# Patient Record
Sex: Female | Born: 1935 | Race: White | Hispanic: No | Smoking: Former smoker
Health system: Southern US, Community
[De-identification: ages and names within clinical notes are randomized; demographics above are authoritative.]

## PROBLEM LIST (undated history)

## (undated) DIAGNOSIS — R7303 Prediabetes: Secondary | ICD-10-CM

## (undated) DIAGNOSIS — E039 Hypothyroidism, unspecified: Secondary | ICD-10-CM

## (undated) DIAGNOSIS — M722 Plantar fascial fibromatosis: Secondary | ICD-10-CM

## (undated) DIAGNOSIS — K5790 Diverticulosis of intestine, part unspecified, without perforation or abscess without bleeding: Secondary | ICD-10-CM

## (undated) DIAGNOSIS — F419 Anxiety disorder, unspecified: Secondary | ICD-10-CM

## (undated) DIAGNOSIS — G629 Polyneuropathy, unspecified: Secondary | ICD-10-CM

## (undated) DIAGNOSIS — M1990 Unspecified osteoarthritis, unspecified site: Secondary | ICD-10-CM

## (undated) DIAGNOSIS — E611 Iron deficiency: Secondary | ICD-10-CM

## (undated) DIAGNOSIS — K56609 Unspecified intestinal obstruction, unspecified as to partial versus complete obstruction: Secondary | ICD-10-CM

## (undated) DIAGNOSIS — I10 Essential (primary) hypertension: Secondary | ICD-10-CM

## (undated) DIAGNOSIS — D649 Anemia, unspecified: Secondary | ICD-10-CM

## (undated) DIAGNOSIS — C541 Malignant neoplasm of endometrium: Secondary | ICD-10-CM

## (undated) DIAGNOSIS — K635 Polyp of colon: Secondary | ICD-10-CM

## (undated) DIAGNOSIS — K219 Gastro-esophageal reflux disease without esophagitis: Secondary | ICD-10-CM

## (undated) DIAGNOSIS — N183 Chronic kidney disease, stage 3 unspecified: Secondary | ICD-10-CM

## (undated) HISTORY — DX: Plantar fascial fibromatosis: M72.2

## (undated) HISTORY — PX: CATARACT EXTRACTION: SUR2

## (undated) HISTORY — DX: Diverticulosis of intestine, part unspecified, without perforation or abscess without bleeding: K57.90

## (undated) HISTORY — PX: EYE SURGERY: SHX253

## (undated) HISTORY — DX: Prediabetes: R73.03

## (undated) HISTORY — DX: Iron deficiency: E61.1

## (undated) HISTORY — DX: Polyneuropathy, unspecified: G62.9

## (undated) HISTORY — DX: Anemia, unspecified: D64.9

## (undated) HISTORY — DX: Polyp of colon: K63.5

## (undated) HISTORY — DX: Hypothyroidism, unspecified: E03.9

## (undated) HISTORY — DX: Unspecified intestinal obstruction, unspecified as to partial versus complete obstruction: K56.609

## (undated) HISTORY — DX: Essential (primary) hypertension: I10

---

## 1958-06-11 HISTORY — PX: OTHER SURGICAL HISTORY: SHX169

## 1963-06-12 HISTORY — PX: OTHER SURGICAL HISTORY: SHX169

## 1999-02-15 ENCOUNTER — Other Ambulatory Visit: Admission: RE | Admit: 1999-02-15 | Discharge: 1999-02-15 | Payer: Self-pay | Admitting: Ophthalmology

## 1999-07-12 ENCOUNTER — Ambulatory Visit (HOSPITAL_COMMUNITY): Admission: RE | Admit: 1999-07-12 | Discharge: 1999-07-12 | Payer: Self-pay | Admitting: Internal Medicine

## 1999-07-12 ENCOUNTER — Encounter: Payer: Self-pay | Admitting: Internal Medicine

## 2000-10-08 ENCOUNTER — Other Ambulatory Visit: Admission: RE | Admit: 2000-10-08 | Discharge: 2000-10-08 | Payer: Self-pay | Admitting: Obstetrics and Gynecology

## 2001-02-27 ENCOUNTER — Ambulatory Visit (HOSPITAL_COMMUNITY): Admission: RE | Admit: 2001-02-27 | Discharge: 2001-02-27 | Payer: Self-pay | Admitting: Internal Medicine

## 2001-02-27 ENCOUNTER — Encounter: Payer: Self-pay | Admitting: Internal Medicine

## 2002-01-05 ENCOUNTER — Other Ambulatory Visit: Admission: RE | Admit: 2002-01-05 | Discharge: 2002-01-05 | Payer: Self-pay | Admitting: Obstetrics and Gynecology

## 2002-11-20 ENCOUNTER — Encounter: Payer: Self-pay | Admitting: Obstetrics and Gynecology

## 2002-11-20 ENCOUNTER — Ambulatory Visit (HOSPITAL_COMMUNITY): Admission: RE | Admit: 2002-11-20 | Discharge: 2002-11-20 | Payer: Self-pay | Admitting: Obstetrics and Gynecology

## 2003-08-11 ENCOUNTER — Ambulatory Visit (HOSPITAL_COMMUNITY): Admission: RE | Admit: 2003-08-11 | Discharge: 2003-08-12 | Payer: Self-pay | Admitting: Ophthalmology

## 2005-01-30 ENCOUNTER — Other Ambulatory Visit: Admission: RE | Admit: 2005-01-30 | Discharge: 2005-01-30 | Payer: Self-pay | Admitting: Obstetrics and Gynecology

## 2006-02-13 ENCOUNTER — Ambulatory Visit (HOSPITAL_COMMUNITY): Admission: RE | Admit: 2006-02-13 | Discharge: 2006-02-13 | Payer: Self-pay | Admitting: Internal Medicine

## 2008-12-06 ENCOUNTER — Ambulatory Visit (HOSPITAL_COMMUNITY): Admission: RE | Admit: 2008-12-06 | Discharge: 2008-12-06 | Payer: Self-pay | Admitting: Internal Medicine

## 2010-01-24 ENCOUNTER — Ambulatory Visit (HOSPITAL_COMMUNITY): Admission: RE | Admit: 2010-01-24 | Discharge: 2010-01-24 | Payer: Self-pay | Admitting: Internal Medicine

## 2010-07-03 ENCOUNTER — Encounter: Payer: Self-pay | Admitting: Internal Medicine

## 2010-09-20 ENCOUNTER — Ambulatory Visit: Payer: Self-pay | Admitting: Internal Medicine

## 2010-09-21 ENCOUNTER — Encounter: Payer: Self-pay | Admitting: Internal Medicine

## 2010-10-27 NOTE — Op Note (Signed)
NAME:  Mikayla Harrison, Mikayla Harrison                             ACCOUNT NO.:  [id number]   MEDICAL RECORD NO.:  [id number]                   PATIENT TYPE:  OIB   LOCATION:  5729                                 FACILITY:  [facility]   PHYSICIAN:  Alford Highland. Rankin, M.D.                DATE OF BIRTH:  17-Nov-1931   DATE OF PROCEDURE:  08/11/2003  DATE OF DISCHARGE:                                 OPERATIVE REPORT   PREOPERATIVE DIAGNOSIS:  Macular hole, stage 3, right eye.   POSTOPERATIVE DIAGNOSIS:  Macular hole, stage 3, right eye.   PROCEDURE:  1. Posterior vitrectomy, membrane peel of the limiting membrane, right eye.  2. Injection of vitreous substitute, SF6 20%.   SURGEON:  Alford Highland. Rankin, M.D.   ANESTHESIA:  General endotracheal anesthesia.   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  This is an attempt to improve the macular hole, close the macular hole and  allow for visual acuity to improve.  The patient understands the risks of  anesthesia including the rare occurrence of death but also to the eye  including but not limited hemorrhage, infection, scarring, need for further  surgery, no change in vision, loss of vision and progression of disease  despite intervention.   DESCRIPTION OF PROCEDURE:  After appropriate signed consent was obtained,  the patient was taken to the operating room.  In the operating room general  orotracheal anesthesia was induced without difficulty.  The right  periorbital region was sterilely prepped and draped in the usual ophthalmic  fashion.  The lid speculum was applied.  The conjunctival peritomy was then  fashioned temporally, supranasally.  A 4 mm infusion was secured 3 mm  posterior to the limbus in the inferotemporal quadrant.  Placement in the  vitreous cavity was verified visually.  The superior sclerotomy was then  placed.  The microscope was  positioned with the Biom attached.  Core  vitrectomy was then begun.  Iatrogenic posterior vitreous attachments were  created.  With active dissection, I entered through the optic nerve without  difficulty and this was elevated anterior to the equator 360 degrees and  trimmed.   At this time, attention was drawn to the perimacular region followed by Dorc  serrated forceps were then used to engage the entire limiting membrane.  This was elevated in a continuous circular tear fashion around the macular  region.  This was a second continuous tear to enlarge this area to increase  mobilization to the macular region was confirmed.  Typical faint widening of  the intraretinal surface confirmed that the macula had internal limiting  membrane removed.  No complications occurred.  Excellent mobility of the  edge of the macula were identified.  Fluid-air exchange completed.  Air  dissection of  SF6 20% exchange completed.  The instruments were removed from  the eye and superior sclerotomies closed with 7-0 Vicryl.  The infusion was  removed and similarly closed.  Conjunctiva closed with 7-0 Vicryl.  Subconjunctival injection of only antibiotic was used as the patient has a  steroid allergy.   Sterile Fox shield applied.  The patient was taken to the recovery room in  good general condition.  She tolerated the procedure well without  complications.                                               Alford Highland Rankin, M.D.    GAR/MEDQ  D:  08/11/2003  T:  08/12/2003  Job:  [id number]

## 2010-11-10 ENCOUNTER — Ambulatory Visit (INDEPENDENT_AMBULATORY_CARE_PROVIDER_SITE_OTHER): Payer: Medicare PPO | Admitting: Internal Medicine

## 2010-11-10 ENCOUNTER — Other Ambulatory Visit (INDEPENDENT_AMBULATORY_CARE_PROVIDER_SITE_OTHER): Payer: Medicare PPO

## 2010-11-10 ENCOUNTER — Encounter: Payer: Self-pay | Admitting: Internal Medicine

## 2010-11-10 VITALS — BP 126/72 | HR 64 | Ht 65.0 in | Wt 173.0 lb

## 2010-11-10 DIAGNOSIS — K219 Gastro-esophageal reflux disease without esophagitis: Secondary | ICD-10-CM

## 2010-11-10 DIAGNOSIS — D509 Iron deficiency anemia, unspecified: Secondary | ICD-10-CM

## 2010-11-10 DIAGNOSIS — Z9283 Personal history of failed moderate sedation: Secondary | ICD-10-CM

## 2010-11-10 DIAGNOSIS — Z79899 Other long term (current) drug therapy: Secondary | ICD-10-CM

## 2010-11-10 LAB — IBC PANEL
Iron: 56 ug/dL (ref 42–145)
Saturation Ratios: 17.2 % — ABNORMAL LOW (ref 20.0–50.0)
Transferrin: 232.7 mg/dL (ref 212.0–360.0)

## 2010-11-10 LAB — FERRITIN: Ferritin: 48.4 ng/mL (ref 10.0–291.0)

## 2010-11-10 LAB — VITAMIN B12: Vitamin B-12: 382 pg/mL (ref 211–911)

## 2010-11-10 LAB — FOLATE: Folate: >24.8 ng/mL (ref >5.9)

## 2010-11-10 NOTE — Patient Instructions (Signed)
Call our office after checking your schedule to schedule your Colonoscopy/Endoscopy with Pro[facility].

## 2010-11-10 NOTE — Progress Notes (Signed)
HISTORY OF PRESENT ILLNESS:  Mikayla Harrison is a 77 y.o. female with the below listed medical history who presents today regarding new onset iron deficiency anemia. The patient was seen on one occasion in September 2004 as a direct referral for screening colonoscopy. She was found to have pandiverticulosis and a diminutive non-adenomatous colon polyp which was removed. Review of outside laboratories finds a hemoglobin of 10.9 in November of 2001. This was down from a baseline of 11.5. B12 and ferritin levels as well as MCV were normal. Iron saturation was 17%. Followup hemoglobin in February 2012 was 10.9. Iron saturation 9%. Her GI review of systems is remarkable for intermittent heartburn for which she takes Gaviscon. The remainder of the GI review of systems is entirely normal. Specifically, no melena, hematochezia, weight loss, abdominal pain, dysphagia, or change in bowel habits. No family history of colon cancer. She does take NSAIDs chronically for arthritic pain. Generally, at least 400 mg of ibuprofen daily. Increased doses recently for dental pain. No recent Hemoccults performed. She also takes a baby aspirin daily  REVIEW OF SYSTEMS:  All non-GI ROS negative except for back pain, sore throat,  ankle swelling, sinus and allergy trouble  Past Medical History  Diagnosis Date  . Hypertension   . Hyperlipidemia   . Hypothyroidism   . Anemia   . Iron deficiency     Past Surgical History  Procedure Date  . Cataract extraction   . Vericose vein 1960    Social History Mikayla Harrison  reports that she has quit smoking. She has never used smokeless tobacco. She reports that she does not drink alcohol or use illicit drugs.  family history includes Colon polyps in her sister; Diabetes in her sister; and Heart disease in her brother, father, and mother.  Allergies  Allergen Reactions  . Codeine   . Prednisone        PHYSICAL EXAMINATION: Vital signs: BP 126/72  Pulse 64  Ht 5\' 5"  (1.651  m)  Wt 173 lb (78.472 kg)  BMI 28.79 kg/m2  Constitutional: generally well-appearing, overweight, no acute distress Psychiatric: alert and oriented x3, cooperative Eyes: extraocular movements intact, anicteric, conjunctiva pink Mouth: oral pharynx moist, no lesions Neck: supple no lymphadenopathy Cardiovascular: heart regular rate and rhythm, no murmur Lungs: clear to auscultation bilaterally Abdomen: soft, nontender, nondistended, no obvious ascites, no peritoneal signs, normal bowel sounds, no organomegaly Rectal: Deferred until colonoscopy Extremities: no lower extremity edema bilaterally Skin: no lesions on visible extremities Neuro: No focal deficits.    ASSESSMENT:  #1. Normocytic anemia with low iron saturation. Rule out iron deficiency. Prior colonoscopy as described. Rule out GI mucosal lesion such as colonic neoplasia, AVMs, or NSAIDs-induced mucosal injury #2. GERD   PLAN:  #1. Colonoscopy.The nature of the procedure, as well as the risks, benefits, and alternatives were carefully and thoroughly reviewed with the patient. Ample time for discussion and questions allowed. The patient understood, was satisfied, and agreed to proceed. Movi prep prescribed. The patient instructed on each use #2. Upper endoscopy.The nature of the procedure, as well as the risks, benefits, and alternatives were carefully and thoroughly reviewed with the patient. Ample time for discussion and questions allowed. The patient understood, was satisfied, and agreed to proceed.  #3. Anemia panel #4. If the above workup unrevealing, then consider capsule endoscopy and/or hematology evaluation #5. Reflux precautions

## 2010-11-13 ENCOUNTER — Telehealth: Payer: Self-pay

## 2010-11-13 NOTE — Telephone Encounter (Signed)
Pt aware of results per Dr. Perry. 

## 2010-11-13 NOTE — Telephone Encounter (Signed)
Message copied by Michele Mcalpine on Mon Nov 13, 2010  9:24 AM ------      Message from: Hilarie Fredrickson      Created: Fri Nov 10, 2010  6:29 PM       The patient noted that her blood work looks fine. Normal vitamin levels

## 2010-12-04 ENCOUNTER — Telehealth: Payer: Self-pay | Admitting: Internal Medicine

## 2010-12-06 NOTE — Telephone Encounter (Signed)
Pt scheduled for previsit on 12/14/10@1pm , Propofol endo/colon scheduled for 12/26/10@8 :30am. Pt aware of appt dates and times.

## 2010-12-14 ENCOUNTER — Encounter: Payer: Self-pay | Admitting: Internal Medicine

## 2010-12-14 ENCOUNTER — Ambulatory Visit (AMBULATORY_SURGERY_CENTER): Payer: Medicare PPO | Admitting: *Deleted

## 2010-12-14 VITALS — Ht 66.0 in | Wt 174.4 lb

## 2010-12-14 DIAGNOSIS — D509 Iron deficiency anemia, unspecified: Secondary | ICD-10-CM

## 2010-12-14 DIAGNOSIS — K219 Gastro-esophageal reflux disease without esophagitis: Secondary | ICD-10-CM

## 2010-12-14 MED ORDER — MOVIPREP 100 G PO SOLR: ORAL | Status: DC

## 2010-12-26 ENCOUNTER — Encounter: Payer: Medicare PPO | Admitting: Internal Medicine

## 2011-01-09 ENCOUNTER — Ambulatory Visit (AMBULATORY_SURGERY_CENTER): Payer: Medicare PPO | Admitting: Internal Medicine

## 2011-01-09 ENCOUNTER — Encounter: Payer: Self-pay | Admitting: Internal Medicine

## 2011-01-09 DIAGNOSIS — Z1211 Encounter for screening for malignant neoplasm of colon: Secondary | ICD-10-CM

## 2011-01-09 DIAGNOSIS — K219 Gastro-esophageal reflux disease without esophagitis: Secondary | ICD-10-CM

## 2011-01-09 DIAGNOSIS — D509 Iron deficiency anemia, unspecified: Secondary | ICD-10-CM

## 2011-01-09 DIAGNOSIS — D126 Benign neoplasm of colon, unspecified: Secondary | ICD-10-CM

## 2011-01-09 MED ORDER — SODIUM CHLORIDE 0.9 % IV SOLN: 500.0000 mL | INTRAVENOUS | Status: DC

## 2011-01-09 NOTE — Progress Notes (Signed)
No complaints on discharge, MAW

## 2011-01-09 NOTE — Patient Instructions (Signed)
Refer to the picture page for finding from the colonoscopy and the upper endoscopy.  Please follow the green and blue discharge instruction sheets the rest of the day.  Resume your prior medications today.  Call if any questions or concerns.

## 2011-01-10 ENCOUNTER — Telehealth: Payer: Self-pay | Admitting: *Deleted

## 2011-01-10 NOTE — Telephone Encounter (Signed)
Attempts x 4, line is busy.

## 2012-01-13 ENCOUNTER — Inpatient Hospital Stay (HOSPITAL_COMMUNITY)
Admission: EM | Admit: 2012-01-13 | Discharge: 2012-01-15 | DRG: 390 | Disposition: A | Discharge status: Home / Self Care | Payer: Medicare PPO | Attending: Internal Medicine | Admitting: Internal Medicine

## 2012-01-13 ENCOUNTER — Encounter (HOSPITAL_COMMUNITY): Payer: Self-pay | Admitting: *Deleted

## 2012-01-13 ENCOUNTER — Emergency Department (HOSPITAL_COMMUNITY): Payer: Medicare PPO

## 2012-01-13 DIAGNOSIS — K56609 Unspecified intestinal obstruction, unspecified as to partial versus complete obstruction: Secondary | ICD-10-CM

## 2012-01-13 DIAGNOSIS — D509 Iron deficiency anemia, unspecified: Secondary | ICD-10-CM | POA: Diagnosis present

## 2012-01-13 DIAGNOSIS — D72829 Elevated white blood cell count, unspecified: Secondary | ICD-10-CM | POA: Diagnosis present

## 2012-01-13 DIAGNOSIS — R109 Unspecified abdominal pain: Secondary | ICD-10-CM

## 2012-01-13 DIAGNOSIS — K56600 Partial intestinal obstruction, unspecified as to cause: Secondary | ICD-10-CM

## 2012-01-13 DIAGNOSIS — E039 Hypothyroidism, unspecified: Secondary | ICD-10-CM | POA: Diagnosis present

## 2012-01-13 DIAGNOSIS — Z7982 Long term (current) use of aspirin: Secondary | ICD-10-CM

## 2012-01-13 DIAGNOSIS — E785 Hyperlipidemia, unspecified: Secondary | ICD-10-CM | POA: Diagnosis present

## 2012-01-13 DIAGNOSIS — I10 Essential (primary) hypertension: Secondary | ICD-10-CM | POA: Diagnosis present

## 2012-01-13 LAB — CBC WITH DIFFERENTIAL/PLATELET
Basophils Absolute: 0 10*3/uL (ref 0.0–0.1)
Basophils Relative: 0 % (ref 0–1)
Eosinophils Absolute: 0 10*3/uL (ref 0.0–0.7)
Eosinophils Relative: 0 % (ref 0–5)
HCT: 36.8 % (ref 36.0–46.0)
Hemoglobin: 12.1 g/dL (ref 12.0–15.0)
Lymphocytes Relative: 9 % — ABNORMAL LOW (ref 12–46)
Lymphs Abs: 1.2 10*3/uL (ref 0.7–4.0)
MCH: 28.6 pg (ref 26.0–34.0)
MCHC: 32.9 g/dL (ref 30.0–36.0)
MCV: 87 fL (ref 78.0–100.0)
Monocytes Absolute: 0.7 10*3/uL (ref 0.1–1.0)
Monocytes Relative: 5 % (ref 3–12)
Neutro Abs: 12.2 10*3/uL — ABNORMAL HIGH (ref 1.7–7.7)
Neutrophils Relative %: 86 % — ABNORMAL HIGH (ref 43–77)
Platelets: 225 10*3/uL (ref 150–400)
RBC: 4.23 MIL/uL (ref 3.87–5.11)
RDW: 13.5 % (ref 11.5–15.5)
WBC: 14.1 10*3/uL — ABNORMAL HIGH (ref 4.0–10.5)

## 2012-01-13 LAB — COMPREHENSIVE METABOLIC PANEL
Alkaline Phosphatase: 78 U/L (ref 39–117)
BUN: 15 mg/dL (ref 6–23)
Creatinine, Ser: 0.86 mg/dL (ref 0.50–1.10)
GFR calc Af Amer: 74 mL/min — ABNORMAL LOW (ref >90)
Glucose, Bld: 155 mg/dL — ABNORMAL HIGH (ref 70–99)
Potassium: 4.6 mEq/L (ref 3.5–5.1)
Total Protein: 6.5 g/dL (ref 6.0–8.3)

## 2012-01-13 LAB — URINALYSIS, ROUTINE W REFLEX MICROSCOPIC
Bilirubin Urine: NEGATIVE
Glucose, UA: NEGATIVE mg/dL
Ketones, ur: NEGATIVE mg/dL
Nitrite: NEGATIVE
Protein, ur: NEGATIVE mg/dL
Specific Gravity, Urine: 1.015 (ref 1.005–1.030)
Urobilinogen, UA: 0.2 mg/dL (ref 0.0–1.0)
pH: 6 (ref 5.0–8.0)

## 2012-01-13 LAB — COMPREHENSIVE METABOLIC PANEL WITH GFR
ALT: 13 U/L (ref 0–35)
AST: 26 U/L (ref 0–37)
Albumin: 3.1 g/dL — ABNORMAL LOW (ref 3.5–5.2)
CO2: 25 meq/L (ref 19–32)
Calcium: 8.6 mg/dL (ref 8.4–10.5)
Chloride: 105 meq/L (ref 96–112)
GFR calc non Af Amer: 64 mL/min — ABNORMAL LOW (ref >90)
Sodium: 139 meq/L (ref 135–145)
Total Bilirubin: 0.3 mg/dL (ref 0.3–1.2)

## 2012-01-13 LAB — URINE MICROSCOPIC-ADD ON

## 2012-01-13 LAB — LIPASE, BLOOD: Lipase: 14 U/L (ref 11–59)

## 2012-01-13 MED ORDER — ONDANSETRON HCL 4 MG/2ML IJ SOLN
4.0000 mg | Freq: Once | INTRAMUSCULAR | Status: AC
  Administered 2012-01-13: 4 mg via INTRAVENOUS
  Filled 2012-01-13: qty 2

## 2012-01-13 MED ORDER — SODIUM CHLORIDE 0.9 % IV BOLUS (SEPSIS)
500.0000 mL | Freq: Once | INTRAVENOUS | Status: AC
  Administered 2012-01-13: 500 mL via INTRAVENOUS

## 2012-01-13 NOTE — ED Provider Notes (Signed)
History     CSN: [id number]  Arrival date & time 01/13/12  1906   First MD Initiated Contact with Patient 01/13/12 2026      Chief Complaint  Patient presents with  . Abdominal Pain  . Nausea  . Emesis    (Consider location/radiation/quality/duration/timing/severity/associated sxs/prior treatment) Patient is a 78 y.o. female presenting with abdominal pain and vomiting. The history is provided by the patient.  Abdominal Pain The primary symptoms of the illness include abdominal pain, fatigue, nausea and vomiting. The primary symptoms of the illness do not include shortness of breath or diarrhea.  Symptoms associated with the illness do not include back pain.  Emesis  Associated symptoms include abdominal pain. Pertinent negatives include no diarrhea and no headaches.   patient has had nausea and vomiting since earlier this morning. 3 episodes right prior to getting to the ER but is improved since arrival. His no diarrhea. She's had some diffuse abdominal pain. No fevers. No previous abdominal surgery. She still has her appendix. No dysuria. No fevers. No sick contacts.  Past Medical History  Diagnosis Date  . Hypertension   . Hyperlipidemia   . Hypothyroidism   . Anemia   . Iron deficiency     Past Surgical History  Procedure Date  . Cataract extraction   . Vericose vein 1960  . Macular tear     Family History  Problem Relation Age of Onset  . Heart disease Mother   . Heart disease Father   . Colon polyps Sister   . Diabetes Sister   . Heart disease Brother     History  Substance Use Topics  . Smoking status: Former Smoker -- 1.0 packs/day for 15 years    Quit date: 06/11/1966  . Smokeless tobacco: Never Used  . Alcohol Use: No    OB History    Grav Para Term Preterm Abortions TAB SAB Ect Mult Living                  Review of Systems  Constitutional: Positive for fatigue. Negative for activity change and appetite change.  HENT: Negative for neck  stiffness.   Eyes: Negative for pain.  Respiratory: Negative for chest tightness and shortness of breath.   Cardiovascular: Negative for chest pain and leg swelling.  Gastrointestinal: Positive for nausea, vomiting and abdominal pain. Negative for diarrhea.  Genitourinary: Negative for flank pain.  Musculoskeletal: Negative for back pain.  Skin: Negative for rash.  Neurological: Negative for weakness, numbness and headaches.  Psychiatric/Behavioral: Negative for behavioral problems.    Allergies  Codeine and Prednisone  Home Medications   Current Outpatient Rx  Name Route Sig Dispense Refill  . ASPIRIN 81 MG PO TABS Oral Take 81 mg by mouth daily.      Marland Kitchen BENAZEPRIL HCL 40 MG PO TABS Oral Take 40 mg by mouth daily.    Marland Kitchen LEVOTHYROXINE SODIUM 150 MCG PO TABS Oral Take 150 mcg by mouth daily.      BP 122/73  Pulse 60  Temp 98.2 F (36.8 C) (Oral)  Resp 17  Ht 5\' 6"  (1.676 m)  Wt 175 lb 6.4 oz (79.561 kg)  BMI 28.31 kg/m2  SpO2 96%  Physical Exam  Nursing note and vitals reviewed. Constitutional: She is oriented to person, place, and time. She appears well-developed and well-nourished.  HENT:  Head: Normocephalic and atraumatic.  Eyes: EOM are normal. Pupils are equal, round, and reactive to light.  Neck: Normal range of motion. Neck  supple.  Cardiovascular: Normal rate, regular rhythm and normal heart sounds.   No murmur heard. Pulmonary/Chest: Effort normal and breath sounds normal. No respiratory distress. She has no wheezes. She has no rales.  Abdominal: Soft. Bowel sounds are normal. She exhibits no distension. There is tenderness. There is no rebound and no guarding.       Mild diffuse tenderness. Worse on the right side. No rebound or guarding. No hernias palpated  Musculoskeletal: Normal range of motion.  Neurological: She is alert and oriented to person, place, and time. No cranial nerve deficit.  Skin: Skin is warm and dry.  Psychiatric: She has a normal mood and  affect. Her speech is normal.    ED Course  Procedures (including critical care time)  Labs Reviewed  CBC WITH DIFFERENTIAL - Abnormal; Notable for the following:    WBC 14.1 (*)     Neutrophils Relative 86 (*)     Neutro Abs 12.2 (*)     Lymphocytes Relative 9 (*)     All other components within normal limits  COMPREHENSIVE METABOLIC PANEL - Abnormal; Notable for the following:    Glucose, Bld 155 (*)     Albumin 3.1 (*)     GFR calc non Af Amer 64 (*)     GFR calc Af Amer 74 (*)     All other components within normal limits  URINALYSIS, ROUTINE W REFLEX MICROSCOPIC - Abnormal; Notable for the following:    APPearance CLOUDY (*)     Hgb urine dipstick TRACE (*)     Leukocytes, UA SMALL (*)     All other components within normal limits  COMPREHENSIVE METABOLIC PANEL - Abnormal; Notable for the following:    Glucose, Bld 113 (*)     Albumin 3.4 (*)     GFR calc non Af Amer 61 (*)     GFR calc Af Amer 71 (*)     All other components within normal limits  CBC - Abnormal; Notable for the following:    WBC 13.6 (*)     All other components within normal limits  CBC - Abnormal; Notable for the following:    RBC 3.78 (*)     Hemoglobin 10.7 (*)     HCT 33.3 (*)     All other components within normal limits  BASIC METABOLIC PANEL - Abnormal; Notable for the following:    GFR calc non Af Amer 59 (*)     GFR calc Af Amer 69 (*)     All other components within normal limits  LIPASE, BLOOD  URINE MICROSCOPIC-ADD ON  TSH  LAB REPORT - SCANNED   No results found.   1. Partial small bowel obstruction   2. Abdominal pain   3. Hypertension   4. Hypothyroidism   5. Small bowel obstruction       MDM  Patient with abdominal pain and NV. Xray showed PSBO. Ct pending at time of turn over to Dr Stevphen Meuse R. Rubin Payor, MD 01/18/12 1410

## 2012-01-13 NOTE — ED Notes (Signed)
ION:[id number]<MW> Expected date:01/13/12<BR> Expected time: 7:11 PM<BR> Means of arrival:Ambulance<BR> Comments:<BR> EMS

## 2012-01-13 NOTE — ED Notes (Signed)
Pt has been attempted multiple times for labs unsuccessfully. Lab to come draw

## 2012-01-13 NOTE — ED Notes (Signed)
Per EMS: pt from home, onset of n/v since 1030am. Has not taken any otc meds.vitals 148/90, pulse 80

## 2012-01-13 NOTE — ED Notes (Signed)
Pt c/o N/V at 1030 this morning. Emesis was green yellow. Pt vomited 6x in past 24 hours, denies blood in emesis. Denies diarrhea. Pain in RUQ that radiates everywhere in abdomen. Pt denies taking anything out of the ordinary for breakfast or any new meds.

## 2012-01-14 ENCOUNTER — Encounter (HOSPITAL_COMMUNITY): Payer: Self-pay | Admitting: Internal Medicine

## 2012-01-14 DIAGNOSIS — E039 Hypothyroidism, unspecified: Secondary | ICD-10-CM

## 2012-01-14 DIAGNOSIS — I10 Essential (primary) hypertension: Secondary | ICD-10-CM

## 2012-01-14 DIAGNOSIS — K56609 Unspecified intestinal obstruction, unspecified as to partial versus complete obstruction: Secondary | ICD-10-CM

## 2012-01-14 LAB — COMPREHENSIVE METABOLIC PANEL WITH GFR
ALT: 14 U/L (ref 0–35)
AST: 24 U/L (ref 0–37)
Albumin: 3.4 g/dL — ABNORMAL LOW (ref 3.5–5.2)
Alkaline Phosphatase: 83 U/L (ref 39–117)
BUN: 14 mg/dL (ref 6–23)
Chloride: 104 meq/L (ref 96–112)
Potassium: 4.1 meq/L (ref 3.5–5.1)
Sodium: 138 meq/L (ref 135–145)
Total Bilirubin: 0.4 mg/dL (ref 0.3–1.2)
Total Protein: 6.7 g/dL (ref 6.0–8.3)

## 2012-01-14 LAB — CBC
HCT: 37.6 % (ref 36.0–46.0)
Hemoglobin: 12.4 g/dL (ref 12.0–15.0)
MCH: 28.6 pg (ref 26.0–34.0)
MCHC: 33 g/dL (ref 30.0–36.0)
MCV: 86.8 fL (ref 78.0–100.0)
Platelets: 227 10*3/uL (ref 150–400)
RBC: 4.33 MIL/uL (ref 3.87–5.11)
RDW: 13.5 % (ref 11.5–15.5)
WBC: 13.6 10*3/uL — ABNORMAL HIGH (ref 4.0–10.5)

## 2012-01-14 LAB — COMPREHENSIVE METABOLIC PANEL
CO2: 27 mEq/L (ref 19–32)
Calcium: 9 mg/dL (ref 8.4–10.5)
Creatinine, Ser: 0.89 mg/dL (ref 0.50–1.10)
GFR calc Af Amer: 71 mL/min — ABNORMAL LOW (ref >90)
GFR calc non Af Amer: 61 mL/min — ABNORMAL LOW (ref >90)
Glucose, Bld: 113 mg/dL — ABNORMAL HIGH (ref 70–99)

## 2012-01-14 LAB — TSH: TSH: 0.513 u[IU]/mL (ref 0.350–4.500)

## 2012-01-14 MED ORDER — MENTHOL 3 MG MT LOZG
1.0000 | LOZENGE | OROMUCOSAL | Status: DC | PRN
  Filled 2012-01-14: qty 9

## 2012-01-14 MED ORDER — LEVOTHYROXINE SODIUM 100 MCG IV SOLR
75.0000 ug | Freq: Every day | INTRAVENOUS | Status: DC
  Administered 2012-01-14 – 2012-01-15 (×2): 76 ug via INTRAVENOUS
  Filled 2012-01-14 (×2): qty 3.8

## 2012-01-14 MED ORDER — POTASSIUM CHLORIDE IN NACL 20-0.9 MEQ/L-% IV SOLN
INTRAVENOUS | Status: DC
  Filled 2012-01-14: qty 1000

## 2012-01-14 MED ORDER — IOHEXOL 300 MG/ML  SOLN
100.0000 mL | Freq: Once | INTRAMUSCULAR | Status: AC | PRN
  Administered 2012-01-14: 100 mL via INTRAVENOUS

## 2012-01-14 MED ORDER — PNEUMOCOCCAL VAC POLYVALENT 25 MCG/0.5ML IJ INJ
0.5000 mL | INJECTION | INTRAMUSCULAR | Status: DC
  Filled 2012-01-14: qty 0.5

## 2012-01-14 MED ORDER — MORPHINE SULFATE 2 MG/ML IJ SOLN
0.5000 mg | INTRAMUSCULAR | Status: DC | PRN
  Administered 2012-01-14 (×2): 0.5 mg via INTRAVENOUS
  Filled 2012-01-14 (×2): qty 1

## 2012-01-14 MED ORDER — POTASSIUM CHLORIDE IN NACL 20-0.9 MEQ/L-% IV SOLN
INTRAVENOUS | Status: AC
  Administered 2012-01-14: via INTRAVENOUS
  Administered 2012-01-14: 75 mL/h via INTRAVENOUS
  Filled 2012-01-14 (×2): qty 1000

## 2012-01-14 MED ORDER — SODIUM CHLORIDE 0.9 % IJ SOLN: 3.0000 mL | Freq: Two times a day (BID) | INTRAMUSCULAR | Status: DC

## 2012-01-14 MED ORDER — SODIUM CHLORIDE 0.9 % IV SOLN
INTRAVENOUS | Status: DC
  Administered 2012-01-14: 05:00:00 via INTRAVENOUS

## 2012-01-14 MED ORDER — CEPASTAT 14.5 MG MT LOZG
1.0000 | LOZENGE | OROMUCOSAL | Status: DC | PRN
  Filled 2012-01-14: qty 18

## 2012-01-14 MED ORDER — ACETAMINOPHEN 650 MG RE SUPP
650.0000 mg | Freq: Four times a day (QID) | RECTAL | Status: DC | PRN
  Administered 2012-01-14: 650 mg via RECTAL
  Filled 2012-01-14: qty 1

## 2012-01-14 NOTE — ED Notes (Signed)
MD at bedside. 

## 2012-01-14 NOTE — ED Provider Notes (Signed)
  Physical Exam  BP 103/61  Pulse 70  Temp 99.6 F (37.6 C) (Oral)  Resp 20  SpO2 98%  Physical Exam  ED Course  Procedures  MDM CT scan shows partial small bowel obstruction, patient is still symptomatic, admission requested from hospitalist who will see the patient in the emergency department. The patient did not tolerate NG tube placement      Vida Roller, MD 01/14/12 0222

## 2012-01-14 NOTE — Consult Note (Signed)
Reason for Consult:bowel obstruction Referring Physician: Barbee Harrison is an 78 y.o. female.  HPI: we were asked to evaluate this patient for possible small bowel obstruction. She says that she was feeling normal until early this morning after breakfast. She says she was on her way to church and she began feeling bad. She began having some diffuse abdominal pain which she says felt like "labor pains". This was followed by 3 episodes of severe vomiting which she says was a yellow green material and nonbloody. She also had a bowel movement this morning which was a small 1 but otherwise her bowels have been normal. And she said that she had a colonoscopy 6 months ago which showed some precancerous polyps which were removed. She has not noticed any abdominal distention or fevers or chills. Denies any other associated symptoms or relieving or exacerbating factors. Though her pain was produced severe this morning she denies any pain currently since receiving her pain medication and NG tube.she denies any prior abdominal surgery.  Past Medical History  Diagnosis Date  . Hypertension   . Hyperlipidemia   . Hypothyroidism   . Anemia   . Iron deficiency     Past Surgical History  Procedure Date  . Cataract extraction   . Vericose vein 1960  . Macular tear     Family History  Problem Relation Age of Onset  . Heart disease Mother   . Heart disease Father   . Colon polyps Sister   . Diabetes Sister   . Heart disease Brother     Social History:  reports that she quit smoking about 45 years ago. She has never used smokeless tobacco. She reports that she does not drink alcohol or use illicit drugs.  Allergies:  Allergies  Allergen Reactions  . Codeine Nausea Only  . Prednisone Nausea And Vomiting    Medications: I have reviewed the patient's current medications.  Results for orders placed during the hospital encounter of 01/13/12 (from the past 48 hour(s))  URINALYSIS, ROUTINE W  REFLEX MICROSCOPIC     Status: Abnormal   Collection Time   01/13/12  8:49 PM      Component Value Range Comment   Color, Urine YELLOW  YELLOW    APPearance CLOUDY (*) CLEAR    Specific Gravity, Urine 1.015  1.005 - 1.030    pH 6.0  5.0 - 8.0    Glucose, UA NEGATIVE  NEGATIVE mg/dL    Hgb urine dipstick TRACE (*) NEGATIVE    Bilirubin Urine NEGATIVE  NEGATIVE    Ketones, ur NEGATIVE  NEGATIVE mg/dL    Protein, ur NEGATIVE  NEGATIVE mg/dL    Urobilinogen, UA 0.2  0.0 - 1.0 mg/dL    Nitrite NEGATIVE  NEGATIVE    Leukocytes, UA SMALL (*) NEGATIVE   URINE MICROSCOPIC-ADD ON     Status: Normal   Collection Time   01/13/12  8:49 PM      Component Value Range Comment   Squamous Epithelial / LPF RARE  RARE    WBC, UA 3-6  <3 WBC/hpf   CBC WITH DIFFERENTIAL     Status: Abnormal   Collection Time   01/13/12 10:54 PM      Component Value Range Comment   WBC 14.1 (*) 4.0 - 10.5 K/uL    RBC 4.23  3.87 - 5.11 MIL/uL    Hemoglobin 12.1  12.0 - 15.0 g/dL    HCT 16.1  09.6 - 04.5 %  MCV 87.0  78.0 - 100.0 fL    MCH 28.6  26.0 - 34.0 pg    MCHC 32.9  30.0 - 36.0 g/dL    RDW 81.1  91.4 - 78.2 %    Platelets 225  150 - 400 K/uL    Neutrophils Relative 86 (*) 43 - 77 %    Neutro Abs 12.2 (*) 1.7 - 7.7 K/uL    Lymphocytes Relative 9 (*) 12 - 46 %    Lymphs Abs 1.2  0.7 - 4.0 K/uL    Monocytes Relative 5  3 - 12 %    Monocytes Absolute 0.7  0.1 - 1.0 K/uL    Eosinophils Relative 0  0 - 5 %    Eosinophils Absolute 0.0  0.0 - 0.7 K/uL    Basophils Relative 0  0 - 1 %    Basophils Absolute 0.0  0.0 - 0.1 K/uL   COMPREHENSIVE METABOLIC PANEL     Status: Abnormal   Collection Time   01/13/12 10:54 PM      Component Value Range Comment   Sodium 139  135 - 145 mEq/L    Potassium 4.6  3.5 - 5.1 mEq/L    Chloride 105  96 - 112 mEq/L    CO2 25  19 - 32 mEq/L    Glucose, Bld 155 (*) 70 - 99 mg/dL    BUN 15  6 - 23 mg/dL    Creatinine, Ser 9.56  0.50 - 1.10 mg/dL    Calcium 8.6  8.4 - 21.3 mg/dL     Total Protein 6.5  6.0 - 8.3 g/dL    Albumin 3.1 (*) 3.5 - 5.2 g/dL    AST 26  0 - 37 U/L NO VISIBLE HEMOLYSIS   ALT 13  0 - 35 U/L    Alkaline Phosphatase 78  39 - 117 U/L    Total Bilirubin 0.3  0.3 - 1.2 mg/dL    GFR calc non Af Amer 64 (*) >90 mL/min    GFR calc Af Amer 74 (*) >90 mL/min   LIPASE, BLOOD     Status: Normal   Collection Time   01/13/12 10:54 PM      Component Value Range Comment   Lipase 14  11 - 59 U/L   COMPREHENSIVE METABOLIC PANEL     Status: Abnormal   Collection Time   01/14/12  5:30 AM      Component Value Range Comment   Sodium 138  135 - 145 mEq/L    Potassium 4.1  3.5 - 5.1 mEq/L    Chloride 104  96 - 112 mEq/L    CO2 27  19 - 32 mEq/L    Glucose, Bld 113 (*) 70 - 99 mg/dL    BUN 14  6 - 23 mg/dL    Creatinine, Ser 0.86  0.50 - 1.10 mg/dL    Calcium 9.0  8.4 - 57.8 mg/dL    Total Protein 6.7  6.0 - 8.3 g/dL    Albumin 3.4 (*) 3.5 - 5.2 g/dL    AST 24  0 - 37 U/L    ALT 14  0 - 35 U/L    Alkaline Phosphatase 83  39 - 117 U/L    Total Bilirubin 0.4  0.3 - 1.2 mg/dL    GFR calc non Af Amer 61 (*) >90 mL/min    GFR calc Af Amer 71 (*) >90 mL/min   CBC     Status: Abnormal  Collection Time   01/14/12  5:30 AM      Component Value Range Comment   WBC 13.6 (*) 4.0 - 10.5 K/uL    RBC 4.33  3.87 - 5.11 MIL/uL    Hemoglobin 12.4  12.0 - 15.0 g/dL    HCT 34.7  42.5 - 95.6 %    MCV 86.8  78.0 - 100.0 fL    MCH 28.6  26.0 - 34.0 pg    MCHC 33.0  30.0 - 36.0 g/dL    RDW 38.7  56.4 - 33.2 %    Platelets 227  150 - 400 K/uL     Ct Abdomen Pelvis W Contrast  01/14/2012  *RADIOLOGY REPORT*  Clinical Data: Right upper quadrant pain.  Nausea and vomiting. White cell count 14.1.  Microhematuria.  CT ABDOMEN AND PELVIS WITH CONTRAST  Technique:  Multidetector CT imaging of the abdomen and pelvis was performed following the standard protocol during bolus administration of intravenous contrast.  Contrast: OMNIPAQUE IOHEXOL 300 MG/ML  SOLN  Comparison: None.   Findings: Mild dependent changes in the lung bases.  Small esophageal hiatal hernia.  Small amount of fluid in the upper abdomen around the liver with density measurements consistent with ascites.  Circumscribed low attenuation lesion in the inferior posterior right lobe of the liver measuring about 11 mm diameter, consistent with a cyst.  No solid liver mass is identified.  Minimal free fluid around the liver edge and right abdominal mesentery.  The gallbladder, spleen, and retroperitoneal lymph nodes are unremarkable.  The pancreas is diffusely atrophic.  Parapelvic cysts in the kidneys.  No solid mass or hydronephrosis.  The 13 mm nodule in the left adrenal gland with Hounsfield measurements of 68 on the portal phase and 32 on the delayed phase images.  This represents borderline washout, although by size criteria this is most likely to represent an adenoma.  No free air free fluid in the abdomen.  The stomach is not abnormally distended.  There is a mild fluid-filled dilatation of small bowel with transition zone in the right lower quadrant suggesting partial obstruction.  No obstructing mass is demonstrated, suggesting a obstruction is likely due to adhesions. Stool filled colon is not abnormally distended.  Pelvis:  Uterus and adnexal structures are not enlarged.  Minimal free fluid in the low pelvis.  Bladder wall is not thickened.  No loculated fluid collections.  No significant pelvic lymphadenopathy.  No evidence of diverticulitis.  The appendix is normal.  Normal alignment of the lumbar vertebrae.  IMPRESSION: Mild distension of fluid-filled small bowel with a small amount of free fluid.  Changes suggest partial small bowel obstruction, likely due to adhesions.  Probable cyst in the liver.  Left adrenal gland nodule is indeterminate but statistically likely to represent an adenoma.  Original Report Authenticated By: Marlon Pel, M.D.   Dg Abd Acute W/chest  01/13/2012  *RADIOLOGY REPORT*   Clinical Data: Nausea, vomiting, abdominal pain.  ACUTE ABDOMEN SERIES (ABDOMEN 2 VIEW & CHEST 1 VIEW)  Comparison: None.  Findings: Shallow inspiration.  Heart size and pulmonary vascularity are normal.  Linear fibrosis or atelectasis in the left lung base.  No focal airspace consolidation.  No blunting of costophrenic angles.  No pneumothorax.  Tortuous aorta.  Mild gas distension of left upper quadrant small bowel with a few small air-fluid levels suggesting early obstruction.  Stool filled colon without distension.  No free intra-abdominal air.  No radiopaque stones.  Pelvic calcifications consistent with  phleboliths.  IMPRESSION: Mild gaseous distension of left upper quadrant small bowel with a few air-fluid levels suggesting early small bowel obstruction.  Original Report Authenticated By: Marlon Pel, M.D.    All other review of systems negative or noncontributory except as stated in the HPI  Blood pressure 95/60, pulse 66, temperature 97.8 F (36.6 C), temperature source Oral, resp. rate 18, height 5\' 6"  (1.676 m), weight 174 lb 9.7 oz (79.2 kg), SpO2 97.00%. General appearance: alert, cooperative and no distress Head: Normocephalic, without obvious abnormality, atraumatic Neck: no JVD and supple, symmetrical, trachea midline Resp: nonlabored Cardio: normal rate GI: abdomen is soft, NT to my exam, mild distension (though she says that this is normal), no peritoneal signs, no hernias identified, NG in place Extremities: extremities normal, atraumatic, no cyanosis or edema Pulses: 2+ and symmetric Skin: Skin color, texture, turgor normal. No rashes or lesions Neurologic: Grossly normal  Assessment/Plan: Nausea and vomiting and abdominal pain This may be due to gastroenteritis versus partial bowel obstruction. She has not had any abdominal surgery previously and has no hernias on exam. She is current on her colonoscopy. Her NG tube was recently placed and has only a small amount of  thin yellow fluid. She is nontender on exam now and she says that she is nondistended and pain-free currently. If this is the partial bowel obstruction this appears to be fairly mild and I think nonoperative management is reasonable since this may be gastroenteritis as well. Her bowel is not very distended on CT scan and there is no evidence of any high-grade obstruction were closed-loop obstruction.  There is no reason to think that this is to malignancy. For now we will continue bowel rest and hydration and surgery will follow along.  Lodema Pilot DAVID 01/14/2012, 6:22 AM

## 2012-01-14 NOTE — ED Notes (Signed)
Attempted to call report RN busy

## 2012-01-14 NOTE — H&P (Signed)
[facility] History and Physical  Mikayla Harrison ZOX:[id number] DOB: 12-23-1931 DOA: 01/13/2012  Referring physician: Eber Hong PCP: Nadean Corwin, MD   Chief Complaint: abdominal pain  HPI:  78yo female with htn has c/o abdominal pain beginning yesterday am "all over her stomach" like labor pains,  "intermittent" , n/v,  No blood, denies fever, chills, diarrhea, constipation, brbpr, black stool, dysuria, hematuria. Pt came to ED due to her increased pain 9/10.  In ED, CT scan abd/pelvis=>partial sbo, with transition pt.  ED requests admission for sbo  Review of Systems:  Negative for all organ systems except for + above  Past Medical History  Diagnosis Date  . Hypertension   . Hyperlipidemia   . Hypothyroidism   . Anemia   . Iron deficiency    Past Surgical History  Procedure Date  . Cataract extraction   . Vericose vein 1960  . Macular tear    Social History:  reports that she quit smoking about 45 years ago. She has never used smokeless tobacco. She reports that she does not drink alcohol or use illicit drugs.  where does patient live--home Can patient participate in ADLs?  yes  Allergies  Allergen Reactions  . Codeine Nausea Only  . Prednisone Nausea And Vomiting    Family History  Problem Relation Age of Onset  . Heart disease Mother   . Heart disease Father   . Colon polyps Sister   . Diabetes Sister   . Heart disease Brother     (be sure to complete)  Prior to Admission medications   Medication Sig Start Date End Date Taking? Authorizing Provider  aspirin 81 MG tablet Take 81 mg by mouth daily.     Yes Historical Provider, MD   Physical Exam: Filed Vitals:   01/13/12 1915 01/13/12 1956 01/13/12 2322 01/13/12 2330  BP: 137/80 120/75 95/64 103/61  Pulse: 79 72 72 70  Temp:  99.6 F (37.6 C)    TempSrc:  Oral    Resp:  22 20   SpO2: 99% 100% 100% 98%     General:  Elderly female, nad  Eyes: anicteric  ENT: mmm  Neck: no jvd, no  bruit, no tm  Cardiovascular: rrr s1, s2  Respiratory: ctab  Abdomen: soft, nt, nd, +bs, slight distention  Skin: no rash,   Musculoskeletal: nl tone  Psychiatric: axox3  Neurologic: nonfocal  Labs on Admission:  Basic Metabolic Panel:  Lab 01/13/12 8119  NA 139  K 4.6  CL 105  CO2 25  GLUCOSE 155*  BUN 15  CREATININE 0.86  CALCIUM 8.6  MG --  PHOS --   Liver Function Tests:  Lab 01/13/12 2254  AST 26  ALT 13  ALKPHOS 78  BILITOT 0.3  PROT 6.5  ALBUMIN 3.1*    Lab 01/13/12 2254  LIPASE 14  AMYLASE --   No results found for this basename: AMMONIA:5 in the last 168 hours CBC:  Lab 01/13/12 2254  WBC 14.1*  NEUTROABS 12.2*  HGB 12.1  HCT 36.8  MCV 87.0  PLT 225   Cardiac Enzymes: No results found for this basename: CKTOTAL:5,CKMB:5,CKMBINDEX:5,TROPONINI:5 in the last 168 hours  BNP (last 3 results) No results found for this basename: PROBNP:3 in the last 8760 hours CBG: No results found for this basename: GLUCAP:5 in the last 168 hours  Radiological Exams on Admission: Ct Abdomen Pelvis W Contrast  01/14/2012  *RADIOLOGY REPORT*  Clinical Data: Right upper quadrant pain.  Nausea and vomiting.  White cell count 14.1.  Microhematuria.  CT ABDOMEN AND PELVIS WITH CONTRAST  Technique:  Multidetector CT imaging of the abdomen and pelvis was performed following the standard protocol during bolus administration of intravenous contrast.  Contrast: OMNIPAQUE IOHEXOL 300 MG/ML  SOLN  Comparison: None.  Findings: Mild dependent changes in the lung bases.  Small esophageal hiatal hernia.  Small amount of fluid in the upper abdomen around the liver with density measurements consistent with ascites.  Circumscribed low attenuation lesion in the inferior posterior right lobe of the liver measuring about 11 mm diameter, consistent with a cyst.  No solid liver mass is identified.  Minimal free fluid around the liver edge and right abdominal mesentery.  The  gallbladder, spleen, and retroperitoneal lymph nodes are unremarkable.  The pancreas is diffusely atrophic.  Parapelvic cysts in the kidneys.  No solid mass or hydronephrosis.  The 13 mm nodule in the left adrenal gland with Hounsfield measurements of 68 on the portal phase and 32 on the delayed phase images.  This represents borderline washout, although by size criteria this is most likely to represent an adenoma.  No free air free fluid in the abdomen.  The stomach is not abnormally distended.  There is a mild fluid-filled dilatation of small bowel with transition zone in the right lower quadrant suggesting partial obstruction.  No obstructing mass is demonstrated, suggesting a obstruction is likely due to adhesions. Stool filled colon is not abnormally distended.  Pelvis:  Uterus and adnexal structures are not enlarged.  Minimal free fluid in the low pelvis.  Bladder wall is not thickened.  No loculated fluid collections.  No significant pelvic lymphadenopathy.  No evidence of diverticulitis.  The appendix is normal.  Normal alignment of the lumbar vertebrae.  IMPRESSION: Mild distension of fluid-filled small bowel with a small amount of free fluid.  Changes suggest partial small bowel obstruction, likely due to adhesions.  Probable cyst in the liver.  Left adrenal gland nodule is indeterminate but statistically likely to represent an adenoma.  Original Report Authenticated By: Marlon Pel, M.D.   Dg Abd Acute W/chest  01/13/2012  *RADIOLOGY REPORT*  Clinical Data: Nausea, vomiting, abdominal pain.  ACUTE ABDOMEN SERIES (ABDOMEN 2 VIEW & CHEST 1 VIEW)  Comparison: None.  Findings: Shallow inspiration.  Heart size and pulmonary vascularity are normal.  Linear fibrosis or atelectasis in the left lung base.  No focal airspace consolidation.  No blunting of costophrenic angles.  No pneumothorax.  Tortuous aorta.  Mild gas distension of left upper quadrant small bowel with a few small air-fluid levels  suggesting early obstruction.  Stool filled colon without distension.  No free intra-abdominal air.  No radiopaque stones.  Pelvic calcifications consistent with phleboliths.  IMPRESSION: Mild gaseous distension of left upper quadrant small bowel with a few air-fluid levels suggesting early small bowel obstruction.  Original Report Authenticated By: Marlon Pel, M.D.    EKG:  Assessment/Plan Principal Problem:  *Small bowel obstruction Active Problems:  Abdominal pain  Hypertension  Hypothyroidism   1. Abdominal pain seconadry to partial sbo, npo, ns iv, pain control, surgery consult 2. Partial sbo,  Npo, ngt to low intermittent suction, surgery consult as stated above 3. Hypertension:  Please find out benazepril dose in am 4. Hypothyroidism: please find out synthroid dose in am  Code Status: Full code Family Communication:  Disposition Plan:   Time spent: 35 minutes  Pearson Grippe [facility] Pager [phone], tonight only  Otherwise [email or web address]  Password [email or web address] 01/14/2012, 2:34 AM

## 2012-01-14 NOTE — Progress Notes (Signed)
Surgery Attending Note:   Agree with Dr. Delice Lesch assessment.  24 hour history abdominal pain and repeated bilious, bu not feculent vomiting. No diarrhea. No prior episodes.\ NG in with minimal output. Pain resolved, patient told RN she wants to go home.  On exam, abdomen is soft and non-tender, hypoactive bowel sounds, ? slightly distended, no mass,, no hernia, no scars.  Assessment:  Partial SBO  Vs,. Gastroenteritis. No evidence for compromised bowel.                         No prior abdominal surgery  Plan:                Bowel rest and NG.                         Increase IVF's and add KCL                         Lab and xray tomorrow.                         Ambulate   Angelia Mould. Derrell Lolling, M.D., [facility] General and Minimally invasive Surgery Breast and Colorectal Surgery Office:   [phone] Pager:   [phone]

## 2012-01-14 NOTE — Progress Notes (Signed)
NG tube placed at 05:15 am. Patient tolerated procedure well.  Will continue to monitor patient. Manson Passey, Chaundra Abreu Cherie

## 2012-01-14 NOTE — Progress Notes (Signed)
[facility]                                                                                Patient Demographics  Mikayla Harrison, is a 78 y.o. female  Mikayla Harrison  RUE:[id number]  DOB - 01/01/1932  Admit date - 01/13/2012  Admitting Physician Massie Maroon, MD  Outpatient Primary MD for the patient is Nadean Corwin, MD  LOS - 1   Chief Complaint  Patient presents with  . Abdominal Pain  . Nausea  . Emesis        Assessment & Plan    1. Small bowel obstruction due to adhesions on CT scan- etiology unclear patient has had no previous surgeries, is pain-free, however is not passing flatus, does have NG tube, general surgery is following the patient, continue to provide bowel rest, IV fluids and monitor, the KUB in the morning.    2. History of  Hypertension -no acute issues currently blood pressure is soft, gentle IV fluids hold antihypertensive medications.    3. Hypothyroidism - switched to IV Synthroid as she is n.p.o, Check TSH.    4. Mild leukocytosis - is likely reactionary from #1 above, UA chest x-ray unremarkable, patient is afebrile. Will monitor clinically.   Code Status: Full  Family Communication: discussed with the  Disposition Plan: Home    Procedures CT abdomen and pelvis   Consults  urgently   Antibiotics  none   Time Spent in minutes  25   DVT Prophylaxis  SCDs   Susa Raring K M.D on 01/14/2012 at 9:44 AM  Between 7am to 7pm - Pager - [phone]  After 7pm go to [email or web address] - password [email or web address]  And look for the night coverage person covering for me after hours  [facility] Office  [phone]    Subjective:   Mikayla Harrison today has, No headache, No chest pain, No abdominal pain - No Nausea, No new weakness tingling or numbness, No Cough - SOB. Is not passing flatus yet.  Objective:   Filed Vitals:   01/13/12 2330 01/14/12 0230 01/14/12 0330 01/14/12 0413  BP: 103/61 109/59 91/43 95/60     Pulse: 70 75 68 66  Temp:    97.8 F (36.6 C)  TempSrc:    Oral  Resp:    18  Height:    5\' 6"  (1.676 m)  Weight:    79.2 kg (174 lb 9.7 oz)  SpO2: 98% 97% 97% 97%    Wt Readings from Last 3 Encounters:  01/14/12 79.2 kg (174 lb 9.7 oz)  01/09/11 77.111 kg (170 lb)  12/14/10 79.107 kg (174 lb 6.4 oz)     Intake/Output Summary (Last 24 hours) at 01/14/12 0944 Last data filed at 01/14/12 0700  Gross per 24 hour  Intake 241.67 ml  Output      0 ml  Net 241.67 ml    Exam Awake Alert, Oriented X 3, No new F.N deficits, Normal affect [place].AT,PERRAL Supple Neck,No JVD, No cervical lymphadenopathy appriciated.  Symmetrical Chest wall movement, Good air movement bilaterally, CTAB RRR,No Gallops,Rubs or new Murmurs, No Parasternal Heave minimal B.Sounds , Abd Soft, Non tender, No organomegaly appriciated, No rebound -  guarding or rigidity. Has NG tube in place. No Cyanosis, Clubbing or edema, No new Rash or bruise      Data Review   CBC  Lab 01/14/12 0530 01/13/12 2254  WBC 13.6* 14.1*  HGB 12.4 12.1  HCT 37.6 36.8  PLT 227 225  MCV 86.8 87.0  MCH 28.6 28.6  MCHC 33.0 32.9  RDW 13.5 13.5  LYMPHSABS -- 1.2  MONOABS -- 0.7  EOSABS -- 0.0  BASOSABS -- 0.0  BANDABS -- --    Chemistries   Lab 01/14/12 0530 01/13/12 2254  NA 138 139  K 4.1 4.6  CL 104 105  CO2 27 25  GLUCOSE 113* 155*  BUN 14 15  CREATININE 0.89 0.86  CALCIUM 9.0 8.6  MG -- --  AST 24 26  ALT 14 13  ALKPHOS 83 78  BILITOT 0.4 0.3   ------------------------------------------------------------------------------------------------------------------ estimated creatinine clearance is 56.2 ml/min (by C-G formula based on Cr of 0.89). ------------------------------------------------------------------------------------------------------------------ No results found for this basename: HGBA1C:2 in the last 72  hours ------------------------------------------------------------------------------------------------------------------ No results found for this basename: CHOL:2,HDL:2,LDLCALC:2,TRIG:2,CHOLHDL:2,LDLDIRECT:2 in the last 72 hours ------------------------------------------------------------------------------------------------------------------ No results found for this basename: TSH,T4TOTAL,FREET3,T3FREE,THYROIDAB in the last 72 hours ------------------------------------------------------------------------------------------------------------------ No results found for this basename: VITAMINB12:2,FOLATE:2,FERRITIN:2,TIBC:2,IRON:2,RETICCTPCT:2 in the last 72 hours  Coagulation profile No results found for this basename: INR:5,PROTIME:5 in the last 168 hours  No results found for this basename: DDIMER:2 in the last 72 hours  Cardiac Enzymes No results found for this basename: CK:3,CKMB:3,TROPONINI:3,MYOGLOBIN:3 in the last 168 hours ------------------------------------------------------------------------------------------------------------------ No components found with this basename: POCBNP:3  Micro Results No results found for this or any previous visit (from the past 240 hour(s)).  Radiology Reports Ct Abdomen Pelvis W Contrast  01/14/2012  *RADIOLOGY REPORT*  Clinical Data: Right upper quadrant pain.  Nausea and vomiting. White cell count 14.1.  Microhematuria.  CT ABDOMEN AND PELVIS WITH CONTRAST  Technique:  Multidetector CT imaging of the abdomen and pelvis was performed following the standard protocol during bolus administration of intravenous contrast.  Contrast: OMNIPAQUE IOHEXOL 300 MG/ML  SOLN  Comparison: None.  Findings: Mild dependent changes in the lung bases.  Small esophageal hiatal hernia.  Small amount of fluid in the upper abdomen around the liver with density measurements consistent with ascites.  Circumscribed low attenuation lesion in the inferior posterior right lobe  of the liver measuring about 11 mm diameter, consistent with a cyst.  No solid liver mass is identified.  Minimal free fluid around the liver edge and right abdominal mesentery.  The gallbladder, spleen, and retroperitoneal lymph nodes are unremarkable.  The pancreas is diffusely atrophic.  Parapelvic cysts in the kidneys.  No solid mass or hydronephrosis.  The 13 mm nodule in the left adrenal gland with Hounsfield measurements of 68 on the portal phase and 32 on the delayed phase images.  This represents borderline washout, although by size criteria this is most likely to represent an adenoma.  No free air free fluid in the abdomen.  The stomach is not abnormally distended.  There is a mild fluid-filled dilatation of small bowel with transition zone in the right lower quadrant suggesting partial obstruction.  No obstructing mass is demonstrated, suggesting a obstruction is likely due to adhesions. Stool filled colon is not abnormally distended.  Pelvis:  Uterus and adnexal structures are not enlarged.  Minimal free fluid in the low pelvis.  Bladder wall is not thickened.  No loculated fluid collections.  No significant pelvic lymphadenopathy.  No evidence  of diverticulitis.  The appendix is normal.  Normal alignment of the lumbar vertebrae.  IMPRESSION: Mild distension of fluid-filled small bowel with a small amount of free fluid.  Changes suggest partial small bowel obstruction, likely due to adhesions.  Probable cyst in the liver.  Left adrenal gland nodule is indeterminate but statistically likely to represent an adenoma.  Original Report Authenticated By: Marlon Pel, M.D.   Dg Abd Acute W/chest  01/13/2012  *RADIOLOGY REPORT*  Clinical Data: Nausea, vomiting, abdominal pain.  ACUTE ABDOMEN SERIES (ABDOMEN 2 VIEW & CHEST 1 VIEW)  Comparison: None.  Findings: Shallow inspiration.  Heart size and pulmonary vascularity are normal.  Linear fibrosis or atelectasis in the left lung base.  No focal airspace  consolidation.  No blunting of costophrenic angles.  No pneumothorax.  Tortuous aorta.  Mild gas distension of left upper quadrant small bowel with a few small air-fluid levels suggesting early obstruction.  Stool filled colon without distension.  No free intra-abdominal air.  No radiopaque stones.  Pelvic calcifications consistent with phleboliths.  IMPRESSION: Mild gaseous distension of left upper quadrant small bowel with a few air-fluid levels suggesting early small bowel obstruction.  Original Report Authenticated By: Marlon Pel, M.D.    Scheduled Meds:   . levothyroxine  50 mcg Intravenous Daily  . ondansetron  4 mg Intravenous Once  . pneumococcal 23 valent vaccine  0.5 mL Intramuscular Tomorrow-1000  . sodium chloride  500 mL Intravenous Once  . sodium chloride  3 mL Intravenous Q12H   Continuous Infusions:   . 0.9 % NaCl with KCl 20 mEq / L    . DISCONTD: sodium chloride 100 mL/hr at 01/14/12 0435  . DISCONTD: 0.9 % NaCl with KCl 20 mEq / L     PRN Meds:.iohexol, menthol-cetylpyridinium, morphine injection, DISCONTD: phenol-menthol

## 2012-01-15 ENCOUNTER — Inpatient Hospital Stay (HOSPITAL_COMMUNITY): Payer: Medicare PPO

## 2012-01-15 DIAGNOSIS — R109 Unspecified abdominal pain: Secondary | ICD-10-CM

## 2012-01-15 DIAGNOSIS — I10 Essential (primary) hypertension: Secondary | ICD-10-CM

## 2012-01-15 DIAGNOSIS — K56609 Unspecified intestinal obstruction, unspecified as to partial versus complete obstruction: Principal | ICD-10-CM

## 2012-01-15 DIAGNOSIS — E039 Hypothyroidism, unspecified: Secondary | ICD-10-CM

## 2012-01-15 LAB — CBC
HCT: 33.3 % — ABNORMAL LOW (ref 36.0–46.0)
Hemoglobin: 10.7 g/dL — ABNORMAL LOW (ref 12.0–15.0)
MCH: 28.3 pg (ref 26.0–34.0)
MCHC: 32.1 g/dL (ref 30.0–36.0)
MCV: 88.1 fL (ref 78.0–100.0)
Platelets: 177 10*3/uL (ref 150–400)
RBC: 3.78 MIL/uL — ABNORMAL LOW (ref 3.87–5.11)
RDW: 13.7 % (ref 11.5–15.5)
WBC: 9 10*3/uL (ref 4.0–10.5)

## 2012-01-15 LAB — BASIC METABOLIC PANEL WITH GFR
BUN: 16 mg/dL (ref 6–23)
CO2: 24 meq/L (ref 19–32)
Calcium: 8.4 mg/dL (ref 8.4–10.5)
Creatinine, Ser: 0.91 mg/dL (ref 0.50–1.10)
GFR calc non Af Amer: 59 mL/min — ABNORMAL LOW (ref >90)
Glucose, Bld: 80 mg/dL (ref 70–99)
Sodium: 140 meq/L (ref 135–145)

## 2012-01-15 LAB — BASIC METABOLIC PANEL
Chloride: 108 mEq/L (ref 96–112)
GFR calc Af Amer: 69 mL/min — ABNORMAL LOW (ref >90)
Potassium: 4 mEq/L (ref 3.5–5.1)

## 2012-01-15 NOTE — Discharge Summary (Signed)
[facility]                                                                                   Mikayla Harrison, is a 78 y.o. female  DOB 1932-02-02  MRN [id number].  Admission date:  01/13/2012  Discharge Date:  01/15/2012  Primary MD  Nadean Corwin, MD  Admitting Physician  Massie Maroon, MD  Admission Diagnosis  Partial small bowel obstruction [560.9] abdominal pain  Discharge Diagnosis     Principal Problem:  *Small bowel obstruction Active Problems:  Abdominal pain  Hypertension  Hypothyroidism   Past Medical History  Diagnosis Date  . Hypertension   . Hyperlipidemia   . Hypothyroidism   . Anemia   . Iron deficiency     Past Surgical History  Procedure Date  . Cataract extraction   . Vericose vein 1960  . Macular tear      Recommendations for primary care physician for things to follow:   Follow her GI status closely, ? Why did she get SBO, consider outpt GI follow up.   Discharge Diagnoses:   Principal Problem:  *Small bowel obstruction Active Problems:  Abdominal pain  Hypertension  Hypothyroidism    Discharge Condition: Stable   Diet recommendation: See Discharge Instructions below   Consults Surgery   History of present illness and  Hospital Course:  See H&P, Labs, Consult and Test reports for all details in brief, patient was admitted for  nausea small bowel obstruction versus gastroenteritis noted on CT, she had presented with abdominal pain nausea and vomiting, was treated with bowel rest NG tube for decompression and was seen by general surgery.  Patient within 24 hours had a bowel movement and was passing flat, this morning completely symptom-free, seen by general surgery cleared for discharge, NG tube has been removed she has tolerated soft diet without any problems, does not want to stay anymore to have proper food, he wants to do that at home, says she wants to go home right now.  Request primary care physician to  consider one time outpatient GI followup as to why patient had SBO if it was SBO.      Today   Subjective:   [name] today has no headache,no chest abdominal pain,no new weakness tingling or numbness, feels much better wants to go home today   Objective:   Blood pressure 122/73, pulse 60, temperature 98.2 F (36.8 C), temperature source Oral, resp. rate 17, height 5\' 6"  (1.676 m), weight 79.561 kg (175 lb 6.4 oz), SpO2 96.00%.   Intake/Output Summary (Last 24 hours) at 01/15/12 1036 Last data filed at 01/15/12 0700  Gross per 24 hour  Intake 1586.25 ml  Output    550 ml  Net 1036.25 ml    Exam Awake Alert, Oriented *3, No new F.N deficits, Normal affect [place].AT,PERRAL Supple Neck,No JVD, No cervical lymphadenopathy appriciated.  Symmetrical Chest wall movement, Good air movement bilaterally, CTAB RRR,No Gallops,Rubs or new Murmurs, No Parasternal Heave +ve B.Sounds, Abd Soft, Non tender, No organomegaly appriciated, No rebound -guarding or rigidity. No Cyanosis, Clubbing or edema, No new Rash or bruise  Data Review  Ct Abdomen Pelvis W Contrast  01/14/2012  *RADIOLOGY REPORT*  Clinical Data: Right upper quadrant pain.  Nausea and vomiting. White cell count 14.1.  Microhematuria.  CT ABDOMEN AND PELVIS WITH CONTRAST  Technique:  Multidetector CT imaging of the abdomen and pelvis was performed following the standard protocol during bolus administration of intravenous contrast.  Contrast: OMNIPAQUE IOHEXOL 300 MG/ML  SOLN  Comparison: None.  Findings: Mild dependent changes in the lung bases.  Small esophageal hiatal hernia.  Small amount of fluid in the upper abdomen around the liver with density measurements consistent with ascites.  Circumscribed low attenuation lesion in the inferior posterior right lobe of the liver measuring about 11 mm diameter, consistent with a cyst.  No solid liver mass is identified.  Minimal free fluid around the liver edge and right abdominal  mesentery.  The gallbladder, spleen, and retroperitoneal lymph nodes are unremarkable.  The pancreas is diffusely atrophic.  Parapelvic cysts in the kidneys.  No solid mass or hydronephrosis.  The 13 mm nodule in the left adrenal gland with Hounsfield measurements of 68 on the portal phase and 32 on the delayed phase images.  This represents borderline washout, although by size criteria this is most likely to represent an adenoma.  No free air free fluid in the abdomen.  The stomach is not abnormally distended.  There is a mild fluid-filled dilatation of small bowel with transition zone in the right lower quadrant suggesting partial obstruction.  No obstructing mass is demonstrated, suggesting a obstruction is likely due to adhesions. Stool filled colon is not abnormally distended.  Pelvis:  Uterus and adnexal structures are not enlarged.  Minimal free fluid in the low pelvis.  Bladder wall is not thickened.  No loculated fluid collections.  No significant pelvic lymphadenopathy.  No evidence of diverticulitis.  The appendix is normal.  Normal alignment of the lumbar vertebrae.  IMPRESSION: Mild distension of fluid-filled small bowel with a small amount of free fluid.  Changes suggest partial small bowel obstruction, likely due to adhesions.  Probable cyst in the liver.  Left adrenal gland nodule is indeterminate but statistically likely to represent an adenoma.  Original Report Authenticated By: Marlon Pel, M.D.   Dg Abd 2 Views  01/15/2012  *RADIOLOGY REPORT*  Clinical Data: Evaluate small bowel obstruction versus ileus  ABDOMEN - 2 VIEW  Comparison: 01/13/2012; abdominal CT - 01/14/2012  Findings:  Interval passage of previously ingested enteric contrast, now within the cecum and ascending colon.  No definite evidence of obstruction.  No pneumoperitoneum, pneumatosis or portal venous gas.  An enteric tube projects over the expected location of the gastric fundus.  No definite abnormal intra-abdominal  calcifications.  Limited visualization of the lower thorax suggests mild enlargement the cardiac silhouette and bibasilar opacities, possibly atelectasis.  No acute osseous abnormalities.  IMPRESSION:  Interval passage of ingested enteric contrast into the cecum and ascending colon.  No definite evidence of obstruction or ileus.  Original Report Authenticated By: Waynard Reeds, M.D.   Dg Abd Acute W/chest  01/13/2012  *RADIOLOGY REPORT*  Clinical Data: Nausea, vomiting, abdominal pain.  ACUTE ABDOMEN SERIES (ABDOMEN 2 VIEW & CHEST 1 VIEW)  Comparison: None.  Findings: Shallow inspiration.  Heart size and pulmonary vascularity are normal.  Linear fibrosis or atelectasis in the left lung base.  No focal airspace consolidation.  No blunting of costophrenic angles.  No pneumothorax.  Tortuous aorta.  Mild gas distension of left upper quadrant small bowel with  a few small air-fluid levels suggesting early obstruction.  Stool filled colon without distension.  No free intra-abdominal air.  No radiopaque stones.  Pelvic calcifications consistent with phleboliths.  IMPRESSION: Mild gaseous distension of left upper quadrant small bowel with a few air-fluid levels suggesting early small bowel obstruction.  Original Report Authenticated By: Marlon Pel, M.D.    Micro Results    CBC w Diff: Lab Results  Component Value Date   WBC 9.0 01/15/2012   HGB 10.7* 01/15/2012   HCT 33.3* 01/15/2012   PLT 177 01/15/2012   LYMPHOPCT 9* 01/13/2012   MONOPCT 5 01/13/2012   EOSPCT 0 01/13/2012   BASOPCT 0 01/13/2012    CMP: Lab Results  Component Value Date   NA 140 01/15/2012   K 4.0 01/15/2012   CL 108 01/15/2012   CO2 24 01/15/2012   BUN 16 01/15/2012   CREATININE 0.91 01/15/2012   PROT 6.7 01/14/2012   ALBUMIN 3.4* 01/14/2012   BILITOT 0.4 01/14/2012   ALKPHOS 83 01/14/2012   AST 24 01/14/2012   ALT 14 01/14/2012  .   Discharge Instructions     Follow with Primary MD Lucky Cowboy DAVID, MD in 3 days   Get CBC, CMP, checked 3  days by Primary MD and again as instructed by your Primary MD.    Get Medicines reviewed and adjusted.  Please request your Prim.MD to go over all Hospital Tests and Procedure/Radiological results at the follow up, please get all Hospital records sent to your Prim MD by signing hospital release before you go home.  Activity: As tolerated with Full fall precautions use walker/cane & assistance as needed   Diet: Heart Healthy  For Heart failure patients - Check your Weight same time everyday, if you gain over 2 pounds, or you develop in leg swelling, experience more shortness of breath or chest pain, call your Primary MD immediately. Follow Cardiac Low Salt Diet and 1.8 lit/day fluid restriction.  Disposition Home    If you experience worsening of your admission symptoms, develop shortness of breath, life threatening emergency, suicidal or homicidal thoughts you must seek medical attention immediately by calling 911 or calling your MD immediately  if symptoms less severe.  You Must read complete instructions/literature along with all the possible adverse reactions/side effects for all the Medicines you take and that have been prescribed to you. Take any new Medicines after you have completely understood and accpet all the possible adverse reactions/side effects.   Do not drive if your were admitted for syncope or siezures until you have seen by Primary MD or a Neurologist and advised to drive.  Do not drive when taking Pain medications.    Do not take more than prescribed Pain, Sleep and Anxiety Medications  Special Instructions: If you have smoked or chewed Tobacco  in the last 2 yrs please stop smoking, stop any regular Alcohol  and or any Recreational drug use.  Wear Seat belts while driving.  Follow-up Information    Schedule an appointment as soon as possible for a visit with Nadean Corwin, MD.   Contact information:   [address] [place]  [id number] [phone]       Follow up with Ernestene Mention, MD. Schedule an appointment as soon as possible for a visit in 2 weeks.   Contact information:   [address] [place] [address] [phone]            Discharge Medications  Medication List  As of 01/15/2012 10:36 AM   CONTINUE taking these medications         aspirin 81 MG tablet      benazepril 40 MG tablet   Commonly known as: LOTENSIN      levothyroxine 150 MCG tablet   Commonly known as: SYNTHROID, LEVOTHROID               Total Time in preparing paper work, data evaluation and todays exam - 35 minutes  Susa Raring K M.D on 01/15/2012 at 10:36 AM  [facility] Office  [phone]

## 2012-01-15 NOTE — Care Management Note (Signed)
    Page 1 of 1   01/15/2012     10:54:47 AM   CARE MANAGEMENT NOTE 01/15/2012  Patient:  Mikayla Harrison, Mikayla Harrison   Account Number:  [id number]  Date Initiated:  01/15/2012  Documentation initiated by:  Lanier Clam  Subjective/Objective Assessment:   ADMITTED W/ABD PAIN.     Action/Plan:   FROM HOME   Anticipated DC Date:  01/15/2012   Anticipated DC Plan:  HOME/SELF CARE      DC Planning Services  CM consult      Choice offered to / List presented to:             Status of service:  Completed, signed off Medicare Important Message given?   (If response is "NO", the following Medicare IM given date fields will be blank) Date Medicare IM given:   Date Additional Medicare IM given:    Discharge Disposition:  HOME/SELF CARE  Per UR Regulation:  Reviewed for med. necessity/level of care/duration of stay  If discussed at Long Length of Stay Meetings, dates discussed:    Comments:

## 2012-01-15 NOTE — Discharge Instructions (Signed)
 Follow with Primary MD MCKEOWN,WILLIAM DAVID, MD in 3 days   Get CBC, CMP, checked 3 days by Primary MD and again as instructed by your Primary MD.    Get Medicines reviewed and adjusted.  Please request your Prim.MD to go over all Hospital Tests and Procedure/Radiological results at the follow up, please get all Hospital records sent to your Prim MD by signing hospital release before you go home.  Activity: As tolerated with Full fall precautions use walker/cane & assistance as needed   Diet: Heart Healthy  For Heart failure patients - Check your Weight same time everyday, if you gain over 2 pounds, or you develop in leg swelling, experience more shortness of breath or chest pain, call your Primary MD immediately. Follow Cardiac Low Salt Diet and 1.8 lit/day fluid restriction.  Disposition Home    If you experience worsening of your admission symptoms, develop shortness of breath, life threatening emergency, suicidal or homicidal thoughts you must seek medical attention immediately by calling 911 or calling your MD immediately  if symptoms less severe.  You Must read complete instructions/literature along with all the possible adverse reactions/side effects for all the Medicines you take and that have been prescribed to you. Take any new Medicines after you have completely understood and accpet all the possible adverse reactions/side effects.   Do not drive if your were admitted for syncope or siezures until you have seen by Primary MD or a Neurologist and advised to drive.  Do not drive when taking Pain medications.    Do not take more than prescribed Pain, Sleep and Anxiety Medications  Special Instructions: If you have smoked or chewed Tobacco  in the last 2 yrs please stop smoking, stop any regular Alcohol  and or any Recreational drug use.  Wear Seat belts while driving.

## 2012-01-15 NOTE — Progress Notes (Signed)
Subjective: Patient states that she feels well other than left throat pain from NG tube. No nausea. Had a bowel movement and passing flatus. Was to take the NG tube out, absent eat and go home today.  Abdominal x-ray showed that the obstructive gas pattern has completely resolved and the contrast is noted in the colon.  Wbc now normalized.  Objective: Vital signs in last 24 hours: Temp:  [98.1 F (36.7 C)-98.5 F (36.9 C)] 98.2 F (36.8 C) (08/06 0504) Pulse Rate:  [60-63] 60  (08/06 0504) Resp:  [17-18] 17  (08/06 0504) BP: (109-122)/(68-73) 122/73 mmHg (08/06 0504) SpO2:  [95 %-96 %] 96 % (08/06 0504) Weight:  [175 lb 6.4 oz (79.561 kg)] 175 lb 6.4 oz (79.561 kg) (08/06 0504) Last BM Date: 01/14/12  Intake/Output from previous day: 08/05 0701 - 08/06 0700 In: 1869.6 [I.V.:1869.6] Out: 550 [Emesis/NG output:550] Intake/Output this shift:    General appearance: alert. Mental status normal. Very talkative and inquisitive. GI: abdomen is soft, nontender, not distended. Bowel sounds are present. No palpable mass. No hernia detected.  Lab Results:  Results for orders placed during the hospital encounter of 01/13/12 (from the past 24 hour(s))  TSH     Status: Normal   Collection Time   01/14/12 11:21 AM      Component Value Range   TSH 0.513  0.350 - 4.500 uIU/mL  CBC     Status: Abnormal   Collection Time   01/15/12  4:00 AM      Component Value Range   WBC 9.0  4.0 - 10.5 K/uL   RBC 3.78 (*) 3.87 - 5.11 MIL/uL   Hemoglobin 10.7 (*) 12.0 - 15.0 g/dL   HCT 16.1 (*) 09.6 - 04.5 %   MCV 88.1  78.0 - 100.0 fL   MCH 28.3  26.0 - 34.0 pg   MCHC 32.1  30.0 - 36.0 g/dL   RDW 40.9  81.1 - 91.4 %   Platelets 177  150 - 400 K/uL  BASIC METABOLIC PANEL     Status: Abnormal   Collection Time   01/15/12  4:00 AM      Component Value Range   Sodium 140  135 - 145 mEq/L   Potassium 4.0  3.5 - 5.1 mEq/L   Chloride 108  96 - 112 mEq/L   CO2 24  19 - 32 mEq/L   Glucose, Bld 80  70 -  99 mg/dL   BUN 16  6 - 23 mg/dL   Creatinine, Ser 7.82  0.50 - 1.10 mg/dL   Calcium 8.4  8.4 - 95.6 mg/dL   GFR calc non Af Amer 59 (*) >90 mL/min   GFR calc Af Amer 69 (*) >90 mL/min     Studies/Results: @RISRSLT24 @     . levothyroxine  76 mcg Intravenous Daily  . pneumococcal 23 valent vaccine  0.5 mL Intramuscular Tomorrow-1000  . sodium chloride  3 mL Intravenous Q12H     Assessment/Plan:  Partial SBO versus gastroenteritis. Obstructive symptoms have resolved clinically and radiographically. No evidence of compromised bowel Will discontinue nasogastric tube and allow diet. She may be discharged this evening if she tolerates her diet well.  If she does well and his discharge, no followup with surgery is required unless symptoms recur.     LOS: 2 days    Elic Vencill M. Derrell Lolling, M.D., [facility] General and Minimally invasive Surgery Breast and Colorectal Surgery Office:   [phone] Pager:   [phone]  01/15/2012  . .prob

## 2013-04-13 ENCOUNTER — Other Ambulatory Visit: Payer: Self-pay | Admitting: Internal Medicine

## 2013-04-13 LAB — CBC WITH DIFFERENTIAL/PLATELET
Basophils Absolute: 0 10*3/uL (ref 0.0–0.1)
Basophils Relative: 1 % (ref 0–1)
Eosinophils Absolute: 0.2 10*3/uL (ref 0.0–0.7)
Eosinophils Relative: 3 % (ref 0–5)
HCT: 31.6 % — ABNORMAL LOW (ref 36.0–46.0)
Hemoglobin: 10.6 g/dL — ABNORMAL LOW (ref 12.0–15.0)
Lymphocytes Relative: 24 % (ref 12–46)
Lymphs Abs: 1.7 10*3/uL (ref 0.7–4.0)
MCH: 29.1 pg (ref 26.0–34.0)
MCHC: 33.5 g/dL (ref 30.0–36.0)
MCV: 86.8 fL (ref 78.0–100.0)
Monocytes Absolute: 0.6 10*3/uL (ref 0.1–1.0)
Monocytes Relative: 8 % (ref 3–12)
Neutro Abs: 4.7 10*3/uL (ref 1.7–7.7)
Neutrophils Relative %: 64 % (ref 43–77)
Platelets: 243 10*3/uL (ref 150–400)
RBC: 3.64 MIL/uL — ABNORMAL LOW (ref 3.87–5.11)
RDW: 14 % (ref 11.5–15.5)
WBC: 7.3 10*3/uL (ref 4.0–10.5)

## 2013-04-13 LAB — RETICULOCYTES
ABS Retic: 32.8 10*3/uL (ref 19.0–186.0)
RBC.: 3.64 MIL/uL — ABNORMAL LOW (ref 3.87–5.11)
Retic Ct Pct: 0.9 % (ref 0.4–2.3)

## 2013-04-14 LAB — VITAMIN B12: Vitamin B-12: 332 pg/mL (ref 211–911)

## 2013-04-14 LAB — TSH: TSH: 0.679 u[IU]/mL (ref 0.350–4.500)

## 2013-04-14 LAB — FERRITIN: Ferritin: 53 ng/mL (ref 10–291)

## 2013-04-14 LAB — FOLATE RBC: RBC Folate: 478 ng/mL (ref >280)

## 2013-04-14 LAB — IRON AND TIBC
%SAT: 16 % — ABNORMAL LOW (ref 20–55)
Iron: 45 ug/dL (ref 42–145)
TIBC: 273 ug/dL (ref 250–470)
UIBC: 228 ug/dL (ref 125–400)

## 2013-04-15 ENCOUNTER — Telehealth: Payer: Self-pay

## 2013-04-15 NOTE — Telephone Encounter (Signed)
Called pt to schedule an appt. Pt states the appt has to be after Thanksgiving and that she will not have another colonoscopy. Pt scheduled to see Dr. Marina Goodell 05/15/13@3 :15pm. Pt aware of appt date and time.

## 2013-04-15 NOTE — Telephone Encounter (Signed)
Message copied by Linwood Dibbles on Wed Apr 15, 2013 11:25 AM ------      Message from: Quentin Mulling R      Created: Wed Apr 15, 2013  9:29 AM       The patient's H/H has continued to drop from 11.6-11.0-10.7 and now 10.6 with a decrease iron. Last colonoscopy was 2004 with Dr. Marina Goodell please tell patient to make an appointment with Dr. Marina Goodell. In addition, her B12 is low normal. Please start on sublingual B12 over the counter.             Thyroid dosage was recently changed and it is almost too much medication. Change to Levothyroxine one whole pill 3 days a week and 1/2 pill 4 days a week. ------

## 2013-04-15 NOTE — Telephone Encounter (Signed)
Patient made aware of lab results and instructions, she states that she will add a slow iron as she has not been taking one and will add a SL B12, states she understands the new dosage for Levothyroxine, but also states that she will not go see Dr Marina Goodell, she refuses to schedule with him and states that she will not get another colonoscopy, Quentin Mulling, PA made aware of patients response.

## 2013-04-15 NOTE — Telephone Encounter (Signed)
Message copied by Chrystie Nose on Wed Apr 15, 2013 10:01 AM ------      Message from: Hilarie Fredrickson      Created: Wed Apr 15, 2013  9:44 AM       Bonita Quin, this patient needs an office visit with me soon. Thanks            ----- Message -----         From: Quentin Mulling, PA-C         Sent: 04/15/2013   9:29 AM           To: Hilarie Fredrickson, MD, Linwood Dibbles, CMA            The patient's H/H has continued to drop from 11.6-11.0-10.7 and now 10.6 with a decrease iron. Last colonoscopy was 2004 with Dr. Marina Goodell please tell patient to make an appointment with Dr. Marina Goodell. In addition, her B12 is low normal. Please start on sublingual B12 over the counter.             Thyroid dosage was recently changed and it is almost too much medication. Change to Levothyroxine one whole pill 3 days a week and 1/2 pill 4 days a week.       ------

## 2013-04-29 ENCOUNTER — Ambulatory Visit: Payer: Medicare PPO | Admitting: Internal Medicine

## 2013-05-15 ENCOUNTER — Ambulatory Visit: Payer: Medicare PPO | Admitting: Internal Medicine

## 2013-07-05 DIAGNOSIS — R7303 Prediabetes: Secondary | ICD-10-CM | POA: Insufficient documentation

## 2013-07-05 DIAGNOSIS — K5790 Diverticulosis of intestine, part unspecified, without perforation or abscess without bleeding: Secondary | ICD-10-CM | POA: Insufficient documentation

## 2013-07-06 ENCOUNTER — Ambulatory Visit: Payer: Self-pay | Admitting: Physician Assistant

## 2013-08-24 ENCOUNTER — Encounter: Payer: Self-pay | Admitting: Internal Medicine

## 2013-08-24 ENCOUNTER — Ambulatory Visit (INDEPENDENT_AMBULATORY_CARE_PROVIDER_SITE_OTHER): Payer: Medicare PPO | Admitting: Internal Medicine

## 2013-08-24 VITALS — BP 120/64 | HR 68 | Temp 98.1°F | Resp 16 | Ht 64.0 in | Wt 164.2 lb

## 2013-08-24 DIAGNOSIS — R109 Unspecified abdominal pain: Secondary | ICD-10-CM

## 2013-08-24 DIAGNOSIS — R112 Nausea with vomiting, unspecified: Secondary | ICD-10-CM

## 2013-08-24 MED ORDER — HYOSCYAMINE SULFATE 0.125 MG PO TABS: ORAL_TABLET | ORAL | Status: DC

## 2013-08-24 MED ORDER — ONDANSETRON HCL 8 MG PO TABS: ORAL_TABLET | ORAL | Status: DC

## 2013-08-24 NOTE — Patient Instructions (Signed)
Abdominal Pain, Adult  Many things can cause belly (abdominal) pain. Most times, the belly pain is not dangerous. Many cases of belly pain can be watched and treated at home.  HOME CARE   · Do not take medicines that help you go poop (laxatives) unless told to by your doctor.  · Only take medicine as told by your doctor.  · Eat or drink as told by your doctor. Your doctor will tell you if you should be on a special diet.  GET HELP IF:  · You do not know what is causing your belly pain.  · You have belly pain while you are sick to your stomach (nauseous) or have runny poop (diarrhea).  · You have pain while you pee or poop.  · Your belly pain wakes you up at night.  · You have belly pain that gets worse or better when you eat.  · You have belly pain that gets worse when you eat fatty foods.  GET HELP RIGHT AWAY IF:   · The pain does not go away within 2 hours.  · You have a fever.  · You keep throwing up (vomiting).  · The pain changes and is only in the right or left part of the belly.  · You have bloody or tarry looking poop.  MAKE SURE YOU:   · Understand these instructions.  · Will watch your condition.  · Will get help right away if you are not doing well or get worse.  Document Released: 11/14/2007 Document Revised: 03/18/2013 Document Reviewed: 02/04/2013  [employer]® Patient Information ©2014 [employer].

## 2013-08-24 NOTE — Progress Notes (Signed)
   Subjective:    Patient ID: Mikayla Harrison, female    DOB: March 13, 1932, 80 y.o.   MRN: [id number]  Abdominal Pain This is a recurrent problem. The current episode started more than 1 year ago. The onset quality is undetermined. The problem occurs every several days. The problem has been waxing and waning. The pain is located in the RUQ and periumbilical region. The pain is mild. The quality of the pain is colicky, aching and cramping. The abdominal pain does not radiate. Associated symptoms include constipation, diarrhea, nausea and vomiting. Pertinent negatives include no anorexia, belching, dysuria, fever, flatus, frequency, headaches, hematochezia, hematuria, melena, myalgias or weight loss. The pain is relieved by eating. She has tried nothing for the symptoms.      Review of Systems  Constitutional: Negative.  Negative for fever and weight loss.  HENT: Negative.   Eyes: Negative.   Respiratory: Negative.   Cardiovascular: Negative.   Gastrointestinal: Positive for nausea, vomiting, abdominal pain, diarrhea, constipation and abdominal distention. Negative for blood in stool, melena, hematochezia, anal bleeding, rectal pain, anorexia and flatus.  Genitourinary: Negative.  Negative for dysuria, frequency and hematuria.  Musculoskeletal: Negative for myalgias, neck pain and neck stiffness.  Skin: Negative for pallor and rash.  Neurological: Negative for headaches.  Psychiatric/Behavioral: Negative.        Objective:   Physical Exam  Constitutional: She appears well-nourished. No distress.  HENT:  Right Ear: External ear normal.  Left Ear: External ear normal.  Nose: Nose normal.  Mouth/Throat: Oropharynx is clear and moist. No oropharyngeal exudate.  Eyes: Conjunctivae and EOM are normal. Pupils are equal, round, and reactive to light. Right eye exhibits discharge. Left eye exhibits no discharge. No scleral icterus.  Neck: Normal range of motion. Neck supple. No JVD present. No  thyromegaly present.  Cardiovascular: Normal rate, regular rhythm and normal heart sounds.   No murmur heard. Pulmonary/Chest: Effort normal and breath sounds normal. No respiratory distress. She has no wheezes. She has no rales.  Abdominal: She exhibits distension. She exhibits no mass. There is tenderness. There is no rebound and no guarding.  Abdomen is di=ughy and intermittently is tender in the RLQ without guarding.  Lymphadenopathy:    She has no cervical adenopathy.  Skin: She is not diaphoretic.   BP 120/64  Pulse 68  Temp(Src) 98.1 F (36.7 C) (Temporal)  Resp 16  Ht 5\' 4"  (1.626 m)  Wt 164 lb 3.2 oz (74.481 kg)  BMI 28.17 kg/m2  Assessment & Plan:  1. Abdominal pain, unspecified site  - hyoscyamine (LEVSIN, ANASPAZ) 0.125 MG tablet; Take 1 or 2 tablets every 4 to 6 hours as needed for abdominal cramping  Dispense: 30 tablet; Refill: 0 - CT Abdomen Pelvis W Contrast; Future - CBC with Differential - BASIC METABOLIC PANEL WITH GFR - Hepatic function panel - Amylase  2. Nausea with vomiting  - ondansetron (ZOFRAN) 8 MG tablet; Take 1 tablet 3 x day if needed for Nausea and Vomitting  Dispense: 20 tablet; Refill: 0

## 2013-08-25 ENCOUNTER — Ambulatory Visit
Admission: RE | Admit: 2013-08-25 | Discharge: 2013-08-25 | Disposition: A | Discharge status: Home / Self Care | Payer: Medicare PPO | Source: Ambulatory Visit | Attending: Internal Medicine | Admitting: Internal Medicine

## 2013-08-25 DIAGNOSIS — R109 Unspecified abdominal pain: Secondary | ICD-10-CM

## 2013-08-25 LAB — CBC WITH DIFFERENTIAL/PLATELET
Basophils Absolute: 0 10*3/uL (ref 0.0–0.1)
Basophils Relative: 0 % (ref 0–1)
Eosinophils Absolute: 0.2 10*3/uL (ref 0.0–0.7)
Eosinophils Relative: 2 % (ref 0–5)
HCT: 35 % — ABNORMAL LOW (ref 36.0–46.0)
Hemoglobin: 11.4 g/dL — ABNORMAL LOW (ref 12.0–15.0)
Lymphocytes Relative: 24 % (ref 12–46)
Lymphs Abs: 1.8 10*3/uL (ref 0.7–4.0)
MCH: 27.8 pg (ref 26.0–34.0)
MCHC: 32.6 g/dL (ref 30.0–36.0)
MCV: 85.4 fL (ref 78.0–100.0)
Monocytes Absolute: 0.5 10*3/uL (ref 0.1–1.0)
Monocytes Relative: 7 % (ref 3–12)
Neutro Abs: 5.2 10*3/uL (ref 1.7–7.7)
Neutrophils Relative %: 67 % (ref 43–77)
Platelets: 237 10*3/uL (ref 150–400)
RBC: 4.1 MIL/uL (ref 3.87–5.11)
RDW: 14.8 % (ref 11.5–15.5)
WBC: 7.7 10*3/uL (ref 4.0–10.5)

## 2013-08-25 LAB — BASIC METABOLIC PANEL WITHOUT GFR
BUN: 22 mg/dL (ref 6–23)
Calcium: 8.8 mg/dL (ref 8.4–10.5)
Creat: 0.96 mg/dL (ref 0.50–1.10)
GFR, Est African American: 65 mL/min
GFR, Est Non African American: 56 mL/min — ABNORMAL LOW

## 2013-08-25 LAB — HEPATIC FUNCTION PANEL
ALT: 13 U/L (ref 0–35)
AST: 25 U/L (ref 0–37)
Albumin: 3.9 g/dL (ref 3.5–5.2)
Alkaline Phosphatase: 64 U/L (ref 39–117)
Bilirubin, Direct: 0.1 mg/dL (ref 0.0–0.3)
Indirect Bilirubin: 0.3 mg/dL (ref 0.2–1.2)
Total Bilirubin: 0.4 mg/dL (ref 0.2–1.2)
Total Protein: 6.8 g/dL (ref 6.0–8.3)

## 2013-08-25 LAB — BASIC METABOLIC PANEL WITH GFR
CO2: 28 mEq/L (ref 19–32)
Chloride: 104 mEq/L (ref 96–112)
Glucose, Bld: 100 mg/dL — ABNORMAL HIGH (ref 70–99)
Potassium: 4.1 mEq/L (ref 3.5–5.3)
Sodium: 139 mEq/L (ref 135–145)

## 2013-08-25 LAB — AMYLASE: Amylase: 16 U/L (ref 0–105)

## 2013-08-25 MED ORDER — IOHEXOL 300 MG/ML  SOLN
30.0000 mL | Freq: Once | INTRAMUSCULAR | Status: AC | PRN
  Administered 2013-08-25: 30 mL via ORAL

## 2013-08-25 MED ORDER — IOHEXOL 300 MG/ML  SOLN
100.0000 mL | Freq: Once | INTRAMUSCULAR | Status: AC | PRN
  Administered 2013-08-25: 100 mL via INTRAVENOUS

## 2013-09-07 ENCOUNTER — Other Ambulatory Visit: Payer: Self-pay | Admitting: *Deleted

## 2013-09-07 MED ORDER — BENAZEPRIL HCL 40 MG PO TABS: 40.0000 mg | ORAL_TABLET | Freq: Every day | ORAL | Status: DC

## 2013-09-22 ENCOUNTER — Ambulatory Visit (INDEPENDENT_AMBULATORY_CARE_PROVIDER_SITE_OTHER): Payer: Medicare PPO | Admitting: Emergency Medicine

## 2013-09-22 ENCOUNTER — Encounter: Payer: Self-pay | Admitting: Emergency Medicine

## 2013-09-22 VITALS — BP 124/62 | HR 66 | Temp 98.2°F | Resp 16 | Ht 64.0 in | Wt 164.0 lb

## 2013-09-22 DIAGNOSIS — J040 Acute laryngitis: Secondary | ICD-10-CM

## 2013-09-22 DIAGNOSIS — J40 Bronchitis, not specified as acute or chronic: Secondary | ICD-10-CM

## 2013-09-22 DIAGNOSIS — J309 Allergic rhinitis, unspecified: Secondary | ICD-10-CM

## 2013-09-22 MED ORDER — BENZONATATE 100 MG PO CAPS: 100.0000 mg | ORAL_CAPSULE | Freq: Three times a day (TID) | ORAL | Status: DC | PRN

## 2013-09-22 MED ORDER — METHYLPREDNISOLONE (PAK) 4 MG PO TABS: 4.0000 mg | ORAL_TABLET | Freq: Every day | ORAL | Status: DC

## 2013-09-22 MED ORDER — DEXAMETHASONE SODIUM PHOSPHATE 100 MG/10ML IJ SOLN: 10.0000 mg | Freq: Once | INTRAMUSCULAR | Status: DC

## 2013-09-22 MED ORDER — DEXAMETHASONE SODIUM PHOSPHATE 10 MG/ML IJ SOLN
10.0000 mg | Freq: Once | INTRAMUSCULAR | Status: AC
Start: 2013-09-22 — End: 2013-09-22
  Administered 2013-09-22: 10 mg via INTRAMUSCULAR

## 2013-09-22 NOTE — Patient Instructions (Addendum)
Bronchitis Bronchitis is swelling (inflammation) of the air tubes leading to your lungs (bronchi). This causes mucus and a cough. If the swelling gets bad, you may have trouble breathing. HOME CARE   Rest.  Drink enough fluids to keep your pee (urine) clear or pale yellow (unless you have a condition where you have to watch how much you drink).  Only take medicine as told by your doctor. If you were given antibiotic medicines, finish them even if you start to feel better.  Avoid smoke, irritating chemicals, and strong smells. These make the problem worse. Quit smoking if you smoke. This helps your lungs heal faster.  Use a cool mist humidifier. Change the water in the humidifier every day. You can also sit in the bathroom with hot shower running for 5 10 minutes. Keep the door closed.  See your health care provider as told.  Wash your hands often. GET HELP IF: Your problems do not get better after 1 week. GET HELP RIGHT AWAY IF:   Your fever gets worse.  You have chills.  Your chest hurts.  Your problems breathing get worse.  You have blood in your mucus.  You pass out (faint).  You feel lightheaded.  You have a bad headache.  You throw up (vomit) again and again. MAKE SURE YOU:  Understand these instructions.  Will watch your condition.  Will get help right away if you are not doing well or get worse. Document Released: 11/14/2007 Document Revised: 03/18/2013 Document Reviewed: 01/20/2013 [facility] Patient Information 2014 [place], [place]. Laryngitis  Warm salt water gargles daily. 1 tsp liquid benadryl + 1 tsp liquid Maalox, MIX/ GARGLE/ SPIT as needed for pain  Laryngitis is redness, soreness, and puffiness (inflammation) of the vocal cords. It causes hoarseness, cough, loss of voice, sore throat, and dry throat. It may be caused by:  Infection.  Too much smoking.  Too much talking or yelling.  Breathing in of toxic fumes.  Allergies.  A backup of acid  from your stomach. HOME CARE  Drink enough fluids to keep your pee (urine) clear or pale yellow.  Rest until you no longer have problems or as told by your doctor.  Breathe in moist air.  Take all medicine as told by your doctor.  Do not smoke.  Talk as little as possible (this includes whispering).  Write on paper instead of talking until your voice is back to normal.  Follow up with your doctor if you have not improved after 10 days. GET HELP IF:   You have trouble breathing.  You cough up blood.  You have a fever that will not go away.  You have increasing pain.  You have trouble swallowing. MAKE SURE YOU:  Understand these instructions.  Will watch your condition.  Will get help right away if you are not doing well or get worse. Document Released: 05/17/2011 Document Revised: 08/20/2011 Document Reviewed: 05/17/2011 [facility] Patient Information 2014 [place], [place].

## 2013-09-22 NOTE — Progress Notes (Signed)
   Subjective:    Patient ID: Mikayla Harrison, female    DOB: Nov 08, 1931, 80 y.o.   MRN: [id number]  HPI Comments: 80 yo female started having increased allergy drainage over last week and now has increased cough x 4 days and lost voice yesterday. She has occasional production with cough. She denies color to nasal production. She has tried OTC cold without relief.  Cough Associated symptoms include postnasal drip.     Medication List       This list is accurate as of: 09/22/13  2:47 PM.  Always use your most recent med list.               aspirin 81 MG tablet  Take 81 mg by mouth daily.     benazepril 40 MG tablet  Commonly known as:  LOTENSIN  Take 1 tablet (40 mg total) by mouth daily.     levothyroxine 150 MCG tablet  Commonly known as:  SYNTHROID, LEVOTHROID  Take 150 mcg by mouth daily.       Allergies  Allergen Reactions  . Asa [Aspirin]   . Codeine Nausea Only  . Prednisone Nausea And Vomiting  . Sulfa Antibiotics    Past Medical History  Diagnosis Date  . Hypertension   . Hyperlipidemia   . Hypothyroidism   . Anemia   . Iron deficiency   . Small bowel obstruction   . Colon polyp   . Diverticulosis   . Prediabetes       Review of Systems  HENT: Positive for postnasal drip and voice change.   Respiratory: Positive for cough.   All other systems reviewed and are negative.  BP 124/62  Pulse 66  Temp(Src) 98.2 F (36.8 C) (Temporal)  Resp 16  Ht 5\' 4"  (1.626 m)  Wt 164 lb (74.39 kg)  BMI 28.14 kg/m2  SpO2 97%     Objective:   Physical Exam  Nursing note and vitals reviewed. Constitutional: She is oriented to person, place, and time. She appears well-developed and well-nourished.  HENT:  Head: Normocephalic and atraumatic.  Right Ear: External ear normal.  Left Ear: External ear normal.  Nose: Nose normal.  Mouth/Throat: Oropharynx is clear and moist. No oropharyngeal exudate.  Hoarse voice, Cobblestones posterior pharynx, Cloudy TM's  bilaterally   Eyes: Conjunctivae and EOM are normal.  Neck: Normal range of motion.  Cardiovascular: Normal rate, regular rhythm, normal heart sounds and intact distal pulses.   Pulmonary/Chest: Effort normal and breath sounds normal.  Musculoskeletal: Normal range of motion.  Lymphadenopathy:    She has no cervical adenopathy.  Neurological: She is alert and oriented to person, place, and time.  Skin: Skin is warm and dry.  Psychiatric: She has a normal mood and affect. Judgment normal.          Assessment & Plan:  1. Bronchitis/ laryngitis- Dexamethasone 10 mg IM. Tessalon perles 100 mg AD, If symptoms continue start Medrol DP tomorrow AD. Warm salt water gargles daily. 1 tsp liquid benadryl + 1 tsp liquid Maalox, MIX/ GARGLE/ SPIT as needed for pain. Patient will call if symptoms increase for antibiotics.  2. Allergic rhinitis- Allegra OTC, increase H2o, allergy hygiene explained.

## 2013-10-05 ENCOUNTER — Encounter: Payer: Self-pay | Admitting: Internal Medicine

## 2013-10-05 DIAGNOSIS — E782 Mixed hyperlipidemia: Secondary | ICD-10-CM | POA: Insufficient documentation

## 2013-10-05 DIAGNOSIS — Z1211 Encounter for screening for malignant neoplasm of colon: Secondary | ICD-10-CM | POA: Insufficient documentation

## 2013-10-05 DIAGNOSIS — Z79899 Other long term (current) drug therapy: Secondary | ICD-10-CM

## 2013-10-05 DIAGNOSIS — E559 Vitamin D deficiency, unspecified: Secondary | ICD-10-CM | POA: Insufficient documentation

## 2013-10-05 NOTE — Progress Notes (Signed)
Patient ID: Mikayla Harrison, female   DOB: 1931-08-23, 80 y.o.   MRN: [id number]             E R R O R

## 2013-10-14 ENCOUNTER — Ambulatory Visit (INDEPENDENT_AMBULATORY_CARE_PROVIDER_SITE_OTHER): Payer: Medicare PPO | Admitting: Physician Assistant

## 2013-10-14 ENCOUNTER — Encounter: Payer: Self-pay | Admitting: Physician Assistant

## 2013-10-14 VITALS — BP 110/60 | HR 68 | Temp 98.1°F | Resp 16 | Wt 168.0 lb

## 2013-10-14 DIAGNOSIS — J0100 Acute maxillary sinusitis, unspecified: Secondary | ICD-10-CM

## 2013-10-14 MED ORDER — AZITHROMYCIN 250 MG PO TABS: ORAL_TABLET | ORAL | Status: DC

## 2013-10-14 NOTE — Progress Notes (Signed)
   Subjective:    Patient ID: Mikayla Harrison, female    DOB: [birth year], 80 y.o.   MRN: [id number]  Sinus Problem This is a new problem. Episode onset: 4 days. There has been no fever. Associated symptoms include chills, congestion, coughing, ear pain (right), headaches, a hoarse voice, neck pain, sinus pressure, sneezing, a sore throat and swollen glands. Pertinent negatives include no diaphoresis or shortness of breath. Past treatments include nothing.      Review of Systems  Constitutional: Positive for chills. Negative for diaphoresis.  HENT: Positive for congestion, ear pain (right), hoarse voice, sinus pressure, sneezing and sore throat.   Respiratory: Positive for cough. Negative for shortness of breath.   Musculoskeletal: Positive for neck pain.  Neurological: Positive for headaches.       Objective:   Physical Exam  Constitutional: She appears well-developed and well-nourished.  HENT:  Head: Normocephalic and atraumatic.  Right Ear: External ear normal.  Nose: Right sinus exhibits maxillary sinus tenderness. Right sinus exhibits no frontal sinus tenderness. Left sinus exhibits maxillary sinus tenderness. Left sinus exhibits no frontal sinus tenderness.  Eyes: Conjunctivae and EOM are normal.  Neck: Normal range of motion. Neck supple.  Cardiovascular: Normal rate, regular rhythm, normal heart sounds and intact distal pulses.   Pulmonary/Chest: Effort normal and breath sounds normal. No respiratory distress. She has no wheezes.  Abdominal: Soft. Bowel sounds are normal.  Lymphadenopathy:    She has cervical adenopathy.  Skin: Skin is warm and dry.       Assessment & Plan:  Acute maxillary sinusitis - Plan: azithromycin (ZITHROMAX) 250 MG tablet

## 2013-10-14 NOTE — Patient Instructions (Signed)

## 2013-12-22 ENCOUNTER — Other Ambulatory Visit: Payer: Self-pay | Admitting: Internal Medicine

## 2013-12-22 DIAGNOSIS — Z1231 Encounter for screening mammogram for malignant neoplasm of breast: Secondary | ICD-10-CM

## 2013-12-30 ENCOUNTER — Ambulatory Visit (HOSPITAL_COMMUNITY): Payer: Medicare PPO

## 2014-01-25 ENCOUNTER — Ambulatory Visit (HOSPITAL_COMMUNITY): Payer: Medicare PPO

## 2014-01-30 ENCOUNTER — Encounter: Payer: Self-pay | Admitting: Internal Medicine

## 2014-03-16 ENCOUNTER — Ambulatory Visit (INDEPENDENT_AMBULATORY_CARE_PROVIDER_SITE_OTHER): Payer: Medicare PPO | Admitting: Internal Medicine

## 2014-03-16 ENCOUNTER — Encounter: Payer: Self-pay | Admitting: Internal Medicine

## 2014-03-16 VITALS — BP 130/74 | HR 68 | Temp 98.1°F | Resp 16 | Ht 63.5 in | Wt 171.0 lb

## 2014-03-16 DIAGNOSIS — E559 Vitamin D deficiency, unspecified: Secondary | ICD-10-CM

## 2014-03-16 DIAGNOSIS — E782 Mixed hyperlipidemia: Secondary | ICD-10-CM

## 2014-03-16 DIAGNOSIS — I10 Essential (primary) hypertension: Secondary | ICD-10-CM

## 2014-03-16 DIAGNOSIS — Z1331 Encounter for screening for depression: Secondary | ICD-10-CM

## 2014-03-16 DIAGNOSIS — R6889 Other general symptoms and signs: Secondary | ICD-10-CM

## 2014-03-16 DIAGNOSIS — Z9181 History of falling: Secondary | ICD-10-CM

## 2014-03-16 DIAGNOSIS — E039 Hypothyroidism, unspecified: Secondary | ICD-10-CM

## 2014-03-16 DIAGNOSIS — N183 Chronic kidney disease, stage 3 unspecified: Secondary | ICD-10-CM

## 2014-03-16 DIAGNOSIS — Z0001 Encounter for general adult medical examination with abnormal findings: Secondary | ICD-10-CM

## 2014-03-16 DIAGNOSIS — Z79899 Other long term (current) drug therapy: Secondary | ICD-10-CM

## 2014-03-16 DIAGNOSIS — Z1212 Encounter for screening for malignant neoplasm of rectum: Secondary | ICD-10-CM

## 2014-03-16 DIAGNOSIS — R7303 Prediabetes: Secondary | ICD-10-CM

## 2014-03-16 DIAGNOSIS — Z23 Encounter for immunization: Secondary | ICD-10-CM

## 2014-03-16 DIAGNOSIS — R7309 Other abnormal glucose: Secondary | ICD-10-CM

## 2014-03-16 LAB — URINALYSIS, MICROSCOPIC ONLY
Bacteria, UA: NONE SEEN
Casts: NONE SEEN
Crystals: NONE SEEN
Squamous Epithelial / HPF: NONE SEEN

## 2014-03-16 LAB — CBC WITH DIFFERENTIAL/PLATELET
Basophils Absolute: 0 10*3/uL (ref 0.0–0.1)
Basophils Relative: 0 % (ref 0–1)
Eosinophils Absolute: 0.2 10*3/uL (ref 0.0–0.7)
Eosinophils Relative: 2 % (ref 0–5)
HCT: 33.1 % — ABNORMAL LOW (ref 36.0–46.0)
Hemoglobin: 10.9 g/dL — ABNORMAL LOW (ref 12.0–15.0)
Lymphocytes Relative: 12 % (ref 12–46)
Lymphs Abs: 1.2 10*3/uL (ref 0.7–4.0)
MCH: 27.6 pg (ref 26.0–34.0)
MCHC: 32.9 g/dL (ref 30.0–36.0)
MCV: 83.8 fL (ref 78.0–100.0)
Monocytes Absolute: 0.7 10*3/uL (ref 0.1–1.0)
Monocytes Relative: 7 % (ref 3–12)
Neutro Abs: 8 10*3/uL — ABNORMAL HIGH (ref 1.7–7.7)
Neutrophils Relative %: 79 % — ABNORMAL HIGH (ref 43–77)
Platelets: 231 10*3/uL (ref 150–400)
RBC: 3.95 MIL/uL (ref 3.87–5.11)
RDW: 14.2 % (ref 11.5–15.5)
WBC: 10.1 10*3/uL (ref 4.0–10.5)

## 2014-03-16 LAB — LIPID PANEL
Cholesterol: 128 mg/dL (ref 0–200)
HDL: 42 mg/dL (ref >39)
LDL Cholesterol: 68 mg/dL (ref 0–99)
Total CHOL/HDL Ratio: 3 Ratio
Triglycerides: 88 mg/dL (ref <150)
VLDL: 18 mg/dL (ref 0–40)

## 2014-03-16 LAB — HEMOGLOBIN A1C
Hgb A1c MFr Bld: 6.1 % — ABNORMAL HIGH (ref <5.7)
Mean Plasma Glucose: 128 mg/dL — ABNORMAL HIGH (ref <117)

## 2014-03-16 LAB — BASIC METABOLIC PANEL WITH GFR
BUN: 19 mg/dL (ref 6–23)
CO2: 26 mEq/L (ref 19–32)
Calcium: 8.5 mg/dL (ref 8.4–10.5)
Chloride: 106 mEq/L (ref 96–112)
Creat: 1.03 mg/dL (ref 0.50–1.10)
Glucose, Bld: 81 mg/dL (ref 70–99)
Sodium: 140 mEq/L (ref 135–145)

## 2014-03-16 LAB — MAGNESIUM: Magnesium: 1.8 mg/dL (ref 1.5–2.5)

## 2014-03-16 LAB — HEPATIC FUNCTION PANEL
ALT: 13 U/L (ref 0–35)
AST: 21 U/L (ref 0–37)
Albumin: 3.9 g/dL (ref 3.5–5.2)
Alkaline Phosphatase: 76 U/L (ref 39–117)
Bilirubin, Direct: 0.1 mg/dL (ref 0.0–0.3)
Indirect Bilirubin: 0.3 mg/dL (ref 0.2–1.2)
Total Bilirubin: 0.4 mg/dL (ref 0.2–1.2)
Total Protein: 6.7 g/dL (ref 6.0–8.3)

## 2014-03-16 LAB — TSH: TSH: 0.6 u[IU]/mL (ref 0.350–4.500)

## 2014-03-16 LAB — BASIC METABOLIC PANEL WITHOUT GFR
GFR, Est African American: 59 mL/min — ABNORMAL LOW
GFR, Est Non African American: 51 mL/min — ABNORMAL LOW
Potassium: 3.9 meq/L (ref 3.5–5.3)

## 2014-03-16 LAB — MICROALBUMIN / CREATININE URINE RATIO
Creatinine, Urine: 72 mg/dL
Microalb Creat Ratio: 8.3 mg/g (ref 0.0–30.0)
Microalb, Ur: 0.6 mg/dL (ref <2.0)

## 2014-03-16 NOTE — Progress Notes (Signed)
Patient ID: Mikayla Harrison, female   DOB: July 01, 1931, 80 y.o.   MRN: [id number]  Annual Screening & Preventative  Comprehensive Examination  This very nice 80 y.o.WWF presents for complete physical.  Patient has been followed for HTN, Hypothyroidism,   Prediabetes, Hyperlipidemia, and Vitamin D Deficiency.    HTN predates since 26. Patient's BP has been controlled at home and patient denies any cardiac symptoms as chest pain, palpitations, shortness of breath, dizziness or ankle swelling. Today's BP: 130/74 mmHg. Patient does have Stage 3 CKD (GFR 55 ml/min) felt due to Hypertension.   Patient's hyperlipidemia is controlled with diet and exercise.  Last lipids were Total Chol 128; HDL  42; LDL  68; Trig 88 - at goal on 03/16/2014.   Patient has prediabetes predating since 2007 and patient denies reactive hypoglycemic symptoms, visual blurring, diabetic polys, or paresthesias. Last A1c was  6.1% on 03/16/2014.   Finally, patient has history of Vitamin D Deficiency (36 in 2009) and last Vitamin D remained low at  37 in Sept 2014  w/o supplements.  Medication Sig  . aspirin 81 MG tablet Take  daily.    . benazepril  40 MG tablet Take 1 tablet  daily.  Marland Kitchen levothyroxine 150 MCG tablet Take  daily.   Allergies  Allergen Reactions  . Asa [Aspirin]   . Codeine Nausea Only  . Prednisone Nausea And Vomiting  . Sulfa Antibiotics    Past Medical History  Diagnosis Date  . Hypertension   . Hyperlipidemia   . Hypothyroidism   . Anemia   . Iron deficiency   . Small bowel obstruction   . Colon polyp   . Diverticulosis   . Prediabetes    Past Surgical History  Procedure Laterality Date  . Cataract extraction    . Vericose vein  1960  . Macular tear     Family History  Problem Relation Age of Onset  . Heart disease Mother   . Heart disease Father   . Colon polyps Sister   . Diabetes Sister   . Heart disease Brother    History  Substance Use Topics  . Smoking status: Former Smoker --  1.00 packs/day for 15 years    Quit date: 06/11/1965  . Smokeless tobacco: Never Used  . Alcohol Use: No   Patient still working 3 days/wk as a Oceanographer.   ROS Constitutional: Denies fever, chills, weight loss/gain, headaches, insomnia, fatigue, night sweats, and change in appetite. Eyes: Denies redness, blurred vision, diplopia, discharge, itchy, watery eyes.  ENT: Denies discharge, congestion, post nasal drip, epistaxis, sore throat, earache, hearing loss, dental pain, Tinnitus, Vertigo, Sinus pain, snoring.  Cardio: Denies chest pain, palpitations, irregular heartbeat, syncope, dyspnea, diaphoresis, orthopnea, PND, claudication, edema Respiratory: denies cough, dyspnea, DOE, pleurisy, hoarseness, laryngitis, wheezing.  Gastrointestinal: Denies dysphagia, heartburn, reflux, water brash, pain, cramps, nausea, vomiting, bloating, diarrhea, constipation, hematemesis, melena, hematochezia, jaundice, hemorrhoids Genitourinary: Denies dysuria, frequency, urgency, nocturia, hesitancy, discharge, hematuria, flank pain Breast: Breast lumps, nipple discharge, bleeding.  Musculoskeletal: Denies arthralgia, myalgia, stiffness, Jt. Swelling, pain, limp, and strain/sprain. Denies falls. Skin: Denies puritis, rash, hives, warts, acne, eczema, changing in skin lesion Neuro: No weakness, tremor, incoordination, spasms, paresthesia, pain Psychiatric: Denies confusion, memory loss, sensory loss. Denies Depression. Endocrine: Denies change in weight, skin, hair change, nocturia, and paresthesia, diabetic polys, visual blurring, hyper / hypo glycemic episodes.  Heme/Lymph: No excessive bleeding, bruising, enlarged lymph nodes.  Physical Exam  BP 130/74  Pulse 68  Temp  98.1 F   Resp 16  Ht 5' 3.5"   Wt 171 lb   BMI 29.81   General Appearance: Well nourished and in no apparent distress. Eyes: PERRLA, EOMs, conjunctiva no swelling or erythema, normal fundi and vessels. Sinuses: No  frontal/maxillary tenderness ENT/Mouth: EACs patent / TMs  nl. Nares clear without erythema, swelling, mucoid exudates. Oral hygiene is good. No erythema, swelling, or exudate. Tongue normal, non-obstructing. Tonsils not swollen or erythematous. Hearing normal.  Neck: Supple, thyroid normal. No bruits, nodes or JVD. Respiratory: Respiratory effort normal.  BS equal and clear bilateral without rales, rhonci, wheezing or stridor. Cardio: Heart sounds are normal with regular rate and rhythm and no murmurs, rubs or gallops. Peripheral pulses are normal and equal bilaterally without edema. No aortic or femoral bruits. Chest: symmetric with normal excursions and percussion. Breasts: Symmetric, without lumps, nipple discharge, retractions, or fibrocystic changes.  Abdomen: Flat, soft, with bowl sounds. Nontender, no guarding, rebound, hernias, masses, or organomegaly.  Lymphatics: Non tender without lymphadenopathy.  Genitourinary:  Musculoskeletal: Full ROM all peripheral extremities, joint stability, 5/5 strength, and normal gait. Skin: Warm and dry without rashes, lesions, cyanosis, clubbing or  ecchymosis.  Neuro: Cranial nerves intact, reflexes equal bilaterally. Normal muscle tone, no cerebellar symptoms. Sensation intact.  Pysch: Awake and oriented X 3, normal affect, Insight and Judgment appropriate.  Assessment and Plan  1. Annual Screening Examination 2. Hypertension  3. Hyperlipidemia 4. Pre Diabetes 5. Vitamin D Deficiency 6. Hypothyroidism   Continue prudent diet as discussed, weight control, BP monitoring, regular exercise, and medications. Discussed med's effects and SE's. Screening labs and tests as requested with regular follow-up as recommended.

## 2014-03-16 NOTE — Patient Instructions (Signed)
Recommend the book "The END of DIETING" by Dr Baker Janus   and the book "The END of DIABETES " by Dr Excell Seltzer  At [email or web address] - get book & Audio CD's      Being diabetic has a  300% increased risk for heart attack, stroke, cancer, and alzheimer- type vascular dementia. It is very important that you work harder with diet by avoiding all foods that are white except chicken & fish. Avoid white rice (brown & wild rice is OK), white potatoes (sweetpotatoes in moderation is OK), White bread or wheat bread or anything made out of white flour like bagels, donuts, rolls, buns, biscuits, cakes, pastries, cookies, pizza crust, and pasta (made from white flour & egg whites) - vegetarian pasta or spinach or wheat pasta is OK. Multigrain breads like Arnold's or [employer], or multigrain sandwich thins or flatbreads.  Diet, exercise and weight loss can reverse and cure diabetes in the early stages.  Diet, exercise and weight loss is very important in the control and prevention of complications of diabetes which affects every system in your body, ie. Brain - dementia/stroke, eyes - glaucoma/blindness, heart - heart attack/heart failure, kidneys - dialysis, stomach - gastric paralysis, intestines - malabsorption, nerves - severe painful neuritis, circulation - gangrene & loss of a leg(s), and finally cancer and Alzheimers.    I recommend avoid fried & greasy foods,  sweets/candy, white rice (brown or wild rice or Quinoa is OK), white potatoes (sweet potatoes are OK) - anything made from white flour - bagels, doughnuts, rolls, buns, biscuits,white and wheat breads, pizza crust and traditional pasta made of white flour & egg white(vegetarian pasta or spinach or wheat pasta is OK).  Multi-grain bread is OK - like multi-grain flat bread or sandwich thins. Avoid alcohol in excess. Exercise is also important.    Eat all the vegetables you want - avoid meat, especially red meat and dairy - especially cheese.  Cheese  is the most concentrated form of trans-fats which is the worst thing to clog up our arteries. Veggie cheese is OK which can be found in the fresh produce section at [employer] or [employer] or [employer]  Preventive Care for Adults A healthy lifestyle and preventive care can promote health and wellness. Preventive health guidelines for women include the following key practices.  A routine yearly physical is a good way to check with your health care provider about your health and preventive screening. It is a chance to share any concerns and updates on your health and to receive a thorough exam.  Visit your dentist for a routine exam and preventive care every 6 months. Brush your teeth twice a day and floss once a day. Good oral hygiene prevents tooth decay and gum disease.  The frequency of eye exams is based on your age, health, family medical history, use of contact lenses, and other factors. Follow your health care provider's recommendations for frequency of eye exams.  Eat a healthy diet. Foods like vegetables, fruits, whole grains, low-fat dairy products, and lean protein foods contain the nutrients you need without too many calories. Decrease your intake of foods high in solid fats, added sugars, and salt. Eat the right amount of calories for you.Get information about a proper diet from your health care provider, if necessary.  Regular physical exercise is one of the most important things you can do for your health. Most adults should get at least 150 minutes of moderate-intensity exercise (any activity that increases  your heart rate and causes you to sweat) each week. In addition, most adults need muscle-strengthening exercises on 2 or more days a week.  Maintain a healthy weight. The body mass index (BMI) is a screening tool to identify possible weight problems. It provides an estimate of body fat based on height and weight. Your health care provider can find your BMI and can help you  achieve or maintain a healthy weight.For adults 20 years and older:  A BMI below 18.5 is considered underweight.  A BMI of 18.5 to 24.9 is normal.  A BMI of 25 to 29.9 is considered overweight.  A BMI of 30 and above is considered obese.  Maintain normal blood lipids and cholesterol levels by exercising and minimizing your intake of saturated fat. Eat a balanced diet with plenty of fruit and vegetables. Blood tests for lipids and cholesterol should begin at age 43 and be repeated every 5 years. If your lipid or cholesterol levels are high, you are over 50, or you are at high risk for heart disease, you may need your cholesterol levels checked more frequently.Ongoing high lipid and cholesterol levels should be treated with medicines if diet and exercise are not working.  If you smoke, find out from your health care provider how to quit. If you do not use tobacco, do not start.  Lung cancer screening is recommended for adults aged 40-80 years who are at high risk for developing lung cancer because of a history of smoking. A yearly low-dose CT scan of the lungs is recommended for people who have at least a 30-pack-year history of smoking and are a current smoker or have quit within the past 15 years. A pack year of smoking is smoking an average of 1 pack of cigarettes a day for 1 year (for example: 1 pack a day for 30 years or 2 packs a day for 15 years). Yearly screening should continue until the smoker has stopped smoking for at least 15 years. Yearly screening should be stopped for people who develop a health problem that would prevent them from having lung cancer treatment.  If you are pregnant, do not drink alcohol. If you are breastfeeding, be very cautious about drinking alcohol. If you are not pregnant and choose to drink alcohol, do not have more than 1 drink per day. One drink is considered to be 12 ounces (355 mL) of beer, 5 ounces (148 mL) of wine, or 1.5 ounces (44 mL) of liquor.  Avoid  use of street drugs. Do not share needles with anyone. Ask for help if you need support or instructions about stopping the use of drugs.  High blood pressure causes heart disease and increases the risk of stroke. Your blood pressure should be checked at least every 1 to 2 years. Ongoing high blood pressure should be treated with medicines if weight loss and exercise do not work.  If you are 74-43 years old, ask your health care provider if you should take aspirin to prevent strokes.  Diabetes screening involves taking a blood sample to check your fasting blood sugar level. This should be done once every 3 years, after age 105, if you are within normal weight and without risk factors for diabetes. Testing should be considered at a younger age or be carried out more frequently if you are overweight and have at least 1 risk factor for diabetes.  Breast cancer screening is essential preventive care for women. You should practice "breast self-awareness." This means understanding the  normal appearance and feel of your breasts and may include breast self-examination. Any changes detected, no matter how small, should be reported to a health care provider. Women in their 8s and 30s should have a clinical breast exam (CBE) by a health care provider as part of a regular health exam every 1 to 3 years. After age 64, women should have a CBE every year. Starting at age 57, women should consider having a mammogram (breast X-ray test) every year. Women who have a family history of breast cancer should talk to their health care provider about genetic screening. Women at a high risk of breast cancer should talk to their health care providers about having an MRI and a mammogram every year.  Breast cancer gene (BRCA)-related cancer risk assessment is recommended for women who have family members with BRCA-related cancers. BRCA-related cancers include breast, ovarian, tubal, and peritoneal cancers. Having family members with  these cancers may be associated with an increased risk for harmful changes (mutations) in the breast cancer genes BRCA1 and BRCA2. Results of the assessment will determine the need for genetic counseling and BRCA1 and BRCA2 testing.  Routine pelvic exams to screen for cancer are no longer recommended for nonpregnant women who are considered low risk for cancer of the pelvic organs (ovaries, uterus, and vagina) and who do not have symptoms. Ask your health care provider if a screening pelvic exam is right for you.  If you have had past treatment for cervical cancer or a condition that could lead to cancer, you need Pap tests and screening for cancer for at least 20 years after your treatment. If Pap tests have been discontinued, your risk factors (such as having a new sexual partner) need to be reassessed to determine if screening should be resumed. Some women have medical problems that increase the chance of getting cervical cancer. In these cases, your health care provider may recommend more frequent screening and Pap tests.  The HPV test is an additional test that may be used for cervical cancer screening. The HPV test looks for the virus that can cause the cell changes on the cervix. The cells collected during the Pap test can be tested for HPV. The HPV test could be used to screen women aged 53 years and older, and should be used in women of any age who have unclear Pap test results. After the age of 70, women should have HPV testing at the same frequency as a Pap test.  Colorectal cancer can be detected and often prevented. Most routine colorectal cancer screening begins at the age of 51 years and continues through age 39 years. However, your health care provider may recommend screening at an earlier age if you have risk factors for colon cancer. On a yearly basis, your health care provider may provide home test kits to check for hidden blood in the stool. Use of a small camera at the end of a tube, to  directly examine the colon (sigmoidoscopy or colonoscopy), can detect the earliest forms of colorectal cancer. Talk to your health care provider about this at age 44, when routine screening begins. Direct exam of the colon should be repeated every 5-10 years through age 61 years, unless early forms of pre-cancerous polyps or small growths are found.  Osteoporosis is a disease in which the bones lose minerals and strength with aging. This can result in serious bone fractures or breaks. The risk of osteoporosis can be identified using a bone density scan. Women ages  71 years and over and women at risk for fractures or osteoporosis should discuss screening with their health care providers. Ask your health care provider whether you should take a calcium supplement or vitamin D to reduce the rate of osteoporosis.  Menopause can be associated with physical symptoms and risks. Hormone replacement therapy is available to decrease symptoms and risks. You should talk to your health care provider about whether hormone replacement therapy is right for you.  Use sunscreen. Apply sunscreen liberally and repeatedly throughout the day. You should seek shade when your shadow is shorter than you. Protect yourself by wearing long sleeves, pants, a wide-brimmed hat, and sunglasses year round, whenever you are outdoors.  Once a month, do a whole body skin exam, using a mirror to look at the skin on your back. Tell your health care provider of new moles, moles that have irregular borders, moles that are larger than a pencil eraser, or moles that have changed in shape or color.  Stay current with required vaccines (immunizations).  Influenza vaccine. All adults should be immunized every year.  Tetanus, diphtheria, and acellular pertussis (Td, Tdap) vaccine. Pregnant women should receive 1 dose of Tdap vaccine during each pregnancy. The dose should be obtained regardless of the length of time since the last dose.  Immunization is preferred during the 27th-36th week of gestation. An adult who has not previously received Tdap or who does not know her vaccine status should receive 1 dose of Tdap. This initial dose should be followed by tetanus and diphtheria toxoids (Td) booster doses every 10 years. Adults with an unknown or incomplete history of completing a 3-dose immunization series with Td-containing vaccines should begin or complete a primary immunization series including a Tdap dose. Adults should receive a Td booster every 10 years.  Pneumococcal 13-valent conjugate (PCV13) vaccine. When indicated, a person who is uncertain of her immunization history and has no record of immunization should receive the PCV13 vaccine. An adult aged 59 years or older who has certain medical conditions and has not been previously immunized should receive 1 dose of PCV13 vaccine. This PCV13 should be followed with a dose of pneumococcal polysaccharide (PPSV23) vaccine. The PPSV23 vaccine dose should be obtained at least 8 weeks after the dose of PCV13 vaccine. An adult aged 23 years or older who has certain medical conditions and previously received 1 or more doses of PPSV23 vaccine should receive 1 dose of PCV13. The PCV13 vaccine dose should be obtained 1 or more years after the last PPSV23 vaccine dose.  Pneumococcal polysaccharide (PPSV23) vaccine. When PCV13 is also indicated, PCV13 should be obtained first. All adults aged 29 years and older should be immunized. An adult younger than age 60 years who has certain medical conditions should be immunized. Any person who resides in a nursing home or long-term care facility should be immunized. An adult smoker should be immunized. People with an immunocompromised condition and certain other conditions should receive both PCV13 and PPSV23 vaccines. People with human immunodeficiency virus (HIV) infection should be immunized as soon as possible after diagnosis. Immunization during  chemotherapy or radiation therapy should be avoided. Routine use of PPSV23 vaccine is not recommended for American Indians, [place]atives, or people younger than 65 years unless there are medical conditions that require PPSV23 vaccine. When indicated, people who have unknown immunization and have no record of immunization should receive PPSV23 vaccine. One-time revaccination 5 years after the first dose of PPSV23 is recommended for people aged 19-64  years who have chronic kidney failure, nephrotic syndrome, asplenia, or immunocompromised conditions. People who received 1-2 doses of PPSV23 before age 79 years should receive another dose of PPSV23 vaccine at age 52 years or later if at least 5 years have passed since the previous dose. Doses of PPSV23 are not needed for people immunized with PPSV23 at or after age 52 years. Preventive Services / Frequency  Ages 28 years and over  Blood pressure check.** / Every 1 to 2 years.  Lipid and cholesterol check.** / Every 5 years beginning at age 46 years.  Lung cancer screening. / Every year if you are aged 67-80 years and have a 30-pack-year history of smoking and currently smoke or have quit within the past 15 years. Yearly screening is stopped once you have quit smoking for at least 15 years or develop a health problem that would prevent you from having lung cancer treatment.  Clinical breast exam.** / Every year after age 25 years.  BRCA-related cancer risk assessment.** / For women who have family members with a BRCA-related cancer (breast, ovarian, tubal, or peritoneal cancers).  Mammogram.** / Every year beginning at age 26 years and continuing for as long as you are in good health. Consult with your health care provider.  Pap test.** / Every 3 years starting at age 63 years through age 24 or 66 years with 3 consecutive normal Pap tests. Testing can be stopped between 65 and 70 years with 3 consecutive normal Pap tests and no abnormal Pap or HPV  tests in the past 10 years.  HPV screening.** / Every 3 years from ages 30 years through ages 20 or 73 years with a history of 3 consecutive normal Pap tests. Testing can be stopped between 65 and 70 years with 3 consecutive normal Pap tests and no abnormal Pap or HPV tests in the past 10 years.  Fecal occult blood test (FOBT) of stool. / Every year beginning at age 75 years and continuing until age 73 years. You may not need to do this test if you get a colonoscopy every 10 years.  Flexible sigmoidoscopy or colonoscopy.** / Every 5 years for a flexible sigmoidoscopy or every 10 years for a colonoscopy beginning at age 35 years and continuing until age 39 years.  Hepatitis C blood test.** / For all people born from [birth year] through 1963 and any individual with known risks for hepatitis C.  Osteoporosis screening.** / A one-time screening for women ages 52 years and over and women at risk for fractures or osteoporosis.  Skin self-exam. / Monthly.  Influenza vaccine. / Every year.  Tetanus, diphtheria, and acellular pertussis (Tdap/Td) vaccine.** / 1 dose of Td every 10 years.  Zoster vaccine.** / 1 dose for adults aged 34 years or older.  Pneumococcal 13-valent conjugate (PCV13) vaccine.** / Consult your health care provider.  Pneumococcal polysaccharide (PPSV23) vaccine.** / 1 dose for all adults aged 64 years and older.

## 2014-03-17 LAB — VITAMIN D 25 HYDROXY (VIT D DEFICIENCY, FRACTURES): Vit D, 25-Hydroxy: 38 ng/mL (ref 30–89)

## 2014-03-17 LAB — INSULIN, FASTING: Insulin fasting, serum: 8.9 u[IU]/mL (ref 2.0–19.6)

## 2014-03-26 ENCOUNTER — Encounter: Payer: Self-pay | Admitting: Internal Medicine

## 2014-05-28 ENCOUNTER — Other Ambulatory Visit: Payer: Self-pay | Admitting: *Deleted

## 2014-05-28 MED ORDER — LEVOTHYROXINE SODIUM 150 MCG PO TABS: 150.0000 ug | ORAL_TABLET | Freq: Every day | ORAL | Status: DC

## 2014-06-22 ENCOUNTER — Ambulatory Visit: Payer: Self-pay | Admitting: Physician Assistant

## 2014-08-11 ENCOUNTER — Other Ambulatory Visit: Payer: Self-pay | Admitting: Physician Assistant

## 2014-08-11 MED ORDER — AZITHROMYCIN 250 MG PO TABS: ORAL_TABLET | ORAL | Status: DC

## 2014-09-10 ENCOUNTER — Other Ambulatory Visit: Payer: Self-pay | Admitting: Internal Medicine

## 2014-09-13 ENCOUNTER — Other Ambulatory Visit: Payer: Self-pay | Admitting: *Deleted

## 2014-09-13 MED ORDER — BENAZEPRIL HCL 40 MG PO TABS: 40.0000 mg | ORAL_TABLET | Freq: Every day | ORAL | Status: DC

## 2014-09-21 ENCOUNTER — Ambulatory Visit: Payer: Self-pay | Admitting: Internal Medicine

## 2014-10-04 ENCOUNTER — Telehealth: Payer: Self-pay | Admitting: *Deleted

## 2014-10-04 ENCOUNTER — Other Ambulatory Visit: Payer: Self-pay | Admitting: *Deleted

## 2014-10-04 ENCOUNTER — Other Ambulatory Visit: Payer: Self-pay | Admitting: Internal Medicine

## 2014-10-04 NOTE — Telephone Encounter (Signed)
Patient asked for 90 day supply of Benazepril but patient will need OV prior to refill  Patient has tabs to last until ov.

## 2014-10-05 ENCOUNTER — Ambulatory Visit (INDEPENDENT_AMBULATORY_CARE_PROVIDER_SITE_OTHER): Payer: Medicare PPO | Admitting: Internal Medicine

## 2014-10-05 VITALS — BP 106/70 | HR 44 | Temp 97.9°F | Resp 16 | Ht 63.5 in | Wt 169.2 lb

## 2014-10-05 DIAGNOSIS — E559 Vitamin D deficiency, unspecified: Secondary | ICD-10-CM

## 2014-10-05 DIAGNOSIS — E782 Mixed hyperlipidemia: Secondary | ICD-10-CM

## 2014-10-05 DIAGNOSIS — R7309 Other abnormal glucose: Secondary | ICD-10-CM

## 2014-10-05 DIAGNOSIS — R7303 Prediabetes: Secondary | ICD-10-CM

## 2014-10-05 DIAGNOSIS — Z79899 Other long term (current) drug therapy: Secondary | ICD-10-CM

## 2014-10-05 DIAGNOSIS — E039 Hypothyroidism, unspecified: Secondary | ICD-10-CM

## 2014-10-05 DIAGNOSIS — I493 Ventricular premature depolarization: Secondary | ICD-10-CM

## 2014-10-05 DIAGNOSIS — I10 Essential (primary) hypertension: Secondary | ICD-10-CM

## 2014-10-05 LAB — CBC WITH DIFFERENTIAL/PLATELET
Basophils Absolute: 0.1 10*3/uL (ref 0.0–0.1)
Basophils Relative: 1 % (ref 0–1)
Eosinophils Absolute: 0.2 10*3/uL (ref 0.0–0.7)
Eosinophils Relative: 3 % (ref 0–5)
HCT: 34.7 % — ABNORMAL LOW (ref 36.0–46.0)
Hemoglobin: 11.3 g/dL — ABNORMAL LOW (ref 12.0–15.0)
Lymphocytes Relative: 21 % (ref 12–46)
Lymphs Abs: 1.3 10*3/uL (ref 0.7–4.0)
MCH: 28 pg (ref 26.0–34.0)
MCHC: 32.6 g/dL (ref 30.0–36.0)
MCV: 85.9 fL (ref 78.0–100.0)
MPV: 11 fL (ref 8.6–12.4)
Monocytes Absolute: 0.6 10*3/uL (ref 0.1–1.0)
Monocytes Relative: 10 % (ref 3–12)
Neutro Abs: 3.9 10*3/uL (ref 1.7–7.7)
Neutrophils Relative %: 65 % (ref 43–77)
Platelets: 220 10*3/uL (ref 150–400)
RBC: 4.04 MIL/uL (ref 3.87–5.11)
RDW: 14.1 % (ref 11.5–15.5)
WBC: 6 10*3/uL (ref 4.0–10.5)

## 2014-10-05 LAB — BASIC METABOLIC PANEL WITHOUT GFR
BUN: 19 mg/dL (ref 6–23)
Calcium: 8.7 mg/dL (ref 8.4–10.5)
Chloride: 107 meq/L (ref 96–112)
GFR, Est African American: 63 mL/min
GFR, Est Non African American: 55 mL/min — ABNORMAL LOW
Potassium: 4.3 meq/L (ref 3.5–5.3)

## 2014-10-05 LAB — BASIC METABOLIC PANEL WITH GFR
CO2: 23 mEq/L (ref 19–32)
Creat: 0.98 mg/dL (ref 0.50–1.10)
Glucose, Bld: 85 mg/dL (ref 70–99)
Sodium: 140 mEq/L (ref 135–145)

## 2014-10-05 LAB — LIPID PANEL
Cholesterol: 141 mg/dL (ref 0–200)
HDL: 37 mg/dL — ABNORMAL LOW (ref >=46)
LDL Cholesterol: 82 mg/dL (ref 0–99)
Total CHOL/HDL Ratio: 3.8 ratio
Triglycerides: 110 mg/dL (ref <150)
VLDL: 22 mg/dL (ref 0–40)

## 2014-10-05 LAB — HEPATIC FUNCTION PANEL
ALT: 13 U/L (ref 0–35)
AST: 22 U/L (ref 0–37)
Albumin: 3.5 g/dL (ref 3.5–5.2)
Alkaline Phosphatase: 69 U/L (ref 39–117)
Bilirubin, Direct: 0.1 mg/dL (ref 0.0–0.3)
Indirect Bilirubin: 0.3 mg/dL (ref 0.2–1.2)
Total Bilirubin: 0.4 mg/dL (ref 0.2–1.2)
Total Protein: 6.5 g/dL (ref 6.0–8.3)

## 2014-10-05 LAB — TSH: TSH: 1.251 u[IU]/mL (ref 0.350–4.500)

## 2014-10-05 LAB — HEMOGLOBIN A1C
Hgb A1c MFr Bld: 6.1 % — ABNORMAL HIGH (ref <5.7)
Mean Plasma Glucose: 128 mg/dL — ABNORMAL HIGH (ref <117)

## 2014-10-05 LAB — MAGNESIUM: Magnesium: 1.9 mg/dL (ref 1.5–2.5)

## 2014-10-05 MED ORDER — ATENOLOL 50 MG PO TABS: 50.0000 mg | ORAL_TABLET | Freq: Every day | ORAL | Status: DC

## 2014-10-05 NOTE — Progress Notes (Signed)
Patient ID: Mikayla Harrison, female   DOB: 23-Aug-1931, 81 y.o.   MRN: [id number]   This very nice 81 y.o. [facility] presents belatedly for 3 month follow up (overdue by 3 months) with Hypertension, Hyperlipidemia, Pre-Diabetes and Vitamin D Deficiency only so she can get refills.    Patient is treated for HTN & BP has been controlled at home. Today's BP: 106/70 mmHg. Patient has had no complaints of any cardiac type chest pain,dyspnea/orthopnea/PND, dizziness, claudication, or dependent edema. He does c/o palpitations and on EKG is noted to have unifocal Bigeminal PVC's.    Hyperlipidemia is controlled with diet & meds. Patient denies myalgias or other med SE's. Last Lipids were at goal - Total Chol 141; HDL 37; LDL 82; Triglycerides 110 on 10/05/2014.   Also, the patient has history of PreDiabetes and has had no symptoms of reactive hypoglycemia, diabetic polys, paresthesias or visual blurring.  Last A1c was  6.1% on 10/05/2014.    Further, the patient also has history of Vitamin D Deficiency and supplements vitamin D without any suspected side-effects. Last vitamin D was  38 on 03/16/2014.     Medication Sig  . aspirin 81 MG tablet Take 81 mg by mouth daily.    . benazepril  40 MG tablet TAKE 1 TABLET EVERY DAY  . levothyroxine  150 MCG tablet Take 1 tablet (150 mcg total) by mouth daily.   Allergies  Allergen Reactions  . Asa [Aspirin]   . Codeine Nausea Only  . Prednisone Nausea And Vomiting  . Sulfa Antibiotics    PMHx:   Past Medical History  Diagnosis Date  . Hypertension   . Hyperlipidemia   . Hypothyroidism   . Anemia   . Iron deficiency   . Small bowel obstruction   . Colon polyp   . Diverticulosis   . Prediabetes    Immunization History  Administered Date(s) Administered  . Influenza Split 03/04/2013  . Influenza, High Dose Seasonal PF 03/16/2014  . Pneumococcal Polysaccharide-23 03/16/2014   Past Surgical History  Procedure Laterality Date  . Cataract extraction    .  Vericose vein  1960  . Macular tear     FHx:    Reviewed / unchanged  SHx:    Reviewed / unchanged  Systems Review:  Constitutional: Denies fever, chills, wt changes, headaches, insomnia, fatigue, night sweats, change in appetite. Eyes: Denies redness, blurred vision, diplopia, discharge, itchy, watery eyes.  ENT: Denies discharge, congestion, post nasal drip, epistaxis, sore throat, earache, hearing loss, dental pain, tinnitus, vertigo, sinus pain, snoring.  CV: Denies chest pain, palpitations, irregular heartbeat, syncope, dyspnea, diaphoresis, orthopnea, PND, claudication or edema. Respiratory: denies cough, dyspnea, DOE, pleurisy, hoarseness, laryngitis, wheezing.  Gastrointestinal: Denies dysphagia, odynophagia, heartburn, reflux, water brash, abdominal pain or cramps, nausea, vomiting, bloating, diarrhea, constipation, hematemesis, melena, hematochezia  or hemorrhoids. Genitourinary: Denies dysuria, frequency, urgency, nocturia, hesitancy, discharge, hematuria or flank pain. Musculoskeletal: Denies arthralgias, myalgias, stiffness, jt. swelling, pain, limping or strain/sprain.  Skin: Denies pruritus, rash, hives, warts, acne, eczema or change in skin lesion(s). Neuro: No weakness, tremor, incoordination, spasms, paresthesia or pain. Psychiatric: Denies confusion, memory loss or sensory loss. Endo: Denies change in weight, skin or hair change.  Heme/Lymph: No excessive bleeding, bruising or enlarged lymph nodes.  Physical Exam  BP 106/70  Pulse 44  Temp 97.9 F  Resp 16  Ht 5' 3.5" Wt 169 lb      BMI 29.50   Appears well nourished and in no distress. Eyes:  PERRLA, EOMs, conjunctiva no swelling or erythema. Sinuses: No frontal/maxillary tenderness ENT/Mouth: EAC's clear, TM's nl w/o erythema, bulging. Nares clear w/o erythema, swelling, exudates. Oropharynx clear without erythema or exudates. Oral hygiene is good. Tongue normal, non obstructing. Hearing intact.  Neck: Supple.  Thyroid nl. Car 2+/2+ without bruits, nodes or JVD. Chest: Respirations nl with BS clear & equal w/o rales, rhonchi, wheezing or stridor.  Cor: Heart sounds normal w/ regular rate and rhythm without sig. murmurs, gallops, clicks, or rubs. Peripheral pulses normal and equal  without edema.  Abdomen: Soft & bowel sounds normal. Non-tender w/o guarding, rebound, hernias, masses, or organomegaly.  Lymphatics: Unremarkable.  Musculoskeletal: Full ROM all peripheral extremities, joint stability, 5/5 strength, and normal gait.  Skin: Warm, dry without exposed rashes, lesions or ecchymosis apparent.  Neuro: Cranial nerves intact, reflexes equal bilaterally. Sensory-motor testing grossly intact. Tendon reflexes grossly intact.  Pysch: Alert & oriented x 3.  Insight and judgement nl & appropriate. No ideations.  Assessment and Plan:  1. Essential hypertension  - TSH  2. Hyperlipidemia  - Lipid panel  3. Prediabetes  - Hemoglobin A1c - Insulin, random  4. Vitamin D deficiency  - Vit D  25 hydroxy (rtn osteoporosis monitoring)  5. Hypothyroidism, unspecified hypothyroidism type   6. Medication management  - CBC with Differential/Platelet - BASIC METABOLIC PANEL WITH GFR - Hepatic function panel - Magnesium  7. PVC's , iunifocal bigeminal  - EKG 12-Lead - atenolol (TENORMIN) 50 MG tablet; Take 1 tablet (50 mg total) by mouth daily.  Dispense: 30 tablet; Refill: 0 & advised to cut Benazepril dose in 1/2 = 20 mg/daily.  - ROV 2 weeks for OV & EKG.   Recommended regular exercise, BP monitoring, weight control, and discussed med and SE's. Recommended labs to assess and monitor clinical status. Further disposition pending results of labs. Over 30 minutes of exam, counseling, chart review was performed

## 2014-10-05 NOTE — Patient Instructions (Signed)
Premature Beats A premature beat is an extra heartbeat that happens earlier than normal. Premature beats are called premature atrial contractions (PACs) or premature ventricular contractions (PVCs) depending on the area of the heart where they start. CAUSES  Premature beats may be brought on by a variety of factors including:  Emotional stress.  Lack of sleep.  Caffeine.  Asthma medicines.  Stimulants.  Herbal teas.  Dietary supplements.  Alcohol. In most cases, premature beats are not dangerous and are not a sign of serious heart disease. Most patients evaluated for premature beats have completely normal heart function. Rarely, premature beats may be a sign of more significant heart problems or medical illness. SYMPTOMS  Premature beats may cause palpitations. This means you feel like your heart is skipping a beat or beating harder than usual. Sometimes, slight chest pain occurs with premature beats, lasting only a few seconds. This pain has been described as a "flopping" feeling inside the chest. In many cases, premature beats do not cause any symptoms and they are only detected when an electrocardiography test (EKG) or heart monitoring is performed. DIAGNOSIS  Your caregiver may run some tests to evaluate your heart such as an EKG or echocardiography. You may need to wear a portable heart monitor for several days to record the electrical activity of your heart. Blood testing may also be performed to check your electrolytes and thyroid function. TREATMENT  Premature beats usually go away with rest. If the problem continues, your caregiver will determine a treatment plan for you.  HOME CARE INSTRUCTIONS  Get plenty of rest over the next few days until your symptoms improve.  Avoid coffee, tea, alcohol, and soda (pop, cola).  Do not smoke. SEEK MEDICAL CARE IF:  Your symptoms continue after 1 to 2 days of rest.  You have new symptoms, such as chest pain or trouble  breathing. SEEK IMMEDIATE MEDICAL CARE IF:  You have severe chest pain or abdominal pain.  You have pain that radiates into the neck, arm, or jaw.  You faint or have extreme weakness.  You have shortness of breath.  Your heartbeat races for more than 5 seconds. MAKE SURE YOU:  Understand these instructions.  Will watch your condition.  Will get help right away if you are not doing well or get worse. Document Released: 07/05/2004 Document Revised: 08/20/2011 Document Reviewed: 01/29/2011 [facility] Patient Information 2015 [place], [place]. This information is not intended to replace advice given to you by your health care provider. Make sure you discuss any questions you have with your health care provider.

## 2014-10-06 ENCOUNTER — Encounter: Payer: Self-pay | Admitting: Internal Medicine

## 2014-10-06 LAB — INSULIN, RANDOM: Insulin: 9.8 u[IU]/mL (ref 2.0–19.6)

## 2014-10-06 LAB — VITAMIN D 25 HYDROXY (VIT D DEFICIENCY, FRACTURES): Vit D, 25-Hydroxy: 29 ng/mL — ABNORMAL LOW (ref 30–100)

## 2014-10-19 ENCOUNTER — Ambulatory Visit (INDEPENDENT_AMBULATORY_CARE_PROVIDER_SITE_OTHER): Payer: Medicare PPO | Admitting: Internal Medicine

## 2014-10-19 ENCOUNTER — Encounter: Payer: Self-pay | Admitting: Internal Medicine

## 2014-10-19 VITALS — BP 116/62 | HR 60 | Temp 97.0°F | Resp 16 | Ht 63.5 in | Wt 169.4 lb

## 2014-10-19 DIAGNOSIS — I493 Ventricular premature depolarization: Secondary | ICD-10-CM

## 2014-10-19 DIAGNOSIS — I10 Essential (primary) hypertension: Secondary | ICD-10-CM

## 2014-10-20 ENCOUNTER — Encounter: Payer: Self-pay | Admitting: Internal Medicine

## 2014-10-20 NOTE — Progress Notes (Signed)
   Subjective:    Patient ID: Mikayla Harrison, female    DOB: 07/02/1931, 81 y.o.   MRN: [id number]  HPI  This very nice 81 yo WWF with HTN presents for recheck after recently starting (adding) atenolol for unifocal PVB's and concomitant c/o "flutters" in the chest. Patient has had no CP or dyspnea - exertional or otherwise and no orthopnea/PND or problems with dependent edema. Since starting low dose Atenolol she reports "No more " flutters" and does report some mild postural lightheadedness with rapid standing.   Medication Sig  . aspirin 81 MG tablet Take 81 mg by mouth daily.    Marland Kitchen atenolol  50 MG tablet Take 1 tablet (50 mg total) by mouth daily.  . benazepril  40 MG tablet TAKE 1 TABLET EVERY DAY  . levothyroxine  150 MCG tablet Take 1 tablet (150 mcg total) by mouth daily.   Allergies  Allergen Reactions  . Asa [Aspirin]   . Codeine Nausea Only  . Prednisone Nausea And Vomiting  . Sulfa Antibiotics    Past Medical History  Diagnosis Date  . Hypertension   . Hyperlipidemia   . Hypothyroidism   . Anemia   . Iron deficiency   . Small bowel obstruction   . Colon polyp   . Diverticulosis   . Prediabetes    Review of Systems  10 point systems review negative except as above.    Objective:   Physical Exam  BP 116/62   Pulse 60  Temp 97 F (  Resp 16  Ht 5' 3.5"   Wt 169 lb 6.4 oz     BMI 29.53   HEENT - Eac's patent. TM's Nl. EOM's full. PERRLA. NasoOroPharynx clear. Neck - supple. Nl Thyroid. Carotids 2+ & No bruits, nodes, JVD Chest - Clear equal BS w/o Rales, rhonchi, wheezes. Cor - Nl HS. RRR w/o sig MGR. PP 1(+). No edema. Abd - No palpable organomegaly, masses or tenderness. BS nl. MS- FROM w/o deformities. Muscle power, tone and bulk Nl. Gait Nl. Neuro - No obvious Cr N abnormalities. Sensory, motor and Cerebellar functions appear Nl w/o focal abnormalities. Psyche - Mental status normal & appropriate.  No delusions, ideations or obvious mood abnormalities.     Assessment & Plan:   1. Essential hypertension  2. PVC's (premature ventricular contractions)  - Given EKG showed SB @ 46 bpm, patient was advised to stop her atenolol and given sx's Verapamil 120 mg # 10 and advised to take 1/2 tab = 60 mg bid and return in 1 week for recheck.   - discussed meds/SE's.

## 2014-10-24 ENCOUNTER — Other Ambulatory Visit: Payer: Self-pay | Admitting: Internal Medicine

## 2014-10-24 MED ORDER — DOXYCYCLINE HYCLATE 100 MG PO CAPS: ORAL_CAPSULE | ORAL | Status: DC

## 2014-10-26 ENCOUNTER — Ambulatory Visit (INDEPENDENT_AMBULATORY_CARE_PROVIDER_SITE_OTHER): Payer: Medicare PPO | Admitting: Internal Medicine

## 2014-10-26 ENCOUNTER — Encounter: Payer: Self-pay | Admitting: Internal Medicine

## 2014-10-26 VITALS — BP 156/82 | HR 64 | Temp 97.9°F | Resp 16 | Ht 63.5 in | Wt 169.4 lb

## 2014-10-26 DIAGNOSIS — I10 Essential (primary) hypertension: Secondary | ICD-10-CM

## 2014-10-26 DIAGNOSIS — R002 Palpitations: Secondary | ICD-10-CM

## 2014-10-26 MED ORDER — VERAPAMIL HCL ER 240 MG PO TBCR: 240.0000 mg | EXTENDED_RELEASE_TABLET | Freq: Every day | ORAL | Status: DC

## 2014-10-26 NOTE — Progress Notes (Signed)
   Subjective:    Patient ID: Mikayla Harrison, female    DOB: 1932/01/31, 81 y.o.   MRN: [id number]  HPI Patient returns today with c/o "fast heart beat". She's recently been switched from low dose beta blocker to a low dose of verapamil because of bradycardia and slight light-headedness on her beta blocker. She denies any CP or chest discomfort or dyspnea.. Several days ago over the weekend she had Doxycycline called in for a suspected spiderbite of her left forearm.   Medication Sig  . aspirin 81 MG tablet Take 81 mg by mouth daily.    Marland Kitchen atenolol  50 MG tablet Take 1 tablet (50 mg total) by mouth daily.  . benazepril  40 MG tablet TAKE 1 TABLET EVERY DAY  . doxycycline  100 MG capsule Take 2 capsules at once on a full stomach, then take 1 capsule  1 x day on a full stomach for infection for 1 week  . levothyroxine 150 MCG tablet Take 1 tablet (150 mcg total) by mouth daily.   Allergies  Allergen Reactions  . Asa [Aspirin]   . Codeine Nausea Only  . Prednisone Nausea And Vomiting  . Sulfa Antibiotics    Past Medical History  Diagnosis Date  . Hypertension   . Hyperlipidemia   . Hypothyroidism   . Anemia   . Iron deficiency   . Small bowel obstruction   . Colon polyp   . Diverticulosis   . Prediabetes    Review of Systems  10 point systems review negative except as above.    Objective:   Physical Exam  BP 156/82 mmHg  Pulse 64  Temp(Src) 97.9 F (36.6 C)  Resp 16  Ht 5' 3.5" (1.613 m)  Wt 169 lb 6.4 oz (76.839 kg)  BMI 29.53 kg/m2  HEENT - Eac's patent. TM's Nl. EOM's full. PERRLA. NasoOroPharynx clear. Neck - supple. Nl Thyroid. Carotids 2+ & No bruits, nodes, JVD Chest - Clear equal BS w/o Rales, rhonchi, wheezes. Cor - Nl HS. RRR with occas irregularity in rhythm w/o sig MGR. PP 1(+). No edema. Abd - No palpable organomegaly, masses or tenderness. BS nl. MS- FROM w/o deformities. Muscle power, tone and bulk Nl. Gait Nl. Skin - Sl tender "thickening" of the mid  lateral left forearm w/o signs of cellulitis or lymphangitis.  Neuro - No obvious Cr N abnormalities. Sensory, motor and Cerebellar functions appear Nl w/o focal abnormalities. Psyche - Mental status normal & appropriate.  No delusions, ideations or obvious mood abnormalities.  12 lead EKG shows sr with iRBBB and occas PAB's . No acute changes.    Assessment & Plan:   1. Essential hypertension   2. Palpitations  - EKG 12-Lead  - Rx Verapamil 240 mg SR # 30 x 6 rf   - ROV 1 week for OV & EKG

## 2014-10-26 NOTE — Patient Instructions (Signed)

## 2014-10-27 ENCOUNTER — Ambulatory Visit: Payer: Self-pay | Admitting: Internal Medicine

## 2014-10-31 NOTE — Addendum Note (Signed)
Addended by: Akiel Fennell A on: 10/31/2014 09:58 AM   Modules accepted: Orders

## 2014-11-02 ENCOUNTER — Ambulatory Visit (INDEPENDENT_AMBULATORY_CARE_PROVIDER_SITE_OTHER): Payer: Medicare PPO | Admitting: Internal Medicine

## 2014-11-02 ENCOUNTER — Encounter: Payer: Self-pay | Admitting: Internal Medicine

## 2014-11-02 VITALS — BP 132/78 | HR 60 | Temp 98.0°F | Resp 18 | Ht 63.5 in | Wt 171.2 lb

## 2014-11-02 DIAGNOSIS — I10 Essential (primary) hypertension: Secondary | ICD-10-CM

## 2014-11-02 DIAGNOSIS — I493 Ventricular premature depolarization: Secondary | ICD-10-CM | POA: Insufficient documentation

## 2014-11-02 NOTE — Progress Notes (Signed)
   Subjective:    Patient ID: Mikayla Harrison, female    DOB: Jul 26, 1931, 81 y.o.   MRN: [id number]  HPI This very nice 81 yo WWF with longstanding HTN returns for f/u of symptomatic palpitations which were due to unifocal VPB's. Since most recent med change 1 week ago with a little higher dose of Verapamil LA, she has done much better with resolution of her pvc's and no c/o postural dizziness. Denies CP.  Medication Sig  . aspirin 81 MG tablet Take 81 mg by mouth daily.    Marland Kitchen atenolol (TENORMIN) 50 MG tablet Take 1 tablet (50 mg total) by mouth daily.  . benazepril (LOTENSIN) 40 MG tablet TAKE 1 TABLET EVERY DAY  . doxycycline (VIBRAMYCIN) 100 MG capsule Take 2 capsules at once on a full stomach, then take 1 capsule  1 x day on a full stomach for infection for 1 week  . levothyroxine  150 MCG tablet Take 1 tablet (150 mcg total) by mouth daily.  . verapamil -SR 240 MG CR tablet Take 1 tablet (240 mg total) by mouth daily.   Allergies  Allergen Reactions  . Asa [Aspirin]   . Codeine Nausea Only  . Prednisone Nausea And Vomiting  . Sulfa Antibiotics    Review of Systems In addition to the HPI above,  No Fever-chills,  No Headache, No changes with Vision or hearing,  No problems swallowing food or Liquids,  No Chest pain or productive Cough or Shortness of Breath,  No Abdominal pain, No Nausea or Vomitting, Bowel movements are regular,  No Blood in stool or Urine,  No dysuria,  No new skin rashes or bruises,  No new joints pains-aches,  No new weakness, tingling, numbness in any extremity,  No recent weight loss,  No polyuria, polydypsia or polyphagia,  No significant Mental Stressors.  A full 10 point Review of Systems was done, except as stated above, all other Review of Systems were negative    Objective:   Physical Exam   BP 132/78 mmHg  Pulse 60  Temp(Src) 98 F (36.7 C)  Resp 18  Ht 5' 3.5" (1.613 m)  Wt 171 lb 3.2 oz (77.656 kg)  BMI 29.85 kg/m2   HEENT - Eac's patent.  TM's Nl. EOM's full. PERRLA. NasoOroPharynx clear. Neck - supple. Nl Thyroid. Carotids 2+ & No bruits, nodes, JVD Chest - Clear equal BS w/o Rales, rhonchi, wheezes. Cor - Nl HS. RRR w/o sig MGR. PP 1(+). No edema. MS- FROM w/o deformities. Muscle power, tone and bulk Nl. Gait Nl. Neuro - No obvious Cr N abnormalities. Sensory, motor and Cerebellar functions appear Nl w/o focal abnormalities. Psyche - Mental status normal & appropriate.  No delusions, ideations or obvious mood abnormalities.  12 lead EKG shows NSR w/o any significant abnormalities.    Assessment & Plan:   1. Essential hypertension   2. Unifocal PVCs  - EKG 12-Lead  - Continue meds same - discussing meds/SE's.

## 2014-11-16 ENCOUNTER — Encounter: Payer: Self-pay | Admitting: Physician Assistant

## 2014-11-16 ENCOUNTER — Ambulatory Visit (INDEPENDENT_AMBULATORY_CARE_PROVIDER_SITE_OTHER): Payer: Medicare PPO | Admitting: Physician Assistant

## 2014-11-16 VITALS — BP 128/62 | HR 64 | Temp 97.7°F | Resp 16 | Ht 63.5 in | Wt 168.0 lb

## 2014-11-16 DIAGNOSIS — Z79899 Other long term (current) drug therapy: Secondary | ICD-10-CM

## 2014-11-16 DIAGNOSIS — R7309 Other abnormal glucose: Secondary | ICD-10-CM

## 2014-11-16 DIAGNOSIS — E2839 Other primary ovarian failure: Secondary | ICD-10-CM

## 2014-11-16 DIAGNOSIS — Z0001 Encounter for general adult medical examination with abnormal findings: Secondary | ICD-10-CM

## 2014-11-16 DIAGNOSIS — N183 Chronic kidney disease, stage 3 unspecified: Secondary | ICD-10-CM

## 2014-11-16 DIAGNOSIS — I493 Ventricular premature depolarization: Secondary | ICD-10-CM

## 2014-11-16 DIAGNOSIS — E782 Mixed hyperlipidemia: Secondary | ICD-10-CM

## 2014-11-16 DIAGNOSIS — Z9181 History of falling: Secondary | ICD-10-CM

## 2014-11-16 DIAGNOSIS — E039 Hypothyroidism, unspecified: Secondary | ICD-10-CM

## 2014-11-16 DIAGNOSIS — E559 Vitamin D deficiency, unspecified: Secondary | ICD-10-CM

## 2014-11-16 DIAGNOSIS — K5790 Diverticulosis of intestine, part unspecified, without perforation or abscess without bleeding: Secondary | ICD-10-CM

## 2014-11-16 DIAGNOSIS — R6889 Other general symptoms and signs: Secondary | ICD-10-CM

## 2014-11-16 DIAGNOSIS — R7303 Prediabetes: Secondary | ICD-10-CM

## 2014-11-16 DIAGNOSIS — I10 Essential (primary) hypertension: Secondary | ICD-10-CM

## 2014-11-16 LAB — BASIC METABOLIC PANEL WITH GFR
CO2: 25 mEq/L (ref 19–32)
Creat: 1.02 mg/dL (ref 0.50–1.10)
GFR, Est African American: 60 mL/min
GFR, Est Non African American: 52 mL/min — ABNORMAL LOW
Potassium: 4.2 mEq/L (ref 3.5–5.3)

## 2014-11-16 LAB — CBC WITH DIFFERENTIAL/PLATELET
Basophils Absolute: 0.1 10*3/uL (ref 0.0–0.1)
Basophils Relative: 1 % (ref 0–1)
Eosinophils Absolute: 0.3 10*3/uL (ref 0.0–0.7)
Eosinophils Relative: 3 % (ref 0–5)
HCT: 33.9 % — ABNORMAL LOW (ref 36.0–46.0)
Hemoglobin: 11.2 g/dL — ABNORMAL LOW (ref 12.0–15.0)
Lymphocytes Relative: 21 % (ref 12–46)
Lymphs Abs: 2 10*3/uL (ref 0.7–4.0)
MCH: 28 pg (ref 26.0–34.0)
MCHC: 33 g/dL (ref 30.0–36.0)
MCV: 84.8 fL (ref 78.0–100.0)
MPV: 10.6 fL (ref 8.6–12.4)
Monocytes Absolute: 0.8 10*3/uL (ref 0.1–1.0)
Monocytes Relative: 8 % (ref 3–12)
Neutro Abs: 6.4 10*3/uL (ref 1.7–7.7)
Neutrophils Relative %: 67 % (ref 43–77)
Platelets: 289 10*3/uL (ref 150–400)
RBC: 4 MIL/uL (ref 3.87–5.11)
RDW: 14 % (ref 11.5–15.5)
WBC: 9.6 10*3/uL (ref 4.0–10.5)

## 2014-11-16 LAB — BASIC METABOLIC PANEL WITHOUT GFR
BUN: 21 mg/dL (ref 6–23)
Calcium: 9 mg/dL (ref 8.4–10.5)
Chloride: 101 meq/L (ref 96–112)
Glucose, Bld: 100 mg/dL — ABNORMAL HIGH (ref 70–99)
Sodium: 135 meq/L (ref 135–145)

## 2014-11-16 LAB — MAGNESIUM: Magnesium: 2 mg/dL (ref 1.5–2.5)

## 2014-11-16 NOTE — Progress Notes (Signed)
MEDICARE ANNUAL WELLNESS VISIT AND FOLLOW UP  Assessment:   1. Essential hypertension - continue medications but cut verapamil in half, DASH diet, exercise and monitor at home. Call if greater than 130/80.  - CBC with Differential/Platelet  2. Prediabetes Discussed general issues about diabetes pathophysiology and management., Educational material distributed., Suggested low cholesterol diet., Encouraged aerobic exercise., Discussed foot care., Reminded to get yearly retinal exam. - HM DIABETES FOOT EXAM  3. CKD Stage III (GFR 56 ml/min) Increase fluids, avoid NSAIDS, monitor sugars, will monitor - BASIC METABOLIC PANEL WITH GFR  4. Hyperlipidemia -continue medications, check lipids, decrease fatty foods, increase activity.   5. Medication management - Magnesium  6. Vitamin D deficiency Continue supplement  7. Hypothyroidism, unspecified hypothyroidism type Hypothyroidism-check TSH level, continue medications the same, reminded to take on an empty stomach 30-7mins before food.   8. Unifocal PVCs Cut the verapamil in half, monitor foods, check labs  9. Diverticulosis of intestine without bleeding, unspecified intestinal tract location Controlled, diet  10. Estrogen deficiency - DG Bone Density; Future  Over 30 minutes of exam, counseling, chart review, and critical decision making was performed  Plan:   During the course of the visit the patient was educated and counseled about appropriate screening and preventive services including:    Pneumococcal vaccine   Influenza vaccine  Td vaccine  Screening electrocardiogram  Screening mammography  Bone densitometry screening  Colorectal cancer screening  Diabetes screening  Glaucoma screening  Nutrition counseling   Advanced directives: given info/requested  Conditions/risks identified: Diabetes is at goal, ACE/ARB therapy: Yes. Urinary Incontinence is not an issue: discussed non pharmacology and  pharmacology options.  Fall risk: low- discussed PT, home fall assessment, medications.    Subjective:   WILLO Harrison is a 81 y.o. female who presents for Medicare Annual Wellness Visit and 3 month follow up on hypertension, prediabetes, hyperlipidemia, vitamin D def.  Date of last medicare wellness visit is unknown.   Her blood pressure has been controlled at home, today their BP is BP: 128/62 mmHg. She has CKD stage III due to HTN.  She does workout. She denies chest pain, shortness of breath, dizziness.  So last visit she was found to have bigeminal PVCs, her atenolol was stopped and she was put on verapamil, she states that since that time she has felt dizzy tired, fatigue, and unable to tolerate exertion. She denies before this having any palpitations, CP, SOB. She is not on cholesterol medication and denies myalgias. Her cholesterol is at goal. The cholesterol last visit was:   Lab Results  Component Value Date   CHOL 141 10/05/2014   HDL 37* 10/05/2014   LDLCALC 82 10/05/2014   TRIG 110 10/05/2014   CHOLHDL 3.8 10/05/2014   She has been working on diet and exercise for prediabetes, and denies paresthesia of the feet, polydipsia, polyuria and visual disturbances. She is on bASA.  Last A1C in the office was:  Lab Results  Component Value Date   HGBA1C 6.1* 10/05/2014   Patient is on Vitamin D supplement. Lab Results  Component Value Date   VD25OH 29* 10/05/2014     She is on thyroid medication. Her medication was not changed last visit.   Lab Results  Component Value Date   TSH 1.251 10/05/2014    Medication Review Current Outpatient Prescriptions on File Prior to Visit  Medication Sig Dispense Refill  . aspirin 81 MG tablet Take 81 mg by mouth daily.      Marland Kitchen  atenolol (TENORMIN) 50 MG tablet Take 1 tablet (50 mg total) by mouth daily. 30 tablet 0  . benazepril (LOTENSIN) 40 MG tablet TAKE 1 TABLET EVERY DAY 90 tablet 0  . doxycycline (VIBRAMYCIN) 100 MG capsule Take 2  capsules at once on a full stomach, then take 1 capsule  1 x day on a full stomach for infection for 1 week 8 capsule 0  . levothyroxine (SYNTHROID, LEVOTHROID) 150 MCG tablet Take 1 tablet (150 mcg total) by mouth daily. 90 tablet 4  . verapamil (CALAN-SR) 240 MG CR tablet Take 1 tablet (240 mg total) by mouth daily. 30 tablet 11   No current facility-administered medications on file prior to visit.    Current Problems (verified) Patient Active Problem List   Diagnosis Date Noted  . 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 CKD Stage III (GFR 56 ml/min) 03/16/2014  . Hyperlipidemia 10/05/2013  . Medication management 10/05/2013  . Vitamin D deficiency 10/05/2013  . Prediabetes   . Diverticulosis   . Hypertension 01/14/2012  . Hypothyroidism 01/14/2012    Screening Tests Immunization History  Administered Date(s) Administered  . Influenza Split 03/04/2013  . Influenza, High Dose Seasonal PF 03/16/2014  . Pneumococcal Polysaccharide-23 03/16/2014    Preventative care: Last colonoscopy:  2004, will not get another EGD: 2012, Dr. Henrene Pastor Last mammogram: 2011, declines another and declines breast exam at this time.  Last pap smear/pelvic exam: remote DEXA:remote at Dr. Delanna Ahmadi office, willing to get  Prior vaccinations: TD or Tdap: remote, declines  Influenza: 2015 Pneumococcal: 03/2014 Prevnar13: Due 03/2015 Shingles/Zostavax: declines  Names of Other Physician/Practitioners you currently use: 1. [facility]- here for primary care 2. Dr. Delman Cheadle, eye doctor, last visit 2-3 years 3. Dr. Derenda Mis, dentist, last visit q year 4. Dr. Henrene Pastor, GI Patient Care Team: [name], MD as PCP - General (Internal Medicine)  Past Surgical History  Procedure Laterality Date  . Cataract extraction    . Vericose vein  1960  . Macular tear     Family History  Problem Relation Age of Onset  . Heart disease Mother   . Heart disease Father   . Colon polyps  Sister   . Diabetes Sister   . Heart disease Brother    History  Substance Use Topics  . Smoking status: Former Smoker -- 1.00 packs/day for 15 years    Quit date: 06/11/1965  . Smokeless tobacco: Never Used  . Alcohol Use: No    MEDICARE WELLNESS OBJECTIVES: Tobacco use: She does not smoke.  Patient is a former smoker. Alcohol Current alcohol use: none Caffeine Current caffeine use: denies use Osteoporosis: postmenopausal estrogen deficiency and dietary calcium and/or vitamin D deficiency, History of fracture in the past year: no Diet: in general, a "healthy" diet   Physical activity: yard work and walking Depression/mood screen:   Depression screen [facility] 2/9 11/16/2014  Decreased Interest 0  Down, Depressed, Hopeless 0  PHQ - 2 Score 0   Hearing: normal Visual acuity: impaired,  does not perform annual eye exam  ADLs:  In your present state of health, do you have any difficulty performing the following activities: 11/16/2014 03/16/2014  Hearing? N N  Vision? Y N  Difficulty concentrating or making decisions? N N  Walking or climbing stairs? N N  Dressing or bathing? N N  Doing errands, shopping? N N  Preparing Food and eating ? N -  Using the Toilet? N -  In the past six months, have  you accidently leaked urine? N -  Do you have problems with loss of bowel control? N -  Managing your Medications? N -  Managing your Finances? N -  Housekeeping or managing your Housekeeping? N -    Fall risk: Low Risk Cognitive Testing  Alert? Yes  Normal Appearance?Yes  Oriented to person? Yes  Place? Yes   Time? Yes  Recall of three objects?  Yes  Can perform simple calculations? Yes  Displays appropriate judgment?Yes  Can read the correct time from a watch face?Yes EOL planning: Does patient have an advance directive?: No Would patient like information on creating an advanced directive?: No - patient declined information     Objective:   Blood pressure 128/62, pulse 64,  temperature 97.7 F (36.5 C), resp. rate 16, height 5' 3.5" (1.613 m), weight 168 lb (76.204 kg). Body mass index is 29.29 kg/(m^2).  General appearance: alert, no distress, WD/WN,  female HEENT: normocephalic, sclerae anicteric, TMs pearly, nares patent, no discharge or erythema, pharynx normal Oral cavity: MMM, no lesions Neck: supple, no lymphadenopathy, no thyromegaly, no masses Heart: RRR, normal S1, S2, no murmurs Lungs: CTA bilaterally, no wheezes, rhonchi, or rales Abdomen: +bs, soft, non tender, non distended, no masses, no hepatomegaly, no splenomegaly Musculoskeletal: nontender, no swelling, no obvious deformity Extremities: no edema, no cyanosis, no clubbing Pulses: 2+ symmetric, upper and lower extremities, normal cap refill Neurological: alert, oriented x 3, CN2-12 intact, strength normal upper extremities and lower extremities, sensation normal throughout, DTRs 2+ throughout, no cerebellar signs, gait normal Psychiatric: normal affect, behavior normal, pleasant  Breast: defer Gyn: defer Rectal: defer   Medicare Attestation I have personally reviewed: The patient's medical and social history Their use of alcohol, tobacco or illicit drugs Their current medications and supplements The patient's functional ability including ADLs,fall risks, home safety risks, cognitive, and hearing and visual impairment Diet and physical activities Evidence for depression or mood disorders  The patient's weight, height, BMI, and visual acuity have been recorded in the chart.  I have made referrals, counseling, and provided education to the patient based on review of the above and I have provided the patient with a written personalized care plan for preventive services.     Vicie Mutters, PA-C   11/16/2014

## 2014-11-16 NOTE — Patient Instructions (Addendum)
Cut verapamil in half  Tumeric with black pepper extract is a great natural antiinflammatory that helps with arthritis and aches and pain. Can get from [employer] or any health food store. Need to take at least $Remove'800mg'[id number]$  twice a day with food.   Varicose Veins Varicose veins are veins that have become enlarged and twisted. CAUSES This condition is the result of valves in the veins not working properly. Valves in the veins help return blood from the leg to the heart. When your calf muscles squeeze, the blood moves up your leg then the valves close and this continues until the blood gets back to your heart.  If these valves are damaged, blood flows backwards and backs up into the veins in the leg near the skin OR if your are sitting/standing for a long time without using your calf muscles the blood will back up into the veins in your legs. This causes the veins to become larger. People who are on their feet a lot, sit a lot without walking (like on a plane, at a desk, or in a car), who are pregnant, or who are overweight are more likely to develop varicose veins. SYMPTOMS   Bulging, twisted-appearing, bluish veins, most commonly found on the legs.  Leg pain or a feeling of heaviness. These symptoms may be worse at the end of the day.  Leg swelling.  Skin color changes. DIAGNOSIS  Varicose veins can usually be diagnosed with an exam of your legs by your caregiver. He or she may recommend an ultrasound of your leg veins. TREATMENT  Most varicose veins can be treated at home.However, other treatments are available for people who have persistent symptoms or who want to treat the cosmetic appearance of the varicose veins. But this is only cosmetic and they will return if not properly treated. These include:  Laser treatment of very small varicose veins.  Medicine that is shot (injected) into the vein. This medicine hardens the walls of the vein and closes off the vein. This treatment is called  sclerotherapy. Afterwards, you may need to wear clothing or bandages that apply pressure.  Surgery. HOME CARE INSTRUCTIONS   Do not stand or sit in one position for long periods of time. Do not sit with your legs crossed. Rest with your legs raised during the day.  Your legs have to be higher than your heart so that gravity will force the valves to open, so please really elevate your legs.   Wear elastic stockings or support hose. Do not wear other tight, encircling garments around the legs, pelvis, or waist.  ELASTIC THERAPY  has a wide variety of well priced compression stockings. [place], [place] [id number] [id number] [place] ARE COPPER INFUSED COMPRESSION SOCKS AT [facility] OR [facility]  Walk as much as possible to increase blood flow.  Raise the foot of your bed at night with 2-inch blocks.  If you get a cut in the skin over the vein and the vein bleeds, lie down with your leg raised and press on it with a clean cloth until the bleeding stops. Then place a bandage (dressing) on the cut. See your caregiver if it continues to bleed or needs stitches. SEEK MEDICAL CARE IF:   The skin around your ankle starts to break down.  You have pain, redness, tenderness, or hard swelling developing in your leg over a vein.  You are uncomfortable due to leg pain. Document Released: 03/07/2005 Document Revised: 08/20/2011 Document  Reviewed: 07/24/2010 ExitCare Patient Information 2014 [place], [place].  Preventive Care for Adults A healthy lifestyle and preventive care can promote health and wellness. Preventive health guidelines for women include the following key practices.  A routine yearly physical is a good way to check with your health care provider about your health and preventive screening. It is a chance to share any concerns and updates on your health and to receive a thorough exam.  Visit your dentist for a routine exam and preventive care every 6 months. Brush your teeth  twice a day and floss once a day. Good oral hygiene prevents tooth decay and gum disease.  The frequency of eye exams is based on your age, health, family medical history, use of contact lenses, and other factors. Follow your health care provider's recommendations for frequency of eye exams.  Eat a healthy diet. Foods like vegetables, fruits, whole grains, low-fat dairy products, and lean protein foods contain the nutrients you need without too many calories. Decrease your intake of foods high in solid fats, added sugars, and salt. Eat the right amount of calories for you.Get information about a proper diet from your health care provider, if necessary.  Regular physical exercise is one of the most important things you can do for your health. Most adults should get at least 150 minutes of moderate-intensity exercise (any activity that increases your heart rate and causes you to sweat) each week. In addition, most adults need muscle-strengthening exercises on 2 or more days a week.  Maintain a healthy weight. The body mass index (BMI) is a screening tool to identify possible weight problems. It provides an estimate of body fat based on height and weight. Your health care provider can find your BMI and can help you achieve or maintain a healthy weight.For adults 20 years and older:  A BMI below 18.5 is considered underweight.  A BMI of 18.5 to 24.9 is normal.  A BMI of 25 to 29.9 is considered overweight.  A BMI of 30 and above is considered obese.  Maintain normal blood lipids and cholesterol levels by exercising and minimizing your intake of saturated fat. Eat a balanced diet with plenty of fruit and vegetables. If your lipid or cholesterol levels are high, you are over 50, or you are at high risk for heart disease, you may need your cholesterol levels checked more frequently.Ongoing high lipid and cholesterol levels should be treated with medicines if diet and exercise are not working.  If you  smoke, find out from your health care provider how to quit. If you do not use tobacco, do not start.  Lung cancer screening is recommended for adults aged 61-80 years who are at high risk for developing lung cancer because of a history of smoking. A yearly low-dose CT scan of the lungs is recommended for people who have at least a 30-pack-year history of smoking and are a current smoker or have quit within the past 15 years. A pack year of smoking is smoking an average of 1 pack of cigarettes a day for 1 year (for example: 1 pack a day for 30 years or 2 packs a day for 15 years). Yearly screening should continue until the smoker has stopped smoking for at least 15 years. Yearly screening should be stopped for people who develop a health problem that would prevent them from having lung cancer treatment.  Avoid use of street drugs. Do not share needles with anyone. Ask for help if you need support or  instructions about stopping the use of drugs.  High blood pressure causes heart disease and increases the risk of stroke.  Ongoing high blood pressure should be treated with medicines if weight loss and exercise do not work.  If you are 40-2 years old, ask your health care provider if you should take aspirin to prevent strokes.  Diabetes screening involves taking a blood sample to check your fasting blood sugar level. This should be done once every 3 years, after age 69, if you are within normal weight and without risk factors for diabetes. Testing should be considered at a younger age or be carried out more frequently if you are overweight and have at least 1 risk factor for diabetes.  Breast cancer screening is essential preventive care for women. You should practice "breast self-awareness." This means understanding the normal appearance and feel of your breasts and may include breast self-examination. Any changes detected, no matter how small, should be reported to a health care provider. Women in their  61s and 30s should have a clinical breast exam (CBE) by a health care provider as part of a regular health exam every 1 to 3 years. After age 61, women should have a CBE every year. Starting at age 37, women should consider having a mammogram (breast X-ray test) every year. Women who have a family history of breast cancer should talk to their health care provider about genetic screening. Women at a high risk of breast cancer should talk to their health care providers about having an MRI and a mammogram every year.  Breast cancer gene (BRCA)-related cancer risk assessment is recommended for women who have family members with BRCA-related cancers. BRCA-related cancers include breast, ovarian, tubal, and peritoneal cancers. Having family members with these cancers may be associated with an increased risk for harmful changes (mutations) in the breast cancer genes BRCA1 and BRCA2. Results of the assessment will determine the need for genetic counseling and BRCA1 and BRCA2 testing.  Routine pelvic exams to screen for cancer are no longer recommended for nonpregnant women who are considered low risk for cancer of the pelvic organs (ovaries, uterus, and vagina) and who do not have symptoms. Ask your health care provider if a screening pelvic exam is right for you.  If you have had past treatment for cervical cancer or a condition that could lead to cancer, you need Pap tests and screening for cancer for at least 20 years after your treatment. If Pap tests have been discontinued, your risk factors (such as having a new sexual partner) need to be reassessed to determine if screening should be resumed. Some women have medical problems that increase the chance of getting cervical cancer. In these cases, your health care provider may recommend more frequent screening and Pap tests.    Colorectal cancer can be detected and often prevented. Most routine colorectal cancer screening begins at the age of 59 years and  continues through age 21 years. However, your health care provider may recommend screening at an earlier age if you have risk factors for colon cancer. On a yearly basis, your health care provider may provide home test kits to check for hidden blood in the stool. Use of a small camera at the end of a tube, to directly examine the colon (sigmoidoscopy or colonoscopy), can detect the earliest forms of colorectal cancer. Talk to your health care provider about this at age 25, when routine screening begins. Direct exam of the colon should be repeated every 5-10 years  through age 19 years, unless early forms of pre-cancerous polyps or small growths are found.  Osteoporosis is a disease in which the bones lose minerals and strength with aging. This can result in serious bone fractures or breaks. The risk of osteoporosis can be identified using a bone density scan. Women ages 16 years and over and women at risk for fractures or osteoporosis should discuss screening with their health care providers. Ask your health care provider whether you should take a calcium supplement or vitamin D to reduce the rate of osteoporosis.  Menopause can be associated with physical symptoms and risks. Hormone replacement therapy is available to decrease symptoms and risks. You should talk to your health care provider about whether hormone replacement therapy is right for you.  Use sunscreen. Apply sunscreen liberally and repeatedly throughout the day. You should seek shade when your shadow is shorter than you. Protect yourself by wearing long sleeves, pants, a wide-brimmed hat, and sunglasses year round, whenever you are outdoors.  Once a month, do a whole body skin exam, using a mirror to look at the skin on your back. Tell your health care provider of new moles, moles that have irregular borders, moles that are larger than a pencil eraser, or moles that have changed in shape or color.  Stay current with required vaccines  (immunizations).  Influenza vaccine. All adults should be immunized every year.  Tetanus, diphtheria, and acellular pertussis (Td, Tdap) vaccine. Pregnant women should receive 1 dose of Tdap vaccine during each pregnancy. The dose should be obtained regardless of the length of time since the last dose. Immunization is preferred during the 27th-36th week of gestation. An adult who has not previously received Tdap or who does not know her vaccine status should receive 1 dose of Tdap. This initial dose should be followed by tetanus and diphtheria toxoids (Td) booster doses every 10 years. Adults with an unknown or incomplete history of completing a 3-dose immunization series with Td-containing vaccines should begin or complete a primary immunization series including a Tdap dose. Adults should receive a Td booster every 10 years.    Zoster vaccine. One dose is recommended for adults aged 19 years or older unless certain conditions are present.    Pneumococcal 13-valent conjugate (PCV13) vaccine. When indicated, a person who is uncertain of her immunization history and has no record of immunization should receive the PCV13 vaccine. An adult aged 24 years or older who has certain medical conditions and has not been previously immunized should receive 1 dose of PCV13 vaccine. This PCV13 should be followed with a dose of pneumococcal polysaccharide (PPSV23) vaccine. The PPSV23 vaccine dose should be obtained at least 8 weeks after the dose of PCV13 vaccine. An adult aged 47 years or older who has certain medical conditions and previously received 1 or more doses of PPSV23 vaccine should receive 1 dose of PCV13. The PCV13 vaccine dose should be obtained 1 or more years after the last PPSV23 vaccine dose.    Pneumococcal polysaccharide (PPSV23) vaccine. When PCV13 is also indicated, PCV13 should be obtained first. All adults aged 27 years and older should be immunized. An adult younger than age 9 years who  has certain medical conditions should be immunized. Any person who resides in a nursing home or long-term care facility should be immunized. An adult smoker should be immunized. People with an immunocompromised condition and certain other conditions should receive both PCV13 and PPSV23 vaccines. People with human immunodeficiency virus (HIV) infection should  be immunized as soon as possible after diagnosis. Immunization during chemotherapy or radiation therapy should be avoided. Routine use of PPSV23 vaccine is not recommended for American Indians, [place] Natives, or people younger than 65 years unless there are medical conditions that require PPSV23 vaccine. When indicated, people who have unknown immunization and have no record of immunization should receive PPSV23 vaccine. One-time revaccination 5 years after the first dose of PPSV23 is recommended for people aged 19-64 years who have chronic kidney failure, nephrotic syndrome, asplenia, or immunocompromised conditions. People who received 1-2 doses of PPSV23 before age 43 years should receive another dose of PPSV23 vaccine at age 68 years or later if at least 5 years have passed since the previous dose. Doses of PPSV23 are not needed for people immunized with PPSV23 at or after age 75 years.   Preventive Services / Frequency  Ages 69 years and over  Blood pressure check.  Lipid and cholesterol check.  Lung cancer screening. / Every year if you are aged 51-80 years and have a 30-pack-year history of smoking and currently smoke or have quit within the past 15 years. Yearly screening is stopped once you have quit smoking for at least 15 years or develop a health problem that would prevent you from having lung cancer treatment.  Clinical breast exam.** / Every year after age 67 years.  BRCA-related cancer risk assessment.** / For women who have family members with a BRCA-related cancer (breast, ovarian, tubal, or peritoneal cancers).  Mammogram.**  / Every year beginning at age 58 years and continuing for as long as you are in good health. Consult with your health care provider.  Pap test.** / Every 3 years starting at age 41 years through age 17 or 32 years with 3 consecutive normal Pap tests. Testing can be stopped between 65 and 70 years with 3 consecutive normal Pap tests and no abnormal Pap or HPV tests in the past 10 years.  Fecal occult blood test (FOBT) of stool. / Every year beginning at age 55 years and continuing until age 49 years. You may not need to do this test if you get a colonoscopy every 10 years.  Flexible sigmoidoscopy or colonoscopy.** / Every 5 years for a flexible sigmoidoscopy or every 10 years for a colonoscopy beginning at age 78 years and continuing until age 36 years.  Hepatitis C blood test.** / For all people born from [birth year] through 1963 and any individual with known risks for hepatitis C.  Osteoporosis screening.** / A one-time screening for women ages 87 years and over and women at risk for fractures or osteoporosis.  Skin self-exam. / Monthly.  Influenza vaccine. / Every year.  Tetanus, diphtheria, and acellular pertussis (Tdap/Td) vaccine.** / 1 dose of Td every 10 years.  Zoster vaccine.** / 1 dose for adults aged 52 years or older.  Pneumococcal 13-valent conjugate (PCV13) vaccine.** / Consult your health care provider.  Pneumococcal polysaccharide (PPSV23) vaccine.** / 1 dose for all adults aged 9 years and older. Screening for abdominal aortic aneurysm (AAA)  by ultrasound is recommended for people who have history of high blood pressure or who are current or former smokers.

## 2014-11-17 ENCOUNTER — Encounter: Payer: Self-pay | Admitting: Internal Medicine

## 2014-11-30 ENCOUNTER — Other Ambulatory Visit: Payer: Self-pay

## 2014-11-30 MED ORDER — VERAPAMIL HCL ER 240 MG PO TBCR: 240.0000 mg | EXTENDED_RELEASE_TABLET | Freq: Every day | ORAL | Status: DC

## 2015-01-27 ENCOUNTER — Other Ambulatory Visit: Payer: Self-pay | Admitting: Physician Assistant

## 2015-03-06 ENCOUNTER — Other Ambulatory Visit: Payer: Self-pay | Admitting: Internal Medicine

## 2015-03-06 DIAGNOSIS — J209 Acute bronchitis, unspecified: Secondary | ICD-10-CM

## 2015-03-06 MED ORDER — AMOXICILLIN 250 MG PO CAPS: ORAL_CAPSULE | ORAL | Status: DC

## 2015-03-22 ENCOUNTER — Encounter: Payer: Self-pay | Admitting: Internal Medicine

## 2015-03-22 ENCOUNTER — Ambulatory Visit (INDEPENDENT_AMBULATORY_CARE_PROVIDER_SITE_OTHER): Payer: Medicare PPO | Admitting: Internal Medicine

## 2015-03-22 VITALS — BP 124/76 | HR 60 | Temp 97.6°F | Resp 16 | Ht 63.0 in | Wt 171.6 lb

## 2015-03-22 DIAGNOSIS — N183 Chronic kidney disease, stage 3 unspecified: Secondary | ICD-10-CM

## 2015-03-22 DIAGNOSIS — E559 Vitamin D deficiency, unspecified: Secondary | ICD-10-CM | POA: Diagnosis not present

## 2015-03-22 DIAGNOSIS — Z6830 Body mass index (BMI) 30.0-30.9, adult: Secondary | ICD-10-CM | POA: Diagnosis not present

## 2015-03-22 DIAGNOSIS — E039 Hypothyroidism, unspecified: Secondary | ICD-10-CM

## 2015-03-22 DIAGNOSIS — E782 Mixed hyperlipidemia: Secondary | ICD-10-CM | POA: Diagnosis not present

## 2015-03-22 DIAGNOSIS — Z1389 Encounter for screening for other disorder: Secondary | ICD-10-CM

## 2015-03-22 DIAGNOSIS — Z789 Other specified health status: Secondary | ICD-10-CM

## 2015-03-22 DIAGNOSIS — R7303 Prediabetes: Secondary | ICD-10-CM | POA: Diagnosis not present

## 2015-03-22 DIAGNOSIS — Z1331 Encounter for screening for depression: Secondary | ICD-10-CM

## 2015-03-22 DIAGNOSIS — Z1212 Encounter for screening for malignant neoplasm of rectum: Secondary | ICD-10-CM

## 2015-03-22 DIAGNOSIS — Z23 Encounter for immunization: Secondary | ICD-10-CM | POA: Diagnosis not present

## 2015-03-22 DIAGNOSIS — I10 Essential (primary) hypertension: Secondary | ICD-10-CM | POA: Diagnosis not present

## 2015-03-22 DIAGNOSIS — E663 Overweight: Secondary | ICD-10-CM | POA: Insufficient documentation

## 2015-03-22 DIAGNOSIS — Z9181 History of falling: Secondary | ICD-10-CM

## 2015-03-22 DIAGNOSIS — Z79899 Other long term (current) drug therapy: Secondary | ICD-10-CM

## 2015-03-22 LAB — CBC WITH DIFFERENTIAL/PLATELET
Basophils Absolute: 0.1 10*3/uL (ref 0.0–0.1)
Basophils Relative: 1 % (ref 0–1)
Eosinophils Absolute: 0.3 10*3/uL (ref 0.0–0.7)
Eosinophils Relative: 5 % (ref 0–5)
HCT: 33 % — ABNORMAL LOW (ref 36.0–46.0)
Hemoglobin: 10.9 g/dL — ABNORMAL LOW (ref 12.0–15.0)
Lymphocytes Relative: 24 % (ref 12–46)
Lymphs Abs: 1.5 10*3/uL (ref 0.7–4.0)
MCH: 28.1 pg (ref 26.0–34.0)
MCHC: 33 g/dL (ref 30.0–36.0)
MCV: 85.1 fL (ref 78.0–100.0)
MPV: 10.8 fL (ref 8.6–12.4)
Monocytes Absolute: 0.5 10*3/uL (ref 0.1–1.0)
Monocytes Relative: 8 % (ref 3–12)
Neutro Abs: 3.9 10*3/uL (ref 1.7–7.7)
Neutrophils Relative %: 62 % (ref 43–77)
Platelets: 246 10*3/uL (ref 150–400)
RBC: 3.88 MIL/uL (ref 3.87–5.11)
RDW: 14.2 % (ref 11.5–15.5)
WBC: 6.3 10*3/uL (ref 4.0–10.5)

## 2015-03-22 LAB — BASIC METABOLIC PANEL WITHOUT GFR
Calcium: 8.5 mg/dL — ABNORMAL LOW (ref 8.6–10.4)
Creat: 0.94 mg/dL — ABNORMAL HIGH (ref 0.60–0.88)
GFR, Est African American: 66 mL/min (ref >=60)
GFR, Est Non African American: 57 mL/min — ABNORMAL LOW (ref >=60)
Sodium: 138 mmol/L (ref 135–146)

## 2015-03-22 LAB — HEPATIC FUNCTION PANEL
ALT: 16 U/L (ref 6–29)
AST: 24 U/L (ref 10–35)
Albumin: 3.7 g/dL (ref 3.6–5.1)
Alkaline Phosphatase: 77 U/L (ref 33–130)
Bilirubin, Direct: 0.1 mg/dL (ref <=0.2)
Indirect Bilirubin: 0.3 mg/dL (ref 0.2–1.2)
Total Bilirubin: 0.4 mg/dL (ref 0.2–1.2)
Total Protein: 6.3 g/dL (ref 6.1–8.1)

## 2015-03-22 LAB — LIPID PANEL
Cholesterol: 129 mg/dL (ref 125–200)
HDL: 35 mg/dL — ABNORMAL LOW (ref >=46)
LDL Cholesterol: 72 mg/dL (ref <130)
Total CHOL/HDL Ratio: 3.7 ratio (ref <=5.0)
Triglycerides: 110 mg/dL (ref <150)
VLDL: 22 mg/dL (ref <30)

## 2015-03-22 LAB — TSH: TSH: 0.515 u[IU]/mL (ref 0.350–4.500)

## 2015-03-22 LAB — MAGNESIUM: Magnesium: 1.8 mg/dL (ref 1.5–2.5)

## 2015-03-22 LAB — BASIC METABOLIC PANEL WITH GFR
BUN: 16 mg/dL (ref 7–25)
CO2: 25 mmol/L (ref 20–31)
Chloride: 105 mmol/L (ref 98–110)
Glucose, Bld: 75 mg/dL (ref 65–99)
Potassium: 3.7 mmol/L (ref 3.5–5.3)

## 2015-03-22 NOTE — Patient Instructions (Signed)

## 2015-03-22 NOTE — Progress Notes (Signed)
Patient ID: Mikayla Harrison, female   DOB: Oct 31, 1931, 81 y.o.   MRN: [id number]   Comprehensive Evaluation, Examination and Management  This very nice 81 y.o. [facility] presents for presents for a comprehensive evaluation, examination and management of multiple medical co-morbidities.  Patient has been followed for HTN, Prediabetes, Hyperlipidemia, Hypothyroidism and Vitamin D Deficiency.    HTN predates since 72. Patient's BP has been controlled at home and patient denies any cardiac symptoms as chest pain, palpitations, shortness of breath, dizziness or ankle swelling. Today's BP: 124/76 mmHg    Patient's hyperlipidemia is controlled with diet and medications. Patient denies myalgias or other medication SE's. Last lipids were at goal with  Cholesterol 141; HDL 37*; LDL 82; Triglycerides 110 on 10/05/2014.   Patient has prediabetes predating since 2007 with A1c 5.7% and in 2014 her A1c was 6.2% and patient denies reactive hypoglycemic symptoms, visual blurring, diabetic polys, or paresthesias. Last A1c was  6.1% on 10/05/2014.    Patient has been on thyroid replacement 1993. Finally, patient has history of Vitamin D Deficiency of 36 in 2009  and last Vitamin D was 10/05/2014: Vit D, 25-Hydroxy 29 on      Medication Sig  . aspirin 81 MG tablet Take 81 mg by mouth daily.    . benazepril  40 MG tablet TAKE 1 TABLET EVERY DAY  . levothyroxine  150 MCG tablet Take 1 tablet (150 mcg total) by mouth daily.  . verapamil -SR) 240 MG CR tablet Take 1 tablet (240 mg total) by mouth daily.  Marland Kitchen atenolol  50 MG tablet Take 1 tablet (50 mg total) by mouth daily.   Allergies  Allergen Reactions  . Asa [Aspirin]   . Codeine Nausea Only  . Prednisone Nausea And Vomiting  . Sulfa Antibiotics    Past Medical History  Diagnosis Date  . Hypertension   . Hyperlipidemia   . Hypothyroidism   . Anemia   . Iron deficiency   . Small bowel obstruction ([place])   . Colon polyp   . Diverticulosis   . Prediabetes    Health  Maintenance  Topic Date Due  . TETANUS/TDAP  02/25/1953  . ZOSTAVAX  02/25/1994  . DEXA SCAN  02/26/1999  . INFLUENZA VACCINE  01/10/2015  . PNA vac Low Risk Adult (2 of 2 - PCV13) 03/17/2015   Immunization History  Administered Date(s) Administered  . DT 03/22/2015  . Influenza Split 03/04/2013  . Influenza, High Dose Seasonal PF 03/16/2014, 03/22/2015  . Pneumococcal Polysaccharide-23 03/16/2014   Past Surgical History  Procedure Laterality Date  . Cataract extraction    . Vericose vein  1960  . Macular tear     Family History  Problem Relation Age of Onset  . Heart disease Mother   . Heart disease Father   . Colon polyps Sister   . Diabetes Sister   . Heart disease Brother    Social History  Substance Use Topics  . Smoking status: Former Smoker -- 1.00 packs/day for 15 years    Quit date: 06/11/1965  . Smokeless tobacco: Never Used  . Alcohol Use: No    ROS Constitutional: Denies fever, chills, weight loss/gain, headaches, insomnia,  night sweats, and change in appetite. Does c/o fatigue. Eyes: Denies redness, blurred vision, diplopia, discharge, itchy, watery eyes.  ENT: Denies discharge, congestion, post nasal drip, epistaxis, sore throat, earache, hearing loss, dental pain, Tinnitus, Vertigo, Sinus pain, snoring.  Cardio: Denies chest pain, palpitations, irregular heartbeat, syncope, dyspnea, diaphoresis, orthopnea,  PND, claudication, edema Respiratory: denies cough, dyspnea, DOE, pleurisy, hoarseness, laryngitis, wheezing.  Gastrointestinal: Denies dysphagia, heartburn, reflux, water brash, pain, cramps, nausea, vomiting, bloating, diarrhea, constipation, hematemesis, melena, hematochezia, jaundice, hemorrhoids Genitourinary: Denies dysuria, frequency, urgency, nocturia, hesitancy, discharge, hematuria, flank pain Breast: Breast lumps, nipple discharge, bleeding.  Musculoskeletal: Denies arthralgia, myalgia, stiffness, Jt. Swelling, pain, limp, and strain/sprain.  Denies falls. Skin: Denies puritis, rash, hives, warts, acne, eczema, changing in skin lesion Neuro: No weakness, tremor, incoordination, spasms, paresthesia, pain Psychiatric: Denies confusion, memory loss, sensory loss. Denies Depression. Endocrine: Denies change in weight, skin, hair change, nocturia, and paresthesia, diabetic polys, visual blurring, hyper / hypo glycemic episodes.  Heme/Lymph: No excessive bleeding, bruising, enlarged lymph nodes.  Physical Exam  BP 124/76 mmHg  Pulse 60  Temp(Src) 97.6 F (36.4 C)  Resp 16  Ht 5\' 3"  (1.6 m)  Wt 171 lb 9.6 oz (77.837 kg)  BMI 30.41 kg/m2  General Appearance: Well nourished and in no apparent distress. Eyes: PERRLA, EOMs, conjunctiva no swelling or erythema, normal fundi and vessels. Sinuses: No frontal/maxillary tenderness ENT/Mouth: EACs patent / TMs  nl. Nares clear without erythema, swelling, mucoid exudates. Oral hygiene is good. No erythema, swelling, or exudate. Tongue normal, non-obstructing. Tonsils not swollen or erythematous. Hearing normal.  Neck: Supple, thyroid normal. No bruits, nodes or JVD. Respiratory: Respiratory effort normal.  BS equal and clear bilateral without rales, rhonci, wheezing or stridor. Cardio: Heart sounds are normal with regular rate and rhythm and no murmurs, rubs or gallops. Peripheral pulses are normal and equal bilaterally without edema. No aortic or femoral bruits. Chest: symmetric with normal excursions and percussion. Breasts: Symmetric, without lumps, nipple discharge, retractions, or fibrocystic changes.  Abdomen: Flat, soft, with bowel sounds. Nontender, no guarding, rebound, hernias, masses, or organomegaly.  Lymphatics: Non tender without lymphadenopathy.  Musculoskeletal: Full ROM all peripheral extremities, joint stability, 5/5 strength, and normal gait. Skin: Warm and dry without rashes, lesions, cyanosis, clubbing or  ecchymosis.  Neuro: Cranial nerves intact, reflexes equal  bilaterally. Normal muscle tone, no cerebellar symptoms. Sensation intact.  Pysch: Alert and oriented X 3, normal affect, Insight and Judgment appropriate.   Assessment and Plan  1. Essential hypertension  - Microalbumin / creatinine urine ratio - EKG 12-Lead - [place], RETROPERITNL ABD,  LTD - TSH  2. Hyperlipidemia  - Lipid panel  3. Prediabetes  - Hemoglobin A1c - Insulin, random  4. Vitamin D deficiency  - Vit D  25 hydroxy   5. Hypothyroidism  - TSH  6. CKD Stage III (GFR 56 ml/min)   7. Screening for rectal cancer  - POC Hemoccult Bld/Stl   8. Depression screen   9. At low risk for fall   10. Medication management  - CBC with Differential/Platelet - BASIC METABOLIC PANEL WITH GFR - Hepatic function panel - Magnesium  11. Need for prophylactic vaccination with tetanus-diphtheria (TD)  - DT Vaccine greater than 7yo IM  12. Need for prophylactic vaccination and inoculation against influenza  - Flu vaccine HIGH DOSE PF (Fluzone High dose)  13. BMI 30.41, adult   14. Morbid obesity, unspecified obesity type ([place])   Continue prudent diet as discussed, weight control, BP monitoring, regular exercise, and medications. Discussed med's effects and SE's. Screening labs and tests as requested with regular follow-up as recommended.  Over 40 minutes of exam, counseling, chart review was performed.

## 2015-03-23 LAB — HEMOGLOBIN A1C
Hgb A1c MFr Bld: 6.1 % — ABNORMAL HIGH (ref <5.7)
Mean Plasma Glucose: 128 mg/dL — ABNORMAL HIGH (ref <117)

## 2015-03-23 LAB — MICROALBUMIN / CREATININE URINE RATIO
Creatinine, Urine: 39.2 mg/dL
Microalb Creat Ratio: 5.1 mg/g (ref 0.0–30.0)
Microalb, Ur: 0.2 mg/dL (ref <2.0)

## 2015-03-23 LAB — INSULIN, RANDOM: Insulin: 19.9 u[IU]/mL — ABNORMAL HIGH (ref 2.0–19.6)

## 2015-03-23 LAB — VITAMIN D 25 HYDROXY (VIT D DEFICIENCY, FRACTURES): Vit D, 25-Hydroxy: 30 ng/mL (ref 30–100)

## 2015-04-12 ENCOUNTER — Ambulatory Visit: Payer: Self-pay | Admitting: Internal Medicine

## 2015-04-13 ENCOUNTER — Ambulatory Visit (INDEPENDENT_AMBULATORY_CARE_PROVIDER_SITE_OTHER): Payer: Medicare PPO | Admitting: Internal Medicine

## 2015-04-13 ENCOUNTER — Encounter: Payer: Self-pay | Admitting: Internal Medicine

## 2015-04-13 VITALS — BP 108/60 | HR 72 | Temp 97.5°F | Resp 16 | Ht 63.0 in | Wt 171.0 lb

## 2015-04-13 DIAGNOSIS — J0100 Acute maxillary sinusitis, unspecified: Secondary | ICD-10-CM

## 2015-04-13 DIAGNOSIS — J40 Bronchitis, not specified as acute or chronic: Secondary | ICD-10-CM | POA: Diagnosis not present

## 2015-04-16 ENCOUNTER — Encounter: Payer: Self-pay | Admitting: Internal Medicine

## 2015-04-16 MED ORDER — PREDNISONE 20 MG PO TABS: ORAL_TABLET | ORAL | Status: DC

## 2015-04-16 MED ORDER — HYDROCODONE-ACETAMINOPHEN 5-325 MG PO TABS: ORAL_TABLET | ORAL | Status: DC

## 2015-04-16 MED ORDER — AZITHROMYCIN 250 MG PO TABS: ORAL_TABLET | ORAL | Status: DC

## 2015-04-16 NOTE — Progress Notes (Signed)
  Subjective:    Patient ID: Mikayla Harrison, female    DOB: 1931/10/06, 81 y.o.   MRN: [id number]  HPI  This very nice 81 yo WWF presented with 1-2 week hx/o head & chest congestion , productive cough with yellow sputum, no fver, chills, sweats, dyspnea or CP.   Medication Sig  . aspirin 81 MG tablet Take 81 mg by mouth daily.    . benazepril (LOTENSIN) 40 MG tablet TAKE 1 TABLET EVERY DAY  . levothyroxine (SYNTHROID, LEVOTHROID) 150 MCG tablet Take 1 tablet (150 mcg total) by mouth daily.  . verapamil (CALAN-SR) 240 MG CR tablet Take 1 tablet (240 mg total) by mouth daily.   Allergies  Allergen Reactions  . Asa [Aspirin]   . Codeine Nausea Only  . Prednisone Nausea And Vomiting  . Sulfa Antibiotics    Past Medical History  Diagnosis Date  . Hypertension   . Hyperlipidemia   . Hypothyroidism   . Anemia   . Iron deficiency   . Small bowel obstruction ([place])   . Colon polyp   . Diverticulosis   . Prediabetes    Past Surgical History  Procedure Laterality Date  . Cataract extraction    . Vericose vein  1960  . Macular tear     Review of Systems 10 point systems review negative except as above.    Objective:   Physical Exam  BP 108/60 mmHg  Pulse 72  Temp(Src) 97.5 F (36.4 C)  Resp 16  Ht 5\' 3"  (1.6 m)  Wt 171 lb (77.565 kg)  BMI 30.30 kg/m2  Brassy congested cough. No respiratory distress. No rash, cyanosis, icterus or clubbing.  HEENT - Eac's patent. TM's Nl. EOM's full. (+) tender frontal/maxillary sinus areas.  PERRLA. NasoOroPharynx clear. Neck - supple. Nl Thyroid. Carotids 2+ & No bruits, nodes, JVD Chest - Clear equal BS w/ scattered  Rales & rhonchi, no wheezes. Cor - Nl HS. RRR w/o sig MGR. PP 1(+). No edema. Abd - No palpable organomegaly, masses or tenderness. BS nl. MS- FROM w/o deformities. Muscle power, tone and bulk Nl. Gait Nl. Neuro - No obvious Cr N abnormalities. Sensory, motor and Cerebellar functions appear Nl w/o focal abnormalities. Psyche -  Mental status normal & appropriate.  No delusions, ideations or obvious mood abnormalities.    Assessment & Plan:   1. Bronchitis,  acute   2. Acute maxillary sinusitis  - predniSONE (DELTASONE) 20 MG tablet; 1 tab 3 x day for 3 days, then 1 tab 2 x day for 3 days, then 1 tab 1 x day for 5 days  Dispense: 20 tablet; Refill: 0 - HYDROcodone-acetaminophen (NORCO) 5-325 MG tablet; A1/2 to 1 tablet every 3 to 4 hours if needed for cough or pain  Dispense: 30 tablet; Refill: 0 - azithromycin (ZITHROMAX) 250 MG tablet; Take 2 tablets (500 mg) on  Day 1,  followed by 1 tablet (250 mg) once daily on Days 2 through 5.  Dispense: 6 each; Refill: 1

## 2015-04-17 ENCOUNTER — Other Ambulatory Visit: Payer: Self-pay | Admitting: Internal Medicine

## 2015-04-29 ENCOUNTER — Other Ambulatory Visit: Payer: Self-pay | Admitting: Internal Medicine

## 2015-05-02 ENCOUNTER — Other Ambulatory Visit: Payer: Self-pay | Admitting: *Deleted

## 2015-05-02 MED ORDER — BENAZEPRIL HCL 40 MG PO TABS: 40.0000 mg | ORAL_TABLET | Freq: Every day | ORAL | Status: DC

## 2015-06-23 ENCOUNTER — Ambulatory Visit: Payer: Self-pay | Admitting: Internal Medicine

## 2015-07-20 ENCOUNTER — Other Ambulatory Visit: Payer: Self-pay | Admitting: Internal Medicine

## 2015-07-28 ENCOUNTER — Encounter: Payer: Self-pay | Admitting: Physician Assistant

## 2015-07-28 ENCOUNTER — Ambulatory Visit: Payer: Self-pay | Admitting: Physician Assistant

## 2015-07-28 ENCOUNTER — Ambulatory Visit (INDEPENDENT_AMBULATORY_CARE_PROVIDER_SITE_OTHER): Payer: Medicare PPO | Admitting: Physician Assistant

## 2015-07-28 ENCOUNTER — Ambulatory Visit (HOSPITAL_COMMUNITY)
Admission: RE | Admit: 2015-07-28 | Discharge: 2015-07-28 | Disposition: A | Discharge status: Home / Self Care | Payer: Medicare PPO | Source: Ambulatory Visit | Attending: Physician Assistant | Admitting: Physician Assistant

## 2015-07-28 VITALS — BP 130/80 | Temp 97.5°F | Resp 16 | Ht 63.0 in | Wt 172.0 lb

## 2015-07-28 DIAGNOSIS — R0600 Dyspnea, unspecified: Secondary | ICD-10-CM | POA: Insufficient documentation

## 2015-07-28 DIAGNOSIS — M7989 Other specified soft tissue disorders: Secondary | ICD-10-CM | POA: Diagnosis not present

## 2015-07-28 DIAGNOSIS — I7300 Raynaud's syndrome without gangrene: Secondary | ICD-10-CM

## 2015-07-28 DIAGNOSIS — R05 Cough: Secondary | ICD-10-CM | POA: Diagnosis not present

## 2015-07-28 DIAGNOSIS — R002 Palpitations: Secondary | ICD-10-CM

## 2015-07-28 DIAGNOSIS — M79602 Pain in left arm: Secondary | ICD-10-CM

## 2015-07-28 LAB — CBC WITH DIFFERENTIAL/PLATELET
Basophils Absolute: 0.1 10*3/uL (ref 0.0–0.1)
Basophils Relative: 1 % (ref 0–1)
Eosinophils Absolute: 0.2 10*3/uL (ref 0.0–0.7)
Eosinophils Relative: 3 % (ref 0–5)
HCT: 35.9 % — ABNORMAL LOW (ref 36.0–46.0)
Hemoglobin: 12.1 g/dL (ref 12.0–15.0)
Lymphocytes Relative: 22 % (ref 12–46)
Lymphs Abs: 1.5 10*3/uL (ref 0.7–4.0)
MCH: 28.7 pg (ref 26.0–34.0)
MCHC: 33.7 g/dL (ref 30.0–36.0)
MCV: 85.1 fL (ref 78.0–100.0)
MPV: 10.4 fL (ref 8.6–12.4)
Monocytes Absolute: 0.5 10*3/uL (ref 0.1–1.0)
Monocytes Relative: 8 % (ref 3–12)
Neutro Abs: 4.5 10*3/uL (ref 1.7–7.7)
Neutrophils Relative %: 66 % (ref 43–77)
Platelets: 226 10*3/uL (ref 150–400)
RBC: 4.22 MIL/uL (ref 3.87–5.11)
RDW: 14.2 % (ref 11.5–15.5)
WBC: 6.8 10*3/uL (ref 4.0–10.5)

## 2015-07-28 LAB — BASIC METABOLIC PANEL WITHOUT GFR
BUN: 14 mg/dL (ref 7–25)
CO2: 26 mmol/L (ref 20–31)
Chloride: 106 mmol/L (ref 98–110)
Creat: 0.99 mg/dL — ABNORMAL HIGH (ref 0.60–0.88)

## 2015-07-28 LAB — HEPATIC FUNCTION PANEL
ALT: 12 U/L (ref 6–29)
AST: 19 U/L (ref 10–35)
Albumin: 3.9 g/dL (ref 3.6–5.1)
Alkaline Phosphatase: 85 U/L (ref 33–130)
Bilirubin, Direct: 0.1 mg/dL (ref <=0.2)
Indirect Bilirubin: 0.3 mg/dL (ref 0.2–1.2)
Total Bilirubin: 0.4 mg/dL (ref 0.2–1.2)
Total Protein: 6.7 g/dL (ref 6.1–8.1)

## 2015-07-28 LAB — BASIC METABOLIC PANEL WITH GFR
Calcium: 8.9 mg/dL (ref 8.6–10.4)
GFR, Est African American: 62 mL/min (ref >=60)
GFR, Est Non African American: 54 mL/min — ABNORMAL LOW (ref >=60)
Glucose, Bld: 77 mg/dL (ref 65–99)
Potassium: 4 mmol/L (ref 3.5–5.3)
Sodium: 141 mmol/L (ref 135–146)

## 2015-07-28 LAB — D-DIMER, QUANTITATIVE: D-Dimer, Quant: 0.81 ug{FEU}/mL — ABNORMAL HIGH (ref 0.00–0.48)

## 2015-07-28 LAB — SEDIMENTATION RATE: Sed Rate: 17 mm/hr (ref 0–30)

## 2015-07-28 LAB — CK: Total CK: 111 U/L (ref 7–177)

## 2015-07-28 NOTE — Progress Notes (Addendum)
Subjective:    Patient ID: Mikayla Harrison, female    DOB: 08-07-1931, 82 y.o.   MRN: [id number]  HPI 82 y.o. WF with history of HTN, PVCs, CKD stage 3, has history of smoking x 15 pack year quit 1967, chol presents with cold hands. She states her bilateral fingers have been turning blue/white off and on for several months, nothing causes it, better with friction/moving them. She has bilateral arm pain but admits that she has "pain all over", worse a few nights ago, worse left arm, some GERD and some nausea at the time, no dizziness, SOB, diaphoresis. nothing worse/nothing better, took a 1/4 of xanax and bengay on left elbow that helped, lasted for a few hours. She has been more short of breath with stairs last few months without accompaniments. Has palpitations at night only, nonexertional. She denies edema, weight gain, PND, orthopnea.    Wt Readings from Last 3 Encounters:  07/28/15 172 lb (78.019 kg)  04/13/15 171 lb (77.565 kg)  03/22/15 171 lb 9.6 oz (77.837 kg)    Blood pressure 130/80, temperature 97.5 F (36.4 C), temperature source Temporal, resp. rate 16, height _0  (1.6 m), weight 172 lb (78.019 kg).  Past Medical History  Diagnosis Date  . Hypertension   . Hyperlipidemia   . Hypothyroidism   . Anemia   . Iron deficiency   . Small bowel obstruction ([place])   . Colon polyp   . Diverticulosis   . Prediabetes    Current Outpatient Prescriptions on File Prior to Visit  Medication Sig Dispense Refill  . aspirin 81 MG tablet Take 81 mg by mouth daily.      . benazepril (LOTENSIN) 40 MG tablet Take 1 tablet (40 mg total) by mouth daily. 30 tablet 0  . levothyroxine (SYNTHROID, LEVOTHROID) 150 MCG tablet TAKE 1 TABLET DAILY 90 tablet 0   No current facility-administered medications on file prior to visit.   Social History  Substance Use Topics  . Smoking status: Former Smoker -- 1.00 packs/day for 15 years    Quit date: 06/11/1965  . Smokeless tobacco: Never Used  . Alcohol  Use: No   Family History  Problem Relation Age of Onset  . Heart disease Mother   . Heart disease Father   . Colon polyps Sister   . Diabetes Sister   . Heart disease Brother     Review of Systems  Constitutional: Positive for fatigue. Negative for fever, chills, diaphoresis, activity change, appetite change and unexpected weight change.  HENT: Negative.   Eyes: Negative.   Respiratory: Positive for cough (rare) and shortness of breath. Negative for apnea, choking, chest tightness, wheezing and stridor.   Cardiovascular: Positive for palpitations and leg swelling (left more than right). Negative for chest pain.  Gastrointestinal: Positive for nausea (x 1 with arm pain). Negative for vomiting, abdominal pain, diarrhea and constipation.       + GERD  Genitourinary: Negative.   Musculoskeletal: Positive for myalgias (bilateral arm pain, left greater than right, no jaw pain) and arthralgias. Negative for neck pain and neck stiffness.  Skin: Positive for color change (fingers, better with friction). Negative for rash.  Neurological: Negative.  Negative for dizziness.  Hematological: Negative.   Psychiatric/Behavioral: The patient is nervous/anxious.        Objective:   Physical Exam  Constitutional: She is oriented to person, place, and time. She appears well-developed and well-nourished. No distress.  HENT:  Head: Normocephalic and atraumatic.  Eyes: Conjunctivae  are normal. Pupils are equal, round, and reactive to light.  Neck: Normal range of motion. Neck supple. No JVD present.  Cardiovascular: Normal rate, regular rhythm and intact distal pulses.   Occasional extrasystoles are present. PMI is not displaced.  Exam reveals no gallop and no friction rub.   Murmur: mild systolic click/murmur. Pulmonary/Chest: Breath sounds normal. No accessory muscle usage. No respiratory distress.  Abdominal: Soft. Normal appearance and bowel sounds are normal. She exhibits no abdominal bruit and  no pulsatile midline mass. There is no tenderness.  Musculoskeletal: Edema: left leg with 2 + edema compared to right, negative homen's sign.  + general chest tenderness  Lymphadenopathy:    She has no cervical adenopathy.  Neurological: She is alert and oriented to person, place, and time. She has normal strength and normal reflexes. No cranial nerve deficit or sensory deficit. She exhibits normal muscle tone.  Reflex Scores:      Tricep reflexes are 2+ on the right side and 2+ on the left side.      Bicep reflexes are 2+ on the right side and 2+ on the left side.      Brachioradialis reflexes are 2+ on the right side and 2+ on the left side. Skin: She is not diaphoretic. No cyanosis. Nails show no clubbing.  Bilateral hands with erythema at fingers, good cap refill      Assessment & Plan:  Raynaud's Stay warm, get ESR, normal pulses, continue ASA, declines CCB/meds  SOB with exertion, remote history of arm pain Get EKG shows NSR no ST changes, get CXR, continue ASA ? From ACE, declines switch of medication at this time.  Low risk DVT, will get Ddimer with palpitations/SOB Possible FM, may start medication after negative work up History of smoking, family history of CAD mother, father, brother, HTN, obesity, and new dyspnea with exertion- will refer to cardio for stress test/echo.    If any worsening SOB, any CP/jaw pain/worsening arm pain, dizziness go to ER

## 2015-07-28 NOTE — Patient Instructions (Signed)
Raynaud Phenomenon Raynaud phenomenon is a condition that affects the blood vessels (arteries) that carry blood to your fingers and toes. The arteries that supply blood to your ears or the tip of your nose might also be affected. Raynaud phenomenon causes the arteries to temporarily narrow. As a result, the flow of blood to the affected areas is temporarily decreased. This usually occurs in response to cold temperatures or stress. During an attack, the skin in the affected areas turns white. You may also feel tingling or numbness in those areas. Attacks usually last for only a brief period, and then the blood flow to the area returns to normal. In most cases, Raynaud phenomenon does not cause serious health problems. CAUSES  For many people with this condition, the cause is not known. Raynaud phenomenon is sometimes associated with other diseases, such as scleroderma or lupus. RISK FACTORS Raynaud phenomenon can affect anyone, but it develops most often in people who are 42-59 years old. It affects more females than males. SIGNS AND SYMPTOMS Symptoms of Raynaud phenomenon may occur when you are exposed to cold temperatures or when you have emotional stress. The symptoms may last for a few minutes or up to several hours. They usually affect your fingers but may also affect your toes, ears, or the tip of your nose. Symptoms may include:  Changes in skin color. The skin in the affected areas will turn pale or white. The skin may then change from white to bluish to red as normal blood flow returns to the area.  Numbness, tingling, or pain in the affected areas. In severe cases, sores may develop in the affected areas.  DIAGNOSIS  Your health care provider will do a physical exam and take your medical history. You may be asked to put your hands in cold water to check for a reaction to cold temperature. Blood tests may be done to check for other diseases or conditions. Your health care provider may also  order a test to check the movement of blood through your arteries and veins (vascular ultrasound). TREATMENT  Treatment often involves making lifestyle changes and taking steps to control your exposure to cold temperatures. For more severe cases, medicine (calcium channel blockers) may be used to improve blood flow. Surgery is sometimes done to block the nerves that control the affected arteries, but this is rare. HOME CARE INSTRUCTIONS   Avoid exposure to cold by taking these steps:  If possible, stay indoors during cold weather.  When you go outside during cold weather, dress in layers and wear mittens, a hat, a scarf, and warm footwear.  Wear mittens or gloves when handling ice or frozen food.  Use holders for glasses or cans containing cold drinks.  Let warm water run for a while before taking a shower or bath.  Warm up the car before driving in cold weather.  If possible, avoid stressful and emotional situations. Exercise, meditation, and yoga may help you cope with stress. Biofeedback may be useful.  Do not use any tobacco products, including cigarettes, chewing tobacco, or electronic cigarettes. If you need help quitting, ask your health care provider.  Avoid secondhand smoke.  Limit your use of caffeine. Switch to decaffeinated coffee, tea, and soda. Avoid chocolate.  Wear loose fitting socks and comfortable, roomy shoes.  Avoid vibrating tools and machinery.  Take medicines only as directed by your health care provider. SEEK MEDICAL CARE IF:   Your discomfort becomes worse despite lifestyle changes.  You develop sores on  your fingers or toes that do not heal.  Your fingers or toes turn black.  You have breaks in the skin on your fingers or toes.  You have a fever.  You have pain or swelling in your joints.  You have a rash.  Your symptoms occur on only one side of your body.   This information is not intended to replace advice given to you by your health  care provider. Make sure you discuss any questions you have with your health care provider.   Document Released: 05/25/2000 Document Revised: 06/18/2014 Document Reviewed: 12/31/2013 Elsevier Interactive Patient Education 2016 [employer]  Costochondritis Costochondritis, sometimes called Tietze syndrome, is a swelling and irritation (inflammation) of the tissue (cartilage) that connects your ribs with your breastbone (sternum). It causes pain in the chest and rib area. Costochondritis usually goes away on its own over time. It can take up to 6 weeks or longer to get better, especially if you are unable to limit your activities. CAUSES  Some cases of costochondritis have no known cause. Possible causes include:  Injury (trauma).  Exercise or activity such as lifting.  Severe coughing. SIGNS AND SYMPTOMS  Pain and tenderness in the chest and rib area.  Pain that gets worse when coughing or taking deep breaths.  Pain that gets worse with specific movements. DIAGNOSIS  Your health care provider will do a physical exam and ask about your symptoms. Chest X-rays or other tests may be done to rule out other problems. TREATMENT  Costochondritis usually goes away on its own over time. Your health care provider may prescribe medicine to help relieve pain. HOME CARE INSTRUCTIONS   Avoid exhausting physical activity. Try not to strain your ribs during normal activity. This would include any activities using chest, abdominal, and side muscles, especially if heavy weights are used.  Apply ice to the affected area for the first 2 days after the pain begins.  Put ice in a plastic bag.  Place a towel between your skin and the bag.  Leave the ice on for 20 minutes, 2-3 times a day.  Only take over-the-counter or prescription medicines as directed by your health care provider. SEEK MEDICAL CARE IF:  You have redness or swelling at the rib joints. These are signs of infection.  Your pain  does not go away despite rest or medicine. SEEK IMMEDIATE MEDICAL CARE IF:   Your pain increases or you are very uncomfortable.  You have shortness of breath or difficulty breathing.  You cough up blood.  You have worse chest pains, sweating, or vomiting.  You have a fever or persistent symptoms for more than 2-3 days.  You have a fever and your symptoms suddenly get worse. MAKE SURE YOU:   Understand these instructions.  Will watch your condition.  Will get help right away if you are not doing well or get worse.   This information is not intended to replace advice given to you by your health care provider. Make sure you discuss any questions you have with your health care provider.   Document Released: 03/07/2005 Document Revised: 03/18/2013 Document Reviewed: 12/30/2012 Elsevier Interactive Patient Education [employer].

## 2015-07-29 LAB — ANTI-NUCLEAR AB-TITER (ANA TITER): ANA Titer 1: 1:320 {titer} — ABNORMAL HIGH

## 2015-07-29 LAB — ANTI-DNA ANTIBODY, DOUBLE-STRANDED: ds DNA Ab: <1 [IU]/mL

## 2015-07-29 LAB — ANA: Anti Nuclear Antibody(ANA): POSITIVE — AB

## 2015-07-29 NOTE — Addendum Note (Signed)
Addended by: Vladimir Crofts on: 07/29/2015 08:49 AM   Modules accepted: Orders

## 2015-08-01 ENCOUNTER — Ambulatory Visit (HOSPITAL_COMMUNITY): Admission: RE | Admit: 2015-08-01 | Payer: Medicare PPO | Source: Ambulatory Visit

## 2015-08-01 ENCOUNTER — Encounter: Payer: Self-pay | Admitting: Cardiovascular Disease

## 2015-08-01 ENCOUNTER — Ambulatory Visit (INDEPENDENT_AMBULATORY_CARE_PROVIDER_SITE_OTHER): Payer: Medicare PPO | Admitting: Cardiovascular Disease

## 2015-08-01 VITALS — BP 142/90 | HR 67 | Ht 66.0 in | Wt 171.8 lb

## 2015-08-01 DIAGNOSIS — N183 Chronic kidney disease, stage 3 unspecified: Secondary | ICD-10-CM

## 2015-08-01 DIAGNOSIS — R0789 Other chest pain: Secondary | ICD-10-CM | POA: Diagnosis not present

## 2015-08-01 DIAGNOSIS — R0600 Dyspnea, unspecified: Secondary | ICD-10-CM | POA: Diagnosis not present

## 2015-08-01 DIAGNOSIS — I7300 Raynaud's syndrome without gangrene: Secondary | ICD-10-CM

## 2015-08-01 DIAGNOSIS — E782 Mixed hyperlipidemia: Secondary | ICD-10-CM

## 2015-08-01 DIAGNOSIS — E039 Hypothyroidism, unspecified: Secondary | ICD-10-CM | POA: Diagnosis not present

## 2015-08-01 MED ORDER — AMLODIPINE BESYLATE 2.5 MG PO TABS: 2.5000 mg | ORAL_TABLET | Freq: Every day | ORAL | Status: DC

## 2015-08-01 NOTE — Patient Instructions (Addendum)
Your physician has recommended you make the following change in your medication: START AMLODIPINE 2.5 MG DAILY.  HOLD THE DAY OF THE STRESS TEST.   Your physician has requested that you have an echocardiogram. Echocardiography is a painless test that uses sound waves to create images of your heart. It provides your doctor with information about the size and shape of your heart and how well your heart's chambers and valves are working. This procedure takes approximately one hour. There are no restrictions for this procedure.  Your physician has requested that you have a lexiscan myoview. For further information please visit [email or web address]. Please follow instruction sheet, as given.  Your physician recommends that you schedule a follow-up appointment in: after above test with Dr. Claiborne Billings

## 2015-08-01 NOTE — Progress Notes (Signed)
Patient ID: Mikayla Harrison, female   DOB: [birth year], 82 y.o.   MRN: [id number]     Primary MD: Dr. [name]; Vicie Mutters, [facility]  PATIENT PROFILE: Mikayla Harrison is a 82 y.o. female who is referred through the courtesy of Mikayla Harrison for cardiology evaluation following development of shortness of breath and arm discomfort with vague chest pain.   HPI:  Mikayla Harrison denies any known history of CAD.  She has a history of hypertension and has been on but has a pill 40 mg daily for many years.  She also has a history of hypothyroidism and is maintained on levothyroxine 150 g daily.  In the past, she was told of having mild irregularity to her heart rhythm.  She believes she may have been started on verapamil but did not tolerate this.  She recently saw [facility] neck complaints of her fingers turning blue and white particularly in cold weather and he was concerned about the possibility of Raynaud's phenomenon.  He also has noticed arm discomfort intermittently and also had a sensation of vague chest discomfort as well as "pain all over."  She admits to episodes of occasional shortness of breath.  She is frightened about development of either stroke or heart disease.  She is referred for cardiology evaluation.  Past Medical History  Diagnosis Date  . Hypertension   . Hyperlipidemia   . Hypothyroidism   . Anemia   . Iron deficiency   . Small bowel obstruction ([place])   . Colon polyp   . Diverticulosis   . Prediabetes     Past Surgical History  Procedure Laterality Date  . Cataract extraction    . Vericose vein  1960  . Macular tear      Allergies  Allergen Reactions  . Asa [Aspirin]   . Codeine Nausea Only  . Prednisone Nausea And Vomiting  . Sulfa Antibiotics     Current Outpatient Prescriptions  Medication Sig Dispense Refill  . aspirin 81 MG tablet Take 81 mg by mouth daily.      . benazepril (LOTENSIN) 40 MG tablet Take 1 tablet (40 mg total) by mouth daily. 30 tablet 0   . levothyroxine (SYNTHROID, LEVOTHROID) 150 MCG tablet TAKE 1 TABLET DAILY 90 tablet 0  . amLODipine (NORVASC) 2.5 MG tablet Take 1 tablet (2.5 mg total) by mouth daily. 90 tablet 1   No current facility-administered medications for this visit.    Social History   Social History  . Marital Status: Widowed    Spouse Name: N/A  . Number of Children: N/A  . Years of Education: N/A   Occupational History  . Not on file.   Social History Main Topics  . Smoking status: Former Smoker -- 1.00 packs/day for 15 years    Quit date: 06/11/1965  . Smokeless tobacco: Never Used  . Alcohol Use: No  . Drug Use: No  . Sexual Activity: Not on file   Other Topics Concern  . Not on file   Social History Narrative   Socially she is widowed for 17 years.  While her husband was alive.  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  She has 5 children, 7 grandchildren, and 3 great-grandchildren.  Her son lives at home with her.  There is remote tobacco history but she quit smoking in 1967.  Family History  Problem Relation Age of Onset  . Heart disease Mother   .  Heart disease Father   . Colon polyps Sister   . Diabetes Sister   . Heart disease Brother    Family history is notable that her mother died at 33 with stroke.  Father died at 51 and was a boxer who is in the hall, a vein and had arterial sclerosis of his brain.  She had 6 brothers, only one is living.  5 guide.  There was a history of stroke and MI in several brothers.  Her living brother has COPD.  She has 2 living sisters.  ROS General: Negative; No fevers, chills, or night sweats HEENT: Negative; No changes in vision or hearing, sinus congestion, difficulty swallowing Pulmonary: Negative; No cough, wheezing, shortness of breath, hemoptysis Cardiovascular:  See HPI;  GI: Positive for GERD GU: Negative; No dysuria, hematuria, or difficulty voiding Musculoskeletal: She complains of  left knee soreness, occasional swelling. Hematologic/Oncologic: Negative; no easy bruising, bleeding Endocrine: Positive for hypothyroidism Neuro: Negative; no changes in balance, headaches Skin: Negative; No rashes or skin lesions Psychiatric: Negative; No behavioral problems, depression Sleep: Negative; No daytime sleepiness, hypersomnolence, bruxism, restless legs, hypnogagnic hallucinations Other comprehensive 14 point system review is negative   Physical Exam BP 142/90 mmHg  Pulse 67  Ht 5' 6"  (1.676 m)  Wt 171 lb 12.8 oz (77.928 kg)  BMI 27.74 kg/m2   Repeat blood pressure was 150/88.  Wt Readings from Last 3 Encounters:  08/01/15 171 lb 12.8 oz (77.928 kg)  07/28/15 172 lb (78.019 kg)  04/13/15 171 lb (77.565 kg)   General: Alert, oriented, no distress.  Skin: normal turgor, no rashes, warm and dry HEENT: Normocephalic, atraumatic. Pupils equal round and reactive to light; sclera anicteric; extraocular muscles intact; Fundi mild arterial narrowing without hemorrhages or exudates.  Normal discs. Nose without nasal septal hypertrophy Mouth/Parynx benign; Mallinpatti scale 3 Neck: No JVD, no carotid bruits; normal carotid upstroke Lungs: clear to ausculatation and percussion; no wheezing or rales Chest wall: without tenderness to palpitation Heart: PMI not displaced, RRR, s1 s2 normal, 1/6 systolic murmur, no diastolic murmur, no rubs, gallops, thrills, or heaves Abdomen: soft, nontender; no hepatosplenomehaly, BS+; abdominal aorta nontender and not dilated by palpation. Back: no CVA tenderness Pulses 2+ Musculoskeletal: full range of motion, normal strength, no joint deformities Extremities: no clubbing cyanosis or edema, Homan's sign negative  Neurologic: grossly nonfocal; Cranial nerves grossly wnl Psychologic: Normal mood and affect   ECG (independently read by me): Normal sinus rhythm at 67 bpm.  Normal intervals.  No ectopy.  LABS:  BMP Latest Ref Rng 07/28/2015  03/22/2015 11/16/2014  Glucose 65 - 99 mg/dL 77 75 100(H)  BUN 7 - 25 mg/dL 14 16 21   Creatinine 0.60 - 0.88 mg/dL 0.99(H) 0.94(H) 1.02  Sodium 135 - 146 mmol/L 141 138 135  Potassium 3.5 - 5.3 mmol/L 4.0 3.7 4.2  Chloride 98 - 110 mmol/L 106 105 101  CO2 20 - 31 mmol/L 26 25 25   Calcium 8.6 - 10.4 mg/dL 8.9 8.5(L) 9.0     Hepatic Function Latest Ref Rng 07/28/2015 03/22/2015 10/05/2014  Total Protein 6.1 - 8.1 g/dL 6.7 6.3 6.5  Albumin 3.6 - 5.1 g/dL 3.9 3.7 3.5  AST 10 - 35 U/L 19 24 22   ALT 6 - 29 U/L 12 16 13   Alk Phosphatase 33 - 130 U/L 85 77 69  Total Bilirubin 0.2 - 1.2 mg/dL 0.4 0.4 0.4  Bilirubin, Direct <=0.2 mg/dL 0.1 0.1 0.1    CBC Latest Ref Rng 07/28/2015 03/22/2015  11/16/2014  WBC 4.0 - 10.5 K/uL 6.8 6.3 9.6  Hemoglobin 12.0 - 15.0 g/dL 12.1 10.9(L) 11.2(L)  Hematocrit 36.0 - 46.0 % 35.9(L) 33.0(L) 33.9(L)  Platelets 150 - 400 K/uL 226 246 289   Lab Results  Component Value Date   MCV 85.1 07/28/2015   MCV 85.1 03/22/2015   MCV 84.8 11/16/2014   Lab Results  Component Value Date   TSH 0.515 03/22/2015   Lab Results  Component Value Date   HGBA1C 6.1* 03/22/2015     BNP No results found for: BNP  ProBNP No results found for: PROBNP   Lipid Panel     Component Value Date/Time   CHOL 129 03/22/2015 1026   TRIG 110 03/22/2015 1026   HDL 35* 03/22/2015 1026   CHOLHDL 3.7 03/22/2015 1026   VLDL 22 03/22/2015 1026   LDLCALC 72 03/22/2015 1026    RADIOLOGY: Dg Chest 2 View  07/28/2015  CLINICAL DATA:  Nonsmoker, Dyspnea with exertion, cough (possibly from allergies, denies fever or chills, prior CxR 01/24/2010(Canopy) EXAM: CHEST  2 VIEW COMPARISON:  01/24/2010 FINDINGS: The heart size and mediastinal contours are within normal limits. Both lungs are clear. Patient is kyphotic. IMPRESSION: No active cardiopulmonary disease. Electronically Signed   By: Nolon Nations M.D.   On: 07/28/2015 10:10     ASSESSMENT AND PLAN: Ms. Annisten Manchester is an  82 year old female who has a history of hypertension for at least 15 years, as well as hypothyroidism.  Recently, she has noticed some discoloration to her distal phalanges, turning blue and white.  It is possible that she may have a component of Raynaud'sphenomena contributing to transient vasospasm.  She had recently seen Mikayla Harrison and laboratory was drawn 4 days ago.  I reviewed her labs with her in detail.  Of note, she had a positive ANA titer at 1-320, but had a negative anti-DNA titer. ESR 17.  She was mildly anemic with a hemoglobin of 12.1 and hematocrit of 35.9.  Her serum creatinine was 0.99 and GFR was 54, placing her in stage III chronic kidney disease category. Liver studies were normal.  CPK was negative.  She had very mild elevation of the d-dimer and according to [facility] notes she will be undergoing ultrasound of her leg.  The patient had a negative Homans sign on exam today and there was no evidence for any palpable cord.  Her ECG was stable and she was not tachycardic or short of breath.  She was advised to seek further evaluation if she notices any shortness of breath for further testing with possible consideration for V/Q scan.  She has noticed some vague bilateral arm discomfort.  She has noticed some mild shortness of breath with activity , which is not new.  Her blood pressure was mildly increased on exam today.  With her history suggestive of possible Raynaud's phenomenon.  I am electing to add very low-dose amlodipine at 2.5 mg daily.  I am scheduling her for 2-D echo Doppler study to evaluate systolic and diastolic function as well as valvular architecture.  I am also scheduling her for a Lexiscan nuclear stress test.  I will see her back in the office in follow-up of the above studies and further recommendations will be made at that time.   [name] Sine, MD, [facility] 08/01/2015 4:06 PM

## 2015-08-03 ENCOUNTER — Telehealth: Payer: Self-pay | Admitting: Cardiovascular Disease

## 2015-08-03 DIAGNOSIS — R0789 Other chest pain: Secondary | ICD-10-CM

## 2015-08-03 NOTE — Telephone Encounter (Signed)
New Message  Pt requested to speak w/ RN concerning changing her test- scheduled for Lexiscan on 3/7 but did not want to do that but a regular stress test. Please call back and discuss.

## 2015-08-03 NOTE — Telephone Encounter (Signed)
Pt saw Dr. Claiborne Billings on Monday. Was set up to do [place]. States now she would prefer to attempt the treadmill test - should not have a problem w/ activity required. She wanted to make sure we could arrange.  If so, wants to know if instructions for preparation of test will differ.  Routed to Dr. Claiborne Billings for OK/advice.

## 2015-08-07 NOTE — Telephone Encounter (Signed)
Change lexiscan to exercise myoview and instruct pt.

## 2015-08-08 NOTE — Telephone Encounter (Signed)
Exercise myoview ordered.  Called pt, LMTCB.

## 2015-08-11 ENCOUNTER — Telehealth (HOSPITAL_COMMUNITY): Payer: Self-pay

## 2015-08-11 NOTE — Telephone Encounter (Signed)
Encounter complete. 

## 2015-08-12 ENCOUNTER — Telehealth (HOSPITAL_COMMUNITY): Payer: Self-pay

## 2015-08-12 NOTE — Telephone Encounter (Signed)
Encounter complete. 

## 2015-08-16 ENCOUNTER — Other Ambulatory Visit: Payer: Self-pay | Admitting: *Deleted

## 2015-08-16 ENCOUNTER — Inpatient Hospital Stay (HOSPITAL_COMMUNITY): Admission: RE | Admit: 2015-08-16 | Payer: Medicare PPO | Source: Ambulatory Visit

## 2015-08-16 DIAGNOSIS — R0789 Other chest pain: Secondary | ICD-10-CM

## 2015-08-19 ENCOUNTER — Other Ambulatory Visit (HOSPITAL_COMMUNITY): Payer: Medicare PPO

## 2015-08-23 ENCOUNTER — Other Ambulatory Visit: Payer: Self-pay | Admitting: Cardiovascular Disease

## 2015-08-23 DIAGNOSIS — R0789 Other chest pain: Secondary | ICD-10-CM

## 2015-08-26 ENCOUNTER — Inpatient Hospital Stay (HOSPITAL_COMMUNITY): Admission: RE | Admit: 2015-08-26 | Payer: Medicare PPO | Source: Ambulatory Visit

## 2015-08-30 ENCOUNTER — Telehealth (HOSPITAL_COMMUNITY): Payer: Self-pay

## 2015-08-30 NOTE — Telephone Encounter (Signed)
Encounter complete. 

## 2015-09-01 ENCOUNTER — Inpatient Hospital Stay (HOSPITAL_COMMUNITY): Admission: RE | Admit: 2015-09-01 | Payer: Medicare PPO | Source: Ambulatory Visit

## 2015-09-13 ENCOUNTER — Ambulatory Visit: Payer: Medicare PPO | Admitting: Cardiovascular Disease

## 2015-09-27 ENCOUNTER — Ambulatory Visit: Payer: Self-pay | Admitting: Internal Medicine

## 2015-09-27 ENCOUNTER — Ambulatory Visit (INDEPENDENT_AMBULATORY_CARE_PROVIDER_SITE_OTHER): Payer: Medicare PPO | Admitting: Physician Assistant

## 2015-09-27 ENCOUNTER — Encounter: Payer: Self-pay | Admitting: Physician Assistant

## 2015-09-27 VITALS — BP 140/70 | HR 63 | Temp 97.3°F | Resp 16 | Ht 66.0 in | Wt 168.8 lb

## 2015-09-27 DIAGNOSIS — R6889 Other general symptoms and signs: Secondary | ICD-10-CM | POA: Diagnosis not present

## 2015-09-27 DIAGNOSIS — E559 Vitamin D deficiency, unspecified: Secondary | ICD-10-CM | POA: Diagnosis not present

## 2015-09-27 DIAGNOSIS — Z79899 Other long term (current) drug therapy: Secondary | ICD-10-CM | POA: Diagnosis not present

## 2015-09-27 DIAGNOSIS — I7300 Raynaud's syndrome without gangrene: Secondary | ICD-10-CM

## 2015-09-27 DIAGNOSIS — Z0001 Encounter for general adult medical examination with abnormal findings: Secondary | ICD-10-CM

## 2015-09-27 DIAGNOSIS — I10 Essential (primary) hypertension: Secondary | ICD-10-CM

## 2015-09-27 DIAGNOSIS — N183 Chronic kidney disease, stage 3 unspecified: Secondary | ICD-10-CM

## 2015-09-27 DIAGNOSIS — Z0000 Encounter for general adult medical examination without abnormal findings: Secondary | ICD-10-CM

## 2015-09-27 DIAGNOSIS — I493 Ventricular premature depolarization: Secondary | ICD-10-CM | POA: Diagnosis not present

## 2015-09-27 DIAGNOSIS — E782 Mixed hyperlipidemia: Secondary | ICD-10-CM

## 2015-09-27 DIAGNOSIS — E039 Hypothyroidism, unspecified: Secondary | ICD-10-CM | POA: Diagnosis not present

## 2015-09-27 DIAGNOSIS — K5790 Diverticulosis of intestine, part unspecified, without perforation or abscess without bleeding: Secondary | ICD-10-CM | POA: Diagnosis not present

## 2015-09-27 DIAGNOSIS — R7303 Prediabetes: Secondary | ICD-10-CM

## 2015-09-27 LAB — CBC WITH DIFFERENTIAL/PLATELET
Basophils Absolute: 63 {cells}/uL (ref 0–200)
Basophils Relative: 1 %
Eosinophils Absolute: 252 cells/uL (ref 15–500)
Eosinophils Relative: 4 %
HCT: 34.5 % — ABNORMAL LOW (ref 35.0–45.0)
Hemoglobin: 11 g/dL — ABNORMAL LOW (ref 11.7–15.5)
Lymphocytes Relative: 25 %
Lymphs Abs: 1575 cells/uL (ref 850–3900)
MCH: 27.4 pg (ref 27.0–33.0)
MCHC: 31.9 g/dL — ABNORMAL LOW (ref 32.0–36.0)
MCV: 85.8 fL (ref 80.0–100.0)
MPV: 11.3 fL (ref 7.5–12.5)
Monocytes Absolute: 567 {cells}/uL (ref 200–950)
Monocytes Relative: 9 %
Neutro Abs: 3843 cells/uL (ref 1500–7800)
Neutrophils Relative %: 61 %
Platelets: 258 10*3/uL (ref 140–400)
RBC: 4.02 MIL/uL (ref 3.80–5.10)
RDW: 14.2 % (ref 11.0–15.0)
WBC: 6.3 10*3/uL (ref 3.8–10.8)

## 2015-09-27 LAB — HEMOGLOBIN A1C
Hgb A1c MFr Bld: 6 % — ABNORMAL HIGH (ref <5.7)
Mean Plasma Glucose: 126 mg/dL

## 2015-09-27 LAB — LIPID PANEL
Cholesterol: 117 mg/dL — ABNORMAL LOW (ref 125–200)
HDL: 33 mg/dL — ABNORMAL LOW (ref >=46)
LDL Cholesterol: 58 mg/dL (ref <130)
Total CHOL/HDL Ratio: 3.5 Ratio (ref <=5.0)
Triglycerides: 131 mg/dL (ref <150)
VLDL: 26 mg/dL (ref <30)

## 2015-09-27 LAB — TSH: TSH: 0.24 mIU/L — ABNORMAL LOW

## 2015-09-27 LAB — BASIC METABOLIC PANEL WITH GFR
BUN: 18 mg/dL (ref 7–25)
CO2: 26 mmol/L (ref 20–31)
Calcium: 8.6 mg/dL (ref 8.6–10.4)
Chloride: 107 mmol/L (ref 98–110)
Creat: 1.07 mg/dL — ABNORMAL HIGH (ref 0.60–0.88)
GFR, Est African American: 56 mL/min — ABNORMAL LOW (ref >=60)
Potassium: 4.5 mmol/L (ref 3.5–5.3)
Sodium: 140 mmol/L (ref 135–146)

## 2015-09-27 LAB — HEPATIC FUNCTION PANEL
ALT: 11 U/L (ref 6–29)
AST: 20 U/L (ref 10–35)
Albumin: 3.7 g/dL (ref 3.6–5.1)
Alkaline Phosphatase: 78 U/L (ref 33–130)
Bilirubin, Direct: 0.1 mg/dL (ref <=0.2)
Indirect Bilirubin: 0.2 mg/dL (ref 0.2–1.2)
Total Bilirubin: 0.3 mg/dL (ref 0.2–1.2)
Total Protein: 6.4 g/dL (ref 6.1–8.1)

## 2015-09-27 LAB — BASIC METABOLIC PANEL WITHOUT GFR
GFR, Est Non African American: 49 mL/min — ABNORMAL LOW (ref >=60)
Glucose, Bld: 85 mg/dL (ref 65–99)

## 2015-09-27 LAB — MAGNESIUM: Magnesium: 1.9 mg/dL (ref 1.5–2.5)

## 2015-09-27 NOTE — Progress Notes (Signed)
MEDICARE ANNUAL WELLNESS VISIT AND FOLLOW UP  Assessment:   1. Essential hypertension - continue medications but cut verapamil in half, DASH diet, exercise and monitor at home. Call if greater than 130/80.  - CBC with Differential/Platelet  2. Prediabetes Discussed general issues about diabetes pathophysiology and management., Educational material distributed., Suggested low cholesterol diet., Encouraged aerobic exercise., Discussed foot care., Reminded to get yearly retinal exam. - HM DIABETES FOOT EXAM  3. CKD Stage III (GFR 56 ml/min) Increase fluids, avoid NSAIDS, monitor sugars, will monitor - BASIC METABOLIC PANEL WITH GFR  4. Hyperlipidemia -continue medications, check lipids, decrease fatty foods, increase activity.   5. Medication management - Magnesium  6. Vitamin D deficiency Continue supplement  7. Hypothyroidism, unspecified hypothyroidism type Hypothyroidism-check TSH level, continue medications the same, reminded to take on an empty stomach 30-73mins before food.   8. Unifocal PVCs Cut the verapamil in half, monitor foods, check labs  9. Diverticulosis of intestine without bleeding, unspecified intestinal tract location Controlled, diet  10. Raynaud phenomenon Declines meds at this time  11. Morbid obesity, unspecified obesity type ([place]) Obesity with co morbidities- long discussion about weight loss, diet, and exercise  12. Encounter for Medicare annual wellness exam  Over 30 minutes of exam, counseling, chart review, and critical decision making was performed  Plan:   During the course of the visit the patient was educated and counseled about appropriate screening and preventive services including:    Pneumococcal vaccine   Influenza vaccine  Td vaccine  Screening electrocardiogram  Screening mammography  Bone densitometry screening  Colorectal cancer screening  Diabetes screening  Glaucoma screening  Nutrition counseling   Advanced  directives: given info/requested  Conditions/risks identified: Diabetes is at goal, ACE/ARB therapy: Yes. Urinary Incontinence is not an issue: discussed non pharmacology and pharmacology options.  Fall risk: low- discussed PT, home fall assessment, medications.    Subjective:   Mikayla Harrison is a 82 y.o. female who presents for Medicare Annual Wellness Visit and 3 month follow up on hypertension, prediabetes, hyperlipidemia, vitamin D def.  Date of last medicare wellness visit was 11/2014.   Her blood pressure has been controlled at home, today their BP is BP: 140/70 mmHg. She has CKD stage III due to HTN.  She does workout. She denies chest pain, shortness of breath, dizziness.  She states she has not had any more SOB/palpitations since she has been on ibuprofen, does not want to get echo/stress test at this time due to cost.  She denies before this having any palpitations, CP, SOB. She has history of Raynauds but states she will just warm up her hands and does not want a medication.  She is not on cholesterol medication and denies myalgias. Her cholesterol is at goal. The cholesterol last visit was:   Lab Results  Component Value Date   CHOL 117* 09/27/2015   HDL 33* 09/27/2015   LDLCALC 58 09/27/2015   TRIG 131 09/27/2015   CHOLHDL 3.5 09/27/2015   Lab Results  Component Value Date   GFRNONAA 63* 09/27/2015   She has been working on diet and exercise for prediabetes, and denies paresthesia of the feet, polydipsia, polyuria and visual disturbances. She is on bASA.  Last A1C in the office was:  Lab Results  Component Value Date   HGBA1C 6.0* 09/27/2015   Patient is on Vitamin D supplement. Lab Results  Component Value Date   VD25OH 32 09/27/2015     She is on thyroid medication. Her  medication was not changed last visit.   Lab Results  Component Value Date   TSH 0.24* 09/27/2015   BMI is Body mass index is 27.26 kg/(m^2)., she is working on diet and exercise. Wt Readings  from Last 3 Encounters:  09/27/15 168 lb 12.8 oz (76.567 kg)  08/01/15 171 lb 12.8 oz (77.928 kg)  07/28/15 172 lb (78.019 kg)    Medication Review Current Outpatient Prescriptions on File Prior to Visit  Medication Sig Dispense Refill  . aspirin 81 MG tablet Take 81 mg by mouth daily.      . benazepril (LOTENSIN) 40 MG tablet Take 1 tablet (40 mg total) by mouth daily. 30 tablet 0  . levothyroxine (SYNTHROID, LEVOTHROID) 150 MCG tablet TAKE 1 TABLET DAILY 90 tablet 0   No current facility-administered medications on file prior to visit.    Current Problems (verified) Patient Active Problem List   Diagnosis Date Noted  . Encounter for Medicare annual wellness exam 09/27/2015  . Raynaud phenomenon 08/01/2015  . Morbid obesity ([place])  (BMI 30.41)  03/22/2015  . 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 CKD Stage III (GFR 56 ml/min) 03/16/2014  . Hyperlipidemia 10/05/2013  . Medication management 10/05/2013  . Vitamin D deficiency 10/05/2013  . Prediabetes   . Diverticulosis   . Hypertension 01/14/2012  . Hypothyroidism 01/14/2012    Screening Tests Immunization History  Administered Date(s) Administered  . DT 03/22/2015  . Influenza Split 03/04/2013  . Influenza, High Dose Seasonal PF 03/16/2014, 03/22/2015  . Pneumococcal Polysaccharide-23 03/16/2014    Preventative care: Last colonoscopy:  2004, will not get another EGD: 2012, Dr. Henrene Pastor Last mammogram: 2011, declines another and declines breast exam at this time.  Last pap smear/pelvic exam: remote DEXA:remote at Dr. Delanna Ahmadi office, willing to get  Prior vaccinations: TD or Tdap:2016 Influenza: 2016 Pneumococcal: 03/2014 Prevnar13: Due 03/2015 Shingles/Zostavax: declines  Names of Other Physician/Practitioners you currently use: 1. [facility]- here for primary care 2. Dr. Delman Cheadle, eye doctor, last visit 2-3 years 3. Dr. Derenda Mis, dentist, last visit q year 4. Dr. Henrene Pastor, GI Patient  Care Team: [name], MD as PCP - General (Internal Medicine)  Past Surgical History  Procedure Laterality Date  . Cataract extraction    . Vericose vein  1960  . Macular tear     Family History  Problem Relation Age of Onset  . Heart disease Mother   . Heart disease Father   . Colon polyps Sister   . Diabetes Sister   . Heart disease Brother    Social History  Substance Use Topics  . Smoking status: Former Smoker -- 1.00 packs/day for 15 years    Quit date: 06/11/1965  . Smokeless tobacco: Never Used  . Alcohol Use: No    MEDICARE WELLNESS OBJECTIVES: Tobacco use: She does not smoke.  Patient is a former smoker. Alcohol Current alcohol use: none Caffeine Current caffeine use: denies use Osteoporosis: postmenopausal estrogen deficiency and dietary calcium and/or vitamin D deficiency, History of fracture in the past year: no Diet: in general, a "healthy" diet   Physical activity: yard work and walking Depression/mood screen:   Depression screen [facility] 2/9 09/28/2015  Decreased Interest 0  Down, Depressed, Hopeless 0  PHQ - 2 Score 0   Hearing: normal Visual acuity: impaired,  does not perform annual eye exam  ADLs:  In your present state of health, do you have any difficulty performing the following activities: 09/28/2015 11/16/2014  Hearing? N N  Vision? Y Y  Difficulty concentrating or making decisions? N N  Walking or climbing stairs? N N  Dressing or bathing? N N  Doing errands, shopping? N N  Preparing Food and eating ? N N  Using the Toilet? N N  In the past six months, have you accidently leaked urine? N N  Do you have problems with loss of bowel control? N N  Managing your Medications? N N  Managing your Finances? N N  Housekeeping or managing your Housekeeping? N N    Fall risk: Low Risk Cognitive Testing  Alert? Yes  Normal Appearance?Yes  Oriented to person? Yes  Place? Yes   Time? Yes  Recall of three objects?  Yes  Can perform simple  calculations? Yes  Displays appropriate judgment?Yes  Can read the correct time from a watch face?Yes EOL planning: Does patient have an advance directive?: No Would patient like information on creating an advanced directive?: No - patient declined information No, declined information    Objective:   Blood pressure 140/70, pulse 63, temperature 97.3 F (36.3 C), temperature source Temporal, resp. rate 16, height 5\' 6"  (1.676 m), weight 168 lb 12.8 oz (76.567 kg), SpO2 99 %. Body mass index is 27.26 kg/(m^2).  General appearance: alert, no distress, WD/WN,  female HEENT: normocephalic, sclerae anicteric, TMs pearly, nares patent, no discharge or erythema, pharynx normal Oral cavity: MMM, no lesions Neck: supple, no lymphadenopathy, no thyromegaly, no masses Heart: RRR, normal S1, S2, no murmurs Lungs: CTA bilaterally, no wheezes, rhonchi, or rales Abdomen: +bs, soft, non tender, non distended, no masses, no hepatomegaly, no splenomegaly Musculoskeletal: nontender, no swelling, no obvious deformity Extremities: no edema, no cyanosis, no clubbing Pulses: 2+ symmetric, upper and lower extremities, normal cap refill Neurological: alert, oriented x 3, CN2-12 intact, strength normal upper extremities and lower extremities, sensation normal throughout, DTRs 2+ throughout, no cerebellar signs, gait normal Psychiatric: normal affect, behavior normal, pleasant  Breast: defer Gyn: defer Rectal: defer   Medicare Attestation I have personally reviewed: The patient's medical and social history Their use of alcohol, tobacco or illicit drugs Their current medications and supplements The patient's functional ability including ADLs,fall risks, home safety risks, cognitive, and hearing and visual impairment Diet and physical activities Evidence for depression or mood disorders  The patient's weight, height, BMI, and visual acuity have been recorded in the chart.  I have made referrals,  counseling, and provided education to the patient based on review of the above and I have provided the patient with a written personalized care plan for preventive services.     Vicie Mutters, PA-C   09/28/2015

## 2015-09-27 NOTE — Patient Instructions (Addendum)
Tumeric with black pepper extract is a great natural antiinflammatory that helps with arthritis and aches and pain. Can get from [employer] or any health food store. Need to take at least 846m twice a day with food.    Preventive Care for Adults A healthy lifestyle and preventive care can promote health and wellness. Preventive health guidelines for women include the following key practices.  A routine yearly physical is a good way to check with your health care provider about your health and preventive screening. It is a chance to share any concerns and updates on your health and to receive a thorough exam.  Visit your dentist for a routine exam and preventive care every 6 months. Brush your teeth twice a day and floss once a day. Good oral hygiene prevents tooth decay and gum disease.  The frequency of eye exams is based on your age, health, family medical history, use of contact lenses, and other factors. Follow your health care provider's recommendations for frequency of eye exams.  Eat a healthy diet. Foods like vegetables, fruits, whole grains, low-fat dairy products, and lean protein foods contain the nutrients you need without too many calories. Decrease your intake of foods high in solid fats, added sugars, and salt. Eat the right amount of calories for you.Get information about a proper diet from your health care provider, if necessary.  Regular physical exercise is one of the most important things you can do for your health. Most adults should get at least 150 minutes of moderate-intensity exercise (any activity that increases your heart rate and causes you to sweat) each week. In addition, most adults need muscle-strengthening exercises on 2 or more days a week.  Maintain a healthy weight. The body mass index (BMI) is a screening tool to identify possible weight problems. It provides an estimate of body fat based on height and weight. Your health care provider can find your BMI and can help  you achieve or maintain a healthy weight.For adults 20 years and older:  A BMI below 18.5 is considered underweight.  A BMI of 18.5 to 24.9 is normal.  A BMI of 25 to 29.9 is considered overweight.  A BMI of 30 and above is considered obese.  Maintain normal blood lipids and cholesterol levels by exercising and minimizing your intake of saturated fat. Eat a balanced diet with plenty of fruit and vegetables. If your lipid or cholesterol levels are high, you are over 50, or you are at high risk for heart disease, you may need your cholesterol levels checked more frequently.Ongoing high lipid and cholesterol levels should be treated with medicines if diet and exercise are not working.  If you smoke, find out from your health care provider how to quit. If you do not use tobacco, do not start.  Lung cancer screening is recommended for adults aged 518-80years who are at high risk for developing lung cancer because of a history of smoking. A yearly low-dose CT scan of the lungs is recommended for people who have at least a 30-pack-year history of smoking and are a current smoker or have quit within the past 15 years. A pack year of smoking is smoking an average of 1 pack of cigarettes a day for 1 year (for example: 1 pack a day for 30 years or 2 packs a day for 15 years). Yearly screening should continue until the smoker has stopped smoking for at least 15 years. Yearly screening should be stopped for people who develop a  health problem that would prevent them from having lung cancer treatment.  Avoid use of street drugs. Do not share needles with anyone. Ask for help if you need support or instructions about stopping the use of drugs.  High blood pressure causes heart disease and increases the risk of stroke.  Ongoing high blood pressure should be treated with medicines if weight loss and exercise do not work.  If you are 41-44 years old, ask your health care provider if you should take aspirin to  prevent strokes.  Diabetes screening involves taking a blood sample to check your fasting blood sugar level. This should be done once every 3 years, after age 12, if you are within normal weight and without risk factors for diabetes. Testing should be considered at a younger age or be carried out more frequently if you are overweight and have at least 1 risk factor for diabetes.  Breast cancer screening is essential preventive care for women. You should practice "breast self-awareness." This means understanding the normal appearance and feel of your breasts and may include breast self-examination. Any changes detected, no matter how small, should be reported to a health care provider. Women in their 30s and 30s should have a clinical breast exam (CBE) by a health care provider as part of a regular health exam every 1 to 3 years. After age 68, women should have a CBE every year. Starting at age 45, women should consider having a mammogram (breast X-ray test) every year. Women who have a family history of breast cancer should talk to their health care provider about genetic screening. Women at a high risk of breast cancer should talk to their health care providers about having an MRI and a mammogram every year.  Breast cancer gene (BRCA)-related cancer risk assessment is recommended for women who have family members with BRCA-related cancers. BRCA-related cancers include breast, ovarian, tubal, and peritoneal cancers. Having family members with these cancers may be associated with an increased risk for harmful changes (mutations) in the breast cancer genes BRCA1 and BRCA2. Results of the assessment will determine the need for genetic counseling and BRCA1 and BRCA2 testing.  Routine pelvic exams to screen for cancer are no longer recommended for nonpregnant women who are considered low risk for cancer of the pelvic organs (ovaries, uterus, and vagina) and who do not have symptoms. Ask your health care provider  if a screening pelvic exam is right for you.  If you have had past treatment for cervical cancer or a condition that could lead to cancer, you need Pap tests and screening for cancer for at least 20 years after your treatment. If Pap tests have been discontinued, your risk factors (such as having a new sexual partner) need to be reassessed to determine if screening should be resumed. Some women have medical problems that increase the chance of getting cervical cancer. In these cases, your health care provider may recommend more frequent screening and Pap tests.    Colorectal cancer can be detected and often prevented. Most routine colorectal cancer screening begins at the age of 66 years and continues through age 83 years. However, your health care provider may recommend screening at an earlier age if you have risk factors for colon cancer. On a yearly basis, your health care provider may provide home test kits to check for hidden blood in the stool. Use of a small camera at the end of a tube, to directly examine the colon (sigmoidoscopy or colonoscopy), can detect the  earliest forms of colorectal cancer. Talk to your health care provider about this at age 63, when routine screening begins. Direct exam of the colon should be repeated every 5-10 years through age 21 years, unless early forms of pre-cancerous polyps or small growths are found.  Osteoporosis is a disease in which the bones lose minerals and strength with aging. This can result in serious bone fractures or breaks. The risk of osteoporosis can be identified using a bone density scan. Women ages 82 years and over and women at risk for fractures or osteoporosis should discuss screening with their health care providers. Ask your health care provider whether you should take a calcium supplement or vitamin D to reduce the rate of osteoporosis.  Menopause can be associated with physical symptoms and risks. Hormone replacement therapy is available to  decrease symptoms and risks. You should talk to your health care provider about whether hormone replacement therapy is right for you.  Use sunscreen. Apply sunscreen liberally and repeatedly throughout the day. You should seek shade when your shadow is shorter than you. Protect yourself by wearing long sleeves, pants, a wide-brimmed hat, and sunglasses year round, whenever you are outdoors.  Once a month, do a whole body skin exam, using a mirror to look at the skin on your back. Tell your health care provider of new moles, moles that have irregular borders, moles that are larger than a pencil eraser, or moles that have changed in shape or color.  Stay current with required vaccines (immunizations).  Influenza vaccine. All adults should be immunized every year.  Tetanus, diphtheria, and acellular pertussis (Td, Tdap) vaccine. Pregnant women should receive 1 dose of Tdap vaccine during each pregnancy. The dose should be obtained regardless of the length of time since the last dose. Immunization is preferred during the 27th-36th week of gestation. An adult who has not previously received Tdap or who does not know her vaccine status should receive 1 dose of Tdap. This initial dose should be followed by tetanus and diphtheria toxoids (Td) booster doses every 10 years. Adults with an unknown or incomplete history of completing a 3-dose immunization series with Td-containing vaccines should begin or complete a primary immunization series including a Tdap dose. Adults should receive a Td booster every 10 years.    Zoster vaccine. One dose is recommended for adults aged 85 years or older unless certain conditions are present.    Pneumococcal 13-valent conjugate (PCV13) vaccine. When indicated, a person who is uncertain of her immunization history and has no record of immunization should receive the PCV13 vaccine. An adult aged 33 years or older who has certain medical conditions and has not been previously  immunized should receive 1 dose of PCV13 vaccine. This PCV13 should be followed with a dose of pneumococcal polysaccharide (PPSV23) vaccine. The PPSV23 vaccine dose should be obtained at least 8 weeks after the dose of PCV13 vaccine. An adult aged 40 years or older who has certain medical conditions and previously received 1 or more doses of PPSV23 vaccine should receive 1 dose of PCV13. The PCV13 vaccine dose should be obtained 1 or more years after the last PPSV23 vaccine dose.    Pneumococcal polysaccharide (PPSV23) vaccine. When PCV13 is also indicated, PCV13 should be obtained first. All adults aged 11 years and older should be immunized. An adult younger than age 8 years who has certain medical conditions should be immunized. Any person who resides in a nursing home or long-term care facility should be  immunized. An adult smoker should be immunized. People with an immunocompromised condition and certain other conditions should receive both PCV13 and PPSV23 vaccines. People with human immunodeficiency virus (HIV) infection should be immunized as soon as possible after diagnosis. Immunization during chemotherapy or radiation therapy should be avoided. Routine use of PPSV23 vaccine is not recommended for American Indians, [place] Natives, or people younger than 65 years unless there are medical conditions that require PPSV23 vaccine. When indicated, people who have unknown immunization and have no record of immunization should receive PPSV23 vaccine. One-time revaccination 5 years after the first dose of PPSV23 is recommended for people aged 19-64 years who have chronic kidney failure, nephrotic syndrome, asplenia, or immunocompromised conditions. People who received 1-2 doses of PPSV23 before age 52 years should receive another dose of PPSV23 vaccine at age 56 years or later if at least 5 years have passed since the previous dose. Doses of PPSV23 are not needed for people immunized with PPSV23 at or after  age 86 years.   Preventive Services / Frequency  Ages 28 years and over  Blood pressure check.  Lipid and cholesterol check.  Lung cancer screening. / Every year if you are aged 72-80 years and have a 30-pack-year history of smoking and currently smoke or have quit within the past 15 years. Yearly screening is stopped once you have quit smoking for at least 15 years or develop a health problem that would prevent you from having lung cancer treatment.  Clinical breast exam.** / Every year after age 44 years.  BRCA-related cancer risk assessment.** / For women who have family members with a BRCA-related cancer (breast, ovarian, tubal, or peritoneal cancers).  Mammogram.** / Every year beginning at age 45 years and continuing for as long as you are in good health. Consult with your health care provider.  Pap test.** / Every 3 years starting at age 20 years through age 20 or 106 years with 3 consecutive normal Pap tests. Testing can be stopped between 65 and 70 years with 3 consecutive normal Pap tests and no abnormal Pap or HPV tests in the past 10 years.  Fecal occult blood test (FOBT) of stool. / Every year beginning at age 61 years and continuing until age 85 years. You may not need to do this test if you get a colonoscopy every 10 years.  Flexible sigmoidoscopy or colonoscopy.** / Every 5 years for a flexible sigmoidoscopy or every 10 years for a colonoscopy beginning at age 57 years and continuing until age 32 years.  Hepatitis C blood test.** / For all people born from [birth year] through 1963 and any individual with known risks for hepatitis C.  Osteoporosis screening.** / A one-time screening for women ages 59 years and over and women at risk for fractures or osteoporosis.  Skin self-exam. / Monthly.  Influenza vaccine. / Every year.  Tetanus, diphtheria, and acellular pertussis (Tdap/Td) vaccine.** / 1 dose of Td every 10 years.  Zoster vaccine.** / 1 dose for adults aged 37 years  or older.  Pneumococcal 13-valent conjugate (PCV13) vaccine.** / Consult your health care provider.  Pneumococcal polysaccharide (PPSV23) vaccine.** / 1 dose for all adults aged 30 years and older. Screening for abdominal aortic aneurysm (AAA)  by ultrasound is recommended for people who have history of high blood pressure or who are current or former smokers.

## 2015-09-28 ENCOUNTER — Encounter: Payer: Self-pay | Admitting: Physician Assistant

## 2015-09-28 LAB — VITAMIN D 25 HYDROXY (VIT D DEFICIENCY, FRACTURES): Vit D, 25-Hydroxy: 32 ng/mL (ref 30–100)

## 2015-10-14 ENCOUNTER — Other Ambulatory Visit: Payer: Self-pay | Admitting: Physician Assistant

## 2015-10-14 ENCOUNTER — Other Ambulatory Visit: Payer: Self-pay | Admitting: Internal Medicine

## 2015-10-27 ENCOUNTER — Other Ambulatory Visit: Payer: Self-pay

## 2015-11-09 ENCOUNTER — Ambulatory Visit (INDEPENDENT_AMBULATORY_CARE_PROVIDER_SITE_OTHER): Payer: Medicare PPO | Admitting: *Deleted

## 2015-11-09 DIAGNOSIS — E039 Hypothyroidism, unspecified: Secondary | ICD-10-CM | POA: Diagnosis not present

## 2015-11-09 LAB — TSH: TSH: 0.36 mIU/L — ABNORMAL LOW

## 2015-11-09 NOTE — Progress Notes (Signed)
Patient ID: Mikayla Harrison, female   DOB: Jun 10, 1932, 82 y.o.   MRN: [id number] Patient presents for recheck TSH.  Patient currently taking Levothyroxine 150 mcg 1 pill times 6 days and 1/2 pill times 1 day.

## 2015-12-15 ENCOUNTER — Ambulatory Visit: Payer: Self-pay

## 2015-12-19 ENCOUNTER — Other Ambulatory Visit: Payer: Self-pay | Admitting: Internal Medicine

## 2016-02-06 ENCOUNTER — Ambulatory Visit: Payer: Self-pay | Admitting: Internal Medicine

## 2016-02-07 ENCOUNTER — Ambulatory Visit: Payer: Self-pay | Admitting: Internal Medicine

## 2016-02-14 ENCOUNTER — Encounter: Payer: Self-pay | Admitting: Internal Medicine

## 2016-02-14 ENCOUNTER — Ambulatory Visit (INDEPENDENT_AMBULATORY_CARE_PROVIDER_SITE_OTHER): Payer: Medicare PPO | Admitting: Internal Medicine

## 2016-02-14 ENCOUNTER — Other Ambulatory Visit: Payer: Self-pay | Admitting: Internal Medicine

## 2016-02-14 VITALS — BP 120/84 | HR 64 | Temp 97.6°F | Resp 16 | Ht 66.0 in | Wt 168.8 lb

## 2016-02-14 DIAGNOSIS — E782 Mixed hyperlipidemia: Secondary | ICD-10-CM

## 2016-02-14 DIAGNOSIS — Z23 Encounter for immunization: Secondary | ICD-10-CM

## 2016-02-14 DIAGNOSIS — E039 Hypothyroidism, unspecified: Secondary | ICD-10-CM

## 2016-02-14 DIAGNOSIS — I10 Essential (primary) hypertension: Secondary | ICD-10-CM | POA: Diagnosis not present

## 2016-02-14 DIAGNOSIS — R7303 Prediabetes: Secondary | ICD-10-CM

## 2016-02-14 DIAGNOSIS — Z79899 Other long term (current) drug therapy: Secondary | ICD-10-CM

## 2016-02-14 DIAGNOSIS — N183 Chronic kidney disease, stage 3 unspecified: Secondary | ICD-10-CM

## 2016-02-14 LAB — CBC WITH DIFFERENTIAL/PLATELET
Basophils Absolute: 0 cells/uL (ref 0–200)
Basophils Relative: 0 %
Eosinophils Absolute: 204 {cells}/uL (ref 15–500)
Eosinophils Relative: 3 %
HCT: 33.3 % — ABNORMAL LOW (ref 35.0–45.0)
Hemoglobin: 10.7 g/dL — ABNORMAL LOW (ref 11.7–15.5)
Lymphocytes Relative: 30 %
Lymphs Abs: 2040 {cells}/uL (ref 850–3900)
MCH: 28.1 pg (ref 27.0–33.0)
MCHC: 32.1 g/dL (ref 32.0–36.0)
MCV: 87.4 fL (ref 80.0–100.0)
MPV: 11.3 fL (ref 7.5–12.5)
Monocytes Absolute: 680 {cells}/uL (ref 200–950)
Monocytes Relative: 10 %
Neutro Abs: 3876 {cells}/uL (ref 1500–7800)
Neutrophils Relative %: 57 %
Platelets: 241 10*3/uL (ref 140–400)
RBC: 3.81 MIL/uL (ref 3.80–5.10)
RDW: 14 % (ref 11.0–15.0)
WBC: 6.8 10*3/uL (ref 3.8–10.8)

## 2016-02-14 LAB — TSH: TSH: 1.63 m[IU]/L

## 2016-02-14 MED ORDER — RANITIDINE HCL 300 MG PO TABS: ORAL_TABLET | ORAL | 1 refills | Status: DC

## 2016-02-14 NOTE — Patient Instructions (Signed)

## 2016-02-14 NOTE — Progress Notes (Signed)
[facility]                       [name], M.D.        Uvaldo Bristle. Silverio Lay, P.A.-C       Starlyn Skeans, P.A.-C  [facility]                [address] [place], [place] [id number] Telephone [phone] Telefax [phone] ______________________________________________________________________     This very nice 82y.o.WWF presents for follow up with Hypertension, Hyperlipidemia, Pre-Diabetes and Vitamin D Deficiency.      Patient is treated for HTN circa 1986 & BP has been controlled at home. Patient has CKD 3 attributed to her HTN.  Today's BP is  120/84. Patient has had no complaints of any cardiac type chest pain, palpitations, dyspnea/orthopnea/PND, dizziness, claudication, or dependent edema.     Hyperlipidemia is controlled with diet. Patient denies myalgias or other med SE's.  Last Lipids were at goal:  Lab Results  Component Value Date   CHOL 117 (L) 09/27/2015   HDL 33 (L) 09/27/2015   LDLCALC 58 09/27/2015   TRIG 131 09/27/2015   CHOLHDL 3.5 09/27/2015      Also, the patient has history of PreDiabetes with A1c 5.7% circa 2014 and has had no symptoms of reactive hypoglycemia, diabetic polys, paresthesias or visual blurring.  Last A1c was not at goal: Lab Results  Component Value Date   HGBA1C 6.0 (H) 09/27/2015      Treatment for Hypothyroidism was initiated in 1993. Further, the patient also has history of Vitamin D Deficiency in 2009 of "36" and "29" in 2016 and does not supplement Vit D as recommended numerous times. Last vitamin D was still very low & not at goal:  Lab Results  Component Value Date   VD25OH 32 09/27/2015   Current Outpatient Prescriptions on File Prior to Visit  Medication Sig  . aspirin 81 MG  Take 81 mg by mouth daily.    . Benazepril 40 MG TAKE 1 TABLET EVERY DAY  . levothyroxine 150 MCG TAKE 1 TABLET EVERY DAY   Allergies  Allergen Reactions  . Asa  [Aspirin]   . Codeine Nausea Only  . Prednisone Nausea And Vomiting  . Sulfa Antibiotics    PMHx:   Past Medical History:  Diagnosis Date  . Anemia   . Colon polyp   . Diverticulosis   . Hyperlipidemia   . Hypertension   . Hypothyroidism   . Iron deficiency   . Prediabetes   . Small bowel obstruction ([place])    Immunization History  Administered Date(s) Administered  . DT 03/22/2015  . Influenza Split 03/04/2013  . Influenza, High Dose Seasonal PF 03/16/2014, 03/22/2015  . Pneumococcal Polysaccharide-23 03/16/2014   Past Surgical History:  Procedure Laterality Date  . CATARACT EXTRACTION    . MACULAR TEAR    . Vericose vein  1960   FHx:    Reviewed / unchanged  SHx:    Reviewed / unchanged  Systems Review:  Constitutional: Denies fever, chills, wt changes, headaches, insomnia, fatigue, night sweats, change in appetite. Eyes: Denies redness, blurred vision, diplopia, discharge, itchy, watery eyes.  ENT: Denies discharge, congestion, post nasal drip, epistaxis, sore throat, earache, hearing loss, dental pain, tinnitus, vertigo, sinus pain, snoring.  CV: Denies chest pain, palpitations, irregular heartbeat, syncope, dyspnea,  diaphoresis, orthopnea, PND, claudication or edema. Respiratory: denies cough, dyspnea, DOE, pleurisy, hoarseness, laryngitis, wheezing.  Gastrointestinal: Denies dysphagia, odynophagia, heartburn, reflux, water brash, abdominal pain or cramps, nausea, vomiting, bloating, diarrhea, constipation, hematemesis, melena, hematochezia  or hemorrhoids. Genitourinary: Denies dysuria, frequency, urgency, nocturia, hesitancy, discharge, hematuria or flank pain. Musculoskeletal: Denies arthralgias, myalgias, stiffness, jt. swelling, pain, limping or strain/sprain.  Skin: Denies pruritus, rash, hives, warts, acne, eczema or change in skin lesion(s). Neuro: No weakness, tremor, incoordination, spasms, paresthesia or pain. Psychiatric: Denies confusion, memory loss or  sensory loss. Endo: Denies change in weight, skin or hair change.  Heme/Lymph: No excessive bleeding, bruising or enlarged lymph nodes.  Physical Exam BP 120/84   Pulse 64   Temp 97.6 F (36.4 C)   Resp 16   Ht 5\' 6"  (1.676 m)   Wt 168 lb 12.8 oz (76.6 kg)   BMI 27.25 kg/m   Appears well nourished and in no distress.  Eyes: PERRLA, EOMs, conjunctiva no swelling or erythema. Sinuses: No frontal/maxillary tenderness ENT/Mouth: EAC's clear, TM's nl w/o erythema, bulging. Nares clear w/o erythema, swelling, exudates. Oropharynx clear without erythema or exudates. Oral hygiene is good. Tongue normal, non obstructing. Hearing intact.  Neck: Supple. Thyroid nl. Car 2+/2+ without bruits, nodes or JVD. Chest: Respirations nl with BS clear & equal w/o rales, rhonchi, wheezing or stridor.  Cor: Heart sounds normal w/ regular rate and rhythm without sig. murmurs, gallops, clicks, or rubs. Peripheral pulses normal and equal  without edema.  Abdomen: Soft & bowel sounds normal. Non-tender w/o guarding, rebound, hernias, masses, or organomegaly.  Lymphatics: Unremarkable.  Musculoskeletal: Full ROM all peripheral extremities, joint stability, 5/5 strength, and normal gait.  Skin: Warm, dry without exposed rashes, lesions or ecchymosis apparent.  Neuro: Cranial nerves intact, reflexes equal bilaterally. Sensory-motor testing grossly intact. Tendon reflexes grossly intact.  Pysch: Alert & oriented x 3.  Insight and judgement nl & appropriate. No ideations.  Assessment and Plan:  1. Essential hypertension  - Continue medication, monitor blood pressure at home. Continue DASH diet. Reminder to go to the ER if any CP, SOB, nausea, dizziness, severe HA, changes vision/speech, left arm numbness and tingling and jaw pain. - TSH  2. Hyperlipidemia  - Continue diet/meds, exercise,& lifestyle modifications. Continue monitor periodic cholesterol/liver & renal functions  - Lipid panel - TSH  3.  Prediabetes  - Continue diet, exercise, lifestyle modifications. Monitor appropriate labs. - Hemoglobin A1c - Insulin, random  4. Morbid obesity due to excess calories (HCC)  - Continue supplementation. - VITAMIN D 25 Hydroxy   5. CKD Stage III (GFR 56 ml/min)   6. Hypothyroidism   7. Medication management  - CBC with Differential/Platelet - BASIC METABOLIC PANEL WITH GFR - Hepatic function panel - Magnesium      Recommended regular exercise, BP monitoring, weight control, and discussed med and SE's. Recommended labs to assess and monitor clinical status. Further disposition pending results of labs. Over 30 minutes of exam, counseling, chart review was performed

## 2016-02-15 ENCOUNTER — Other Ambulatory Visit: Payer: Self-pay | Admitting: *Deleted

## 2016-02-15 DIAGNOSIS — Z23 Encounter for immunization: Secondary | ICD-10-CM | POA: Diagnosis not present

## 2016-02-15 LAB — HEPATIC FUNCTION PANEL
ALT: 13 U/L (ref 6–29)
AST: 22 U/L (ref 10–35)
Albumin: 3.8 g/dL (ref 3.6–5.1)
Alkaline Phosphatase: 80 U/L (ref 33–130)
Bilirubin, Direct: 0.1 mg/dL (ref <=0.2)
Indirect Bilirubin: 0.3 mg/dL (ref 0.2–1.2)
Total Bilirubin: 0.4 mg/dL (ref 0.2–1.2)
Total Protein: 6.8 g/dL (ref 6.1–8.1)

## 2016-02-15 LAB — LIPID PANEL
Cholesterol: 150 mg/dL (ref 125–200)
HDL: 44 mg/dL — ABNORMAL LOW (ref >=46)
LDL Cholesterol: 83 mg/dL (ref <130)
Total CHOL/HDL Ratio: 3.4 Ratio (ref <=5.0)
Triglycerides: 116 mg/dL (ref <150)
VLDL: 23 mg/dL (ref <30)

## 2016-02-15 LAB — BASIC METABOLIC PANEL WITHOUT GFR
BUN: 13 mg/dL (ref 7–25)
Chloride: 103 mmol/L (ref 98–110)
GFR, Est African American: 59 mL/min — ABNORMAL LOW (ref >=60)
GFR, Est Non African American: 51 mL/min — ABNORMAL LOW (ref >=60)
Glucose, Bld: 113 mg/dL — ABNORMAL HIGH (ref 65–99)
Potassium: 4.4 mmol/L (ref 3.5–5.3)

## 2016-02-15 LAB — BASIC METABOLIC PANEL WITH GFR
CO2: 23 mmol/L (ref 20–31)
Calcium: 9 mg/dL (ref 8.6–10.4)
Creat: 1.03 mg/dL — ABNORMAL HIGH (ref 0.60–0.88)
Sodium: 139 mmol/L (ref 135–146)

## 2016-02-15 LAB — HEMOGLOBIN A1C
Hgb A1c MFr Bld: 5.8 % — ABNORMAL HIGH (ref <5.7)
Mean Plasma Glucose: 120 mg/dL

## 2016-02-15 LAB — VITAMIN D 25 HYDROXY (VIT D DEFICIENCY, FRACTURES): Vit D, 25-Hydroxy: 43 ng/mL (ref 30–100)

## 2016-02-15 LAB — INSULIN, RANDOM: Insulin: 16.3 u[IU]/mL (ref 2.0–19.6)

## 2016-02-15 LAB — MAGNESIUM: Magnesium: 1.9 mg/dL (ref 1.5–2.5)

## 2016-02-15 MED ORDER — RANITIDINE HCL 300 MG PO TABS: ORAL_TABLET | ORAL | 0 refills | Status: DC

## 2016-02-15 NOTE — Addendum Note (Signed)
Addended by: Melbourne Abts C on: 02/15/2016 08:18 AM   Modules accepted: Orders

## 2016-02-20 ENCOUNTER — Ambulatory Visit: Payer: Self-pay | Admitting: Physician Assistant

## 2016-02-20 ENCOUNTER — Ambulatory Visit (INDEPENDENT_AMBULATORY_CARE_PROVIDER_SITE_OTHER): Payer: Medicare PPO | Admitting: Physician Assistant

## 2016-02-20 ENCOUNTER — Encounter: Payer: Self-pay | Admitting: Physician Assistant

## 2016-02-20 ENCOUNTER — Other Ambulatory Visit: Payer: Self-pay | Admitting: Internal Medicine

## 2016-02-20 VITALS — BP 122/72 | HR 69 | Temp 97.6°F | Resp 14 | Wt 169.0 lb

## 2016-02-20 DIAGNOSIS — J0100 Acute maxillary sinusitis, unspecified: Secondary | ICD-10-CM | POA: Diagnosis not present

## 2016-02-20 DIAGNOSIS — L298 Other pruritus: Secondary | ICD-10-CM

## 2016-02-20 DIAGNOSIS — H9201 Otalgia, right ear: Secondary | ICD-10-CM

## 2016-02-20 DIAGNOSIS — N898 Other specified noninflammatory disorders of vagina: Secondary | ICD-10-CM

## 2016-02-20 MED ORDER — FLUCONAZOLE 150 MG PO TABS: 150.0000 mg | ORAL_TABLET | Freq: Every day | ORAL | 3 refills | Status: DC

## 2016-02-20 MED ORDER — AZITHROMYCIN 250 MG PO TABS: ORAL_TABLET | ORAL | 1 refills | Status: AC

## 2016-02-20 MED ORDER — MECLIZINE HCL 25 MG PO TABS: ORAL_TABLET | ORAL | 0 refills | Status: DC

## 2016-02-20 NOTE — Progress Notes (Signed)
   Subjective:    Patient ID: Mikayla Harrison, female    DOB: December 14, 1931, 82 y.o.   MRN: [id number]  HPI 82 y.o. WF with right ear pain and and vaginal itching x 1 week.  She has been using swimmer's ear drops that has helped, she has had some sinus congestion x 1-2 weeks, had vertigo while looking up/turning in bed, has had decreased hearing right ear, states has had this before with "fluid" in her ear, has had cough with yellow sputum. Denies fever, chills, SOB, changes in vision/speech, weakness.  She has had vaginal itching without discharge x 1 week, has not tried anything over the counter, some dysuria, no frequency/urgency.   Blood pressure 122/72, pulse 69, temperature 97.6 F (36.4 C), resp. rate 14, weight 169 lb (76.7 kg), SpO2 96 %.  Medications Current Outpatient Prescriptions on File Prior to Visit  Medication Sig  . aspirin 81 MG tablet Take 81 mg by mouth daily.    . ranitidine (ZANTAC) 300 MG tablet Take 1 to 2 tablets daily for heartburn & reflux   No current facility-administered medications on file prior to visit.     Problem list She has Hypertension; Hypothyroidism; Prediabetes; Diverticulosis; Hyperlipidemia; Medication management; Vitamin D deficiency; CKD Stage III (GFR 56 ml/min); Unifocal PVCs; Morbid obesity ([place])  (BMI 30.41) ; Raynaud phenomenon; and Encounter for Medicare annual wellness exam on her problem list.  Review of Systems  Constitutional: Negative for chills, diaphoresis and fever.  HENT: Positive for congestion, ear pain, postnasal drip, sinus pressure and sneezing. Negative for ear discharge, facial swelling and sore throat.   Respiratory: Positive for cough. Negative for chest tightness, shortness of breath and wheezing.   Cardiovascular: Negative.   Gastrointestinal: Negative.   Genitourinary: Positive for dysuria and vaginal pain (itching). Negative for decreased urine volume, difficulty urinating, dyspareunia, enuresis, flank pain, frequency,  genital sores, hematuria, menstrual problem, pelvic pain, urgency, vaginal bleeding and vaginal discharge.  Musculoskeletal: Negative for neck pain.  Neurological: Negative.  Negative for headaches.       Objective:   Physical Exam  Constitutional: She appears well-developed and well-nourished. No distress.  HENT:  Head: Normocephalic.  Right Ear: External ear and ear canal normal. No mastoid tenderness. Tympanic membrane is injected, erythematous and bulging. Tympanic membrane is not perforated and not retracted. A middle ear effusion is present.  Left Ear: A middle ear effusion is present.  Nose: Right sinus exhibits maxillary sinus tenderness. Left sinus exhibits maxillary sinus tenderness.  Mouth/Throat: Uvula is midline, oropharynx is clear and moist and mucous membranes are normal.  Eyes: Conjunctivae and EOM are normal. Pupils are equal, round, and reactive to light.  Neck: Normal range of motion. Neck supple.  Cardiovascular: Normal rate and regular rhythm.   Pulmonary/Chest: Effort normal and breath sounds normal. She has no wheezes.  Abdominal: Soft. Bowel sounds are normal.  Lymphadenopathy:    She has no cervical adenopathy.      Assessment & Plan:  Acute maxillary sinusitis, recurrence not specified and right ear pain Zpak, allergy pill, nasonex OTC, autoinflate  Vaginal itching Declines pelvic exam at this time, states "not feeling well enough" Will give diflucan, but explained likely due to vaginal atrophy will need exam to be certain, info given.

## 2016-02-20 NOTE — Patient Instructions (Addendum)
Your ears and sinuses are connected by the eustachian tube. When your sinuses are inflamed, this can close off the tube and cause fluid to collect in your middle ear. This can then cause dizziness, popping, clicking, ringing, and echoing in your ears.   This can take a long time to get better so please be patient.  Here are things you can do to help with this: - Try the Flonase or Nasonex. Remember to spray each nostril twice towards the outer part of your eye.  Do not sniff but instead pinch your nose and tilt your head back to help the medicine get into your sinuses.  The best time to do this is at bedtime.Stop if you get blurred vision or nose bleeds.  -While drinking fluids, pinch and hold nose close and swallow, to help open eustachian tubes to drain fluid behind ear drums. -Please pick one of the over the counter allergy medications below and take it once daily for allergies.  It will also help with fluid behind ear drums. Claritin or loratadine cheapest but likely the weakest  Zyrtec or certizine at night because it can make you sleepy The strongest is allegra or fexafinadine  Cheapest at [employer], [employer], [employer] -can use decongestant over the counter, please do not use if you have high blood pressure or certain heart conditions.   if worsening HA, changes vision/speech, imbalance, weakness go to the ER  Can get monostat over the counter  Atrophic Vaginitis Atrophic vaginitis is a condition in which the tissues that line the vagina become dry and thin. This condition is most common in women who have stopped having regular menstrual periods (menopause). This usually starts when a woman is 26-4 years old. Estrogen helps to keep the vagina moist. It stimulates the vagina to produce a clear fluid that lubricates the vagina for sexual intercourse. This fluid also protects the vagina from infection. Lack of estrogen can cause the lining of the vagina to get thinner and dryer. The vagina may also  shrink in size. It may become less elastic. Atrophic vaginitis tends to get worse over time as a woman's estrogen level drops. CAUSES This condition is caused by the normal drop in estrogen that happens around the time of menopause. RISK FACTORS Certain conditions or situations may lower a woman's estrogen level, which increases her risk of atrophic vaginitis. These include:  Taking medicine that blocks estrogen.  Having ovaries removed surgically.  Being treated for cancer with X-ray treatment (radiation) or medicines (chemotherapy).  Exercising very hard and often.  Having an eating disorder (anorexia).  Giving birth or breastfeeding.  Being over the age of 39.  Smoking. SYMPTOMS Symptoms of this condition include:  Pain, soreness, or bleeding during sexual intercourse (dyspareunia).  Vaginal burning, irritation, or itching.  Pain or bleeding during a vaginal examination using a speculum (pelvic exam).  Loss of interest in sexual activity.  Having burning pain when passing urine.  Vaginal discharge that is brown or yellow. In some cases, there are no symptoms. DIAGNOSIS This condition is diagnosed with a medical history and physical exam. This will include a pelvic exam that checks whether the inside of your vagina appears pale, thin, or dry. Rarely, you may also have other tests, including:  A urine test.  A test that checks the acid balance in your vaginal fluid (acid balance test). TREATMENT Treatment for this condition may depend on the severity of your symptoms. Treatment may include:  Using an over-the-counter vaginal lubricant before  you have sexual intercourse.  Using a long-acting vaginal moisturizer.  Using low-dose vaginal estrogen for moderate to severe symptoms that do not respond to other treatments. Options include creams, tablets, and inserts (vaginal rings). Before using vaginal estrogen, tell your health care provider if you have a history  of:  Breast cancer.  Endometrial cancer.  Blood clots.  Taking medicines. You may be able to take a daily pill for dyspareunia. Discuss all of the risks of this medicine with your health care provider. It is usually not recommended for women who have a family history or personal history of breast cancer. If your symptoms are very mild and you are not sexually active, you may not need treatment. HOME CARE INSTRUCTIONS  Take medicines only as directed by your health care provider. Do not use herbal or alternative medicines unless your health care provider says that you can.  Use over-the-counter creams, lubricants, or moisturizers for dryness only as directed by your health care provider.  If your atrophic vaginitis is caused by menopause, discuss all of your menopausal symptoms and treatment options with your health care provider.  Do not douche.  Do not use products that can make your vagina dry. These include:  Scented feminine sprays.  Scented tampons.  Scented soaps.  If it hurts to have sex, talk with your sexual partner. SEEK MEDICAL CARE IF:  Your discharge looks different than normal.  Your vagina has an unusual smell.  You have new symptoms.  Your symptoms do not improve with treatment.  Your symptoms get worse.   This information is not intended to replace advice given to you by your health care provider. Make sure you discuss any questions you have with your health care provider.   Document Released: 10/12/2014 Document Reviewed: 10/12/2014 Elsevier Interactive Patient Education [employer].

## 2016-04-16 ENCOUNTER — Encounter: Payer: Self-pay | Admitting: Physician Assistant

## 2016-04-16 ENCOUNTER — Ambulatory Visit (INDEPENDENT_AMBULATORY_CARE_PROVIDER_SITE_OTHER): Payer: Medicare PPO | Admitting: Physician Assistant

## 2016-04-16 VITALS — BP 120/68 | HR 75 | Temp 97.7°F | Resp 14 | Ht 66.0 in | Wt 165.0 lb

## 2016-04-16 DIAGNOSIS — J0100 Acute maxillary sinusitis, unspecified: Secondary | ICD-10-CM | POA: Diagnosis not present

## 2016-04-16 MED ORDER — AZITHROMYCIN 250 MG PO TABS: ORAL_TABLET | ORAL | 1 refills | Status: AC

## 2016-04-16 NOTE — Progress Notes (Signed)
   Subjective:    Patient ID: Mikayla Harrison, female    DOB: [birth year], 82 y.o.   MRN: [id number]  HPI 82 y.o. WF with sinus symptoms She has had sinus pain, ear pain right ear, possible temperature, cough without mucus. Denies fever, chills, SOB, changes in vision/speech, weakness.    Blood pressure 120/68, pulse 75, temperature 97.7 F (36.5 C), resp. rate 14, height 5\' 6"  (1.676 m), weight 165 lb (74.8 kg), SpO2 99 %.  Medications Current Outpatient Prescriptions on File Prior to Visit  Medication Sig  . aspirin 81 MG tablet Take 81 mg by mouth daily.    . benazepril (LOTENSIN) 40 MG tablet TAKE 1 TABLET EVERY DAY  . levothyroxine (SYNTHROID, LEVOTHROID) 150 MCG tablet TAKE 1 TABLET EVERY DAY  . meclizine (ANTIVERT) 25 MG tablet 1/2-1 pill up to 3 times daily for motion sickness/dizziness  . ranitidine (ZANTAC) 300 MG tablet Take 1 to 2 tablets daily for heartburn & reflux   No current facility-administered medications on file prior to visit.     Problem list She has Hypertension; Hypothyroidism; Prediabetes; Diverticulosis; Hyperlipidemia; Medication management; Vitamin D deficiency; CKD Stage III (GFR 56 ml/min); Unifocal PVCs; Morbid obesity ([place])  (BMI 30.41) ; Raynaud phenomenon; and Encounter for Medicare annual wellness exam on her problem list.  Review of Systems  Constitutional: Negative for chills, diaphoresis and fever.  HENT: Positive for congestion, ear pain, postnasal drip, sinus pressure and sneezing. Negative for ear discharge, facial swelling and sore throat.   Respiratory: Positive for cough. Negative for chest tightness, shortness of breath and wheezing.   Cardiovascular: Negative.   Gastrointestinal: Negative.   Genitourinary: Negative for decreased urine volume, difficulty urinating, dyspareunia, dysuria, enuresis, flank pain, frequency, genital sores, hematuria, menstrual problem, pelvic pain, urgency, vaginal bleeding, vaginal discharge and vaginal pain.   Musculoskeletal: Negative for neck pain.  Neurological: Negative.  Negative for headaches.       Objective:   Physical Exam  Constitutional: She appears well-developed and well-nourished. No distress.  HENT:  Head: Normocephalic.  Right Ear: External ear and ear canal normal. No mastoid tenderness. Tympanic membrane is injected, erythematous and bulging. Tympanic membrane is not perforated and not retracted. A middle ear effusion is present.  Left Ear: A middle ear effusion is present.  Nose: Right sinus exhibits maxillary sinus tenderness. Left sinus exhibits maxillary sinus tenderness.  Mouth/Throat: Uvula is midline, oropharynx is clear and moist and mucous membranes are normal.  Eyes: Conjunctivae and EOM are normal. Pupils are equal, round, and reactive to light.  Neck: Normal range of motion. Neck supple.  Cardiovascular: Normal rate and regular rhythm.   Pulmonary/Chest: Effort normal and breath sounds normal. She has no wheezes.  Abdominal: Soft. Bowel sounds are normal.  Lymphadenopathy:    She has no cervical adenopathy.      Assessment & Plan:  Acute maxillary sinusitis, recurrence not specified and right ear pain Zpak, allergy pill, nasonex OTC, autoinflate

## 2016-04-16 NOTE — Patient Instructions (Signed)

## 2016-04-17 ENCOUNTER — Encounter: Payer: Self-pay | Admitting: Internal Medicine

## 2016-04-23 ENCOUNTER — Other Ambulatory Visit: Payer: Self-pay | Admitting: Physician Assistant

## 2016-06-06 ENCOUNTER — Telehealth: Payer: Self-pay | Admitting: Physician Assistant

## 2016-06-06 MED ORDER — AZITHROMYCIN 250 MG PO TABS
ORAL_TABLET | ORAL | 1 refills | Status: AC
Start: 2016-06-06 — End: 2016-06-11

## 2016-06-06 MED ORDER — BENZONATATE 100 MG PO CAPS: 200.0000 mg | ORAL_CAPSULE | Freq: Three times a day (TID) | ORAL | 0 refills | Status: DC | PRN

## 2016-06-06 MED ORDER — PREDNISONE 20 MG PO TABS: ORAL_TABLET | ORAL | 0 refills | Status: DC

## 2016-06-06 NOTE — Telephone Encounter (Signed)
INFORMED PT THAT RX WAS SENT INTO HER PHARMACY

## 2016-06-06 NOTE — Telephone Encounter (Signed)
Patient 82 year old female with cough, insus infection x 5 days, getting worse, will send in tessalon, prednisone, and zpak, if not better needs OV, if worse symptoms or any CP, SOB, etc needs to go to ER

## 2016-07-05 ENCOUNTER — Encounter: Payer: Self-pay | Admitting: Internal Medicine

## 2016-07-11 ENCOUNTER — Other Ambulatory Visit: Payer: Self-pay | Admitting: Internal Medicine

## 2016-07-11 DIAGNOSIS — F411 Generalized anxiety disorder: Secondary | ICD-10-CM

## 2016-07-11 MED ORDER — CLORAZEPATE DIPOTASSIUM 7.5 MG PO TABS: ORAL_TABLET | ORAL | 0 refills | Status: AC

## 2016-07-18 ENCOUNTER — Ambulatory Visit (INDEPENDENT_AMBULATORY_CARE_PROVIDER_SITE_OTHER): Payer: Medicare HMO | Admitting: Internal Medicine

## 2016-07-18 ENCOUNTER — Encounter: Payer: Self-pay | Admitting: Internal Medicine

## 2016-07-18 VITALS — BP 134/72 | HR 64 | Temp 97.8°F | Resp 16 | Ht 66.0 in | Wt 163.4 lb

## 2016-07-18 DIAGNOSIS — Z79899 Other long term (current) drug therapy: Secondary | ICD-10-CM

## 2016-07-18 DIAGNOSIS — E559 Vitamin D deficiency, unspecified: Secondary | ICD-10-CM

## 2016-07-18 DIAGNOSIS — E782 Mixed hyperlipidemia: Secondary | ICD-10-CM

## 2016-07-18 DIAGNOSIS — R7303 Prediabetes: Secondary | ICD-10-CM

## 2016-07-18 DIAGNOSIS — Z136 Encounter for screening for cardiovascular disorders: Secondary | ICD-10-CM | POA: Diagnosis not present

## 2016-07-18 DIAGNOSIS — R002 Palpitations: Secondary | ICD-10-CM | POA: Diagnosis not present

## 2016-07-18 DIAGNOSIS — I10 Essential (primary) hypertension: Secondary | ICD-10-CM

## 2016-07-18 DIAGNOSIS — N183 Chronic kidney disease, stage 3 unspecified: Secondary | ICD-10-CM

## 2016-07-18 LAB — CBC WITH DIFFERENTIAL/PLATELET
Basophils Absolute: 60 {cells}/uL (ref 0–200)
Basophils Relative: 1 %
Eosinophils Absolute: 180 cells/uL (ref 15–500)
Eosinophils Relative: 3 %
HCT: 37.1 % (ref 35.0–45.0)
Hemoglobin: 12 g/dL (ref 11.7–15.5)
Lymphocytes Relative: 25 %
Lymphs Abs: 1500 cells/uL (ref 850–3900)
MCH: 28.4 pg (ref 27.0–33.0)
MCHC: 32.3 g/dL (ref 32.0–36.0)
MCV: 87.9 fL (ref 80.0–100.0)
MPV: 11 fL (ref 7.5–12.5)
Monocytes Absolute: 540 cells/uL (ref 200–950)
Monocytes Relative: 9 %
Neutro Abs: 3720 cells/uL (ref 1500–7800)
Neutrophils Relative %: 62 %
Platelets: 232 10*3/uL (ref 140–400)
RBC: 4.22 MIL/uL (ref 3.80–5.10)
RDW: 14 % (ref 11.0–15.0)
WBC: 6 10*3/uL (ref 3.8–10.8)

## 2016-07-18 LAB — BASIC METABOLIC PANEL WITH GFR
BUN: 20 mg/dL (ref 7–25)
Chloride: 104 mmol/L (ref 98–110)
Creat: 1.16 mg/dL — ABNORMAL HIGH (ref 0.60–0.88)
GFR, Est Non African American: 44 mL/min — ABNORMAL LOW (ref >=60)
Glucose, Bld: 75 mg/dL (ref 65–99)
Potassium: 4.1 mmol/L (ref 3.5–5.3)

## 2016-07-18 LAB — BASIC METABOLIC PANEL WITHOUT GFR
CO2: 27 mmol/L (ref 20–31)
Calcium: 9.6 mg/dL (ref 8.6–10.4)
GFR, Est African American: 51 mL/min — ABNORMAL LOW (ref >=60)
Sodium: 141 mmol/L (ref 135–146)

## 2016-07-18 LAB — LIPID PANEL
Cholesterol: 147 mg/dL (ref <200)
HDL: 41 mg/dL — ABNORMAL LOW (ref >50)
LDL Cholesterol: 84 mg/dL (ref <100)
Total CHOL/HDL Ratio: 3.6 ratio (ref <5.0)
Triglycerides: 112 mg/dL (ref <150)
VLDL: 22 mg/dL (ref <30)

## 2016-07-18 LAB — HEPATIC FUNCTION PANEL
ALT: 11 U/L (ref 6–29)
AST: 21 U/L (ref 10–35)
Albumin: 4 g/dL (ref 3.6–5.1)
Alkaline Phosphatase: 71 U/L (ref 33–130)
Bilirubin, Direct: 0.1 mg/dL (ref <=0.2)
Indirect Bilirubin: 0.3 mg/dL (ref 0.2–1.2)
Total Bilirubin: 0.4 mg/dL (ref 0.2–1.2)
Total Protein: 7.1 g/dL (ref 6.1–8.1)

## 2016-07-18 LAB — TSH: TSH: 3.64 mIU/L

## 2016-07-18 MED ORDER — ATENOLOL 50 MG PO TABS: ORAL_TABLET | ORAL | 1 refills | Status: DC

## 2016-07-18 NOTE — Progress Notes (Signed)
[facility] [name], M.D.        Uvaldo Bristle. Silverio Lay, P.A.-C       Starlyn Skeans, P.A.-C  [facility]                [address] [place], [place] [id number] Telephone [phone] Telefax [phone] ______________________________________________________________________     This very nice 83 y.o. WWF presents for 3 month follow up with Hypertension, Hyperlipidemia, Pre-Diabetes and Vitamin D Deficiency.      Patient is treated for HTN (1986) & BP has been controlled at home. Today's BP is at goal - 134/72. Patient does report occasional isolated palpitation at rest and denies any salvos or irregularity, Patient has had no complaints of any cardiac type chest pain,  dyspnea/orthopnea/PND, dizziness, claudication, or dependent edema.     Hyperlipidemia is controlled with diet & meds. Patient denies myalgias or other med SE's. Last Lipids were at goal: Lab Results  Component Value Date   CHOL 150 02/14/2016   HDL 44 (L) 02/14/2016   LDLCALC 83 02/14/2016   TRIG 116 02/14/2016   CHOLHDL 3.4 02/14/2016      Also, the patient has history of PreDiabetes (A1c 5.7% in 2014)  and has had no symptoms of reactive hypoglycemia, diabetic polys, paresthesias or visual blurring.  Last A1c was not at goal: Lab Results  Component Value Date   HGBA1C 5.8 (H) 02/14/2016      Patient has been on Thyroid replacement since 1993.  Further, the patient also has history of Vitamin D Deficiency ("29" in 2016) and supplements vitamin D sporadically. Last vitamin D was still low: Lab Results  Component Value Date   VD25OH 52 02/14/2016   Current Outpatient Prescriptions on File Prior to Visit  Medication Sig  . aspirin 81 MG tablet Take 81 mg by mouth daily.    . benazepril (LOTENSIN) 40 MG tablet TAKE 1 TABLET EVERY DAY  . clorazepate (TRANXENE-T) 7.5 MG tablet Take 1/2 to 1 tablet 2 to 3 x / day if needed for  anxiety  . levothyroxine (SYNTHROID, LEVOTHROID) 150 MCG tablet TAKE 1 TABLET EVERY DAY  . meclizine (ANTIVERT) 25 MG tablet 1/2-1 pill up to 3 times daily for motion sickness/dizziness   No current facility-administered medications on file prior to visit.    Allergies  Allergen Reactions  . Asa [Aspirin]   . Codeine Nausea Only  . Prednisone Nausea And Vomiting  . Sulfa Antibiotics    PMHx:   Past Medical History:  Diagnosis Date  . Anemia   . Colon polyp   . Diverticulosis   . Hyperlipidemia   . Hypertension   . Hypothyroidism   . Iron deficiency   . Prediabetes   . Small bowel obstruction    Immunization History  Administered Date(s) Administered  . DT 03/22/2015  . Influenza Split 03/04/2013  . Influenza, High Dose Seasonal PF 03/16/2014, 03/22/2015, 02/15/2016  . Pneumococcal Polysaccharide-23 03/16/2014   Past Surgical History:  Procedure Laterality Date  . CATARACT EXTRACTION    . MACULAR TEAR    . Vericose vein  1960   FHx:    Reviewed / unchanged  SHx:    Reviewed / unchanged  Systems Review:  Constitutional: Denies fever, chills, wt changes, headaches, insomnia, fatigue, night sweats, change in appetite. Eyes: Denies redness, blurred vision, diplopia, discharge, itchy, watery eyes.  ENT:  Denies discharge, congestion, post nasal drip, epistaxis, sore throat, earache, hearing loss, dental pain, tinnitus, vertigo, sinus pain, snoring.  CV: Denies chest pain, palpitations, irregular heartbeat, syncope, dyspnea, diaphoresis, orthopnea, PND, claudication or edema. Respiratory: denies cough, dyspnea, DOE, pleurisy, hoarseness, laryngitis, wheezing.  Gastrointestinal: Denies dysphagia, odynophagia, heartburn, reflux, water brash, abdominal pain or cramps, nausea, vomiting, bloating, diarrhea, constipation, hematemesis, melena, hematochezia  or hemorrhoids. Genitourinary: Denies dysuria, frequency, urgency, nocturia, hesitancy, discharge, hematuria or flank  pain. Musculoskeletal: Denies arthralgias, myalgias, stiffness, jt. swelling, pain, limping or strain/sprain.  Skin: Denies pruritus, rash, hives, warts, acne, eczema or change in skin lesion(s). Neuro: No weakness, tremor, incoordination, spasms, paresthesia or pain. Psychiatric: Denies confusion, memory loss or sensory loss. Endo: Denies change in weight, skin or hair change.  Heme/Lymph: No excessive bleeding, bruising or enlarged lymph nodes.  Physical Exam  BP 134/72   Pulse 64   Temp 97.8 F (36.6 C)   Resp 16   Ht 5\' 6"  (1.676 m)   Wt 163 lb 6.4 oz (74.1 kg)   BMI 26.37 kg/m   Appears well nourished and in no distress.  Eyes: PERRLA, EOMs, conjunctiva no swelling or erythema. Sinuses: No frontal/maxillary tenderness ENT/Mouth: EAC's clear, TM's nl w/o erythema, bulging. Nares clear w/o erythema, swelling, exudates. Oropharynx clear without erythema or exudates. Oral hygiene is good. Tongue normal, non obstructing. Hearing intact.  Neck: Supple. Thyroid nl. Car 2+/2+ without bruits, nodes or JVD. Chest: Respirations nl with BS clear & equal w/o rales, rhonchi, wheezing or stridor.  Cor: Heart sounds normal w/ regular rate and rhythm without sig. murmurs, gallops, clicks, or rubs. Peripheral pulses normal and equal  without edema.  Abdomen: Soft & bowel sounds normal. Non-tender w/o guarding, rebound, hernias, masses, or organomegaly.  Lymphatics: Unremarkable.  Musculoskeletal: Full ROM all peripheral extremities, joint stability, 5/5 strength, and normal gait.  Skin: Warm, dry without exposed rashes, lesions or ecchymosis apparent.  Neuro: Cranial nerves intact, reflexes equal bilaterally. Sensory-motor testing grossly intact. Tendon reflexes grossly intact.  Pysch: Alert & oriented x 3.  Insight and judgement nl & appropriate. No ideations.  Assessment and Plan:  1. Essential hypertension  - Continue medication, monitor blood pressure at home.  - Continue DASH diet.  Reminder to go to the ER if any CP,  SOB, nausea, dizziness, severe HA, changes vision/speech,  left arm numbness and tingling and jaw pain.  - atenolol (TENORMIN) 50 MG; Take 1 tab daily for BP and palpitations  Disp: 90; Rf: 1 - CBC with Differential/Platelet - BASIC METABOLIC PANEL WITH GFR - TSH - EKG 12-Lead  2. Hyperlipidemia  - Continue diet/meds, exercise,& lifestyle modifications.  - Continue monitor periodic cholesterol/liver & renal functions   - Hepatic function panel - Lipid panel - TSH  3. Prediabetes  - Continue diet, exercise, lifestyle modifications.  - Monitor appropriate labs. - Insulin, random  4. Vitamin D deficiency  - Continue supplementation.  - VITAMIN D 25 Hydroxy   5. CKD Stage III (GFR 56 ml/min)   6. Palpitations  - atenolol (TENORMIN) 50 MG; Take 1 tablet daily for BP and palpitations  Disp: 90 ; Rf: 1 - TSH - EKG 12-Lead  7. Medication management  - CBC with Differential/Platelet - BASIC METABOLIC PANEL WITH GFR - Hepatic function panel - Magnesium - Lipid panel - TSH - Hemoglobin A1c - VITAMIN D 25 Hydroxy   8. Screening for ischemic heart disease  - EKG - NSR - WNL  Recommended regular exercise, BP monitoring, weight control, and discussed med and SE's. Recommended labs to assess and monitor clinical status. Further disposition pending results of labs. Over 30 minutes of exam, counseling, chart review was performed

## 2016-07-18 NOTE — Patient Instructions (Addendum)
Cut Benazepril 40 mg dose in 1/2   when add on new Rx for Atenolol  ++++++++++++++++++++++++++ Recommend Adult Low Dose Aspirin or  coated  Aspirin 81 mg daily  To reduce risk of Colon Cancer 20 %,  Skin Cancer 26 % ,  Melanoma 46%  and  Pancreatic cancer 60% +++++++++++++++++++++++++ Vitamin D goal  is between 70-100.  Please make sure that you are taking your Vitamin D as directed.  It is very important as a natural anti-inflammatory  helping hair, skin, and nails, as well as reducing stroke and heart attack risk.  It helps your bones and helps with mood. It also decreases numerous cancer risks so please take it as directed.  Low Vit D is associated with a 200-300% higher risk for CANCER  and 200-300% higher risk for HEART   ATTACK  &  STROKE.   .....................................Marland Kitchen It is also associated with higher death rate at younger ages,  autoimmune diseases like Rheumatoid arthritis, Lupus, Multiple Sclerosis.    Also many other serious conditions, like depression, Alzheimer's Dementia, infertility, muscle aches, fatigue, fibromyalgia - just to name a few. ++++++++++++++++++++ Recommend the book "The END of DIETING" by Dr Excell Seltzer  & the book "The END of DIABETES " by Dr Excell Seltzer At [email or web address] - get book & Audio CD's    Being diabetic has a  300% increased risk for heart attack, stroke, cancer, and alzheimer- type vascular dementia. It is very important that you work harder with diet by avoiding all foods that are white. Avoid white rice (brown & wild rice is OK), white potatoes (sweetpotatoes in moderation is OK), White bread or wheat bread or anything made out of white flour like bagels, donuts, rolls, buns, biscuits, cakes, pastries, cookies, pizza crust, and pasta (made from white flour & egg whites) - vegetarian pasta or spinach or wheat pasta is OK. Multigrain breads like Arnold's or Pepperidge [employer], or multigrain sandwich thins or flatbreads.  Diet, exercise  and weight loss can reverse and cure diabetes in the early stages.  Diet, exercise and weight loss is very important in the control and prevention of complications of diabetes which affects every system in your body, ie. Brain - dementia/stroke, eyes - glaucoma/blindness, heart - heart attack/heart failure, kidneys - dialysis, stomach - gastric paralysis, intestines - malabsorption, nerves - severe painful neuritis, circulation - gangrene & loss of a leg(s), and finally cancer and Alzheimers.    I recommend avoid fried & greasy foods,  sweets/candy, white rice (brown or wild rice or Quinoa is OK), white potatoes (sweet potatoes are OK) - anything made from white flour - bagels, doughnuts, rolls, buns, biscuits,white and wheat breads, pizza crust and traditional pasta made of white flour & egg white(vegetarian pasta or spinach or wheat pasta is OK).  Multi-grain bread is OK - like multi-grain flat bread or sandwich thins. Avoid alcohol in excess. Exercise is also important.    Eat all the vegetables you want - avoid meat, especially red meat and dairy - especially cheese.  Cheese is the most concentrated form of trans-fats which is the worst thing to clog up our arteries. Veggie cheese is OK which can be found in the fresh produce section at [employer] or [employer] or [employer]  +++++++++++++++++++++ DASH Eating Plan  DASH stands for "Dietary Approaches to Stop Hypertension."   The DASH eating plan is a healthy eating plan that has been shown to reduce high blood pressure (hypertension). Additional health benefits may include  reducing the risk of type 2 diabetes mellitus, heart disease, and stroke. The DASH eating plan may also help with weight loss. WHAT DO I NEED TO KNOW ABOUT THE DASH EATING PLAN? For the DASH eating plan, you will follow these general guidelines:  Choose foods with a percent daily value for sodium of less than 5% (as listed on the food label).  Use salt-free seasonings or  herbs instead of table salt or sea salt.  Check with your health care provider or pharmacist before using salt substitutes.  Eat lower-sodium products, often labeled as "lower sodium" or "no salt added."  Eat fresh foods.  Eat more vegetables, fruits, and low-fat dairy products.  Choose whole grains. Look for the word "whole" as the first word in the ingredient list.  Choose fish   Limit sweets, desserts, sugars, and sugary drinks.  Choose heart-healthy fats.  Eat veggie cheese   Eat more home-cooked food and less restaurant, buffet, and fast food.  Limit fried foods.  Cook foods using methods other than frying.  Limit canned vegetables. If you do use them, rinse them well to decrease the sodium.  When eating at a restaurant, ask that your food be prepared with less salt, or no salt if possible.                      WHAT FOODS CAN I EAT? Read Dr Fara Olden Fuhrman's books on The End of Dieting & The End of Diabetes  Grains Whole grain or whole wheat bread. Brown rice. Whole grain or whole wheat pasta. Quinoa, bulgur, and whole grain cereals. Low-sodium cereals. Corn or whole wheat flour tortillas. Whole grain cornbread. Whole grain crackers. Low-sodium crackers.  Vegetables Fresh or frozen vegetables (raw, steamed, roasted, or grilled). Low-sodium or reduced-sodium tomato and vegetable juices. Low-sodium or reduced-sodium tomato sauce and paste. Low-sodium or reduced-sodium canned vegetables.   Fruits All fresh, canned (in natural juice), or frozen fruits.  Protein Products  All fish and seafood.  Dried beans, peas, or lentils. Unsalted nuts and seeds. Unsalted canned beans.  Dairy Low-fat dairy products, such as skim or 1% milk, 2% or reduced-fat cheeses, low-fat ricotta or cottage cheese, or plain low-fat yogurt. Low-sodium or reduced-sodium cheeses.  Fats and Oils Tub margarines without trans fats. Light or reduced-fat mayonnaise and salad dressings (reduced sodium).  Avocado. Safflower, olive, or canola oils. Natural peanut or almond butter.  Other Unsalted popcorn and pretzels. The items listed above may not be a complete list of recommended foods or beverages. Contact your dietitian for more options.  +++++++++++++++  WHAT FOODS ARE NOT RECOMMENDED? Grains/ White flour or wheat flour White bread. White pasta. White rice. Refined cornbread. Bagels and croissants. Crackers that contain trans fat.  Vegetables  Creamed or fried vegetables. Vegetables in a . Regular canned vegetables. Regular canned tomato sauce and paste. Regular tomato and vegetable juices.  Fruits Dried fruits. Canned fruit in light or heavy syrup. Fruit juice.  Meat and Other Protein Products Meat in general - RED meat & White meat.  Fatty cuts of meat. Ribs, chicken wings, all processed meats as bacon, sausage, bologna, salami, fatback, hot dogs, bratwurst and packaged luncheon meats.  Dairy Whole or 2% milk, cream, half-and-half, and cream cheese. Whole-fat or sweetened yogurt. Full-fat cheeses or blue cheese. Non-dairy creamers and whipped toppings. Processed cheese, cheese spreads, or cheese curds.  Condiments Onion and garlic salt, seasoned salt, table salt, and sea salt. Canned and packaged gravies. Worcestershire sauce.  Tartar sauce. Barbecue sauce. Teriyaki sauce. Soy sauce, including reduced sodium. Steak sauce. Fish sauce. Oyster sauce. Cocktail sauce. Horseradish. Ketchup and mustard. Meat flavorings and tenderizers. Bouillon cubes. Hot sauce. Tabasco sauce. Marinades. Taco seasonings. Relishes.  Fats and Oils Butter, stick margarine, lard, shortening and bacon fat. Coconut, palm kernel, or palm oils. Regular salad dressings.  Pickles and olives. Salted popcorn and pretzels.  The items listed above may not be a complete list of foods and beverages to avoid.

## 2016-07-19 LAB — VITAMIN D 25 HYDROXY (VIT D DEFICIENCY, FRACTURES): Vit D, 25-Hydroxy: 56 ng/mL (ref 30–100)

## 2016-07-19 LAB — HEMOGLOBIN A1C
Hgb A1c MFr Bld: 5.8 % — ABNORMAL HIGH (ref <5.7)
Mean Plasma Glucose: 120 mg/dL

## 2016-07-19 LAB — INSULIN, RANDOM: Insulin: 10.1 u[IU]/mL (ref 2.0–19.6)

## 2016-07-19 LAB — MAGNESIUM: Magnesium: 1.9 mg/dL (ref 1.5–2.5)

## 2016-07-23 ENCOUNTER — Telehealth: Payer: Self-pay | Admitting: *Deleted

## 2016-07-23 NOTE — Telephone Encounter (Signed)
Patient called and stats she made the changes in her BP medication, as instructed at her last OV.  She is taking Benicare 40 mg 1/2 tablet daily and added new RX for Atenolol 50 mg daily.  She says she feels fine and at home her BP is 142/70.  The patient was advised to to continue meds the same and keep checking her BP and call with the readings.

## 2016-07-25 ENCOUNTER — Telehealth: Payer: Self-pay | Admitting: *Deleted

## 2016-07-25 NOTE — Telephone Encounter (Signed)
Patient confirmed she is taking Benazepril, not Benicar.  Benazepril is the correct medication.

## 2016-07-25 NOTE — Telephone Encounter (Signed)
Patient called and states her BP has been elevated today to 148/102 at the drug store.  The patient took 1/4 tablet of her Benazepril 40 mg.  Per Dr Melford Aase, she can take 1/2 of the 40 mg Benazepril this PM.  She should start taking her Atenolol 50 mg in the mornings and the Benazepril 40 mg 1 tablet in the evenings. Patient aware and will monitor her BPs.

## 2016-07-26 ENCOUNTER — Other Ambulatory Visit: Payer: Self-pay | Admitting: Internal Medicine

## 2016-07-26 NOTE — Progress Notes (Signed)
26 Jul 2016    At 6:15 PM  S - patient took Atenolol 50 mg this am and BP at Drug store about 4 pm was 184/84 so came home and took Benazepril 40 mg - Neg HT sx's.   P- Recc take extra Atenolol 50 mg now and take Atenolol 50 mg in am til sees Camanche in Am for appt.   - Patient reassured

## 2016-07-27 ENCOUNTER — Ambulatory Visit (INDEPENDENT_AMBULATORY_CARE_PROVIDER_SITE_OTHER): Payer: Medicare HMO | Admitting: Physician Assistant

## 2016-07-27 ENCOUNTER — Encounter: Payer: Self-pay | Admitting: Physician Assistant

## 2016-07-27 VITALS — BP 130/64 | HR 54 | Temp 98.0°F | Resp 16 | Ht 66.0 in | Wt 165.0 lb

## 2016-07-27 DIAGNOSIS — R002 Palpitations: Secondary | ICD-10-CM | POA: Diagnosis not present

## 2016-07-27 DIAGNOSIS — I10 Essential (primary) hypertension: Secondary | ICD-10-CM | POA: Diagnosis not present

## 2016-07-27 NOTE — Progress Notes (Signed)
Assessment and Plan: Palpitations BP better, do atenolol 25mg , can take other 1/2 if palpitations, and take benazepril 40mg  No accompaniments continue BB, if worse follow up with cardiology for holter Increase water, decrease alcohol/caffeine, valsalva maneuvers taugh  Future Appointments Date Time Provider [place]  09/27/2016 2:00 PM [name], MD [facility] None    HPI 83 y.o.female with history of HTN, TSH presents with BP concerns. She is on atenolol 50mg  and benazepril 1/2 in the afternoon. She will have palpitations without accompaniments, worse with anxiety, occ with last 20-30 mins.   Blood pressure 130/64, pulse (!) 54, temperature 98 F (36.7 C), temperature source Temporal, resp. rate 16, height 5\' 6"  (1.676 m), weight 165 lb (74.8 kg).  Lab Results  Component Value Date   TSH 3.64 07/18/2016    Past Medical History:  Diagnosis Date  . Anemia   . Colon polyp   . Diverticulosis   . Hyperlipidemia   . Hypertension   . Hypothyroidism   . Iron deficiency   . Prediabetes   . Small bowel obstruction      Allergies  Allergen Reactions  . Asa [Aspirin]   . Codeine Nausea Only  . Prednisone Nausea And Vomiting  . Sulfa Antibiotics     Current Outpatient Prescriptions on File Prior to Visit  Medication Sig  . aspirin 81 MG tablet Take 81 mg by mouth daily.    Marland Kitchen atenolol (TENORMIN) 50 MG tablet Take 1 tablet daily for BP and palpitations  . benazepril (LOTENSIN) 40 MG tablet TAKE 1 TABLET EVERY DAY  . clorazepate (TRANXENE-T) 7.5 MG tablet Take 1/2 to 1 tablet 2 to 3 x / day if needed for anxiety  . levothyroxine (SYNTHROID, LEVOTHROID) 150 MCG tablet TAKE 1 TABLET EVERY DAY  . meclizine (ANTIVERT) 25 MG tablet 1/2-1 pill up to 3 times daily for motion sickness/dizziness   No current facility-administered medications on file prior to visit.     ROS: all negative except above.   Physical Exam: Filed Weights   07/27/16 0959  Weight: 165 lb  (74.8 kg)   BP 130/64   Pulse (!) 54   Temp 98 F (36.7 C) (Temporal)   Resp 16   Ht 5\' 6"  (1.676 m)   Wt 165 lb (74.8 kg)   BMI 26.63 kg/m  General Appearance: Well nourished, in no apparent distress. Eyes: PERRLA, EOMs, conjunctiva no swelling or erythema Sinuses: No Frontal/maxillary tenderness ENT/Mouth: Ext aud canals clear, TMs without erythema, bulging. No erythema, swelling, or exudate on post pharynx.  Tonsils not swollen or erythematous. Hearing normal.  Neck: Supple, thyroid normal.  Respiratory: Respiratory effort normal, BS equal bilaterally without rales, rhonchi, wheezing or stridor.  Cardio: RRR with no MRGs. Brisk peripheral pulses without edema.  Abdomen: Soft, + BS.  Non tender, no guarding, rebound, hernias, masses. Lymphatics: Non tender without lymphadenopathy.  Musculoskeletal: Full ROM, 5/5 strength, normal gait.  Skin: Warm, dry without rashes, lesions, ecchymosis.  Neuro: Cranial nerves intact. Normal muscle tone, no cerebellar symptoms. Sensation intact.  Psych: Awake and oriented X 3, normal affect, Insight and Judgment appropriate.     Vicie Mutters, PA-C 10:10 AM [facility]

## 2016-07-27 NOTE — Patient Instructions (Addendum)
Increase fluids, you were dehydrated last time and this can cause palpitations, want 64 oz a day at least Atenolol 1/2 at night so 25mg , can take extra 25mg  or 1/2 if you have any palpitations and do benazepril 40mg  a day  Valsalva maneuver if you have any palpitations like bearing down like having baby/BM or like blowing through a straw   Palpitations A palpitation is the feeling that your heartbeat is irregular or is faster than normal. It may feel like your heart is fluttering or skipping a beat. Palpitations are usually not a serious problem. They may be caused by many things, including smoking, caffeine, alcohol, stress, and certain medicines. Although most causes of palpitations are not serious, palpitations can be a sign of a serious medical problem. In some cases, you may need further medical evaluation. Follow these instructions at home: Pay attention to any changes in your symptoms. Take these actions to help with your condition:  Avoid the following:  Caffeinated coffee, tea, soft drinks, diet pills, and energy drinks.  Chocolate.  Alcohol.  Do not use any tobacco products, such as cigarettes, chewing tobacco, and e-cigarettes. If you need help quitting, ask your health care provider.  Try to reduce your stress and anxiety. Things that can help you relax include:  Yoga.  Meditation.  Physical activity, such as swimming, jogging, or walking.  Biofeedback. This is a method that helps you learn to use your mind to control things in your body, such as your heartbeats.  Get plenty of rest and sleep.  Take over-the-counter and prescription medicines only as told by your health care provider.  Keep all follow-up visits as told by your health care provider. This is important. Contact a health care provider if:  You continue to have a fast or irregular heartbeat after 24 hours.  Your palpitations occur more often. Get help right away if:  You have chest pain or shortness  of breath.  You have a severe headache.  You feel dizzy or you faint. This information is not intended to replace advice given to you by your health care provider. Make sure you discuss any questions you have with your health care provider. Document Released: 05/25/2000 Document Revised: 10/31/2015 Document Reviewed: 02/10/2015 Elsevier Interactive Patient Education  2017 [employer].

## 2016-09-18 ENCOUNTER — Ambulatory Visit (INDEPENDENT_AMBULATORY_CARE_PROVIDER_SITE_OTHER): Payer: Medicare HMO | Admitting: Internal Medicine

## 2016-09-18 ENCOUNTER — Encounter: Payer: Self-pay | Admitting: Internal Medicine

## 2016-09-18 VITALS — BP 132/76 | HR 60 | Temp 97.2°F | Resp 16 | Ht 62.5 in | Wt 163.2 lb

## 2016-09-18 DIAGNOSIS — Z1212 Encounter for screening for malignant neoplasm of rectum: Secondary | ICD-10-CM

## 2016-09-18 DIAGNOSIS — Z136 Encounter for screening for cardiovascular disorders: Secondary | ICD-10-CM | POA: Diagnosis not present

## 2016-09-18 DIAGNOSIS — N183 Chronic kidney disease, stage 3 unspecified: Secondary | ICD-10-CM

## 2016-09-18 DIAGNOSIS — E559 Vitamin D deficiency, unspecified: Secondary | ICD-10-CM

## 2016-09-18 DIAGNOSIS — Z79899 Other long term (current) drug therapy: Secondary | ICD-10-CM

## 2016-09-18 DIAGNOSIS — Z0000 Encounter for general adult medical examination without abnormal findings: Secondary | ICD-10-CM

## 2016-09-18 DIAGNOSIS — R7303 Prediabetes: Secondary | ICD-10-CM | POA: Diagnosis not present

## 2016-09-18 DIAGNOSIS — I10 Essential (primary) hypertension: Secondary | ICD-10-CM | POA: Diagnosis not present

## 2016-09-18 DIAGNOSIS — E782 Mixed hyperlipidemia: Secondary | ICD-10-CM

## 2016-09-18 DIAGNOSIS — Z0001 Encounter for general adult medical examination with abnormal findings: Secondary | ICD-10-CM

## 2016-09-18 NOTE — Progress Notes (Signed)
[facility] [name], M.D.    Uvaldo Bristle. Silverio Lay, P.A.-C      Starlyn Skeans, P.A.-C  [facility]                [address] [place] [address] Telephone [phone] Telefax [phone]  Annual Screening/Preventative Visit & Comprehensive Evaluation &  Examination     This very nice 83 y.o. [facility]  presents for a Screening/Preventative Visit & comprehensive evaluation and management of multiple medical co-morbidities.  Patient has been followed for HTN, Prediabetes, Hyperlipidemia and Vitamin D Deficiency.      HTN predates since 60. Patient's BP has been controlled at home and patient denies any cardiac symptoms as chest pain, palpitations, shortness of breath, dizziness or ankle swelling. Today's BP at goal - 132/76.      Patient's hyperlipidemia is controlled with diet and medications. Patient denies myalgias or other medication SE's. Last lipids were at goal: Lab Results  Component Value Date   CHOL 147 07/18/2016   HDL 41 (L) 07/18/2016   LDLCALC 84 07/18/2016   TRIG 112 07/18/2016   CHOLHDL 3.6 07/18/2016      Patient has prediabetes (A1c 5.7% in 2014) and patient denies reactive hypoglycemic symptoms, visual blurring, diabetic polys, or paresthesias. Last A1c was still  Not at goal: Lab Results  Component Value Date   HGBA1C 5.8 (H) 07/18/2016      Patient has been on Thyroid Replacement since 1993.  Finally, patient has history of Vitamin D Deficiency  ("36" in 2009 and "29" in 2016) and last Vitamin D was at goal: Lab Results  Component Value Date   VD25OH 56 07/18/2016   Current Outpatient Prescriptions on File Prior to Visit  Medication Sig  . aspirin 81 MG tablet Take 81 mg by mouth daily.    Marland Kitchen atenolol  50 MG tablet Take 1 tablet daily for BP and palpitations  . benazepril  40 MG tablet TAKE 1 TABLET EVERY DAY  . gTRANXENE-T 7.5 MG tablet Take 1/2 to 1 tablet 2 to  3 x / day if needed for anxiety  . levothyroxine  150 MCG tablet TAKE 1 TABLET EVERY DAY  . meclizine ( 25 MG tablet 1/2-1 pill up to 3 times daily for motion sickness/dizziness   Allergies  Allergen Reactions  . Asa [Aspirin]   . Codeine Nausea Only  . Prednisone Nausea And Vomiting  . Sulfa Antibiotics    Past Medical History:  Diagnosis Date  . Anemia   . Colon polyp   . Diverticulosis   . Hyperlipidemia   . Hypertension   . Hypothyroidism   . Iron deficiency   . Prediabetes   . Small bowel obstruction    Health Maintenance  Topic Date Due  . TETANUS/TDAP  04/05/2017 (Originally 02/25/1951)  . INFLUENZA VACCINE  01/09/2017  . DEXA SCAN  Addressed  . PNA vac Low Risk Adult  Completed   Immunization History  Administered Date(s) Administered  . DT 03/22/2015  . Influenza Split 03/04/2013  . Influenza, High Dose Seasonal PF 03/16/2014, 03/22/2015, 02/15/2016  . Pneumococcal Polysaccharide-23 03/16/2014   Past Surgical History:  Procedure Laterality Date  . CATARACT EXTRACTION    . MACULAR TEAR    . Vericose vein  1960   Family History  Problem Relation Age of Onset  . Heart disease Mother   .  Heart disease Father   . Colon polyps Sister   . Diabetes Sister   . Heart disease Brother    Social History  Substance Use Topics  . Smoking status: Former Smoker    Packs/day: 1.00    Years: 15.00    Quit date: 06/11/1965  . Smokeless tobacco: Never Used  . Alcohol use No    ROS Constitutional: Denies fever, chills, weight loss/gain, headaches, insomnia,  night sweats, and change in appetite. Does c/o fatigue. Eyes: Denies redness, blurred vision, diplopia, discharge, itchy, watery eyes.  ENT: Denies discharge, congestion, post nasal drip, epistaxis, sore throat, earache, hearing loss, dental pain, Tinnitus, Vertigo, Sinus pain, snoring.  Cardio: Denies chest pain, palpitations, irregular heartbeat, syncope, dyspnea, diaphoresis, orthopnea, PND, claudication,  edema Respiratory: denies cough, dyspnea, DOE, pleurisy, hoarseness, laryngitis, wheezing.  Gastrointestinal: Denies dysphagia, heartburn, reflux, water brash, pain, cramps, nausea, vomiting, bloating, diarrhea, constipation, hematemesis, melena, hematochezia, jaundice, hemorrhoids Genitourinary: Denies dysuria, frequency, urgency, nocturia, hesitancy, discharge, hematuria, flank pain Breast: Breast lumps, nipple discharge, bleeding.  Musculoskeletal: Denies arthralgia, myalgia, stiffness, Jt. Swelling, pain, limp, and strain/sprain. Denies falls. Skin: Denies puritis, rash, hives, warts, acne, eczema, changing in skin lesion Neuro: No weakness, tremor, incoordination, spasms, paresthesia, pain Psychiatric: Denies confusion, memory loss, sensory loss. Denies Depression. Endocrine: Denies change in weight, skin, hair change, nocturia, and paresthesia, diabetic polys, visual blurring, hyper / hypo glycemic episodes.  Heme/Lymph: No excessive bleeding, bruising, enlarged lymph nodes.  Physical Exam  BP 132/76   Pulse 60   Temp 97.2 F (36.2 C)   Resp 16   Ht 5' 2.5" (1.588 m)   Wt 163 lb 3.2 oz (74 kg)   BMI 29.37 kg/m   General Appearance: Well nourished, well groomed and in no apparent distress.  Eyes: PERRLA, EOMs, conjunctiva no swelling or erythema, normal fundi and vessels. Sinuses: No frontal/maxillary tenderness ENT/Mouth: EACs patent / TMs  nl. Nares clear without erythema, swelling, mucoid exudates. Oral hygiene is good. No erythema, swelling, or exudate. Tongue normal, non-obstructing. Tonsils not swollen or erythematous. Hearing normal.  Neck: Supple, thyroid normal. No bruits, nodes or JVD. Respiratory: Respiratory effort normal.  BS equal and clear bilateral without rales, rhonci, wheezing or stridor. Cardio: Heart sounds are normal with regular rate and rhythm and no murmurs, rubs or gallops. Peripheral pulses are normal and equal bilaterally without edema. No aortic or  femoral bruits. Chest: symmetric with normal excursions and percussion. Breasts: Symmetric, without lumps, nipple discharge, retractions, or fibrocystic changes.  Abdomen: Flat, soft with bowel sounds active. Nontender, no guarding, rebound, hernias, masses, or organomegaly.  Lymphatics: Non tender without lymphadenopathy.  Musculoskeletal: Full ROM all peripheral extremities, joint stability, 5/5 strength, and normal gait. Skin: Warm and dry without rashes, lesions, cyanosis, clubbing or  ecchymosis.  Neuro: Cranial nerves intact, reflexes equal bilaterally. Normal muscle tone, no cerebellar symptoms. Sensation intact.  Pysch: Alert and oriented X 3, normal affect, Insight and Judgment appropriate.   Assessment and Plan  1. Annual Preventative Screening Examination   2. Essential hypertension  - Microalbumin / creatinine urine ratio - EKG 12-Lead - Urinalysis, Routine w reflex microscopic - CBC with Differential/Platelet - BASIC METABOLIC PANEL WITH GFR - Magnesium - TSH  3. Mixed hyperlipidemia  - EKG 12-Lead - Hepatic function panel - Lipid panel - TSH  4. Prediabetes  - EKG 12-Lead - Hemoglobin A1c - Insulin, random  5. Vitamin D deficiency  - VITAMIN D 25 Hydroxy   6. CKD Stage III (GFR 56  ml/min)  - Microalbumin / creatinine urine ratio - Urinalysis, Routine w reflex microscopic  7. Screening for rectal cancer  - POC Hemoccult Bld/Stl   8. Screening for ischemic heart disease  - EKG 12-Lead  9. Medication management  - Urinalysis, Routine w reflex microscopic - CBC with Differential/Platelet - BASIC METABOLIC PANEL WITH GFR - Hepatic function panel - Magnesium - Lipid panel - TSH - Hemoglobin A1c - Insulin, random - VITAMIN D 25 Hydroxy       Patient was counseled in prudent diet to achieve/maintain BMI less than 25 for weight control, BP monitoring, regular exercise and medications. Discussed med's effects and SE's. Screening labs and tests  as requested with regular follow-up as recommended. Over 40 minutes of exam, counseling, chart review and high complex critical decision making was performed.

## 2016-09-18 NOTE — Patient Instructions (Signed)

## 2016-09-19 LAB — URINALYSIS, ROUTINE W REFLEX MICROSCOPIC
Bilirubin Urine: NEGATIVE
Glucose, UA: NEGATIVE
Ketones, ur: NEGATIVE
Nitrite: NEGATIVE
Protein, ur: NEGATIVE
Specific Gravity, Urine: 1.01 (ref 1.001–1.035)
pH: 5.5 (ref 5.0–8.0)

## 2016-09-19 LAB — LIPID PANEL
Cholesterol: 134 mg/dL (ref <200)
HDL: 38 mg/dL — ABNORMAL LOW (ref >50)
LDL Cholesterol: 75 mg/dL (ref <100)
Total CHOL/HDL Ratio: 3.5 Ratio (ref <5.0)
Triglycerides: 105 mg/dL (ref <150)
VLDL: 21 mg/dL (ref <30)

## 2016-09-19 LAB — HEMOGLOBIN A1C
Hgb A1c MFr Bld: 5.7 % — ABNORMAL HIGH (ref <5.7)
Mean Plasma Glucose: 117 mg/dL

## 2016-09-19 LAB — MAGNESIUM: Magnesium: 1.8 mg/dL (ref 1.5–2.5)

## 2016-09-19 LAB — CBC WITH DIFFERENTIAL/PLATELET
Basophils Absolute: 61 cells/uL (ref 0–200)
Basophils Relative: 1 %
Eosinophils Absolute: 122 cells/uL (ref 15–500)
Eosinophils Relative: 2 %
HCT: 34.1 % — ABNORMAL LOW (ref 35.0–45.0)
Hemoglobin: 11.1 g/dL — ABNORMAL LOW (ref 11.7–15.5)
Lymphocytes Relative: 26 %
Lymphs Abs: 1586 cells/uL (ref 850–3900)
MCH: 28.3 pg (ref 27.0–33.0)
MCHC: 32.6 g/dL (ref 32.0–36.0)
MCV: 87 fL (ref 80.0–100.0)
MPV: 11.8 fL (ref 7.5–12.5)
Monocytes Absolute: 427 {cells}/uL (ref 200–950)
Monocytes Relative: 7 %
Neutro Abs: 3904 cells/uL (ref 1500–7800)
Neutrophils Relative %: 64 %
Platelets: 198 10*3/uL (ref 140–400)
RBC: 3.92 MIL/uL (ref 3.80–5.10)
RDW: 14.5 % (ref 11.0–15.0)
WBC: 6.1 10*3/uL (ref 3.8–10.8)

## 2016-09-19 LAB — BASIC METABOLIC PANEL WITHOUT GFR
GFR, Est African American: 53 mL/min — ABNORMAL LOW (ref >=60)
GFR, Est Non African American: 46 mL/min — ABNORMAL LOW (ref >=60)
Glucose, Bld: 90 mg/dL (ref 65–99)
Potassium: 4.2 mmol/L (ref 3.5–5.3)

## 2016-09-19 LAB — BASIC METABOLIC PANEL WITH GFR
BUN: 21 mg/dL (ref 7–25)
CO2: 23 mmol/L (ref 20–31)
Calcium: 8.6 mg/dL (ref 8.6–10.4)
Chloride: 104 mmol/L (ref 98–110)
Creat: 1.11 mg/dL — ABNORMAL HIGH (ref 0.60–0.88)
Sodium: 138 mmol/L (ref 135–146)

## 2016-09-19 LAB — HEPATIC FUNCTION PANEL
ALT: 12 U/L (ref 6–29)
AST: 22 U/L (ref 10–35)
Albumin: 3.8 g/dL (ref 3.6–5.1)
Alkaline Phosphatase: 59 U/L (ref 33–130)
Bilirubin, Direct: 0.1 mg/dL (ref <=0.2)
Indirect Bilirubin: 0.2 mg/dL (ref 0.2–1.2)
Total Bilirubin: 0.3 mg/dL (ref 0.2–1.2)
Total Protein: 6.4 g/dL (ref 6.1–8.1)

## 2016-09-19 LAB — URINALYSIS, MICROSCOPIC ONLY
Bacteria, UA: NONE SEEN [HPF]
Casts: NONE SEEN [LPF]
Crystals: NONE SEEN [HPF]
RBC / HPF: NONE SEEN RBC/HPF (ref <=2)
Yeast: NONE SEEN [HPF]

## 2016-09-19 LAB — INSULIN, RANDOM: Insulin: 14.2 u[IU]/mL (ref 2.0–19.6)

## 2016-09-19 LAB — TSH: TSH: 2.68 m[IU]/L

## 2016-09-19 LAB — VITAMIN D 25 HYDROXY (VIT D DEFICIENCY, FRACTURES): Vit D, 25-Hydroxy: 49 ng/mL (ref 30–100)

## 2016-09-19 LAB — MICROALBUMIN / CREATININE URINE RATIO
Creatinine, Urine: 59 mg/dL (ref 20–320)
Microalb Creat Ratio: 12 mcg/mg creat (ref <30)
Microalb, Ur: 0.7 mg/dL

## 2016-09-27 ENCOUNTER — Encounter: Payer: Self-pay | Admitting: Internal Medicine

## 2016-10-09 ENCOUNTER — Encounter: Payer: Self-pay | Admitting: Physician Assistant

## 2016-10-09 ENCOUNTER — Ambulatory Visit (INDEPENDENT_AMBULATORY_CARE_PROVIDER_SITE_OTHER): Payer: Medicare HMO | Admitting: Physician Assistant

## 2016-10-09 VITALS — BP 128/72 | HR 77 | Temp 97.7°F | Resp 16 | Ht 62.5 in | Wt 166.8 lb

## 2016-10-09 DIAGNOSIS — J0101 Acute recurrent maxillary sinusitis: Secondary | ICD-10-CM | POA: Diagnosis not present

## 2016-10-09 MED ORDER — AZITHROMYCIN 250 MG PO TABS: ORAL_TABLET | ORAL | 1 refills | Status: AC

## 2016-10-09 MED ORDER — FLUTICASONE PROPIONATE 50 MCG/ACT NA SUSP: 2.0000 | Freq: Every day | NASAL | 1 refills | Status: DC

## 2016-10-09 NOTE — Patient Instructions (Signed)
Your ears and sinuses are connected by the eustachian tube. When your sinuses are inflamed, this can close off the tube and cause fluid to collect in your middle ear. This can then cause dizziness, popping, clicking, ringing, and echoing in your ears. This is often NOT an infection and does NOT require antibiotics, it is caused by inflammation so the treatments help the inflammation. This can take a long time to get better so please be patient.  Here are things you can do to help with this: - Try the Flonase or Nasonex. Remember to spray each nostril twice towards the outer part of your eye.  Do not sniff but instead pinch your nose and tilt your head back to help the medicine get into your sinuses.  The best time to do this is at bedtime.Stop if you get blurred vision or nose bleeds.  -While drinking fluids, pinch and hold nose close and swallow, to help open eustachian tubes to drain fluid behind ear drums. -Please pick one of the over the counter allergy medications below and take it once daily for allergies.  It will also help with fluid behind ear drums. Claritin or loratadine cheapest but likely the weakest  Zyrtec or certizine at night because it can make you sleepy The strongest is allegra or fexafinadine  Cheapest at [employer], [employer], [employer] -can use decongestant over the counter, please do not use if you have high blood pressure or certain heart conditions.   if worsening HA, changes vision/speech, imbalance, weakness go to the ER   Sinus Headache A sinus headache occurs when the paranasal sinuses become clogged or swollen. Paranasal sinuses are air pockets within the bones of the face. Sinus headaches can range from mild to severe. What are the causes? A sinus headache can result from various conditions that affect the sinuses, such as:  Colds.  Sinus infections.  Allergies. What are the signs or symptoms? The main symptom of this condition is a headache that may feel like pain or  pressure in the face, forehead, ears, or upper teeth. People who have a sinus headache often have other symptoms, such as:  Congested or runny nose.  Fever.  Inability to smell. Weather changes can make symptoms worse. How is this diagnosed? This condition may be diagnosed based on:  A physical exam and medical history.  Imaging tests, such as a CT scan and MRI, to check for problems with the sinuses.  A specialist may look into the sinuses with a tool that has a camera (endoscopy). How is this treated? Treatment for this condition depends on the cause.  Sinus pain that is caused by a sinus infection may be treated with antibiotic medicine.  Sinus pain that is caused by allergies may be helped by allergy medicines (antihistamines) and medicated nasal sprays.  Sinus pain that is caused by congestion may be helped by flushing the nose and sinuses with saline solution. Follow these instructions at home:  Take medicines only as directed by your health care provider.  If you were prescribed an antibiotic medicine, finish all of it even if you start to feel better.  If you have congestion, use a nasal spray to help reduce pressure.  If directed, apply a warm, moist washcloth to your face to help relieve pain. Contact a health care provider if:  You have headaches more than one time each week.  You have sensitivity to light or sound.  You have a fever.  You feel sick to your stomach (  nauseous) or you throw up (vomit).  Your headaches do not get better with treatment. Many people think that they have a sinus headache when they actually have migraines or tension headaches. Get help right away if:  You have vision problems.  You have sudden, severe pain in your face or head.  You have a seizure.  You are confused.  You have a stiff neck. This information is not intended to replace advice given to you by your health care provider. Make sure you discuss any questions you  have with your health care provider. Document Released: 07/05/2004 Document Revised: 01/22/2016 Document Reviewed: 05/24/2014 Elsevier Interactive Patient Education  2017 [employer].

## 2016-10-09 NOTE — Progress Notes (Signed)
Subjective:    Patient ID: Mikayla Harrison, female    DOB: 1932-06-08, 83 y.o.   MRN: [id number]  HPI 83 y.o. WF presents with HA and sinus x 1 week.  Bilateral headache over eyes, temples, and back of ead, feels that her left face swollen, she has had congestion. Has been taking Claritin that helped some. Has had cough, no fever, chills, SOB, CP. No changes in her vision, no weakness, changes in speech, no nausea.   Blood pressure 128/72, pulse 77, temperature 97.7 F (36.5 C), resp. rate 16, height 5' 2.5" (1.588 m), weight 166 lb 12.8 oz (75.7 kg), SpO2 98 %.  Medications Current Outpatient Prescriptions on File Prior to Visit  Medication Sig  . aspirin 81 MG tablet Take 81 mg by mouth daily.    Marland Kitchen atenolol (TENORMIN) 50 MG tablet Take 1 tablet daily for BP and palpitations  . benazepril (LOTENSIN) 40 MG tablet TAKE 1 TABLET EVERY DAY  . clorazepate (TRANXENE-T) 7.5 MG tablet Take 1/2 to 1 tablet 2 to 3 x / day if needed for anxiety  . levothyroxine (SYNTHROID, LEVOTHROID) 150 MCG tablet TAKE 1 TABLET EVERY DAY  . meclizine (ANTIVERT) 25 MG tablet 1/2-1 pill up to 3 times daily for motion sickness/dizziness   No current facility-administered medications on file prior to visit.     Problem list She has Hypertension; Hypothyroidism; Prediabetes; Diverticulosis; Hyperlipidemia; Medication management; Vitamin D deficiency; CKD Stage III (GFR 56 ml/min); Unifocal PVCs; Morbid obesity ([place])  (BMI 30.41) ; Raynaud phenomenon; and Encounter for Medicare annual wellness exam on her problem list.  Review of Systems  Constitutional: Negative for chills and diaphoresis.  HENT: Positive for congestion, postnasal drip, sinus pressure and sneezing. Negative for ear pain and sore throat.   Respiratory: Positive for cough. Negative for chest tightness, shortness of breath and wheezing.   Cardiovascular: Negative.   Gastrointestinal: Negative.   Genitourinary: Negative.   Musculoskeletal: Negative  for neck pain.  Neurological: Positive for headaches.       Objective:   Physical Exam  Constitutional: She is oriented to person, place, and time. She appears well-developed and well-nourished.  HENT:  Head: Normocephalic and atraumatic.  Right Ear: External ear normal.  Nose: Right sinus exhibits maxillary sinus tenderness. Right sinus exhibits no frontal sinus tenderness. Left sinus exhibits maxillary sinus tenderness. Left sinus exhibits no frontal sinus tenderness.  No temple tenderness to palpation  Eyes: Conjunctivae and EOM are normal.  Neck: Normal range of motion. Neck supple.  Cardiovascular: Normal rate, regular rhythm, normal heart sounds and intact distal pulses.   Pulmonary/Chest: Effort normal and breath sounds normal. No respiratory distress. She has no wheezes.  Abdominal: Soft. Bowel sounds are normal.  Lymphadenopathy:    She has cervical adenopathy.  Neurological: She is alert and oriented to person, place, and time. No cranial nerve deficit.  Normal neuro  Skin: Skin is warm and dry.       Assessment & Plan:  1. Acute recurrent maxillary sinusitis Will hold the zpak and take if she is not getting better, increase fluids, rest, cont allergy pill - fluticasone (FLONASE) 50 MCG/ACT nasal spray; Place 2 sprays into both nostrils at bedtime.  Dispense: 16 g; Refill: 1 - azithromycin (ZITHROMAX) 250 MG tablet; Take 2 tablets (500 mg) on  Day 1,  followed by 1 tablet (250 mg) once daily on Days 2 through 5.  Dispense: 6 each; Refill: 1  The patient was advised to call immediately  if she has any concerning symptoms in the interval. The patient voices understanding of current treatment options and is in agreement with the current care plan.The patient knows to call the clinic with any problems, questions or concerns or go to the ER if any further progression of symptoms.

## 2016-10-22 ENCOUNTER — Other Ambulatory Visit: Payer: Self-pay | Admitting: *Deleted

## 2016-10-22 MED ORDER — BENAZEPRIL HCL 40 MG PO TABS: 40.0000 mg | ORAL_TABLET | Freq: Every day | ORAL | 1 refills | Status: DC

## 2016-10-24 DIAGNOSIS — Z6827 Body mass index (BMI) 27.0-27.9, adult: Secondary | ICD-10-CM | POA: Diagnosis not present

## 2016-10-24 DIAGNOSIS — Z7982 Long term (current) use of aspirin: Secondary | ICD-10-CM | POA: Diagnosis not present

## 2016-10-24 DIAGNOSIS — Z0000 Encounter for general adult medical examination without abnormal findings: Secondary | ICD-10-CM | POA: Diagnosis not present

## 2016-10-24 DIAGNOSIS — E039 Hypothyroidism, unspecified: Secondary | ICD-10-CM | POA: Diagnosis not present

## 2016-10-24 DIAGNOSIS — Z79899 Other long term (current) drug therapy: Secondary | ICD-10-CM | POA: Diagnosis not present

## 2016-10-24 DIAGNOSIS — I10 Essential (primary) hypertension: Secondary | ICD-10-CM | POA: Diagnosis not present

## 2016-10-24 DIAGNOSIS — M25562 Pain in left knee: Secondary | ICD-10-CM | POA: Diagnosis not present

## 2016-11-06 ENCOUNTER — Encounter (HOSPITAL_COMMUNITY): Payer: Self-pay | Admitting: Emergency Medicine

## 2016-11-06 ENCOUNTER — Observation Stay (HOSPITAL_COMMUNITY)
Admission: EM | Admit: 2016-11-06 | Discharge: 2016-11-07 | Disposition: A | Discharge status: Home / Self Care | Payer: Medicare HMO | Attending: Internal Medicine | Admitting: Internal Medicine

## 2016-11-06 ENCOUNTER — Emergency Department (HOSPITAL_COMMUNITY): Payer: Medicare HMO

## 2016-11-06 DIAGNOSIS — D649 Anemia, unspecified: Secondary | ICD-10-CM | POA: Insufficient documentation

## 2016-11-06 DIAGNOSIS — Z833 Family history of diabetes mellitus: Secondary | ICD-10-CM | POA: Diagnosis not present

## 2016-11-06 DIAGNOSIS — I129 Hypertensive chronic kidney disease with stage 1 through stage 4 chronic kidney disease, or unspecified chronic kidney disease: Secondary | ICD-10-CM | POA: Insufficient documentation

## 2016-11-06 DIAGNOSIS — Z87891 Personal history of nicotine dependence: Secondary | ICD-10-CM | POA: Diagnosis not present

## 2016-11-06 DIAGNOSIS — R7303 Prediabetes: Secondary | ICD-10-CM

## 2016-11-06 DIAGNOSIS — N183 Chronic kidney disease, stage 3 unspecified: Secondary | ICD-10-CM | POA: Diagnosis present

## 2016-11-06 DIAGNOSIS — G459 Transient cerebral ischemic attack, unspecified: Secondary | ICD-10-CM | POA: Diagnosis not present

## 2016-11-06 DIAGNOSIS — I6789 Other cerebrovascular disease: Secondary | ICD-10-CM | POA: Diagnosis not present

## 2016-11-06 DIAGNOSIS — Z888 Allergy status to other drugs, medicaments and biological substances status: Secondary | ICD-10-CM | POA: Diagnosis not present

## 2016-11-06 DIAGNOSIS — Z823 Family history of stroke: Secondary | ICD-10-CM | POA: Insufficient documentation

## 2016-11-06 DIAGNOSIS — I7300 Raynaud's syndrome without gangrene: Secondary | ICD-10-CM | POA: Diagnosis not present

## 2016-11-06 DIAGNOSIS — I10 Essential (primary) hypertension: Secondary | ICD-10-CM | POA: Diagnosis not present

## 2016-11-06 DIAGNOSIS — E039 Hypothyroidism, unspecified: Secondary | ICD-10-CM | POA: Diagnosis not present

## 2016-11-06 DIAGNOSIS — E663 Overweight: Secondary | ICD-10-CM | POA: Diagnosis present

## 2016-11-06 DIAGNOSIS — Z6826 Body mass index (BMI) 26.0-26.9, adult: Secondary | ICD-10-CM | POA: Insufficient documentation

## 2016-11-06 DIAGNOSIS — E038 Other specified hypothyroidism: Secondary | ICD-10-CM

## 2016-11-06 DIAGNOSIS — Z8371 Family history of colonic polyps: Secondary | ICD-10-CM | POA: Diagnosis not present

## 2016-11-06 DIAGNOSIS — Z886 Allergy status to analgesic agent status: Secondary | ICD-10-CM | POA: Insufficient documentation

## 2016-11-06 DIAGNOSIS — Z86010 Personal history of colonic polyps: Secondary | ICD-10-CM | POA: Insufficient documentation

## 2016-11-06 DIAGNOSIS — Z882 Allergy status to sulfonamides status: Secondary | ICD-10-CM | POA: Diagnosis not present

## 2016-11-06 DIAGNOSIS — E782 Mixed hyperlipidemia: Secondary | ICD-10-CM

## 2016-11-06 DIAGNOSIS — E559 Vitamin D deficiency, unspecified: Secondary | ICD-10-CM | POA: Diagnosis not present

## 2016-11-06 DIAGNOSIS — Z7982 Long term (current) use of aspirin: Secondary | ICD-10-CM | POA: Diagnosis not present

## 2016-11-06 DIAGNOSIS — E785 Hyperlipidemia, unspecified: Secondary | ICD-10-CM | POA: Diagnosis not present

## 2016-11-06 DIAGNOSIS — Z8249 Family history of ischemic heart disease and other diseases of the circulatory system: Secondary | ICD-10-CM | POA: Diagnosis not present

## 2016-11-06 DIAGNOSIS — Z9841 Cataract extraction status, right eye: Secondary | ICD-10-CM | POA: Diagnosis not present

## 2016-11-06 DIAGNOSIS — Z885 Allergy status to narcotic agent status: Secondary | ICD-10-CM | POA: Insufficient documentation

## 2016-11-06 DIAGNOSIS — I493 Ventricular premature depolarization: Secondary | ICD-10-CM | POA: Insufficient documentation

## 2016-11-06 DIAGNOSIS — Z8673 Personal history of transient ischemic attack (TIA), and cerebral infarction without residual deficits: Secondary | ICD-10-CM

## 2016-11-06 DIAGNOSIS — R200 Anesthesia of skin: Secondary | ICD-10-CM | POA: Diagnosis not present

## 2016-11-06 HISTORY — DX: Chronic kidney disease, stage 3 unspecified: N18.30

## 2016-11-06 HISTORY — DX: Chronic kidney disease, stage 3 (moderate): N18.3

## 2016-11-06 LAB — URINALYSIS, ROUTINE W REFLEX MICROSCOPIC
Bacteria, UA: NONE SEEN
Bilirubin Urine: NEGATIVE
Glucose, UA: NEGATIVE mg/dL
Ketones, ur: NEGATIVE mg/dL
Leukocytes, UA: NEGATIVE
Nitrite: NEGATIVE
Protein, ur: NEGATIVE mg/dL
Specific Gravity, Urine: 1.005 (ref 1.005–1.030)
pH: 7 (ref 5.0–8.0)

## 2016-11-06 LAB — CBC
HCT: 35.2 % — ABNORMAL LOW (ref 36.0–46.0)
Hemoglobin: 11.1 g/dL — ABNORMAL LOW (ref 12.0–15.0)
MCH: 28 pg (ref 26.0–34.0)
MCHC: 31.5 g/dL (ref 30.0–36.0)
MCV: 88.7 fL (ref 78.0–100.0)
Platelets: 209 10*3/uL (ref 150–400)
RBC: 3.97 MIL/uL (ref 3.87–5.11)
RDW: 13.6 % (ref 11.5–15.5)
WBC: 7.9 10*3/uL (ref 4.0–10.5)

## 2016-11-06 LAB — DIFFERENTIAL
Basophils Absolute: 0 10*3/uL (ref 0.0–0.1)
Basophils Relative: 0 %
Eosinophils Absolute: 0.1 10*3/uL (ref 0.0–0.7)
Eosinophils Relative: 2 %
Lymphocytes Relative: 16 %
Lymphs Abs: 1.2 10*3/uL (ref 0.7–4.0)
Monocytes Absolute: 0.6 10*3/uL (ref 0.1–1.0)
Monocytes Relative: 8 %
Neutro Abs: 5.9 10*3/uL (ref 1.7–7.7)
Neutrophils Relative %: 74 %

## 2016-11-06 LAB — COMPREHENSIVE METABOLIC PANEL WITH GFR
AST: 25 U/L (ref 15–41)
Albumin: 3.6 g/dL (ref 3.5–5.0)
Alkaline Phosphatase: 67 U/L (ref 38–126)
Anion gap: 6 (ref 5–15)
BUN: 13 mg/dL (ref 6–20)
Calcium: 8.9 mg/dL (ref 8.9–10.3)
GFR calc non Af Amer: 49 mL/min — ABNORMAL LOW (ref >60)
Glucose, Bld: 105 mg/dL — ABNORMAL HIGH (ref 65–99)
Total Bilirubin: 0.5 mg/dL (ref 0.3–1.2)
Total Protein: 6.6 g/dL (ref 6.5–8.1)

## 2016-11-06 LAB — I-STAT CHEM 8, ED
BUN: 15 mg/dL (ref 6–20)
Calcium, Ion: 1.18 mmol/L (ref 1.15–1.40)
Chloride: 101 mmol/L (ref 101–111)
Creatinine, Ser: 1 mg/dL (ref 0.44–1.00)
Glucose, Bld: 99 mg/dL (ref 65–99)
HCT: 34 % — ABNORMAL LOW (ref 36.0–46.0)
Hemoglobin: 11.6 g/dL — ABNORMAL LOW (ref 12.0–15.0)
Potassium: 4.3 mmol/L (ref 3.5–5.1)
Sodium: 139 mmol/L (ref 135–145)
TCO2: 21 mmol/L (ref 0–100)

## 2016-11-06 LAB — COMPREHENSIVE METABOLIC PANEL
ALT: 15 U/L (ref 14–54)
CO2: 27 mmol/L (ref 22–32)
Chloride: 104 mmol/L (ref 101–111)
Creatinine, Ser: 1.03 mg/dL — ABNORMAL HIGH (ref 0.44–1.00)
GFR calc Af Amer: 57 mL/min — ABNORMAL LOW (ref >60)
Potassium: 4.4 mmol/L (ref 3.5–5.1)
Sodium: 137 mmol/L (ref 135–145)

## 2016-11-06 LAB — I-STAT TROPONIN, ED: Troponin i, poc: 0.01 ng/mL (ref 0.00–0.08)

## 2016-11-06 LAB — CBG MONITORING, ED: Glucose-Capillary: 89 mg/dL (ref 65–99)

## 2016-11-06 LAB — PROTIME-INR
INR: 1.03
Prothrombin Time: 13.5 seconds (ref 11.4–15.2)

## 2016-11-06 LAB — APTT: aPTT: 27 seconds (ref 24–36)

## 2016-11-06 MED ORDER — ONDANSETRON HCL 4 MG/2ML IJ SOLN: 4.0000 mg | Freq: Four times a day (QID) | INTRAMUSCULAR | Status: DC | PRN

## 2016-11-06 MED ORDER — BENAZEPRIL HCL 40 MG PO TABS
40.0000 mg | ORAL_TABLET | Freq: Every day | ORAL | Status: DC
  Administered 2016-11-07: 40 mg via ORAL
  Filled 2016-11-06: qty 1
  Filled 2016-11-06: qty 2

## 2016-11-06 MED ORDER — CLORAZEPATE DIPOTASSIUM 3.75 MG PO TABS: 3.7500 mg | ORAL_TABLET | Freq: Two times a day (BID) | ORAL | Status: DC | PRN

## 2016-11-06 MED ORDER — LORAZEPAM 2 MG/ML IJ SOLN
1.0000 mg | Freq: Once | INTRAMUSCULAR | Status: AC | PRN
  Administered 2016-11-07: 2 mg via INTRAVENOUS
  Filled 2016-11-06 (×2): qty 1

## 2016-11-06 MED ORDER — ACETAMINOPHEN 650 MG RE SUPP: 650.0000 mg | Freq: Four times a day (QID) | RECTAL | Status: DC | PRN

## 2016-11-06 MED ORDER — STROKE: EARLY STAGES OF RECOVERY BOOK
Freq: Once | Status: DC
  Filled 2016-11-06: qty 1

## 2016-11-06 MED ORDER — ONDANSETRON HCL 4 MG PO TABS: 4.0000 mg | ORAL_TABLET | Freq: Four times a day (QID) | ORAL | Status: DC | PRN

## 2016-11-06 MED ORDER — HYDRALAZINE HCL 20 MG/ML IJ SOLN: 5.0000 mg | Freq: Three times a day (TID) | INTRAMUSCULAR | Status: DC | PRN

## 2016-11-06 MED ORDER — ATENOLOL 25 MG PO TABS
25.0000 mg | ORAL_TABLET | Freq: Every day | ORAL | Status: DC
  Filled 2016-11-06: qty 1

## 2016-11-06 MED ORDER — ASPIRIN EC 81 MG PO TBEC
81.0000 mg | DELAYED_RELEASE_TABLET | Freq: Every day | ORAL | Status: DC
  Administered 2016-11-07: 81 mg via ORAL
  Filled 2016-11-06: qty 1

## 2016-11-06 MED ORDER — LEVOTHYROXINE SODIUM 150 MCG PO TABS
150.0000 ug | ORAL_TABLET | Freq: Every day | ORAL | Status: DC
Start: 2016-11-07 — End: 2016-11-07
  Administered 2016-11-07: 150 ug via ORAL
  Filled 2016-11-06: qty 2
  Filled 2016-11-06: qty 1

## 2016-11-06 MED ORDER — ACETAMINOPHEN 325 MG PO TABS: 650.0000 mg | ORAL_TABLET | Freq: Four times a day (QID) | ORAL | Status: DC | PRN

## 2016-11-06 MED ORDER — SENNOSIDES-DOCUSATE SODIUM 8.6-50 MG PO TABS: 1.0000 | ORAL_TABLET | Freq: Every evening | ORAL | Status: DC | PRN

## 2016-11-06 MED ORDER — HEPARIN SODIUM (PORCINE) 5000 UNIT/ML IJ SOLN
5000.0000 [IU] | Freq: Three times a day (TID) | INTRAMUSCULAR | Status: DC
  Administered 2016-11-06 – 2016-11-07 (×2): 5000 [IU] via SUBCUTANEOUS
  Filled 2016-11-06 (×2): qty 1

## 2016-11-06 MED ORDER — BISACODYL 10 MG RE SUPP: 10.0000 mg | Freq: Every day | RECTAL | Status: DC | PRN

## 2016-11-06 NOTE — ED Notes (Signed)
Patient transported to CT 

## 2016-11-06 NOTE — H&P (Signed)
History and Physical    Mikayla Harrison UKG:[id number] DOB: Sep 05, 1931 DOA: 11/06/2016   PCP: [name], MD   Patient coming from:  Home    Chief Complaint: R sided and R facial numbness   HPI: Mikayla Harrison is a 83 y.o. female with medical history significant for HTN, HLD, CKD, hypothyroidism, prediabetes, presenting to the ED with one episode of right sided numbness and right facial numbness beginning around 1030 this morning, and resolving within 15 minutes. The patient failed to be having a stroke, and therefore calling 911. She reports having occasional similar episodes in the recent past, but she never reported. She reported feeling of nervousness, nausea, without vomiting. She denies any vertigo.  Denies any blurred or double vision ,She denies any fever, chills night sweats, recent infections, sick contacts, recent long distance travel. She denies any new medications.Denies taking  hormonal supplements.  She denies any unilateral weakness. She denies any dysphagia or dysarthria. She denies any shortness of breath and cough. She denies any chest pain or palpitations. She is symptom free at this time. Risk  factors for stroke include CVA in mother and brother, hypertension, hyperlipidemia, and prediabetes, age .  ED Course:  BP 137/63   Pulse (!) 50   Temp 98.4 F (36.9 C) (Oral)   Resp 11   Ht 5\' 5"  (1.651 m)   Wt 72.6 kg (160 lb)   SpO2 100%   BMI 26.63 kg/m    CT head unremarkable sodium 139 potassium 4.3 glucose 99 creatinine 1.0 troponin 0.01 white count 7.9 hemoglobin 11.6 hematocrit 34 PT 13.5 INR 1.03 PTT 27 last TSH performed April 2018 was 2.68 EKG with SR   Review of Systems:  As per HPI otherwise all other systems reviewed and are negative  Past Medical History:  Diagnosis Date  . Anemia   . Colon polyp   . Diverticulosis   . Hyperlipidemia   . Hypertension   . Hypothyroidism   . Iron deficiency   . Prediabetes   . Small bowel obstruction [facility])      Past Surgical History:  Procedure Laterality Date  . CATARACT EXTRACTION    . MACULAR TEAR    . Vericose vein  1960    Social History Social History   Social History  . Marital status: Widowed    Spouse name: N/A  . Number of children: N/A  . Years of education: N/A   Occupational History  . Not on file.   Social History Main Topics  . Smoking status: Former Smoker    Packs/day: 1.00    Years: 15.00    Quit date: 06/11/1965  . Smokeless tobacco: Never Used  . Alcohol use No  . Drug use: No  . Sexual activity: Not on file   Other Topics Concern  . Not on file   Social History Narrative  . No narrative on file     Allergies  Allergen Reactions  . Sulfa Antibiotics Other (See Comments)  . Asa [Aspirin] Other (See Comments)    Take 81mg  cannot take 325mg  gets nervous  . Codeine Nausea Only  . Prednisone Nausea And Vomiting    Family History  Problem Relation Age of Onset  . Heart disease Mother   . Stroke Mother   . Heart disease Father   . Colon polyps Sister   . Diabetes Sister   . Stroke Sister   . Heart disease Brother   . Stroke Brother  Prior to Admission medications   Medication Sig Start Date End Date Taking? Authorizing Provider  acetaminophen (TYLENOL) 325 MG tablet Take 325 mg by mouth every 6 (six) hours as needed.   Yes [provider]  aspirin 81 MG tablet Take 81 mg by mouth daily.     Yes [provider]  benazepril (LOTENSIN) 40 MG tablet Take 1 tablet (40 mg total) by mouth daily. 10/22/16  Yes [name], MD  Cholecalciferol (VITAMIN D3) 1000 units CAPS Take 1,000 Units by mouth 2 (two) times daily.   Yes [provider]  clorazepate (TRANXENE-T) 7.5 MG tablet Take 1/2 to 1 tablet 2 to 3 x / day if needed for anxiety 07/11/16 02/08/17 Yes [name], MD  levothyroxine (SYNTHROID, LEVOTHROID) 150 MCG tablet TAKE 1 TABLET EVERY DAY 04/23/16  Yes Vicie Mutters, PA-C  Magnesium 100 MG CAPS  Take 1 mg by mouth every morning.   Yes [provider]  meclizine (ANTIVERT) 25 MG tablet 1/2-1 pill up to 3 times daily for motion sickness/dizziness 02/20/16  Yes Vicie Mutters, PA-C  atenolol (TENORMIN) 50 MG tablet Take 1 tablet daily for BP and palpitations 07/18/16 01/15/17  [name], MD  fluticasone [facility]) 50 MCG/ACT nasal spray Place 2 sprays into both nostrils at bedtime. Patient not taking: Reported on 11/06/2016 10/09/16   Vicie Mutters, PA-C    Physical Exam:  Vitals:   11/06/16 1226 11/06/16 1230 11/06/16 1245 11/06/16 1300  BP: 133/66 (!) 142/66 (!) 149/67 137/63  Pulse:  (!) 54 (!) 51 (!) 50  Resp: (!) 21 (!) 21 17 11   Temp:      TempSrc:      SpO2:  100% 100% 100%  Weight:      Height:       Constitutional: NAD, anxious appearing Eyes: PERRL, lids and conjunctivae normal ENMT: Mucous membranes are moist, without exudate or lesions  Neck: normal, supple, no masses, no thyromegaly Respiratory: clear to auscultation bilaterally, no wheezing, no crackles. Normal respiratory effort  Cardiovascular: Regular rate and rhythm, no murmurs, rubs or gallops. No extremity edema. 2+ pedal pulses. No carotid bruits.  Abdomen: Soft, non tender, No hepatosplenomegaly. Bowel sounds positive.  Musculoskeletal: no clubbing / cyanosis. Moves all extremities Skin: no jaundice, No lesions.  Neurologic: Sensation intact  Strength equal in all extremities. Unremarkable exam  Psychiatric:   Alert and oriented x 3. Anxious mood.     Labs on Admission: I have personally reviewed following labs and imaging studies  CBC:  Recent Labs Lab 11/06/16 1149 11/06/16 1242  WBC 7.9  --   NEUTROABS 5.9  --   HGB 11.1* 11.6*  HCT 35.2* 34.0*  MCV 88.7  --   PLT 209  --     Basic Metabolic Panel:  Recent Labs Lab 11/06/16 1149 11/06/16 1242  NA 137 139  K 4.4 4.3  CL 104 101  CO2 27  --   GLUCOSE 105* 99  BUN 13 15  CREATININE 1.03* 1.00  CALCIUM 8.9  --      GFR: Estimated Creatinine Clearance: 43.3 mL/min (by C-G formula based on SCr of 1 mg/dL).  Liver Function Tests:  Recent Labs Lab 11/06/16 1149  AST 25  ALT 15  ALKPHOS 67  BILITOT 0.5  PROT 6.6  ALBUMIN 3.6   No results for input(s): LIPASE, AMYLASE in the last 168 hours. No results for input(s): AMMONIA in the last 168 hours.  Coagulation Profile:  Recent Labs Lab 11/06/16 1149  INR 1.03    Cardiac Enzymes: No results for input(s): CKTOTAL, CKMB, CKMBINDEX, TROPONINI in the last 168 hours.  BNP (last 3 results) No results for input(s): PROBNP in the last 8760 hours.  HbA1C: No results for input(s): HGBA1C in the last 72 hours.  CBG:  Recent Labs Lab 11/06/16 1257  GLUCAP 89    Lipid Profile: No results for input(s): CHOL, HDL, LDLCALC, TRIG, CHOLHDL, LDLDIRECT in the last 72 hours.  Thyroid Function Tests: No results for input(s): TSH, T4TOTAL, FREET4, T3FREE, THYROIDAB in the last 72 hours.  Anemia Panel: No results for input(s): VITAMINB12, FOLATE, FERRITIN, TIBC, IRON, RETICCTPCT in the last 72 hours.  Urine analysis:    Component Value Date/Time   COLORURINE YELLOW 09/18/2016 1711   APPEARANCEUR CLEAR 09/18/2016 1711   LABSPEC 1.010 09/18/2016 1711   PHURINE 5.5 09/18/2016 1711   GLUCOSEU NEGATIVE 09/18/2016 1711   HGBUR TRACE (A) 09/18/2016 1711   BILIRUBINUR NEGATIVE 09/18/2016 1711   KETONESUR NEGATIVE 09/18/2016 1711   PROTEINUR NEGATIVE 09/18/2016 1711   UROBILINOGEN 0.2 01/13/2012 2049   NITRITE NEGATIVE 09/18/2016 1711   LEUKOCYTESUR 2+ (A) 09/18/2016 1711    Sepsis Labs: @LABRCNTIP (procalcitonin:4,lacticidven:4) )No results found for this or any previous visit (from the past 240 hour(s)).   Radiological Exams on Admission: Ct Head Wo Contrast  Result Date: 11/06/2016 CLINICAL DATA:  Right-sided facial numbness, now resolved. EXAM: CT HEAD WITHOUT CONTRAST TECHNIQUE: Contiguous axial images were obtained from the base of  the skull through the vertex without intravenous contrast. COMPARISON:  None. FINDINGS: Brain: There is no evidence of acute infarct, intracranial hemorrhage, mass, midline shift, or extra-axial fluid collection. The ventricles and sulci are within normal limits for age. Deep cerebral white matter hypodensities are nonspecific but compatible with mild chronic small vessel ischemic disease. There is a suspected lacunar infarct in the right lentiform nucleus and a dilated perivascular space versus lacunar infarct inferiorly in the left lentiform nucleus/external capsule, both of which are age indeterminate though potentially chronic. Vascular: Calcified atherosclerosis at the skullbase. No hyperdense vessel. Skull: No fracture focal osseous lesion. Sinuses/Orbits: Mild left maxillary sinus mucosal thickening. Clear mastoid air cells. Right cataract extraction. Other: None. IMPRESSION: 1. No evidence of acute cortically based infarct or intracranial hemorrhage. 2. Mild chronic small vessel ischemic disease with age indeterminate (though potentially chronic) lacunar infarcts and/or dilated perivascular spaces in the basal ganglia. Electronically Signed   By: Logan Bores M.D.   On: 11/06/2016 12:21    EKG: Independently reviewed.  Assessment/Plan Active Problems:   Hypertension   Hypothyroidism   Prediabetes   Hyperlipidemia   Vitamin D deficiency   CKD Stage III (GFR 56 ml/min)   Morbid obesity ([facility])  (BMI 30.41)    Raynaud phenomenon   TIA (transient ischemic attack)     Right sided numbness and R facial numbness concerning for TIA . Symptoms lasted 15 mins, now resolved  No unilateral weakness  CT head unremarkable . Risk  factors for stroke include CVA in mother and brother, hypertension, hyperlipidemia, and prediabetes, age . Telemetry observation Check MRI/MRA head Carotid dopplers due to CKD  ASA 2D echo Check lipid panel  Appreciate Neuro involvement   Pre Diabetes Current blood sugar  level is 98  A1C 5.7 on 07/2016  Lab Results  Component Value Date   HGBA1C 5.7 (H) 09/18/2016  Monitor CBGs bid  Heart healthy carb modified diet.  Hyperlipidemia Continue home statins  Hypertension BP 142/70   Pulse  8    Controlled Allow permissive hypertension until MRI results become available  Continue home meds after MRI with Lotensin and atenolol  HYdralazine for BP 200/110    Chronic kidney disease stage 3  baseline creatinine 1  , currently at [facility]  Lab Results  Component Value Date   CREATININE 1.00 11/06/2016   CREATININE 1.03 (H) 11/06/2016   CREATININE 1.11 (H) 09/18/2016  Repeat CMET in am  Hypothyroidism: Continue home Synthroid  Anemia, normocytic   Hemoglobin on admission 11, at baseline. No bleeding issues present  Repeat CBC in am  No transfusion is indicated at this time  Anxiety  Continue home Tranxene prn     DVT prophylaxis:  Heparin  Code Status:   Full     Family Communication:  Discussed with patient Disposition Plan: Expect patient to be discharged to home after condition improves Consults called:    Neuro per EDP  Admission status:Tele  Obs   [facility] E, PA-C [facility]   11/06/2016, 2:02 PM

## 2016-11-06 NOTE — ED Notes (Signed)
Attempted report 

## 2016-11-06 NOTE — ED Provider Notes (Signed)
[place] DEPT Provider Note   CSN: [id number] Arrival date & time: 11/06/16  1145     History   Chief Complaint Chief Complaint  Patient presents with  . Numbness    HPI Mikayla Harrison is a 83 y.o. female.  HPI   Mikayla Harrison is a 83 y.o. female, with a history of HTN, hyperlipidemia and anemia, presenting to the ED with an episode of right sided numbness that came on suddenly around 10:30AM today. Accompanied by feeling of nervousness, nausea, and chills. Resolved within about 15 minutes. Concerned she was have a stroke so she called 911. Has had similar symptoms in the past. Denies symptoms currently.  Denies SOB, CP, recent illness, weakness, or any other complaints.    Risk factors for stroke include hypertension, former smoker, dyslipidemia, age, and family history.    Past Medical History:  Diagnosis Date  . Anemia   . CKD (chronic kidney disease) stage 3, GFR 30-59 ml/min   . Colon polyp   . Diverticulosis   . Hyperlipidemia   . Hyperlipidemia   . Hypertension   . Hypothyroidism   . Iron deficiency   . Prediabetes   . Prediabetes   . Small bowel obstruction [facility])     Patient Active Problem List   Diagnosis Date Noted  . TIA (transient ischemic attack) 11/06/2016  . Encounter for Medicare annual wellness exam 09/27/2015  . Raynaud phenomenon 08/01/2015  . Morbid obesity ([place])  (BMI 30.41)  03/22/2015  . 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 CKD Stage III (GFR 56 ml/min) 03/16/2014  . Hyperlipidemia 10/05/2013  . Medication management 10/05/2013  . Vitamin D deficiency 10/05/2013  . Prediabetes   . Diverticulosis   . Hypertension 01/14/2012  . Hypothyroidism 01/14/2012    Past Surgical History:  Procedure Laterality Date  . CATARACT EXTRACTION    . MACULAR TEAR    . Vericose vein  1960    OB History    No data available       Home Medications    Prior to Admission medications   Medication Sig Start Date End Date Taking? Authorizing Provider    acetaminophen (TYLENOL) 325 MG tablet Take 325 mg by mouth every 6 (six) hours as needed.   Yes [provider]  aspirin 81 MG tablet Take 81 mg by mouth daily.     Yes [provider]  benazepril (LOTENSIN) 40 MG tablet Take 1 tablet (40 mg total) by mouth daily. 10/22/16  Yes [name], MD  Cholecalciferol (VITAMIN D3) 1000 units CAPS Take 1,000 Units by mouth 2 (two) times daily.   Yes [provider]  clorazepate (TRANXENE-T) 7.5 MG tablet Take 1/2 to 1 tablet 2 to 3 x / day if needed for anxiety 07/11/16 02/08/17 Yes [name], MD  levothyroxine (SYNTHROID, LEVOTHROID) 150 MCG tablet TAKE 1 TABLET EVERY DAY 04/23/16  Yes Vicie Mutters, PA-C  Magnesium 100 MG CAPS Take 1 mg by mouth every morning.   Yes [provider]  meclizine (ANTIVERT) 25 MG tablet 1/2-1 pill up to 3 times daily for motion sickness/dizziness 02/20/16  Yes Vicie Mutters, PA-C  atenolol (TENORMIN) 50 MG tablet Take 1 tablet daily for BP and palpitations 07/18/16 01/15/17  [name], MD  fluticasone [facility]) 50 MCG/ACT nasal spray Place 2 sprays into both nostrils at bedtime. Patient not taking: Reported on 11/06/2016 10/09/16   Vicie Mutters, PA-C    Family History Family History  Problem Relation Age of Onset  .  Heart disease Mother   . Stroke Mother   . Heart disease Father   . Colon polyps Sister   . Diabetes Sister   . Stroke Sister   . Heart disease Brother   . Stroke Brother     Social History Social History  Substance Use Topics  . Smoking status: Former Smoker    Packs/day: 1.00    Years: 15.00    Quit date: 06/11/1965  . Smokeless tobacco: Never Used  . Alcohol use No     Allergies   Sulfa antibiotics; Asa [aspirin]; Codeine; and Prednisone   Review of Systems Review of Systems  Constitutional: Positive for chills (resolved). Negative for fever.  HENT: Negative for drooling.   Respiratory: Negative for shortness of breath.    Cardiovascular: Negative for chest pain.  Gastrointestinal: Positive for nausea (resolved). Negative for abdominal pain and vomiting.  Neurological: Positive for numbness (resolved). Negative for dizziness, weakness, light-headedness and headaches.  All other systems reviewed and are negative.    Physical Exam Updated Vital Signs BP (!) 156/72   Pulse 77   Temp 98.4 F (36.9 C) (Oral)   Resp (!) 22   Ht 5\' 5"  (1.651 m)   Wt 72.6 kg (160 lb)   SpO2 98%   BMI 26.63 kg/m   Physical Exam  Constitutional: She is oriented to person, place, and time. She appears well-developed and well-nourished. No distress.  HENT:  Head: Normocephalic and atraumatic.  Mouth/Throat: Oropharynx is clear and moist.  Eyes: Conjunctivae and EOM are normal. Pupils are equal, round, and reactive to light.  Neck: Normal range of motion. Neck supple.  Cardiovascular: Normal rate, regular rhythm, normal heart sounds and intact distal pulses.   Pulmonary/Chest: Effort normal and breath sounds normal. No respiratory distress.  Abdominal: Soft. There is no tenderness. There is no guarding.  Musculoskeletal: She exhibits no edema.  Normal motor function intact in all extremities and spine. No midline spinal tenderness.   Lymphadenopathy:    She has no cervical adenopathy.  Neurological: She is alert and oriented to person, place, and time.  No sensory deficits in the extremities or face. Strength 5/5 in all extremities. No arm drift. No gait disturbance. Coordination intact including heel to shin and finger to nose. Cranial nerves III-XII grossly intact. No facial droop.   Skin: Skin is warm and dry. She is not diaphoretic.  Psychiatric: She has a normal mood and affect. Her behavior is normal.  Nursing note and vitals reviewed.    ED Treatments / Results  Labs (all labs ordered are listed, but only abnormal results are displayed) Labs Reviewed  CBC - Abnormal; Notable for the following:       Result  Value   Hemoglobin 11.1 (*)    HCT 35.2 (*)    All other components within normal limits  COMPREHENSIVE METABOLIC PANEL - Abnormal; Notable for the following:    Glucose, Bld 105 (*)    Creatinine, Ser 1.03 (*)    GFR calc non Af Amer 49 (*)    GFR calc Af Amer 57 (*)    All other components within normal limits  I-STAT CHEM 8, ED - Abnormal; Notable for the following:    Hemoglobin 11.6 (*)    HCT 34.0 (*)    All other components within normal limits  PROTIME-INR  APTT  DIFFERENTIAL  URINALYSIS, ROUTINE W REFLEX MICROSCOPIC  I-STAT TROPOININ, ED  CBG MONITORING, ED    EKG  EKG Interpretation  Date/Time:  Tuesday Nov 06 2016 11:57:07 EDT Ventricular Rate:  54 PR Interval:    QRS Duration: 103 QT Interval:  431 QTC Calculation: 409 R Axis:   0 Text Interpretation:  Sinus bradycardia Low voltage, precordial leads Confirmed by Veryl Speak [phone]) on 11/06/2016 12:12:26 PM       Radiology Ct Head Wo Contrast  Result Date: 11/06/2016 CLINICAL DATA:  Right-sided facial numbness, now resolved. EXAM: CT HEAD WITHOUT CONTRAST TECHNIQUE: Contiguous axial images were obtained from the base of the skull through the vertex without intravenous contrast. COMPARISON:  None. FINDINGS: Brain: There is no evidence of acute infarct, intracranial hemorrhage, mass, midline shift, or extra-axial fluid collection. The ventricles and sulci are within normal limits for age. Deep cerebral white matter hypodensities are nonspecific but compatible with mild chronic small vessel ischemic disease. There is a suspected lacunar infarct in the right lentiform nucleus and a dilated perivascular space versus lacunar infarct inferiorly in the left lentiform nucleus/external capsule, both of which are age indeterminate though potentially chronic. Vascular: Calcified atherosclerosis at the skullbase. No hyperdense vessel. Skull: No fracture focal osseous lesion. Sinuses/Orbits: Mild left maxillary sinus mucosal  thickening. Clear mastoid air cells. Right cataract extraction. Other: None. IMPRESSION: 1. No evidence of acute cortically based infarct or intracranial hemorrhage. 2. Mild chronic small vessel ischemic disease with age indeterminate (though potentially chronic) lacunar infarcts and/or dilated perivascular spaces in the basal ganglia. Electronically Signed   By: Logan Bores M.D.   On: 11/06/2016 12:21    Procedures Procedures (including critical care time)  Medications Ordered in ED Medications  benazepril (LOTENSIN) tablet 40 mg (not administered)  atenolol (TENORMIN) tablet 25 mg (not administered)  clorazepate (TRANXENE) tablet 3.75 mg (not administered)  aspirin EC tablet 81 mg (not administered)  levothyroxine (SYNTHROID, LEVOTHROID) tablet 150 mcg (not administered)   stroke: mapping our early stages of recovery book (not administered)  acetaminophen (TYLENOL) tablet 650 mg (not administered)    Or  acetaminophen (TYLENOL) suppository 650 mg (not administered)  senna-docusate (Senokot-S) tablet 1 tablet (not administered)  bisacodyl (DULCOLAX) suppository 10 mg (not administered)  ondansetron (ZOFRAN) tablet 4 mg (not administered)    Or  ondansetron (ZOFRAN) injection 4 mg (not administered)  heparin injection 5,000 Units (not administered)  hydrALAZINE (APRESOLINE) injection 5-10 mg (not administered)     Initial Impression / Assessment and Plan / ED Course  I have reviewed the triage vital signs and the nursing notes.  Pertinent labs & imaging results that were available during my care of the patient were reviewed by me and considered in my medical decision making (see chart for details).  Clinical Course as of Nov 07 1518  Tue Nov 06, 2016  1258 Patient continues to deny recurrence of symptoms.  [SJ]  8 Spoke with Dr. Cheral Marker, neurologist, who agrees with admission for TIA work up. Neurology will consult.   [SJ]  7672 Spoke with Sharene Butters, [facility], who  agreed to admit the patient.  [SJ]    Clinical Course User Index [SJ] Damichael Hofman C, PA-C    Patient presents with symptoms giving concern for possible TIA. No recurrence of patient's complaint symptoms during time in the ED. Neurology consulted. Admission for continued TIA workup.   Findings and plan of care discussed with Trish Mage, MD.   Vitals:   11/06/16 1226 11/06/16 1230 11/06/16 1245 11/06/16 1300  BP: 133/66 (!) 142/66 (!) 149/67 137/63  Pulse:  (!) 54 (!) 51 (!) 50  Resp: Marland Kitchen)  21 (!) 21 17 11   Temp:      TempSrc:      SpO2:  100% 100% 100%  Weight:      Height:         Final Clinical Impressions(s) / ED Diagnoses   Final diagnoses:  TIA (transient ischemic attack)  TIA (transient ischemic attack)  TIA (transient ischemic attack)    New Prescriptions New Prescriptions   No medications on file     Layla Maw 11/06/16 1521    Veryl Speak, MD 11/07/16 [phone]

## 2016-11-06 NOTE — ED Notes (Signed)
cbg was 89

## 2016-11-06 NOTE — Consult Note (Signed)
Requesting Physician: Dr. Marily Memos    Chief Complaint: TIA  History obtained from:  Patient  HPI:                                                                                                                                      Mikayla Harrison is an 83 y.o. female who presented to [facility] ED with complaints of transient facial numbness/stiffness/tingling of the right cheek/jaw/chin that did not cross midline. She stated that there are some personal things she's going through that made her anxious and the facial numbness ensued shortly following this anxiety. The numbness/tingling and anxiety worsened and she took a chlorazepate to relieve her symptoms. Symptoms were not relieved quickly enough and Mikayla Harrison called for EMS assistance. She stated that the "attack" lasted approximately 15 minutes.   Ms. Mikayla Harrison has a history of anxiety "attacks" for which she takes the chlorazepate, which typically manifests as perioral numbness. Denies blurry or double vision, numbness or weakness of any extremity of focal symptoms otherwise.  CT head: No acute pathology; Chronic small vessel disease.  Date last known well: Date: 11/06/2016 Time last known well: Time: 10:30  tPA Given: No: Symptoms resolved  Modified Rankin Score: Rankin Score=0  Stroke Risk Factors - family history, hypertension and prediabetes   Past Medical History:  Diagnosis Date  . Anemia   . CKD (chronic kidney disease) stage 3, GFR 30-59 ml/min   . Colon polyp   . Diverticulosis   . Hyperlipidemia   . Hyperlipidemia   . Hypertension   . Hypothyroidism   . Iron deficiency   . Prediabetes   . Prediabetes   . Small bowel obstruction [facility])     Past Surgical History:  Procedure Laterality Date  . CATARACT EXTRACTION    . MACULAR TEAR    . Vericose vein  1960    Family History  Problem Relation Age of Onset  . Heart disease Mother   . Stroke Mother   . Heart disease Father   . Colon polyps Sister   . Diabetes Sister    . Stroke Sister   . Heart disease Brother   . Stroke Brother      reports that she quit smoking about 51 years ago. She has a 15.00 pack-year smoking history. She has never used smokeless tobacco. She reports that she does not drink alcohol or use drugs.  Allergies  Allergen Reactions  . Sulfa Antibiotics Other (See Comments)  . Asa [Aspirin] Other (See Comments)    Take 81mg  cannot take 325mg  gets nervous  . Codeine Nausea Only  . Prednisone Nausea And Vomiting    Medications:  Current Meds  Medication Sig  . acetaminophen (TYLENOL) 325 MG tablet Take 325 mg by mouth every 6 (six) hours as needed.  Marland Kitchen aspirin 81 MG tablet Take 81 mg by mouth daily.    . benazepril (LOTENSIN) 40 MG tablet Take 1 tablet (40 mg total) by mouth daily.  . Cholecalciferol (VITAMIN D3) 1000 units CAPS Take 1,000 Units by mouth 2 (two) times daily.  . clorazepate (TRANXENE-T) 7.5 MG tablet Take 1/2 to 1 tablet 2 to 3 x / day if needed for anxiety  . levothyroxine (SYNTHROID, LEVOTHROID) 150 MCG tablet TAKE 1 TABLET EVERY DAY  . Magnesium 100 MG CAPS Take 1 mg by mouth every morning.  . meclizine (ANTIVERT) 25 MG tablet 1/2-1 pill up to 3 times daily for motion sickness/dizziness    Review Of Systems:                                                                                                           History obtained from the patient  General: Negative for chills Psychological: Positive for anxiety Ophthalmic: Negative for blurry or double vision, loss of vision in any field ENT: Negative for vertigo Respiratory: Positive for occasional cough; no shortness of breath or wheezing Cardiovascular: Negative for chest pain, edema or irregular heartbeat Gastrointestinal: Negative for abdominal pain; Nausea resolved Musculoskeletal: Negative for joint swelling or muscular weakness. Some  occasional knee troubles (chronic) Neurological: As noted in HPI Dermatological: Negative for rash or skin changes, numbness or tingling  Blood pressure 137/63, pulse (!) 50, temperature 98.4 F (36.9 C), temperature source Oral, resp. rate 11, height 5\' 5"  (1.651 m), weight 72.6 kg (160 lb), SpO2 100 %.   Physical Examination:                                                                                                      General: WDWN female. Resting in bed. Appears anxious. HEENT:  Normocephalic, no lesions, without obvious abnormality.  Normal external eye and conjunctiva.  Normal external ears. Normal external nose, mucus membranes and septum.  Normal pharynx. Cardiovascular: S1, S2 normal, pulses palpable throughout   Pulmonary: chest clear, no wheezing, rales, normal symmetric air entry Abdomen: Soft, non-tender Extremities: no joint deformities, effusion, or inflammation Musculoskeletal: no joint tenderness, deformity or swelling. Tone and bulk normal; no atrophy or fasciculations noted Skin: warm and dry, no hyperpigmentation, vitiligo, or suspicious lesions  Neurological Examination:  Mental Status: Alert, oriented, thought content appropriate.  Speech fluent without evidence of aphasia. Has dry mouth. Able to follow 3-step commands without difficulty. Cranial Nerves: II: Visual fields grossly normal, pupils are equal, round, reactive to light  III,IV, VI: Ptosis not present, extra-ocular muscle movements intact bilaterally V,VII: Smile and eyebrow raise is symmetric. Facial light touch and pinprick sensation intact bilaterally (previous numbness/tingling was in the region of CNV maxillary and mandibular branches) VIII: Hearing grossly intact IX,X: Uvula and palate rise symmetrically XI: SCM and bilateral shoulder shrug strength 5/5 XII: Midline tongue extension Motor: Right :      Upper extremity   5/5   Left:     Upper extremity   5/5          Lower extremity   5/5    Lower extremity   5/5 Pronator drift not present Sensory: Pinprick and light touch intact throughout, bilaterally Deep Tendon Reflexes: 2+ and symmetric throughout BUE; 1+ BLE Plantars: Right: downgoing   Left: downgoing Cerebellar: Finger-to-nose test without evidence of dysmetria or ataxia. Heel-to-shin test executed within normal limits. Proprioception: Vibratory sense intact, but delayed Gait: Not tested; Per previous note, gait within normal limits.  Lab Results: Basic Metabolic Panel:  Recent Labs Lab 11/06/16 1149 11/06/16 1242  NA 137 139  K 4.4 4.3  CL 104 101  CO2 27  --   GLUCOSE 105* 99  BUN 13 15  CREATININE 1.03* 1.00  CALCIUM 8.9  --     Liver Function Tests:  Recent Labs Lab 11/06/16 1149  AST 25  ALT 15  ALKPHOS 67  BILITOT 0.5  PROT 6.6  ALBUMIN 3.6   No results for input(s): LIPASE, AMYLASE in the last 168 hours. No results for input(s): AMMONIA in the last 168 hours.  CBC:  Recent Labs Lab 11/06/16 1149 11/06/16 1242  WBC 7.9  --   NEUTROABS 5.9  --   HGB 11.1* 11.6*  HCT 35.2* 34.0*  MCV 88.7  --   PLT 209  --     Cardiac Enzymes: No results for input(s): CKTOTAL, CKMB, CKMBINDEX, TROPONINI in the last 168 hours.  Lipid Panel: No results for input(s): CHOL, TRIG, HDL, CHOLHDL, VLDL, LDLCALC in the last 168 hours.  CBG:  Recent Labs Lab 11/06/16 1257  GLUCAP 89    Microbiology: No results found for this or any previous visit.  Coagulation Studies:  Recent Labs  11/06/16 1149  LABPROT 13.5  INR 1.03    Imaging: Ct Head Wo Contrast  Result Date: 11/06/2016 CLINICAL DATA:  Right-sided facial numbness, now resolved. EXAM: CT HEAD WITHOUT CONTRAST TECHNIQUE: Contiguous axial images were obtained from the base of the skull through the vertex without intravenous contrast. COMPARISON:  None. FINDINGS: Brain: There is no evidence of  acute infarct, intracranial hemorrhage, mass, midline shift, or extra-axial fluid collection. The ventricles and sulci are within normal limits for age. Deep cerebral white matter hypodensities are nonspecific but compatible with mild chronic small vessel ischemic disease. There is a suspected lacunar infarct in the right lentiform nucleus and a dilated perivascular space versus lacunar infarct inferiorly in the left lentiform nucleus/external capsule, both of which are age indeterminate though potentially chronic. Vascular: Calcified atherosclerosis at the skullbase. No hyperdense vessel. Skull: No fracture focal osseous lesion. Sinuses/Orbits: Mild left maxillary sinus mucosal thickening. Clear mastoid air cells. Right cataract extraction. Other: None. IMPRESSION: 1. No evidence of acute cortically based infarct or intracranial hemorrhage. 2. Mild chronic small vessel ischemic  disease with age indeterminate (though potentially chronic) lacunar infarcts and/or dilated perivascular spaces in the basal ganglia. Electronically Signed   By: Logan Bores M.D.   On: 11/06/2016 12:21    History and examination documented by Lupita Raider PA-C, [facility] 11/06/2016, 2:26 PM  Assessment and Plan:  83 year old female with transient sensory numbness  1. Most likely anxiety related. However, TIA is also on the DDx.  2. CT head shows no evidence of acute cortically based infarct or intracranial hemorrhage. Mild chronic small vessel ischemic disease with age indeterminate (though potentially chronic) lacunar infarcts and/or dilated perivascular spaces are seen in the basal ganglia. 3. The patient refused further testing for TIA work up, including MRI brain. She demonstrates normal mentation on Neurological examination and therefore has the capacity to make her own medical decisions, including refusal of recommended tests. We have discussed risks/benefits of refusing further testing and patient maintains her  decision not to proceed with further testing. 4. Continue ASA.  5. She should return to the ED if she has lateralized deficits, aphasia or vision loss. She was reassured that bilateral hand numbness in isolation has a very low likelihood of being secondary to stroke and is usually self-limited. DDx for etiology includes hyperventilation in the setting of anxiety.     Electronically signed: Dr. Kerney Elbe

## 2016-11-06 NOTE — ED Notes (Signed)
Pt from home via [facility] with c/o right sided facial numbness now resolved at 1030 today.  Pt reports it was preceded by nervousness, a "paniced" feeling, "goosebumps", right hand trembling, and nausea.  On EMS arrival pt was negative for stroke but has a significant family hx stroke, both mother and brother deceased from stroke.  NAD, A&O.

## 2016-11-07 ENCOUNTER — Observation Stay (HOSPITAL_BASED_OUTPATIENT_CLINIC_OR_DEPARTMENT_OTHER): Payer: Medicare HMO

## 2016-11-07 ENCOUNTER — Observation Stay (HOSPITAL_COMMUNITY): Payer: Medicare HMO

## 2016-11-07 DIAGNOSIS — I10 Essential (primary) hypertension: Secondary | ICD-10-CM | POA: Diagnosis not present

## 2016-11-07 DIAGNOSIS — R7303 Prediabetes: Secondary | ICD-10-CM | POA: Diagnosis not present

## 2016-11-07 DIAGNOSIS — N183 Chronic kidney disease, stage 3 (moderate): Secondary | ICD-10-CM | POA: Diagnosis not present

## 2016-11-07 DIAGNOSIS — G459 Transient cerebral ischemic attack, unspecified: Secondary | ICD-10-CM | POA: Diagnosis not present

## 2016-11-07 DIAGNOSIS — R200 Anesthesia of skin: Secondary | ICD-10-CM | POA: Diagnosis not present

## 2016-11-07 LAB — CBC
HCT: 33.6 % — ABNORMAL LOW (ref 36.0–46.0)
Hemoglobin: 10.6 g/dL — ABNORMAL LOW (ref 12.0–15.0)
MCH: 28 pg (ref 26.0–34.0)
MCHC: 31.5 g/dL (ref 30.0–36.0)
MCV: 88.7 fL (ref 78.0–100.0)
Platelets: 202 10*3/uL (ref 150–400)
RBC: 3.79 MIL/uL — ABNORMAL LOW (ref 3.87–5.11)
RDW: 13.6 % (ref 11.5–15.5)
WBC: 6.8 10*3/uL (ref 4.0–10.5)

## 2016-11-07 LAB — VAS US CAROTID
LEFT ECA DIAS: -12 cm/s
LEFT VERTEBRAL DIAS: -14 cm/s
Left CCA dist dias: -16 cm/s
Left CCA dist sys: -72 cm/s
Left CCA prox dias: 18 cm/s
Left CCA prox sys: 70 cm/s
Left ICA dist dias: -24 cm/s
Left ICA dist sys: -101 cm/s
Left ICA prox dias: -18 cm/s
Left ICA prox sys: -66 cm/s
RIGHT ECA DIAS: -10 cm/s
RIGHT VERTEBRAL DIAS: -11 cm/s
Right CCA prox dias: -16 cm/s
Right CCA prox sys: -104 cm/s
Right cca dist sys: -64 cm/s

## 2016-11-07 LAB — COMPREHENSIVE METABOLIC PANEL
Alkaline Phosphatase: 63 U/L (ref 38–126)
Anion gap: 6 (ref 5–15)
BUN: 13 mg/dL (ref 6–20)
CO2: 25 mmol/L (ref 22–32)
Calcium: 8.7 mg/dL — ABNORMAL LOW (ref 8.9–10.3)
Creatinine, Ser: 1.04 mg/dL — ABNORMAL HIGH (ref 0.44–1.00)
GFR calc Af Amer: 56 mL/min — ABNORMAL LOW (ref >60)
GFR calc non Af Amer: 49 mL/min — ABNORMAL LOW (ref >60)
Sodium: 139 mmol/L (ref 135–145)
Total Protein: 6.2 g/dL — ABNORMAL LOW (ref 6.5–8.1)

## 2016-11-07 LAB — COMPREHENSIVE METABOLIC PANEL WITH GFR
ALT: 12 U/L — ABNORMAL LOW (ref 14–54)
AST: 21 U/L (ref 15–41)
Albumin: 3.2 g/dL — ABNORMAL LOW (ref 3.5–5.0)
Chloride: 108 mmol/L (ref 101–111)
Glucose, Bld: 94 mg/dL (ref 65–99)
Potassium: 4.1 mmol/L (ref 3.5–5.1)
Total Bilirubin: 0.6 mg/dL (ref 0.3–1.2)

## 2016-11-07 LAB — LIPID PANEL
Cholesterol: 129 mg/dL (ref 0–200)
HDL: 38 mg/dL — ABNORMAL LOW (ref >40)
LDL Cholesterol: 77 mg/dL (ref 0–99)
Total CHOL/HDL Ratio: 3.4 RATIO
Triglycerides: 69 mg/dL (ref <150)
VLDL: 14 mg/dL (ref 0–40)

## 2016-11-07 NOTE — Care Management Note (Signed)
Case Management Note  Patient Details  Name: Mikayla Harrison MRN: [id number] Date of Birth: 03-19-1932  Subjective/Objective:    Pt in with hypertension. She is from home alone.                 Action/Plan: Plan is for patient to return home when medically ready. CM following for d/c needs, physician orders.   Expected Discharge Date:                  Expected Discharge Plan:  Home/Self Care  In-House Referral:     Discharge planning Services     Post Acute Care Choice:    Choice offered to:     DME Arranged:    DME Agency:     HH Arranged:    HH Agency:     Status of Service:  In process, will continue to follow  If discussed at Long Length of Stay Meetings, dates discussed:    Additional Comments:  Pollie Friar, RN 11/07/2016, 11:38 AM

## 2016-11-07 NOTE — Evaluation (Signed)
Physical Therapy Evaluation and Discharge Patient Details Name: Mikayla Harrison MRN: [id number] DOB: 12-17-1931 Today's Date: 11/07/2016   History of Present Illness  Pt is 83 y/o female admitted secondary to R facial numbness and HTN. PMH includes CKD, TIA, HTN, and pre-diabetes.   Clinical Impression  Pt admitted for problem above with deficits below. PTA, pt reports she was independent with mobility and was still working and driving. Upon evaluation, pt very anxious and agitated with PT presence. Questioned throughout about PT role and why she needed to be seen. Pt slightly unsteady with gait without AD, however, pt adamantly refusing use of RW. Educated about deficits, however, pt adamantly refusing continued and follow up PT. Requesting to go home ASAP. Will sign off secondary to pt refusal. Please re-consult if needs change.     Follow Up Recommendations No PT follow up;Supervision for mobility/OOB    Equipment Recommendations  None recommended by PT    Recommendations for Other Services       Precautions / Restrictions Precautions Precautions: None Restrictions Weight Bearing Restrictions: No      Mobility  Bed Mobility Overal bed mobility: Modified Independent             General bed mobility comments: Increased time, but no assist required.   Transfers Overall transfer level: Needs assistance Equipment used: None Transfers: Sit to/from Stand Sit to Stand: Supervision         General transfer comment: Supervision for safety.   Ambulation/Gait Ambulation/Gait assistance: Min guard;Min assist Ambulation Distance (Feet): 125 Feet Assistive device: None Gait Pattern/deviations: Step-through pattern;Decreased stride length;Staggering right;Trunk flexed Gait velocity: Decreased Gait velocity interpretation: Below normal speed for age/gender General Gait Details: Slow, unsteady gait. Pt demonstrated LOB X 1 at beginning of gait training and ran into R side of door.  Pt complaining medicine had made her "swimmy headed." Min guard for safety thereafter. Pt refusing use of RW adamantly stating "I don't need that" and generally annoyed PT would suggest using RW.   Stairs Stairs: Yes Stairs assistance: Min guard Stair Management: One rail Right;Alternating pattern;Forwards Number of Stairs: 4 General stair comments: overall steady with stair management. Safe technique. Min guard for safety.   Wheelchair Mobility    Modified Rankin (Stroke Patients Only)       Balance Overall balance assessment: Needs assistance Sitting-balance support: No upper extremity supported;Feet supported Sitting balance-Leahy Scale: Good     Standing balance support: No upper extremity supported;During functional activity Standing balance-Leahy Scale: Fair Standing balance comment: LOB X 1, but overall safe gait.                              Pertinent Vitals/Pain Pain Assessment: No/denies pain    Home Living Family/patient expects to be discharged to:: Private residence Living Arrangements: Children;Alone Available Help at Discharge: Family;Available PRN/intermittently Type of Home: House Home Access: Stairs to enter Entrance Stairs-Rails: Right;Left;Can reach both Entrance Stairs-Number of Steps: 2 Home Layout: Two level (split level ) Home Equipment: None Additional Comments: Reports son lives with pt, but is gone for weeks at a time so is mostly alone    Prior Function Level of Independence: Independent         Comments: stillworks and drives     Hand Dominance   Dominant Hand: Right    Extremity/Trunk Assessment   Upper Extremity Assessment Upper Extremity Assessment: Overall WFL for tasks assessed    Lower Extremity Assessment  Lower Extremity Assessment: Generalized weakness    Cervical / Trunk Assessment Cervical / Trunk Assessment: Kyphotic  Communication   Communication: No difficulties  Cognition Arousal/Alertness:  Awake/alert Behavior During Therapy: Anxious;Agitated Overall Cognitive Status: Within Functional Limits for tasks assessed                                 General Comments: Pt very anxious and agitated with PT questions about home and DME at home, stating, I don't need anything to get around with. Very annoyed with mobility training and asking multiple times the role of PT.       General Comments General comments (skin integrity, edema, etc.): Educated about deficits, and pt stated it was because of medicine. Educated about use of RW for short period and HHPT, however, pt adamantly refusing both and becoming agitated that PT would suggest. Pt states she does not need any follow up therapy and would like to go home.     Exercises     Assessment/Plan    PT Assessment Patient needs continued PT services;Patent does not need any further PT services  PT Problem List         PT Treatment Interventions      PT Goals (Current goals can be found in the Care Plan section)  Acute Rehab PT Goals Patient Stated Goal: to go home ASAP PT Goal Formulation: With patient Time For Goal Achievement: 11/07/16 Potential to Achieve Goals: Good    Frequency     Barriers to discharge        Co-evaluation               AM-PAC PT "6 Clicks" Daily Activity  Outcome Measure Difficulty turning over in bed (including adjusting bedclothes, sheets and blankets)?: A Little Difficulty moving from lying on back to sitting on the side of the bed? : A Little Difficulty sitting down on and standing up from a chair with arms (e.g., wheelchair, bedside commode, etc,.)?: A Little Help needed moving to and from a bed to chair (including a wheelchair)?: None Help needed walking in hospital room?: A Little Help needed climbing 3-5 steps with a railing? : A Little 6 Click Score: 19    End of Session Equipment Utilized During Treatment: Gait belt Activity Tolerance: Patient tolerated  treatment well Patient left: in bed;with call bell/phone within reach Nurse Communication: Mobility status PT Visit Diagnosis: Unsteadiness on feet (R26.81)    Time: 5462-7035 PT Time Calculation (min) (ACUTE ONLY): 12 min   Charges:   PT Evaluation $PT Eval Low Complexity: 1 Procedure     PT G Codes:   PT G-Codes **NOT FOR INPATIENT CLASS** Functional Assessment Tool Used: AM-PAC 6 Clicks Basic Mobility;Clinical judgement Functional Limitation: Mobility: Walking and moving around Mobility: Walking and Moving Around Current Status (K0938): At least 20 percent but less than 40 percent impaired, limited or restricted Mobility: Walking and Moving Around Goal Status [phone]): At least 20 percent but less than 40 percent impaired, limited or restricted Mobility: Walking and Moving Around Discharge Status [phone]): At least 20 percent but less than 40 percent impaired, limited or restricted    Nicky Pugh, PT, DPT  Acute Rehabilitation Services  Pager: [place] 11/07/2016, 1:28 PM

## 2016-11-07 NOTE — Discharge Instructions (Signed)
Follow with Primary MD [name], MD in 7 days   Get echocardiogram, CBC, CMP, fasting lipid panel and A1c, by Primary MD or SNF MD in 5-7 days ( we routinely change or add medications that can affect your baseline labs and fluid status, therefore we recommend that you get the mentioned basic workup next visit with your PCP, your PCP may decide not to get them or add new tests based on their clinical decision)  Activity: As tolerated with Full fall precautions use walker/cane & assistance as needed  Disposition Home    Diet:  Heart Healthy    For Heart failure patients - Check your Weight same time everyday, if you gain over 2 pounds, or you develop in leg swelling, experience more shortness of breath or chest pain, call your Primary MD immediately. Follow Cardiac Low Salt Diet and 1.5 lit/day fluid restriction.  On your next visit with your primary care physician please Get Medicines reviewed and adjusted.  Please request your Prim.MD to go over all Hospital Tests and Procedure/Radiological results at the follow up, please get all Hospital records sent to your Prim MD by signing hospital release before you go home.  If you experience worsening of your admission symptoms, develop shortness of breath, life threatening emergency, suicidal or homicidal thoughts you must seek medical attention immediately by calling 911 or calling your MD immediately  if symptoms less severe.  You Must read complete instructions/literature along with all the possible adverse reactions/side effects for all the Medicines you take and that have been prescribed to you. Take any new Medicines after you have completely understood and accpet all the possible adverse reactions/side effects.   Do not drive, operate heavy machinery, perform activities at heights, swimming or participation in water activities or provide baby sitting services if your were admitted for syncope or siezures until you have seen by Primary MD  or a Neurologist and advised to do so again.  Do not drive when taking Pain medications.    Do not take more than prescribed Pain, Sleep and Anxiety Medications  Special Instructions: If you have smoked or chewed Tobacco  in the last 2 yrs please stop smoking, stop any regular Alcohol  and or any Recreational drug use.  Wear Seat belts while driving.   Please note  You were cared for by a hospitalist during your hospital stay. If you have any questions about your discharge medications or the care you received while you were in the hospital after you are discharged, you can call the unit and asked to speak with the hospitalist on call if the hospitalist that took care of you is not available. Once you are discharged, your primary care physician will handle any further medical issues. Please note that NO REFILLS for any discharge medications will be authorized once you are discharged, as it is imperative that you return to your primary care physician (or establish a relationship with a primary care physician if you do not have one) for your aftercare needs so that they can reassess your need for medications and monitor your lab values.

## 2016-11-07 NOTE — Care Management Obs Status (Signed)
[facility] NOTIFICATION   Patient Details  Name: Mikayla Harrison MRN: [id number] Date of Birth: 1932/01/09   Medicare Observation Status Notification Given:  Yes    Erenest Rasher, RN 11/07/2016, 10:26 AM

## 2016-11-07 NOTE — Progress Notes (Signed)
Discharge instructions reviewed with patient. All questions answered at this time. Awaiting for family top transport home.   Ave Filter, RN

## 2016-11-07 NOTE — Care Management Note (Signed)
Case Management Note  Patient Details  Name: MAKALA FETTEROLF MRN: [id number] Date of Birth: 07-27-1931  Subjective/Objective:                    Action/Plan: Pt discharging home with [facility]. CM met with the patient and she does not want HH. She states she is independent and does not need any assistance or therapy at home.  Will updated MD. Bedside RN notified.   Expected Discharge Date:  11/07/16               Expected Discharge Plan:  [place]  In-House Referral:     Discharge planning Services  CM Consult  Post Acute Care Choice:  Home Health Choice offered to:  Patient  DME Arranged:    DME Agency:     HH Arranged:  Patient Refused [place] Agency:     Status of Service:  Completed, signed off  If discussed at [employer], dates discussed:    Additional Comments:  Pollie Friar, RN 11/07/2016, 1:56 PM

## 2016-11-07 NOTE — Progress Notes (Signed)
STROKE TEAM PROGRESS NOTE   SUBJECTIVE (INTERVAL HISTORY) Mikayla Harrison states she had 15 minute episode of facial numbness which has resolved. She denies any prior history of strokes or TIAs. She has had most of the stroke workup and wants to go home   OBJECTIVE Temp:  [98.3 F (36.8 C)-98.4 F (36.9 C)] 98.3 F (36.8 C) (05/30 0952) Pulse Rate:  [50-98] 62 (05/30 0952) Cardiac Rhythm: Sinus bradycardia (05/30 0700) Resp:  [11-22] 20 (05/30 0952) BP: (115-157)/(61-86) 117/64 (05/30 0952) SpO2:  [93 %-100 %] 98 % (05/30 0952) Weight:  [72.6 kg (160 lb)] 72.6 kg (160 lb) (05/29 1147)  CBC:  Recent Labs Lab 11/06/16 1149 11/06/16 1242 11/07/16 0426  WBC 7.9  --  6.8  NEUTROABS 5.9  --   --   HGB 11.1* 11.6* 10.6*  HCT 35.2* 34.0* 33.6*  MCV 88.7  --  88.7  PLT 209  --  062    Basic Metabolic Panel:  Recent Labs Lab 11/06/16 1149 11/06/16 1242 11/07/16 0426  NA 137 139 139  K 4.4 4.3 4.1  CL 104 101 108  CO2 27  --  25  GLUCOSE 105* 99 94  BUN 13 15 13   CREATININE 1.03* 1.00 1.04*  CALCIUM 8.9  --  8.7*    PHYSICAL EXAM Pleasant frail elderly Caucasian lady currently not in distress. . Afebrile. Head is nontraumatic. Neck is supple without bruit.    Cardiac exam no murmur or gallop. Lungs are clear to auscultation. Distal pulses are well felt. Neurological Exam ;  Awake  Alert oriented x 3. Normal speech and language.eye movements full without nystagmus.fundi were not visualized. Vision acuity and fields appear normal. Hearing is normal. Palatal movements are normal. Face symmetric. Tongue midline. Normal strength, tone, reflexes and coordination. Normal sensation. Gait deferred.  ASSESSMENT/PLAN Ms. Mikayla Harrison is a 83 y.o. female with history of HTN, HLD, CKD, hypothyroidism, prediabetes presenting with transient R facial numbness in the setting of anxiety. She did not receive IV t-PA due to symptoms resolved.   TIA vs  anxiety  CT no acute stroke. Small vessel  disease.   MRI  No acute infarct. Small vessel disease.   MRA  No significant occlusion/stenosis seen. Motion degraded.  Carotid Doppler  B ICA 1-39% stenosis, VAs antegrade   2D Echo  pending and will be arranged as an outpatient LDL 77  HgbA1c 5.7 in April  Heparin 5000 units sq tid for VTE prophylaxis  Diet regular Room service appropriate? Yes; Fluid consistency: Thin  aspirin 81 mg daily prior to admission, now on aspirin 81 mg daily  Therapy recommendations:  pending   Disposition:  pending   Hypertension  Stable  Hyperlipidemia  Home meds:  No statin  LDL 77  Pre-Diabetes   HgbA1c 5.7 in April   Other Stroke Risk Factors  Advanced age  Former Cigarette smoker  UDS / ETOH level not performed   Family hx stroke (mother and brother)  Other Active Problems  CKD stage 3  Hypothyroidism  Anemia, normocytic  Anemia   Hospital day # 0  I have personally examined this patient, reviewed notes, independently viewed imaging studies, participated in medical decision making and plan of care.ROS completed by me personally and pertinent positives fully documented  I have made any additions or clarifications directly to the above note. Agree with discharging patient and arranging echo as an outpatient. Continue aspirin for stroke prevention. Follow-up as an outpatient in stroke clinic in 6 weeks.  Greater than 50% time during this 25 minute visit was spent on counseling and coordination of care of your TIA, stroke risk factor discussion and answering questions  Antony Contras, MD Medical Director [place] Pager: [phone] 11/07/2016 5:00 PM   To contact Stroke Continuity provider, please refer to [email or web address] After hours, contact General Neurology

## 2016-11-07 NOTE — Progress Notes (Signed)
*  PRELIMINARY RESULTS* Vascular Ultrasound Carotid Duplex (Doppler) has been completed.  Preliminary findings: Bilateral 1-39% ICA stenosis, antegrade vertebral flow.   Everrett Coombe 11/07/2016, 9:28 AM

## 2016-11-07 NOTE — Discharge Summary (Signed)
Mikayla Harrison QIW:[id number] DOB: 1931-12-27 DOA: 11/06/2016  PCP: [name], MD  Admit date: 11/06/2016  Discharge date: 11/07/2016  Admitted From: Home   Disposition:  Home   Recommendations for Outpatient Follow-up:   Follow up with PCP in 1-2 weeks  PCP Please obtain BMP/CBC, 2 view CXR in 1week,  (see Discharge instructions)   PCP Please follow up on the following pending results: Kindly check echocardiogram within the next 7-10 days for completion of TIA workup, monitor lipid panel   Home Health: PT   Equipment/Devices: None  Consultations: Neurology Discharge Condition: Stable   CODE STATUS: Full   Diet Recommendation:  Heart Healthy      Chief Complaint  Patient presents with  . Numbness     Brief history of present illness from the day of admission and additional interim summary     Mikayla Harrison is a 83 y.o. female with medical history significant for HTN, HLD, CKD, hypothyroidism, prediabetes, presenting to the ED with one episode of right sided numbness and right facial numbness beginning around 1030 this morning, and resolving within 15 minutes. The patient failed to be having a stroke, and therefore calling 911. She reports having occasional similar episodes in the recent past, but she never reported. She reported feeling of nervousness, nausea, without vomiting. She denies any vertigo.  Denies any blurred or double vision ,She denies any fever, chills night sweats, recent infections, sick contacts, recent long distance travel. She denies any new medications.Denies taking  hormonal supplements.  She denies any unilateral weakness. She denies any dysphagia or dysarthria. She denies any shortness of breath and cough. She denies any chest pain or palpitations. She is symptom free at this time. Risk   factors for stroke include CVA in mother and brother, hypertension, hyperlipidemia, and prediabetes, age .                                                                 Hospital Course    Right-sided leg numbness. Seen by neurology, thought to be either resolved TIA or anxiety related. MRI brain nonacute, carotid duplex unremarkable, A1c stable checked recently, LDL borderline high and patient recommended to be on heart healthy diet and continue home dose statin, her symptoms have completely resolved and she wants to be discharged ASAP, we'll get home PT, request PCP to check echocardiogram within a week in the outpatient setting. TIA workup, also monitor her LDL. In consideration of her age diet adjustment would be best rather than increasing her home statin dose.  Prediabetes. Stable.  Hypertension. Continue home regimen unchanged.  Dyslipidemia. Continue home dose statins. Diet control, PCP to monitor.  CK D stage II. At baseline.  Hypothyroidism. On home dose Synthroid.  Anxiety. On chronic Tranxene  at home continue. Currently appears  to be stable. PCP to monitor.  Discharge diagnosis     Active Problems:   Hypertension   Hypothyroidism   Prediabetes   Hyperlipidemia   Vitamin D deficiency   CKD Stage III (GFR 56 ml/min)   Morbid obesity (HCC)  (BMI 30.41)    Raynaud phenomenon   TIA (transient ischemic attack)    Discharge instructions    Discharge Instructions    Diet - low sodium heart healthy    Complete by:  As directed    Discharge instructions    Complete by:  As directed    Follow with Primary MD [name], MD in 7 days   Get echocardiogram, CBC, CMP, fasting lipid panel and A1c, by Primary MD or SNF MD in 5-7 days ( we routinely change or add medications that can affect your baseline labs and fluid status, therefore we recommend that you get the mentioned basic workup next visit with your PCP, your PCP may decide not to get them or add new tests  based on their clinical decision)  Activity: As tolerated with Full fall precautions use walker/cane & assistance as needed  Disposition Home    Diet:  Heart Healthy    For Heart failure patients - Check your Weight same time everyday, if you gain over 2 pounds, or you develop in leg swelling, experience more shortness of breath or chest pain, call your Primary MD immediately. Follow Cardiac Low Salt Diet and 1.5 lit/day fluid restriction.  On your next visit with your primary care physician please Get Medicines reviewed and adjusted.  Please request your Prim.MD to go over all Hospital Tests and Procedure/Radiological results at the follow up, please get all Hospital records sent to your Prim MD by signing hospital release before you go home.  If you experience worsening of your admission symptoms, develop shortness of breath, life threatening emergency, suicidal or homicidal thoughts you must seek medical attention immediately by calling 911 or calling your MD immediately  if symptoms less severe.  You Must read complete instructions/literature along with all the possible adverse reactions/side effects for all the Medicines you take and that have been prescribed to you. Take any new Medicines after you have completely understood and accpet all the possible adverse reactions/side effects.   Do not drive, operate heavy machinery, perform activities at heights, swimming or participation in water activities or provide baby sitting services if your were admitted for syncope or siezures until you have seen by Primary MD or a Neurologist and advised to do so again.  Do not drive when taking Pain medications.    Do not take more than prescribed Pain, Sleep and Anxiety Medications  Special Instructions: If you have smoked or chewed Tobacco  in the last 2 yrs please stop smoking, stop any regular Alcohol  and or any Recreational drug use.  Wear Seat belts while driving.   Please note  You  were cared for by a hospitalist during your hospital stay. If you have any questions about your discharge medications or the care you received while you were in the hospital after you are discharged, you can call the unit and asked to speak with the hospitalist on call if the hospitalist that took care of you is not available. Once you are discharged, your primary care physician will handle any further medical issues. Please note that NO REFILLS for any discharge medications will be authorized once you are discharged, as it is imperative that you return to your primary  care physician (or establish a relationship with a primary care physician if you do not have one) for your aftercare needs so that they can reassess your need for medications and monitor your lab values.   Increase activity slowly    Complete by:  As directed       Discharge Medications   Allergies as of 11/07/2016      Reactions   Sulfa Antibiotics Other (See Comments)   Asa [aspirin] Other (See Comments)   Take 81mg  cannot take 325mg  gets nervous   Codeine Nausea Only   Prednisone Nausea And Vomiting      Medication List    TAKE these medications   acetaminophen 325 MG tablet Commonly known as:  TYLENOL Take 325 mg by mouth every 6 (six) hours as needed.   aspirin 81 MG tablet Take 81 mg by mouth daily.   atenolol 50 MG tablet Commonly known as:  TENORMIN Take 1 tablet daily for BP and palpitations What changed:  how much to take  how to take this  when to take this  additional instructions   benazepril 40 MG tablet Commonly known as:  LOTENSIN Take 1 tablet (40 mg total) by mouth daily.   clorazepate 7.5 MG tablet Commonly known as:  TRANXENE-T Take 1/2 to 1 tablet 2 to 3 x / day if needed for anxiety What changed:  how much to take  how to take this  when to take this  additional instructions   fluticasone 50 MCG/ACT nasal spray Commonly known as:  FLONASE Place 2 sprays into both nostrils  at bedtime.   levothyroxine 150 MCG tablet Commonly known as:  SYNTHROID, LEVOTHROID TAKE 1 TABLET EVERY DAY   Magnesium 100 MG Caps Take 100 mg by mouth every morning.   meclizine 25 MG tablet Commonly known as:  ANTIVERT 1/2-1 pill up to 3 times daily for motion sickness/dizziness What changed:  how much to take  additional instructions   Vitamin D3 1000 units Caps Take 1,000 Units by mouth 2 (two) times daily.       Follow-up Information    [name], MD. Schedule an appointment as soon as possible for a visit in 2 day(s).   Specialty:  Internal Medicine Contact information: [address] [place] [address] [phone]           Major procedures and Radiology Reports - PLEASE review detailed and final reports thoroughly  -     Vascular Ultrasound Carotid Duplex (Doppler) has been completed.  Preliminary findings: Bilateral 1-39% ICA stenosis, antegrade vertebral     Ct Head Wo Contrast  Result Date: 11/06/2016 CLINICAL DATA:  Right-sided facial numbness, now resolved. EXAM: CT HEAD WITHOUT CONTRAST TECHNIQUE: Contiguous axial images were obtained from the base of the skull through the vertex without intravenous contrast. COMPARISON:  None. FINDINGS: Brain: There is no evidence of acute infarct, intracranial hemorrhage, mass, midline shift, or extra-axial fluid collection. The ventricles and sulci are within normal limits for age. Deep cerebral white matter hypodensities are nonspecific but compatible with mild chronic small vessel ischemic disease. There is a suspected lacunar infarct in the right lentiform nucleus and a dilated perivascular space versus lacunar infarct inferiorly in the left lentiform nucleus/external capsule, both of which are age indeterminate though potentially chronic. Vascular: Calcified atherosclerosis at the skullbase. No hyperdense vessel. Skull: No fracture focal osseous lesion. Sinuses/Orbits: Mild left maxillary  sinus mucosal thickening. Clear mastoid air cells. Right cataract extraction. Other: None. IMPRESSION:  1. No evidence of acute cortically based infarct or intracranial hemorrhage. 2. Mild chronic small vessel ischemic disease with age indeterminate (though potentially chronic) lacunar infarcts and/or dilated perivascular spaces in the basal ganglia. Electronically Signed   By: Logan Bores M.D.   On: 11/06/2016 12:21   Mr Brain Wo Contrast  Result Date: 11/07/2016 CLINICAL DATA:  83 year old female with transient facial numbness on the right side. EXAM: MRI HEAD WITHOUT CONTRAST MRA HEAD WITHOUT CONTRAST TECHNIQUE: Multiplanar, multiecho pulse sequences of the brain and surrounding structures were obtained without intravenous contrast. Angiographic images of the head were obtained using MRA technique without contrast. COMPARISON:  Head CT without contrast 11/06/2016. FINDINGS: MRI HEAD FINDINGS Brain: Metallic susceptibility artifact which seems to be associated with right side dentition degrades diffusion and T2* imaging. No restricted diffusion identified to suggest acute infarction. No midline shift, mass effect, evidence of mass lesion, ventriculomegaly, extra-axial collection or acute intracranial hemorrhage. Cervicomedullary junction and pituitary are within normal limits. Cerebral volume is within normal limits for age. Mild for age scattered nonspecific cerebral white matter T2 and FLAIR hyperintensity. Patchy T2 heterogeneity in the basal ganglia, most pronounced in the globus pallidus, may in part be related to perivascular spaces. No cortical encephalomalacia or chronic cerebral blood products identified. Negative brainstem and cerebellum. Vascular: Major intracranial vascular flow voids are preserved. Skull and upper cervical spine: Negative. Normal bone marrow signal. Sinuses/Orbits: Postoperative changes to the right globe. Otherwise negative orbits soft tissues. Mild left maxillary sinus mucosal  thickening. Other Visualized paranasal sinuses and mastoids are stable and well pneumatized. Other: Grossly normal visible internal auditory structures. Trace retained secretions in the nasopharynx. Negative scalp soft tissues. MRA HEAD FINDINGS Degraded by the metallic susceptibility artifact stated above and motion artifact. Subsequently, the vertebrobasilar system is not evaluated. Flow signal is present in bilateral posterior communicating arteries. Possible fetal type left PCA origin. There is preserved bilateral PCA flow signal. The left ICA siphon is not well evaluated. There is antegrade flow signal in the proximal right ICA siphon. There is flow signal present at both carotid termini. Bilateral posterior communicating, MCA, and ACA origins are patent. Diminutive or absent anterior communicating artery. Visible bilateral ACA branches are normal. Bilateral MCA bifurcations are patent. No definite MCA M1 stenosis. Right MCA branches are better visualized and appear normal. No left MCA branch occlusion is identified. IMPRESSION: 1. Portions of this study - particularly the MRA - are degraded by dental related metal susceptibility artifact. 2.  No acute intracranial abnormality identified. 3. Mild to moderate for age nonspecific signal changes in the cerebral white matter and basal ganglia. 4. Vertebrobasilar system and left ICA siphon are not evaluated. Bilateral ACAs, MCAs and PCAs are patent. Electronically Signed   By: Genevie Ann M.D.   On: 11/07/2016 07:21   Mr Lovenia Kim  Result Date: 11/07/2016 CLINICAL DATA:  83 year old female with transient facial numbness on the right side. EXAM: MRI HEAD WITHOUT CONTRAST MRA HEAD WITHOUT CONTRAST TECHNIQUE: Multiplanar, multiecho pulse sequences of the brain and surrounding structures were obtained without intravenous contrast. Angiographic images of the head were obtained using MRA technique without contrast. COMPARISON:  Head CT without contrast 11/06/2016.  FINDINGS: MRI HEAD FINDINGS Brain: Metallic susceptibility artifact which seems to be associated with right side dentition degrades diffusion and T2* imaging. No restricted diffusion identified to suggest acute infarction. No midline shift, mass effect, evidence of mass lesion, ventriculomegaly, extra-axial collection or acute intracranial hemorrhage. Cervicomedullary junction and pituitary are within normal  limits. Cerebral volume is within normal limits for age. Mild for age scattered nonspecific cerebral white matter T2 and FLAIR hyperintensity. Patchy T2 heterogeneity in the basal ganglia, most pronounced in the globus pallidus, may in part be related to perivascular spaces. No cortical encephalomalacia or chronic cerebral blood products identified. Negative brainstem and cerebellum. Vascular: Major intracranial vascular flow voids are preserved. Skull and upper cervical spine: Negative. Normal bone marrow signal. Sinuses/Orbits: Postoperative changes to the right globe. Otherwise negative orbits soft tissues. Mild left maxillary sinus mucosal thickening. Other Visualized paranasal sinuses and mastoids are stable and well pneumatized. Other: Grossly normal visible internal auditory structures. Trace retained secretions in the nasopharynx. Negative scalp soft tissues. MRA HEAD FINDINGS Degraded by the metallic susceptibility artifact stated above and motion artifact. Subsequently, the vertebrobasilar system is not evaluated. Flow signal is present in bilateral posterior communicating arteries. Possible fetal type left PCA origin. There is preserved bilateral PCA flow signal. The left ICA siphon is not well evaluated. There is antegrade flow signal in the proximal right ICA siphon. There is flow signal present at both carotid termini. Bilateral posterior communicating, MCA, and ACA origins are patent. Diminutive or absent anterior communicating artery. Visible bilateral ACA branches are normal. Bilateral MCA  bifurcations are patent. No definite MCA M1 stenosis. Right MCA branches are better visualized and appear normal. No left MCA branch occlusion is identified. IMPRESSION: 1. Portions of this study - particularly the MRA - are degraded by dental related metal susceptibility artifact. 2.  No acute intracranial abnormality identified. 3. Mild to moderate for age nonspecific signal changes in the cerebral white matter and basal ganglia. 4. Vertebrobasilar system and left ICA siphon are not evaluated. Bilateral ACAs, MCAs and PCAs are patent. Electronically Signed   By: Genevie Ann M.D.   On: 11/07/2016 07:21    Micro Results     No results found for this or any previous visit (from the past 240 hour(s)).  Today   Subjective    Mikayla Harrison today has no headache,no chest abdominal pain,no new weakness tingling or numbness, feels much better wants to go home today.    Objective   Blood pressure 117/64, pulse 62, temperature 98.3 F (36.8 C), temperature source Oral, resp. rate 20, height 5\' 5"  (1.651 m), weight 72.6 kg (160 lb), SpO2 98 %.   Intake/Output Summary (Last 24 hours) at 11/07/16 1219 Last data filed at 11/07/16 0900  Gross per 24 hour  Intake              360 ml  Output                0 ml  Net              360 ml    Exam Awake Alert, Oriented x 3, No new F.N deficits, Normal affect .AT,PERRAL Supple Neck,No JVD, No cervical lymphadenopathy appriciated.  Symmetrical Chest wall movement, Good air movement bilaterally, CTAB RRR,No Gallops,Rubs or new Murmurs, No Parasternal Heave +ve B.Sounds, Abd Soft, Non tender, No organomegaly appriciated, No rebound -guarding or rigidity. No Cyanosis, Clubbing or edema, No new Rash or bruise   Data Review   CBC w Diff: Lab Results  Component Value Date   WBC 6.8 11/07/2016   HGB 10.6 (L) 11/07/2016   HCT 33.6 (L) 11/07/2016   PLT 202 11/07/2016   LYMPHOPCT 16 11/06/2016   MONOPCT 8 11/06/2016   EOSPCT 2 11/06/2016   BASOPCT 0  11/06/2016  CMP: Lab Results  Component Value Date   NA 139 11/07/2016   K 4.1 11/07/2016   CL 108 11/07/2016   CO2 25 11/07/2016   BUN 13 11/07/2016   CREATININE 1.04 (H) 11/07/2016   CREATININE 1.11 (H) 09/18/2016   PROT 6.2 (L) 11/07/2016   ALBUMIN 3.2 (L) 11/07/2016   BILITOT 0.6 11/07/2016   ALKPHOS 63 11/07/2016   AST 21 11/07/2016   ALT 12 (L) 11/07/2016  . Lab Results  Component Value Date   HGBA1C 5.7 (H) 09/18/2016   Lab Results  Component Value Date   CHOL 129 11/07/2016   HDL 38 (L) 11/07/2016   LDLCALC 77 11/07/2016   TRIG 69 11/07/2016   CHOLHDL 3.4 11/07/2016     Total Time in preparing paper work, data evaluation and todays exam - 58 minutes  Lala Lund M.D on 11/07/2016 at 12:19 PM  [facility]   Office  [phone]

## 2016-11-08 ENCOUNTER — Telehealth: Payer: Self-pay | Admitting: *Deleted

## 2016-11-08 NOTE — Telephone Encounter (Signed)
SCHEDULED PT FOR A  HOSP FU ON 11/13/16 WITH DR MCK

## 2016-11-13 ENCOUNTER — Ambulatory Visit (INDEPENDENT_AMBULATORY_CARE_PROVIDER_SITE_OTHER): Payer: Medicare HMO | Admitting: Internal Medicine

## 2016-11-13 ENCOUNTER — Encounter: Payer: Self-pay | Admitting: Internal Medicine

## 2016-11-13 VITALS — BP 122/60 | HR 60 | Temp 97.6°F | Resp 16 | Ht 62.5 in | Wt 164.2 lb

## 2016-11-13 DIAGNOSIS — G458 Other transient cerebral ischemic attacks and related syndromes: Secondary | ICD-10-CM

## 2016-11-13 DIAGNOSIS — E039 Hypothyroidism, unspecified: Secondary | ICD-10-CM | POA: Diagnosis not present

## 2016-11-13 DIAGNOSIS — Z79899 Other long term (current) drug therapy: Secondary | ICD-10-CM | POA: Diagnosis not present

## 2016-11-13 DIAGNOSIS — R7303 Prediabetes: Secondary | ICD-10-CM

## 2016-11-13 DIAGNOSIS — E559 Vitamin D deficiency, unspecified: Secondary | ICD-10-CM

## 2016-11-13 DIAGNOSIS — N183 Chronic kidney disease, stage 3 unspecified: Secondary | ICD-10-CM

## 2016-11-13 DIAGNOSIS — I10 Essential (primary) hypertension: Secondary | ICD-10-CM

## 2016-11-13 LAB — CBC WITH DIFFERENTIAL/PLATELET
Basophils Absolute: 0 {cells}/uL (ref 0–200)
Basophils Relative: 0 %
Eosinophils Absolute: 154 cells/uL (ref 15–500)
Eosinophils Relative: 2 %
HCT: 33.9 % — ABNORMAL LOW (ref 35.0–45.0)
Hemoglobin: 11 g/dL — ABNORMAL LOW (ref 11.7–15.5)
Lymphocytes Relative: 24 %
Lymphs Abs: 1848 cells/uL (ref 850–3900)
MCH: 28.6 pg (ref 27.0–33.0)
MCHC: 32.4 g/dL (ref 32.0–36.0)
MCV: 88.1 fL (ref 80.0–100.0)
MPV: 11 fL (ref 7.5–12.5)
Monocytes Absolute: 770 cells/uL (ref 200–950)
Monocytes Relative: 10 %
Neutro Abs: 4928 cells/uL (ref 1500–7800)
Neutrophils Relative %: 64 %
Platelets: 237 10*3/uL (ref 140–400)
RBC: 3.85 MIL/uL (ref 3.80–5.10)
RDW: 14.4 % (ref 11.0–15.0)
WBC: 7.7 10*3/uL (ref 3.8–10.8)

## 2016-11-13 NOTE — Patient Instructions (Signed)
Transient Ischemic Attack °A transient ischemic attack (TIA) is a "warning stroke" that causes stroke-like symptoms. Unlike a stroke, a TIA does not cause permanent damage to the brain. The symptoms of a TIA can happen very fast and do not last long. It is important to know the symptoms of a TIA and what to do. This can help prevent a major stroke or death. °What are the causes? °A TIA is caused by a temporary blockage in an artery in the brain or neck (carotid artery). The blockage does not allow the brain to get the blood supply it needs and can cause different symptoms. The blockage can be caused by either: °· A blood clot. °· Fatty buildup (plaque) in a neck or brain artery. ° °What increases the risk? °· High blood pressure (hypertension). °· High cholesterol. °· Diabetes mellitus. °· Heart disease. °· The buildup of plaque in the blood vessels (peripheral artery disease or atherosclerosis). °· The buildup of plaque in the blood vessels that provide blood and oxygen to the brain (carotid artery stenosis). °· An abnormal heart rhythm (atrial fibrillation). °· Obesity. °· Using any tobacco products, including cigarettes, chewing tobacco, or electronic cigarettes. °· Taking oral contraceptives, especially in combination with using tobacco. °· Physical inactivity. °· A diet high in fats, salt (sodium), and calories. °· Excessive alcohol use. °· Use of illegal drugs (especially cocaine and methamphetamine). °· Being female. °· Being African American. °· Being over the age of 55 years. °· Family history of stroke. °· Previous history of blood clots, stroke, TIA, or heart attack. °· Sickle cell disease. °What are the signs or symptoms? °TIA symptoms are the same as a stroke but are temporary. These symptoms usually develop suddenly, or may be newly present upon waking from sleep: °· Sudden weakness or numbness of the face, arm, or leg, especially on one side of the body. °· Sudden trouble walking or difficulty moving  arms or legs. °· Sudden confusion. °· Sudden personality changes. °· Trouble speaking (aphasia) or understanding. °· Difficulty swallowing. °· Sudden trouble seeing in one or both eyes. °· Double vision. °· Dizziness. °· Loss of balance or coordination. °· Sudden severe headache with no known cause. °· Trouble reading or writing. °· Loss of bowel or bladder control. °· Loss of consciousness. ° °How is this diagnosed? °Your health care provider may be able to determine the presence or absence of a TIA based on your symptoms, history, and physical exam. CT scan of the brain is usually performed to help identify a TIA. Other tests may include: °· Electrocardiography (ECG). °· Continuous heart monitoring. °· Echocardiography. °· Carotid ultrasonography. °· MRI. °· A scan of the brain circulation. °· Blood tests. ° °How is this treated? °Since the symptoms of TIA are the same as a stroke, it is important to seek treatment as soon as possible. You may need a medicine to dissolve a blood clot (thrombolytic) if that is the cause of the TIA. This medicine cannot be given if too much time has passed. Treatment may also include: °· Rest, oxygen, fluids through an IV tube, and medicines to thin the blood (anticoagulants). °· Measures will be taken to prevent short-term and long-term complications, including infection from breathing foreign material into the lungs (aspiration pneumonia), blood clots in the legs, and falls. °· Procedures to either remove plaque in the carotid arteries or dilate carotid arteries that have narrowed due to plaque. Those procedures are: °? Carotid endarterectomy. °? Carotid angioplasty and stenting. °· Medicines   and diet may be used to address diabetes, high blood pressure, and other underlying risk factors. ° °Follow these instructions at home: °· Take medicines only as directed by your health care provider. Follow the directions carefully. Medicines may be used to control risk factors for a stroke.  Be sure you understand all your medicine instructions. °· You may be told to take aspirin or the anticoagulant warfarin. Warfarin needs to be taken exactly as instructed. °? Taking too much or too little warfarin is dangerous. Too much warfarin increases the risk of bleeding. Too little warfarin continues to allow the risk for blood clots. While taking warfarin, you will need to have regular blood tests to measure your blood clotting time. A PT blood test measures how long it takes for blood to clot. Your PT is used to calculate another value called an INR. Your PT and INR help your health care provider to adjust your dose of warfarin. The dose can change for many reasons. It is critically important that you take warfarin exactly as prescribed. °? Many foods, especially foods high in vitamin K can interfere with warfarin and affect the PT and INR. Foods high in vitamin K include spinach, kale, broccoli, cabbage, collard and turnip greens, Brussels sprouts, peas, cauliflower, seaweed, and parsley, as well as beef and pork liver, green tea, and soybean oil. You should eat a consistent amount of foods high in vitamin K. Avoid major changes in your diet, or notify your health care provider before changing your diet. Arrange a visit with a dietitian to answer your questions. °? Many medicines can interfere with warfarin and affect the PT and INR. You must tell your health care provider about any and all medicines you take; this includes all vitamins and supplements. Be especially cautious with aspirin and anti-inflammatory medicines. Do not take or discontinue any prescribed or over-the-counter medicine except on the advice of your health care provider or pharmacist. °? Warfarin can have side effects, such as excessive bruising or bleeding. You will need to hold pressure over cuts for longer than usual. Your health care provider or pharmacist will discuss other potential side effects. °? Avoid sports or activities that  may cause injury or bleeding. °? Be careful when shaving, flossing your teeth, or handling sharp objects. °? Alcohol can change the body's ability to handle warfarin. It is best to avoid alcoholic drinks or consume only very small amounts while taking warfarin. Notify your health care provider if you change your alcohol intake. °? Notify your dentist or other health care providers before procedures. °· Eat a diet that includes 5 or more servings of fruits and vegetables each day. This may reduce the risk of stroke. Certain diets may be prescribed to address high blood pressure, high cholesterol, diabetes, or obesity. °? A diet low in sodium, saturated fat, trans fat, and cholesterol is recommended to manage high blood pressure. °? A diet low in saturated fat, trans fat, and cholesterol, and high in fiber may control cholesterol levels. °? A controlled-carbohydrate, controlled-sugar diet is recommended to manage diabetes. °? A reduced-calorie diet that is low in sodium, saturated fat, trans fat, and cholesterol is recommended to manage obesity. °· Maintain a healthy weight. °· Stay physically active. It is recommended that you get at least 30 minutes of activity on most or all days. °· Do not use any tobacco products, including cigarettes, chewing tobacco, or electronic cigarettes. If you need help quitting, ask your health care provider. °· Limit alcohol intake   to no more than 1 drink per day for nonpregnant women and 2 drinks per day for men. One drink equals 12 ounces of beer, 5 ounces of wine, or 1½ ounces of hard liquor. °· Do not abuse drugs. °· A safe home environment is important to reduce the risk of falls. Your health care provider may arrange for specialists to evaluate your home. Having grab bars in the bedroom and bathroom is often important. Your health care provider may arrange for equipment to be used at home, such as raised toilets and a seat for the shower. °· Follow all instructions for follow-up  with your health care provider. This is very important. This includes any referrals and lab tests. Proper follow-up can prevent a stroke or another TIA from occurring. °How is this prevented? °The risk of a TIA can be decreased by appropriately treating high blood pressure, high cholesterol, diabetes, heart disease, and obesity, and by quitting smoking, limiting alcohol, and staying physically active. °Contact a health care provider if: °· You have personality changes. °· You have difficulty swallowing. °· You are seeing double. °· You have dizziness. °· You have a fever. °Get help right away if: °Any of the following symptoms may represent a serious problem that is an emergency. Do not wait to see if the symptoms will go away. Get medical help right away. Call your local emergency services (911 in [place] Do not drive yourself to the hospital. °· You have sudden weakness or numbness of the face, arm, or leg, especially on one side of the body. °· You have sudden trouble walking or difficulty moving arms or legs. °· You have sudden confusion. °· You have trouble speaking (aphasia) or understanding. °· You have sudden trouble seeing in one or both eyes. °· You have a loss of balance or coordination. °· You have a sudden, severe headache with no known cause. °· You have new chest pain or an irregular heartbeat. °· You have a partial or total loss of consciousness. ° °This information is not intended to replace advice given to you by your health care provider. Make sure you discuss any questions you have with your health care provider. °Document Released: 03/07/2005 Document Revised: 01/30/2016 Document Reviewed: 09/02/2013 °Elsevier Interactive Patient Education © 2017 [employer] ° °

## 2016-11-13 NOTE — Progress Notes (Signed)
[place]     This very nice 83 y.o. WWF was admitted 5/29-5/30/2018  and presents for post hospital follow up  For suspected Lt brain TIA with an episode of Rt facial  Tingling numbness lasting less than 15 minutes. Head CT scan and MRI of Brain and Carotid Dopplers were all negative and Normal. Patient was discharged home to continue low dose bASA.  Patient was contacted post discharge by office staff to assure stability and schedule f/u.      Hospitalization discharge instructions and medications are reconciled with the patient.      Patient is also followed with Hypertension, Hyperlipidemia, Pre-Diabetes and Vitamin D Deficiency. Patient also has hypothyroidism and stable CKD3.      Patient is treated for HTN (1986)  & BP has been controlled at home. Today's BP is at goal - 122/60. Patient has had no complaints of any cardiac type chest pain, palpitations, dyspnea/orthopnea/PND, dizziness, claudication, or dependent edema.     Hyperlipidemia is controlled with diet & meds. Patient denies myalgias or other med SE's. Last Lipids were at goal in the hospital: Lab Results  Component Value Date   CHOL 129 11/07/2016   HDL 38 (L) 11/07/2016   LDLCALC 77 11/07/2016   TRIG 69 11/07/2016   CHOLHDL 3.4 11/07/2016      Also, the patient has history of PreDiabetes with A1c 5.7% in 2014 and has had no symptoms of reactive hypoglycemia, diabetic polys, paresthesias or visual blurring.  Last A1c was still borderline: Lab Results  Component Value Date   HGBA1C 5.7 (H) 09/18/2016      Further, the patient also has history of Vitamin D Deficiency and supplements vitamin D without any suspected side-effects. Last vitamin D was sl low (goal 70-100): Lab Results  Component Value Date   VD25OH 49 09/18/2016   Current Outpatient Prescriptions on File Prior to Visit  Medication Sig  . acetaminophen (TYLENOL) 325 MG tablet Take 325 mg by mouth every 6 (six) hours as needed.  Marland Kitchen aspirin  81 MG tablet Take 81 mg by mouth daily.    Marland Kitchen atenolol (TENORMIN) 50 MG tablet Take 1 tablet daily for BP and palpitations (Patient taking differently: Take 25 mg by mouth daily. Take 1 tablet daily for BP and palpitations)  . benazepril (LOTENSIN) 40 MG tablet Take 1 tablet (40 mg total) by mouth daily.  . Cholecalciferol (VITAMIN D3) 1000 units CAPS Take 1,000 Units by mouth 2 (two) times daily.  . clorazepate (TRANXENE-T) 7.5 MG tablet Take 1/2 to 1 tablet 2 to 3 x / day if needed for anxiety (Patient taking differently: Take 3.75-7.5 mg by mouth daily. Take 1/2 to 1 tablet 2 to 3 x / day if needed for anxiety)  . fluticasone (FLONASE) 50 MCG/ACT nasal spray Place 2 sprays into both nostrils at bedtime.  Marland Kitchen levothyroxine (SYNTHROID, LEVOTHROID) 150 MCG tablet TAKE 1 TABLET EVERY DAY  . Magnesium 100 MG CAPS Take 100 mg by mouth every morning.   . meclizine (ANTIVERT) 25 MG tablet 1/2-1 pill up to 3 times daily for motion sickness/dizziness (Patient taking differently: 12.5-25 mg. 1/2-1 pill up to 3 times daily for motion sickness/dizziness)   No current facility-administered medications on file prior to visit.    Allergies  Allergen Reactions  . Sulfa Antibiotics Other (See Comments)  . Asa [Aspirin] Other (See Comments)    Take 81mg  cannot take 325mg  gets nervous  . Codeine Nausea Only  . Prednisone Nausea And  Vomiting   PMHx:   Past Medical History:  Diagnosis Date  . Anemia   . CKD (chronic kidney disease) stage 3, GFR 30-59 ml/min    pt states she no longer has this  . Colon polyp   . Diverticulosis   . Hyperlipidemia   . Hyperlipidemia   . Hypertension   . Hypothyroidism   . Iron deficiency   . Prediabetes   . Prediabetes   . Small bowel obstruction ([place])    Immunization History  Administered Date(s) Administered  . DT 03/22/2015  . Influenza Split 03/04/2013  . Influenza, High Dose Seasonal PF 03/16/2014, 03/22/2015, 02/15/2016  . Pneumococcal Polysaccharide-23  03/16/2014   Past Surgical History:  Procedure Laterality Date  . CATARACT EXTRACTION    . EYE SURGERY    . MACULAR TEAR    . Vericose vein  1960   FHx:    Reviewed / unchanged  SHx:    Reviewed / unchanged  Systems Review:  Constitutional: Denies fever, chills, wt changes, headaches, insomnia, fatigue, night sweats, change in appetite. Eyes: Denies redness, blurred vision, diplopia, discharge, itchy, watery eyes.  ENT: Denies discharge, congestion, post nasal drip, epistaxis, sore throat, earache, hearing loss, dental pain, tinnitus, vertigo, sinus pain, snoring.  CV: Denies chest pain, palpitations, irregular heartbeat, syncope, dyspnea, diaphoresis, orthopnea, PND, claudication or edema. Respiratory: denies cough, dyspnea, DOE, pleurisy, hoarseness, laryngitis, wheezing.  Gastrointestinal: Denies dysphagia, odynophagia, heartburn, reflux, water brash, abdominal pain or cramps, nausea, vomiting, bloating, diarrhea, constipation, hematemesis, melena, hematochezia  or hemorrhoids. Genitourinary: Denies dysuria, frequency, urgency, nocturia, hesitancy, discharge, hematuria or flank pain. Musculoskeletal: Denies arthralgias, myalgias, stiffness, jt. swelling, pain, limping or strain/sprain.  Skin: Denies pruritus, rash, hives, warts, acne, eczema or change in skin lesion(s). Neuro: No weakness, tremor, incoordination, spasms, paresthesia or pain. Psychiatric: Denies confusion, memory loss or sensory loss. Endo: Denies change in weight, skin or hair change.  Heme/Lymph: No excessive bleeding, bruising or enlarged lymph nodes.  Physical Exam  BP 122/60   Pulse 60   Temp 97.6 F (36.4 C)   Resp 16   Ht 5' 2.5" (1.588 m)   Wt 164 lb 3.2 oz (74.5 kg)   BMI 29.55 kg/m   Appears well nourished, well groomed  and in no distress.  Eyes: PERRLA, EOMs, conjunctiva no swelling or erythema. Sinuses: No frontal/maxillary tenderness ENT/Mouth: EAC's clear, TM's nl w/o erythema, bulging.  Nares clear w/o erythema, swelling, exudates. Oropharynx clear without erythema or exudates. Oral hygiene is good. Tongue normal, non obstructing. Hearing intact.  Neck: Supple. Thyroid nl. Car 2+/2+ without bruits, nodes or JVD. Chest: Respirations nl with BS clear & equal w/o rales, rhonchi, wheezing or stridor.  Cor: Heart sounds normal w/ regular rate and rhythm without sig. murmurs, gallops, clicks or rubs. Peripheral pulses normal and equal  without edema.  Abdomen: Soft & bowel sounds normal. Non-tender w/o guarding, rebound, hernias, masses or organomegaly.  Lymphatics: Unremarkable.  Musculoskeletal: Full ROM all peripheral extremities, joint stability, 5/5 strength and normal gait.  Skin: Warm, dry without exposed rashes, lesions or ecchymosis apparent.  Neuro: Cranial nerves intact, reflexes equal bilaterally. Sensory-motor testing grossly intact. Tendon reflexes grossly intact.  Pysch: Alert & oriented x 3.  Insight and judgement nl & appropriate. No ideations.  Assessment and Plan:  1. Transient cerebral ischemia, hx   2. Essential hypertension  - Continue medication, monitor blood pressure at home.  - Continue DASH diet. Reminder to go to the ER if any CP,  SOB,  nausea, dizziness, severe HA, changes vision/speech,  left arm numbness and tingling and jaw pain. - CBC with Differential/Platelet - BASIC METABOLIC PANEL WITH GFR  3. Hypothyroidism   4. CKD Stage III (GFR 56 ml/min)  - CBC with Differential/Platelet - BASIC METABOLIC PANEL WITH GFR  5. Prediabetes  - Continue diet, exercise, lifestyle modifications.  - Monitor appropriate labs.  6. Vitamin D deficiency  - Continue supplementation.  7. Medication management  - CBC with Differential/Platelet - BASIC METABOLIC PANEL WITH GFR       Discussed  regular exercise, BP monitoring, weight control to achieve/maintain BMI less than 25 and discussed med and SE's. Recommended labs to assess and monitor  clinical status with further disposition pending results of labs. Over 30 minutes of exam, counseling, chart review was performed.

## 2016-11-14 LAB — BASIC METABOLIC PANEL WITH GFR
BUN: 21 mg/dL (ref 7–25)
Chloride: 104 mmol/L (ref 98–110)
GFR, Est African American: 56 mL/min — ABNORMAL LOW (ref >=60)
GFR, Est Non African American: 48 mL/min — ABNORMAL LOW (ref >=60)
Glucose, Bld: 92 mg/dL (ref 65–99)
Potassium: 4.3 mmol/L (ref 3.5–5.3)

## 2016-11-14 LAB — BASIC METABOLIC PANEL WITHOUT GFR
CO2: 24 mmol/L (ref 20–31)
Calcium: 8.8 mg/dL (ref 8.6–10.4)
Creat: 1.07 mg/dL — ABNORMAL HIGH (ref 0.60–0.88)
Sodium: 137 mmol/L (ref 135–146)

## 2016-11-27 ENCOUNTER — Other Ambulatory Visit: Payer: Self-pay | Admitting: Internal Medicine

## 2016-11-28 ENCOUNTER — Telehealth: Payer: Self-pay | Admitting: *Deleted

## 2016-11-28 NOTE — Telephone Encounter (Signed)
Patient call and reported her BP has been elevated at 159/94 and 167/93.  Per Dr Melford Aase, the patient should continue the Benazepril 40 mg and if she is taking Atenolol 50 mg 1/2 tablet, increase to 1 tablet daily. Patient was advised to call on 11/30/2016 with BP readings and sooner if needed.

## 2016-11-30 ENCOUNTER — Telehealth: Payer: Self-pay | Admitting: *Deleted

## 2016-11-30 MED ORDER — AMLODIPINE BESYLATE 10 MG PO TABS: ORAL_TABLET | ORAL | 1 refills | Status: DC

## 2016-11-30 NOTE — Telephone Encounter (Signed)
Patient called reported her BP is still elevated at 160/94.  Per Dr Melford Aase, continue the Benazepril 40 mg, Atenolol 50 mg and add an RX for Amlodipine 10 mg and take 1/2 tablet daily.  Patient is aware and will continue to check her BP.

## 2016-12-05 ENCOUNTER — Telehealth: Payer: Self-pay | Admitting: *Deleted

## 2016-12-05 NOTE — Telephone Encounter (Signed)
Patient called and reported her BP was 136/77 this AM and 118/74 this PM The patient takes Benazepril 40 mg and Amlodipine 10 mg 1/2 tablet in the AM and then Atenolol 50 mg in the PM. Per Dr Melford Aase, reduce the Benazepril 40 mg and the Atenolol 50 mg to 1/2 tablets and continue Amlodipine 10 mg at 1/2 tablet.  The patient is aware.

## 2016-12-20 ENCOUNTER — Ambulatory Visit (INDEPENDENT_AMBULATORY_CARE_PROVIDER_SITE_OTHER): Payer: Medicare HMO | Admitting: Physician Assistant

## 2016-12-20 ENCOUNTER — Encounter: Payer: Self-pay | Admitting: Physician Assistant

## 2016-12-20 VITALS — BP 120/80 | HR 68 | Temp 97.5°F | Resp 14 | Ht 62.5 in | Wt 163.8 lb

## 2016-12-20 DIAGNOSIS — D649 Anemia, unspecified: Secondary | ICD-10-CM

## 2016-12-20 DIAGNOSIS — R69 Illness, unspecified: Secondary | ICD-10-CM | POA: Diagnosis not present

## 2016-12-20 DIAGNOSIS — F419 Anxiety disorder, unspecified: Secondary | ICD-10-CM

## 2016-12-20 DIAGNOSIS — E538 Deficiency of other specified B group vitamins: Secondary | ICD-10-CM | POA: Diagnosis not present

## 2016-12-20 NOTE — Patient Instructions (Addendum)
Continue to drink plenty of fluids Continue thyroid med  If you have another feeling like shaking check BP and HR  Eat small frequent meals.      Panic Attacks Panic attacks are sudden, short-livedsurges of severe anxiety, fear, or discomfort. They may occur for no reason when you are relaxed, when you are anxious, or when you are sleeping. Panic attacks may occur for a number of reasons:  Healthy people occasionally have panic attacks in extreme, life-threatening situations, such as war or natural disasters. Normal anxiety is a protective mechanism of the body that helps [place] react to danger (fight or flight response).  Panic attacks are often seen with anxiety disorders, such as panic disorder, social anxiety disorder, generalized anxiety disorder, and phobias. Anxiety disorders cause excessive or uncontrollable anxiety. They may interfere with your relationships or other life activities.  Panic attacks are sometimes seen with other mental illnesses, such as depression and posttraumatic stress disorder.  Certain medical conditions, prescription medicines, and drugs of abuse can cause panic attacks.  What are the signs or symptoms? Panic attacks start suddenly, peak within 20 minutes, and are accompanied by four or more of the following symptoms:  Pounding heart or fast heart rate (palpitations).  Sweating.  Trembling or shaking.  Shortness of breath or feeling smothered.  Feeling choked.  Chest pain or discomfort.  Nausea or strange feeling in your stomach.  Dizziness, light-headedness, or feeling like you will faint.  Chills or hot flushes.  Numbness or tingling in your lips or hands and feet.  Feeling that things are not real or feeling that you are not yourself.  Fear of losing control or going crazy.  Fear of dying.  Some of these symptoms can mimic serious medical conditions. For example, you may think you are having a heart attack. Although panic attacks can  be very scary, they are not life threatening. How is this diagnosed? Panic attacks are diagnosed through an assessment by your health care provider. Your health care provider will ask questions about your symptoms, such as where and when they occurred. Your health care provider will also ask about your medical history and use of alcohol and drugs, including prescription medicines. Your health care provider may order blood tests or other studies to rule out a serious medical condition. Your health care provider may refer you to a mental health professional for further evaluation. How is this treated?  Most healthy people who have one or two panic attacks in an extreme, life-threatening situation will not require treatment.  The treatment for panic attacks associated with anxiety disorders or other mental illness typically involves counseling with a mental health professional, medicine, or a combination of both. Your health care provider will help determine what treatment is best for you.  Panic attacks due to physical illness usually go away with treatment of the illness. If prescription medicine is causing panic attacks, talk with your health care provider about stopping the medicine, decreasing the dose, or substituting another medicine.  Panic attacks due to alcohol or drug abuse go away with abstinence. Some adults need professional help in order to stop drinking or using drugs. Follow these instructions at home:  Take all medicines as directed by your health care provider.  Schedule and attend follow-up visits as directed by your health care provider. It is important to keep all your appointments. Contact a health care provider if:  You are not able to take your medicines as prescribed.  Your symptoms do not  improve or get worse. Get help right away if:  You experience panic attack symptoms that are different than your usual symptoms.  You have serious thoughts about hurting yourself or  others.  You are taking medicine for panic attacks and have a serious side effect. This information is not intended to replace advice given to you by your health care provider. Make sure you discuss any questions you have with your health care provider. Document Released: 05/28/2005 Document Revised: 11/03/2015 Document Reviewed: 01/09/2013 Elsevier Interactive Patient Education  2017 [employer].

## 2016-12-20 NOTE — Progress Notes (Signed)
Subjective:    Patient ID: Mikayla Harrison, female    DOB: 27-Nov-1931, 83 y.o.   MRN: [id number]  HPI 83 y.o. WF presents with anxiety x several months intermittently.  She has been having several months of randomly feeling anxious, example, it was 5 pm, she ate between 12 and 1-she was at the office [employer] copying things when she started to have trembling, cold, feels shaky/anxioius, no SOB, no sweating, no CP, no changes in speech. She went home covered up with a blanket and felt better.   Lab Results  Component Value Date   TSH 2.68 09/18/2016    Blood pressure 120/80, pulse 68, temperature (!) 97.5 F (36.4 C), resp. rate 14, height 5' 2.5" (1.588 m), weight 163 lb 12.8 oz (74.3 kg), SpO2 98 %.  Medications Current Outpatient Prescriptions on File Prior to Visit  Medication Sig  . acetaminophen (TYLENOL) 325 MG tablet Take 325 mg by mouth every 6 (six) hours as needed.  Marland Kitchen amLODipine (NORVASC) 10 MG tablet Take 1/2 to 1 tablet daily for BP.  Marland Kitchen aspirin 81 MG tablet Take 81 mg by mouth daily.    Marland Kitchen atenolol (TENORMIN) 50 MG tablet Take 1 tablet daily for BP and palpitations (Patient taking differently: Take 25 mg by mouth daily. Take 1 tablet daily for BP and palpitations)  . benazepril (LOTENSIN) 40 MG tablet Take 1 tablet (40 mg total) by mouth daily.  . Cholecalciferol (VITAMIN D3) 1000 units CAPS Take 1,000 Units by mouth 2 (two) times daily.  . clorazepate (TRANXENE-T) 7.5 MG tablet Take 1/2 to 1 tablet 2 to 3 x / day if needed for anxiety (Patient taking differently: Take 3.75-7.5 mg by mouth daily. Take 1/2 to 1 tablet 2 to 3 x / day if needed for anxiety)  . fluticasone (FLONASE) 50 MCG/ACT nasal spray Place 2 sprays into both nostrils at bedtime.  Marland Kitchen levothyroxine (SYNTHROID, LEVOTHROID) 150 MCG tablet TAKE 1 TABLET EVERY DAY  . Magnesium 100 MG CAPS Take 100 mg by mouth every morning.   . meclizine (ANTIVERT) 25 MG tablet 1/2-1 pill up to 3 times daily for motion sickness/dizziness  (Patient taking differently: 12.5-25 mg. 1/2-1 pill up to 3 times daily for motion sickness/dizziness)   No current facility-administered medications on file prior to visit.     Problem list She has Hypertension; Hypothyroidism; Prediabetes; Diverticulosis; Hyperlipidemia; Medication management; Vitamin D deficiency; CKD Stage III (GFR 56 ml/min); Unifocal PVCs; Morbid obesity ([place])  (BMI 30.41) ; Raynaud phenomenon; Encounter for Medicare annual wellness exam; and TIA (transient ischemic attack) on her problem list.   Review of Systems See HPI    Objective:   Physical Exam  Constitutional: She is oriented to person, place, and time. She appears well-developed and well-nourished. No distress.  HENT:  Head: Normocephalic and atraumatic.  Eyes: Pupils are equal, round, and reactive to light. Conjunctivae are normal.  Neck: Normal range of motion. Neck supple. No JVD present.  Cardiovascular: Normal rate, regular rhythm and intact distal pulses.   Occasional extrasystoles are present. PMI is not displaced.  Exam reveals no gallop and no friction rub.   Murmur: mild systolic click/murmur. Pulmonary/Chest: Breath sounds normal. No accessory muscle usage. No respiratory distress.  Abdominal: Soft. Normal appearance and bowel sounds are normal. She exhibits no abdominal bruit and no pulsatile midline mass. There is no tenderness.  Musculoskeletal: Edema: left leg with 2 + edema compared to right, negative homen's sign.  Lymphadenopathy:    She  has no cervical adenopathy.  Neurological: She is alert and oriented to person, place, and time. She has normal strength and normal reflexes. No cranial nerve deficit or sensory deficit. She exhibits normal muscle tone.  Skin: She is not diaphoretic. No cyanosis. Nails show no clubbing.       Assessment & Plan:   Anxiety Go to ER if any CP, SOB Check BP and HR next episode ? From anxiety due to brother being in poor health Declines meds at this  time -     Hepatic function panel -     BASIC METABOLIC PANEL WITH GFR -     TSH -     Iron and TIBC -     Ferritin -     Vitamin B12

## 2016-12-21 LAB — FERRITIN: Ferritin: 44 ng/mL (ref 20–288)

## 2016-12-21 LAB — HEPATIC FUNCTION PANEL
ALT: 11 U/L (ref 6–29)
AST: 19 U/L (ref 10–35)
Albumin: 3.8 g/dL (ref 3.6–5.1)
Alkaline Phosphatase: 63 U/L (ref 33–130)
Bilirubin, Direct: 0.1 mg/dL (ref <=0.2)
Indirect Bilirubin: 0.2 mg/dL (ref 0.2–1.2)
Total Bilirubin: 0.3 mg/dL (ref 0.2–1.2)
Total Protein: 6.4 g/dL (ref 6.1–8.1)

## 2016-12-21 LAB — BASIC METABOLIC PANEL WITHOUT GFR
BUN: 18 mg/dL (ref 7–25)
CO2: 24 mmol/L (ref 20–31)
Chloride: 101 mmol/L (ref 98–110)
Creat: 1.13 mg/dL — ABNORMAL HIGH (ref 0.60–0.88)

## 2016-12-21 LAB — IRON AND TIBC
%SAT: 20 % (ref 11–50)
Iron: 59 ug/dL (ref 45–160)
TIBC: 297 ug/dL (ref 250–450)
UIBC: 238 ug/dL

## 2016-12-21 LAB — TSH: TSH: 1.23 mIU/L

## 2016-12-21 LAB — BASIC METABOLIC PANEL WITH GFR
Calcium: 8.8 mg/dL (ref 8.6–10.4)
GFR, Est African American: 52 mL/min — ABNORMAL LOW (ref >=60)
GFR, Est Non African American: 45 mL/min — ABNORMAL LOW (ref >=60)
Glucose, Bld: 92 mg/dL (ref 65–99)
Potassium: 4.6 mmol/L (ref 3.5–5.3)
Sodium: 135 mmol/L (ref 135–146)

## 2016-12-21 LAB — VITAMIN B12: Vitamin B-12: 344 pg/mL (ref 200–1100)

## 2016-12-21 NOTE — Progress Notes (Signed)
Pt aware of lab results & voiced understanding of those results.

## 2016-12-25 ENCOUNTER — Other Ambulatory Visit: Payer: Self-pay | Admitting: Internal Medicine

## 2016-12-25 NOTE — Progress Notes (Signed)
MEDICARE ANNUAL WELLNESS VISIT AND FOLLOW UP  Assessment:   Essential hypertension - continue medications but cut verapamil in half, DASH diet, exercise and monitor at home. Call if greater than 130/80.  - CBC with Differential/Platelet   Prediabetes Discussed general issues about diabetes pathophysiology and management., Educational material distributed., Suggested low cholesterol diet., Encouraged aerobic exercise., Discussed foot care., Reminded to get yearly retinal exam. - [facility] DIABETES FOOT EXAM  CKD Stage III (GFR 56 ml/min) Increase fluids, avoid NSAIDS, monitor sugars, will monitor - BASIC METABOLIC PANEL WITH GFR  Hyperlipidemia -continue medications, check lipids, decrease fatty foods, increase activity.   Medication management - Magnesium   Vitamin D deficiency Continue supplement  Hypothyroidism, unspecified hypothyroidism type Hypothyroidism-check TSH level, continue medications the same, reminded to take on an empty stomach 30-80mins before food.   Unifocal PVCs Cut the verapamil in half, monitor foods, check labs   Diverticulosis of intestine without bleeding, unspecified intestinal tract location Controlled, diet   Raynaud phenomenon Declines meds at this time   Morbid obesity, unspecified obesity type ([place]) Obesity with co morbidities- long discussion about weight loss, diet, and exercise   Encounter for Medicare annual wellness exam  TIA Control blood pressure, cholesterol, glucose, increase exercise.  Continue ASA  Over 30 minutes of exam, counseling, chart review, and critical decision making was performed Future Appointments Date Time Provider [place]  04/02/2017 10:30 AM [name], MD [facility] None  10/10/2017 9:00 AM [name], MD [facility] None     Plan:   During the course of the visit the patient was educated and counseled about appropriate screening and preventive services including:    Pneumococcal vaccine    Influenza vaccine  Td vaccine  Screening electrocardiogram  Screening mammography  Bone densitometry screening  Colorectal cancer screening  Diabetes screening  Glaucoma screening  Nutrition counseling   Advanced directives: given info/requested   Subjective:   Mikayla Harrison is a 83 y.o. female who presents for Medicare Annual Wellness Visit and 3 month follow up on hypertension, prediabetes, hyperlipidemia, vitamin D def.   She lives by herself, has a lot of anxiety at night.   Her blood pressure has been controlled at home, today their BP is BP: 120/78.  She does workout. She denies chest pain, shortness of breath, dizziness.  She is not on cholesterol medication and denies myalgias. Her cholesterol is at goal. The cholesterol last visit was:   Lab Results  Component Value Date   CHOL 129 11/07/2016   HDL 38 (L) 11/07/2016   LDLCALC 77 11/07/2016   TRIG 69 11/07/2016   CHOLHDL 3.4 11/07/2016    She has CKD stage III due to HTN. Lab Results  Component Value Date   GFRNONAA 71 (L) 12/20/2016   She has been working on diet and exercise for prediabetes, and denies paresthesia of the feet, polydipsia, polyuria and visual disturbances. She is on bASA.  Last A1C in the office was:  Lab Results  Component Value Date   HGBA1C 5.7 (H) 09/18/2016   Patient is on Vitamin D supplement. Lab Results  Component Value Date   VD25OH 49 09/18/2016     She is on thyroid medication. Her medication was not changed last visit.   Lab Results  Component Value Date   TSH 1.23 12/20/2016   BMI is Body mass index is 29.52 kg/m., she is working on diet and exercise. Wt Readings from Last 3 Encounters:  12/26/16 164 lb (74.4 kg)  12/20/16 163  lb 12.8 oz (74.3 kg)  11/13/16 164 lb 3.2 oz (74.5 kg)    Medication Review Current Outpatient Prescriptions on File Prior to Visit  Medication Sig  . acetaminophen (TYLENOL) 325 MG tablet Take 325 mg by mouth every 6 (six) hours as  needed.  Marland Kitchen amLODipine (NORVASC) 10 MG tablet Take 1/2 to 1 tablet daily for BP.  Marland Kitchen aspirin 81 MG tablet Take 81 mg by mouth daily.    Marland Kitchen atenolol (TENORMIN) 50 MG tablet Take 1 tablet daily for BP and palpitations (Patient taking differently: Take 25 mg by mouth daily. Take 1 tablet daily for BP and palpitations)  . benazepril (LOTENSIN) 40 MG tablet Take 1 tablet (40 mg total) by mouth daily.  . Cholecalciferol (VITAMIN D3) 1000 units CAPS Take 1,000 Units by mouth 2 (two) times daily.  . clorazepate (TRANXENE-T) 7.5 MG tablet Take 1/2 to 1 tablet 2 to 3 x / day if needed for anxiety (Patient taking differently: Take 3.75-7.5 mg by mouth daily. Take 1/2 to 1 tablet 2 to 3 x / day if needed for anxiety)  . fluticasone (FLONASE) 50 MCG/ACT nasal spray Place 2 sprays into both nostrils at bedtime.  Marland Kitchen levothyroxine (SYNTHROID, LEVOTHROID) 150 MCG tablet TAKE 1 TABLET EVERY DAY  . Magnesium 100 MG CAPS Take 100 mg by mouth every morning.   . meclizine (ANTIVERT) 25 MG tablet 1/2-1 pill up to 3 times daily for motion sickness/dizziness (Patient taking differently: 12.5-25 mg. 1/2-1 pill up to 3 times daily for motion sickness/dizziness)   No current facility-administered medications on file prior to visit.     Current Problems (verified) Patient Active Problem List   Diagnosis Date Noted  . TIA (transient ischemic attack) 11/06/2016  . Encounter for Medicare annual wellness exam 09/27/2015  . Raynaud phenomenon 08/01/2015  . Morbid obesity ([place])  (BMI 30.41)  03/22/2015  . 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 CKD Stage III (GFR 56 ml/min) 03/16/2014  . Hyperlipidemia 10/05/2013  . Medication management 10/05/2013  . Vitamin D deficiency 10/05/2013  . Prediabetes   . Diverticulosis   . Hypertension 01/14/2012  . Hypothyroidism 01/14/2012    Screening Tests Immunization History  Administered Date(s) Administered  . DT 03/22/2015  . Influenza Split 03/04/2013  . Influenza, High Dose Seasonal PF  03/16/2014, 03/22/2015, 02/15/2016  . Pneumococcal Polysaccharide-23 03/16/2014    Preventative care: Last colonoscopy:  2004, will not get another EGD: 2012, Dr. Henrene Pastor Last mammogram: 2011, declines another and declines breast exam at this time.  Last pap smear/pelvic exam: remote DEXA:remote at Dr. Delanna Ahmadi office, willing to get  Prior vaccinations: TD or Tdap:2016 Influenza: 2017 Pneumococcal: 03/2014 Prevnar13: Due 03/2015 Shingles/Zostavax: declines  Names of Other Physician/Practitioners you currently use: 1. [facility]- here for primary care 2. Dr. Delman Cheadle, eye doctor, last visit 2 years 3. Dr. Derenda Mis, dentist, last visit 3 years ago 62. Dr. Henrene Pastor, GI Patient Care Team: [name], MD as PCP - General (Internal Medicine)  Allergies Allergies  Allergen Reactions  . Sulfa Antibiotics Other (See Comments)  . Asa [Aspirin] Other (See Comments)    Take 81mg  cannot take 325mg  gets nervous  . Codeine Nausea Only  . Prednisone Nausea And Vomiting    SURGICAL HISTORY She  has a past surgical history that includes Cataract extraction; Vericose vein (1960); MACULAR TEAR; and Eye surgery. FAMILY HISTORY Her family history includes Colon polyps in her sister; Diabetes in her sister; Heart disease in her brother, father, and  mother; Stroke in her brother, mother, and sister. SOCIAL HISTORY She  reports that she quit smoking about 51 years ago. She has a 15.00 pack-year smoking history. She has never used smokeless tobacco. She reports that she does not drink alcohol or use drugs.  MEDICARE WELLNESS OBJECTIVES: Physical activity: yard work and walking Depression/mood screen:   Depression screen PHQ 2/9 11/13/2016  Decreased Interest 0  Down, Depressed, Hopeless 0  PHQ - 2 Score 0   ADLs:  In your present state of health, do you have any difficulty performing the following activities: 11/13/2016 11/06/2016  Hearing? N N  Vision? N N   Difficulty concentrating or making decisions? N N  Walking or climbing stairs? N N  Dressing or bathing? N N  Doing errands, shopping? N N  Some recent data might be hidden    Cognitive Testing  Alert? Yes  Normal Appearance?Yes  Oriented to person? Yes  Place? Yes   Time? Yes  Recall of three objects?  Yes  Can perform simple calculations? Yes  Displays appropriate judgment?Yes  Can read the correct time from a watch face?Yes  EOL planning: Does Patient Have a Medical Advance Directive?: No Would patient like information on creating a medical advance directive?: Yes (ED - Information included in AVS) No, declined information   Objective:   Blood pressure 120/78, pulse 63, temperature (!) 97.5 F (36.4 C), resp. rate 16, height 5' 2.5" (1.588 m), weight 164 lb (74.4 kg), SpO2 99 %. Body mass index is 29.52 kg/m.  General appearance: alert, no distress, WD/WN,  female HEENT: normocephalic, sclerae anicteric, TMs pearly, nares patent, no discharge or erythema, pharynx normal Oral cavity: MMM, no lesions Neck: supple, no lymphadenopathy, no thyromegaly, no masses Heart: RRR, normal S1, S2, no murmurs Lungs: CTA bilaterally, no wheezes, rhonchi, or rales Abdomen: +bs, soft, non tender, non distended, no masses, no hepatomegaly, no splenomegaly Musculoskeletal: nontender, no swelling, no obvious deformity Extremities: no edema, no cyanosis, no clubbing Pulses: 2+ symmetric, upper and lower extremities, normal cap refill Neurological: alert, oriented x 3, CN2-12 intact, strength normal upper extremities and lower extremities, sensation normal throughout, DTRs 2+ throughout, no cerebellar signs, gait normal Psychiatric: normal affect, behavior normal, pleasant  Breast: defer Gyn: defer Rectal: defer   Medicare Attestation I have personally reviewed: The patient's medical and social history Their use of alcohol, tobacco or illicit drugs Their current medications and  supplements The patient's functional ability including ADLs,fall risks, home safety risks, cognitive, and hearing and visual impairment Diet and physical activities Evidence for depression or mood disorders  The patient's weight, height, BMI, and visual acuity have been recorded in the chart.  I have made referrals, counseling, and provided education to the patient based on review of the above and I have provided the patient with a written personalized care plan for preventive services.     Vicie Mutters, PA-C   12/26/2016

## 2016-12-26 ENCOUNTER — Encounter: Payer: Self-pay | Admitting: Physician Assistant

## 2016-12-26 ENCOUNTER — Ambulatory Visit (INDEPENDENT_AMBULATORY_CARE_PROVIDER_SITE_OTHER): Payer: Medicare HMO | Admitting: Physician Assistant

## 2016-12-26 VITALS — BP 120/78 | HR 63 | Temp 97.5°F | Resp 16 | Ht 62.5 in | Wt 164.0 lb

## 2016-12-26 DIAGNOSIS — R7303 Prediabetes: Secondary | ICD-10-CM | POA: Diagnosis not present

## 2016-12-26 DIAGNOSIS — I10 Essential (primary) hypertension: Secondary | ICD-10-CM | POA: Diagnosis not present

## 2016-12-26 DIAGNOSIS — E559 Vitamin D deficiency, unspecified: Secondary | ICD-10-CM | POA: Diagnosis not present

## 2016-12-26 DIAGNOSIS — Z79899 Other long term (current) drug therapy: Secondary | ICD-10-CM

## 2016-12-26 DIAGNOSIS — E782 Mixed hyperlipidemia: Secondary | ICD-10-CM

## 2016-12-26 DIAGNOSIS — R6889 Other general symptoms and signs: Secondary | ICD-10-CM | POA: Diagnosis not present

## 2016-12-26 DIAGNOSIS — E039 Hypothyroidism, unspecified: Secondary | ICD-10-CM | POA: Diagnosis not present

## 2016-12-26 DIAGNOSIS — K5790 Diverticulosis of intestine, part unspecified, without perforation or abscess without bleeding: Secondary | ICD-10-CM | POA: Diagnosis not present

## 2016-12-26 DIAGNOSIS — Z0000 Encounter for general adult medical examination without abnormal findings: Secondary | ICD-10-CM

## 2016-12-26 DIAGNOSIS — N183 Chronic kidney disease, stage 3 unspecified: Secondary | ICD-10-CM

## 2016-12-26 DIAGNOSIS — Z0001 Encounter for general adult medical examination with abnormal findings: Secondary | ICD-10-CM | POA: Diagnosis not present

## 2016-12-26 DIAGNOSIS — I7300 Raynaud's syndrome without gangrene: Secondary | ICD-10-CM | POA: Diagnosis not present

## 2016-12-26 DIAGNOSIS — G458 Other transient cerebral ischemic attacks and related syndromes: Secondary | ICD-10-CM | POA: Diagnosis not present

## 2016-12-26 DIAGNOSIS — I493 Ventricular premature depolarization: Secondary | ICD-10-CM | POA: Diagnosis not present

## 2016-12-26 NOTE — Patient Instructions (Addendum)
Cognitive behavioral therapy look it up, see if you would benefit from this.   Please monitor your blood pressure, as we get older our body can not respond to a low blood pressure as well as it did when we were younger, for this reason we want a bit higher of a blood pressure as you get older to avoid dizziness and fatigue which can lead to falls. [place] call if your blood pressure is consistently above 150/90.   Too low is if your BP is below 100/60, increase fluids and if any symptoms go to ER  Zantac/ratinidine try 1 or 2 at night to see if this helps GERD   Plantar Fasciitis Plantar fasciitis is a painful foot condition that affects the heel. It occurs when the band of tissue that connects the toes to the heel bone (plantar fascia) becomes irritated. This can happen after exercising too much or doing other repetitive activities (overuse injury). The pain from plantar fasciitis can range from mild irritation to severe pain that makes it difficult for you to walk or move. The pain is usually worse in the morning or after you have been sitting or lying down for a while. What are the causes? This condition may be caused by:  Standing for long periods of time.  Wearing shoes that do not fit.  Doing high-impact activities, including running, aerobics, and ballet.  Being overweight.  Having an abnormal way of walking (gait).  Having tight calf muscles.  Having high arches in your feet.  Starting a new athletic activity.  What are the signs or symptoms? The main symptom of this condition is heel pain. Other symptoms include:  Pain that gets worse after activity or exercise.  Pain that is worse in the morning or after resting.  Pain that goes away after you walk for a few minutes.  How is this diagnosed? This condition may be diagnosed based on your signs and symptoms. Your health care provider will also do a physical exam to check for:  A tender area on the bottom of your  foot.  A high arch in your foot.  Pain when you move your foot.  Difficulty moving your foot.  You may also need to have imaging studies to confirm the diagnosis. These can include:  X-rays.  Ultrasound.  MRI.  How is this treated? Treatment for plantar fasciitis depends on the severity of the condition. Your treatment may include:  Rest, ice, and over-the-counter pain medicines to manage your pain.  Exercises to stretch your calves and your plantar fascia.  A splint that holds your foot in a stretched, upward position while you sleep (night splint).  Physical therapy to relieve symptoms and prevent problems in the future.  Cortisone injections to relieve severe pain.  Extracorporeal shock wave therapy (ESWT) to stimulate damaged plantar fascia with electrical impulses. It is often used as a last resort before surgery.  Surgery, if other treatments have not worked after 12 months.  Follow these instructions at home:  Take medicines only as directed by your health care provider.  Avoid activities that cause pain.  Roll the bottom of your foot over a bag of ice or a bottle of cold water. Do this for 20 minutes, 3-4 times a day.  Perform simple stretches as directed by your health care provider.  Try wearing athletic shoes with air-sole or gel-sole cushions or soft shoe inserts.  Wear a night splint while sleeping, if directed by your health care provider.  Keep all follow-up appointments with your health care provider. How is this prevented?  Do not perform exercises or activities that cause heel pain.  Consider finding low-impact activities if you continue to have problems.  Lose weight if you need to. The best way to prevent plantar fasciitis is to avoid the activities that aggravate your plantar fascia. Contact a health care provider if:  Your symptoms do not go away after treatment with home care measures.  Your pain gets worse.  Your pain affects your  ability to move or do your daily activities. This information is not intended to replace advice given to you by your health care provider. Make sure you discuss any questions you have with your health care provider. Document Released: 02/20/2001 Document Revised: 10/31/2015 Document Reviewed: 04/07/2014 Elsevier Interactive Patient Education  [employer].

## 2017-01-04 ENCOUNTER — Other Ambulatory Visit: Payer: Self-pay | Admitting: *Deleted

## 2017-01-04 MED ORDER — MECLIZINE HCL 25 MG PO TABS: ORAL_TABLET | ORAL | 0 refills | Status: DC

## 2017-01-25 ENCOUNTER — Ambulatory Visit: Payer: Self-pay | Admitting: Internal Medicine

## 2017-01-27 ENCOUNTER — Other Ambulatory Visit: Payer: Self-pay | Admitting: Internal Medicine

## 2017-01-30 ENCOUNTER — Other Ambulatory Visit: Payer: Self-pay | Admitting: *Deleted

## 2017-01-30 MED ORDER — LEVOTHYROXINE SODIUM 150 MCG PO TABS: 150.0000 ug | ORAL_TABLET | Freq: Every day | ORAL | 1 refills | Status: DC

## 2017-02-05 ENCOUNTER — Other Ambulatory Visit: Payer: Self-pay | Admitting: Internal Medicine

## 2017-02-09 ENCOUNTER — Other Ambulatory Visit: Payer: Self-pay | Admitting: Internal Medicine

## 2017-02-09 DIAGNOSIS — I10 Essential (primary) hypertension: Secondary | ICD-10-CM

## 2017-02-09 MED ORDER — HYDROCHLOROTHIAZIDE 25 MG PO TABS: ORAL_TABLET | ORAL | 1 refills | Status: DC

## 2017-02-14 DIAGNOSIS — M25552 Pain in left hip: Secondary | ICD-10-CM | POA: Diagnosis not present

## 2017-02-20 ENCOUNTER — Other Ambulatory Visit: Payer: Self-pay | Admitting: *Deleted

## 2017-02-20 MED ORDER — CLORAZEPATE DIPOTASSIUM 7.5 MG PO TABS: ORAL_TABLET | ORAL | 1 refills | Status: DC

## 2017-02-21 ENCOUNTER — Other Ambulatory Visit: Payer: Self-pay | Admitting: Internal Medicine

## 2017-02-22 DIAGNOSIS — M8588 Other specified disorders of bone density and structure, other site: Secondary | ICD-10-CM | POA: Diagnosis not present

## 2017-02-23 ENCOUNTER — Other Ambulatory Visit: Payer: Self-pay | Admitting: Internal Medicine

## 2017-03-06 ENCOUNTER — Telehealth: Payer: Self-pay | Admitting: *Deleted

## 2017-03-06 NOTE — Telephone Encounter (Signed)
Patient called and states her BP is running around 140 to 120/70's and her pulse is mostly in the 50's.  Per Dr Melford Aase, as long as her pulse is in the 50's, it is OK.  The patient complained of feeling tired and sleepy all the time.  Per Dr Melford Aase, she can reduce her Atenolol 50 mg to 1/2 tablet at bedtime and continue her othert medications the same.

## 2017-03-18 ENCOUNTER — Other Ambulatory Visit: Payer: Self-pay | Admitting: Internal Medicine

## 2017-03-18 ENCOUNTER — Telehealth: Payer: Self-pay | Admitting: *Deleted

## 2017-03-18 ENCOUNTER — Other Ambulatory Visit: Payer: Self-pay | Admitting: *Deleted

## 2017-03-18 MED ORDER — AMLODIPINE BESYLATE 10 MG PO TABS: ORAL_TABLET | ORAL | 3 refills | Status: DC

## 2017-03-18 NOTE — Telephone Encounter (Signed)
Patient called and reported her BP is 136/90 and pulse is 63.  Per Dr Melford Aase, the readings are OK and she should continue her medications the same.

## 2017-03-19 ENCOUNTER — Other Ambulatory Visit: Payer: Self-pay | Admitting: Internal Medicine

## 2017-03-19 DIAGNOSIS — M25552 Pain in left hip: Secondary | ICD-10-CM | POA: Diagnosis not present

## 2017-03-25 DIAGNOSIS — R9389 Abnormal findings on diagnostic imaging of other specified body structures: Secondary | ICD-10-CM | POA: Diagnosis not present

## 2017-04-02 ENCOUNTER — Encounter: Payer: Self-pay | Admitting: Internal Medicine

## 2017-04-02 ENCOUNTER — Ambulatory Visit (INDEPENDENT_AMBULATORY_CARE_PROVIDER_SITE_OTHER): Payer: Medicare HMO | Admitting: Internal Medicine

## 2017-04-02 VITALS — BP 126/64 | HR 56 | Temp 97.0°F | Resp 16 | Ht 62.5 in | Wt 164.8 lb

## 2017-04-02 DIAGNOSIS — R7303 Prediabetes: Secondary | ICD-10-CM

## 2017-04-02 DIAGNOSIS — E559 Vitamin D deficiency, unspecified: Secondary | ICD-10-CM

## 2017-04-02 DIAGNOSIS — N183 Chronic kidney disease, stage 3 unspecified: Secondary | ICD-10-CM

## 2017-04-02 DIAGNOSIS — I10 Essential (primary) hypertension: Secondary | ICD-10-CM | POA: Diagnosis not present

## 2017-04-02 DIAGNOSIS — E782 Mixed hyperlipidemia: Secondary | ICD-10-CM | POA: Diagnosis not present

## 2017-04-02 DIAGNOSIS — E039 Hypothyroidism, unspecified: Secondary | ICD-10-CM

## 2017-04-02 DIAGNOSIS — Z79899 Other long term (current) drug therapy: Secondary | ICD-10-CM | POA: Diagnosis not present

## 2017-04-02 NOTE — Progress Notes (Signed)
This very nice 83 y.o. WWF presents for 6 month follow up with Hypertension, Hyperlipidemia, Pre-Diabetes and Vitamin D Deficiency.      Patient is treated for HTN (1986) & BP has been very labile at home prompting a # of after hours phone calls for reassurance. Patient admits her anxiety is her "biggist enemy" in raising her BP. Marland Kitchen Today's BP is at goal - 126/64. Patient has had no complaints of any cardiac type chest pain, palpitations, dyspnea / orthopnea / PND, dizziness, claudication, or dependent edema. In May, patient had an over night hospital observation with a negative Head/brain C T scan & MRI and neg Carotid Dopplers for a suspected TIA and she was d/c'd to home to continue LD bASA 81 mg/daily.     Hyperlipidemia is controlled with diet & meds. Patient denies myalgias or other med SE's. Last Lipids were at goal: Lab Results  Component Value Date   CHOL 129 11/07/2016   HDL 38 (L) 11/07/2016   LDLCALC 77 11/07/2016   TRIG 69 11/07/2016   CHOLHDL 3.4 11/07/2016      Also, the patient has history of PreDiabetes (A1c 5.7% / 2014 and 6.%  / 201% & 2016)  and has had no symptoms of reactive hypoglycemia, diabetic polys, paresthesias or visual blurring.  Last A1c was almost to goal: Lab Results  Component Value Date   HGBA1C 5.7 (H) 09/18/2016      Patient has been on thyroid replacement since 1993.Further, the patient also has history of Vitamin D Deficiency ("36" in 2009 and "29" in 2016)  and supplements vitamin D without any suspected side-effects. Last vitamin D was still sl low:   Lab Results  Component Value Date   VD25OH 49 09/18/2016   Current Outpatient Prescriptions on File Prior to Visit  Medication Sig  . acetaminophen (TYLENOL) 325 MG tablet Take 325 mg by mouth every 6 (six) hours as needed.  Marland Kitchen amLODipine (NORVASC) 10 MG tablet TAKE 1/2 TO 1 (ONE-HALF TO ONE) TABLET BY MOUTH ONCE DAILY FOR BLOOD PRESSURE  . aspirin 81 MG tablet Take 81 mg by mouth daily.    .  benazepril (LOTENSIN) 40 MG tablet Take 1 tablet (40 mg total) by mouth daily.  . Cholecalciferol (VITAMIN D3) 1000 units CAPS Take 1,000 Units by mouth 2 (two) times daily.  . clorazepate (TRANXENE) 7.5 MG tablet Take 1/2 to 1 tablet 2 to 3 times a day if needed for anxiety.  . hydrochlorothiazide (HYDRODIURIL) 25 MG tablet Take 1 tablet daily for BP and fluid  . levothyroxine (SYNTHROID, LEVOTHROID) 150 MCG tablet Take 1 tablet (150 mcg total) by mouth daily.  . Magnesium 100 MG CAPS Take 100 mg by mouth every morning.   . meclizine (ANTIVERT) 25 MG tablet 1/2-1 pill up to 3 times daily for motion sickness/dizziness  . atenolol (TENORMIN) 50 MG tablet Take 1 tablet daily for BP and palpitations (Patient taking differently: Take 25 mg by mouth daily. Take 1 tablet daily for BP and palpitations)   No current facility-administered medications on file prior to visit.    Allergies  Allergen Reactions  . Sulfa Antibiotics Other (See Comments)  . Asa [Aspirin] Other (See Comments)    Take 81mg  cannot take 325mg  gets nervous  . Codeine Nausea Only  . Prednisone Nausea And Vomiting   PMHx:   Past Medical History:  Diagnosis Date  . Anemia   . CKD (chronic kidney disease) stage 3, GFR 30-59 ml/min  pt states she no longer has this  . Colon polyp   . Diverticulosis   . Hyperlipidemia   . Hyperlipidemia   . Hypertension   . Hypothyroidism   . Iron deficiency   . Prediabetes   . Prediabetes   . Small bowel obstruction ([place])    Immunization History  Administered Date(s) Administered  . DT 03/22/2015  . Influenza Split 03/04/2013  . Influenza, High Dose Seasonal PF 03/16/2014, 03/22/2015, 02/15/2016  . Pneumococcal Polysaccharide-23 03/16/2014   Past Surgical History:  Procedure Laterality Date  . CATARACT EXTRACTION    . EYE SURGERY    . MACULAR TEAR    . Vericose vein  1960   FHx:    Reviewed / unchanged  SHx:    Reviewed / unchanged  Systems Review:  Constitutional:  Denies fever, chills, wt changes, headaches, insomnia, fatigue, night sweats, change in appetite. Eyes: Denies redness, blurred vision, diplopia, discharge, itchy, watery eyes.  ENT: Denies discharge, congestion, post nasal drip, epistaxis, sore throat, earache, hearing loss, dental pain, tinnitus, vertigo, sinus pain, snoring.  CV: Denies chest pain, palpitations, irregular heartbeat, syncope, dyspnea, diaphoresis, orthopnea, PND, claudication or edema. Respiratory: denies cough, dyspnea, DOE, pleurisy, hoarseness, laryngitis, wheezing.  Gastrointestinal: Denies dysphagia, odynophagia, heartburn, reflux, water brash, abdominal pain or cramps, nausea, vomiting, bloating, diarrhea, constipation, hematemesis, melena, hematochezia  or hemorrhoids. Genitourinary: Denies dysuria, frequency, urgency, nocturia, hesitancy, discharge, hematuria or flank pain. Musculoskeletal: Denies arthralgias, myalgias, stiffness, jt. swelling, pain, limping or strain/sprain.  Skin: Denies pruritus, rash, hives, warts, acne, eczema or change in skin lesion(s). Neuro: No weakness, tremor, incoordination, spasms, paresthesia or pain. Psychiatric: Denies confusion, memory loss or sensory loss. Endo: Denies change in weight, skin or hair change.  Heme/Lymph: No excessive bleeding, bruising or enlarged lymph nodes.  Physical Exam  BP 126/64   Pulse (!) 56   Temp (!) 97 F (36.1 C)   Resp 16   Ht 5' 2.5" (1.588 m)   Wt 164 lb 12.8 oz (74.8 kg)   BMI 29.66 kg/m   Appears well nourished, well groomed  and in no distress.  Eyes: PERRLA, EOMs, conjunctiva no swelling or erythema. Sinuses: No frontal/maxillary tenderness ENT/Mouth: EAC's clear, TM's nl w/o erythema, bulging. Nares clear w/o erythema, swelling, exudates. Oropharynx clear without erythema or exudates. Oral hygiene is good. Tongue normal, non obstructing. Hearing intact.  Neck: Supple. Thyroid nl. Car 2+/2+ without bruits, nodes or JVD. Chest:  Respirations nl with BS clear & equal w/o rales, rhonchi, wheezing or stridor.  Cor: Heart sounds normal w/ regular rate and rhythm without sig. murmurs, gallops, clicks or rubs. Peripheral pulses normal and equal  without edema.  Abdomen: Soft & bowel sounds normal. Non-tender w/o guarding, rebound, hernias, masses or organomegaly.  Lymphatics: Unremarkable.  Musculoskeletal: Full ROM all peripheral extremities, joint stability, 5/5 strength and normal gait.  Skin: Warm, dry without exposed rashes, lesions or ecchymosis apparent.  Neuro: Cranial nerves intact, reflexes equal bilaterally. Sensory-motor testing grossly intact. Tendon reflexes grossly intact.  Pysch: Alert & oriented x 3.  Insight and judgement nl & appropriate. No ideations.  Assessment and Plan:  1. Essential hypertension  - Continue medication, monitor blood pressure at home.  - Continue DASH diet. Reminder to go to the ER if any CP,  SOB, nausea, dizziness, severe HA, changes vision/speech.  - CBC with Differential/Platelet - BASIC METABOLIC PANEL WITH GFR - Magnesium - TSH  2. Hyperlipidemia, mixed  - Continue diet/meds, exercise,& lifestyle modifications.  - Continue  monitor periodic cholesterol/liver & renal functions   - Hepatic function panel - Lipid panel - TSH  3. Prediabetes  - Hemoglobin A1c - Insulin, random  4. Vitamin D deficiency  - Continue diet, exercise, lifestyle modifications.  - Monitor appropriate labs. - Continue supplementation. - VITAMIN D 25 Hydroxy   5. CKD Stage III (GFR 56 ml/min)  - BASIC METABOLIC PANEL WITH GFR  6. Hypothyroidism  - TSH  7. Medication management - CBC with Differential/Platelet - BASIC METABOLIC PANEL WITH GFR - Hepatic function panel - Magnesium - Lipid panel - TSH - Hemoglobin A1c - Insulin, random - VITAMIN D 25 Hydroxy        Discussed  regular exercise, BP monitoring, weight control to achieve/maintain BMI less than 25 and discussed  med and SE's. Recommended labs to assess and monitor clinical status with further disposition pending results of labs. Over 30 minutes of exam, counseling, chart review was performed.

## 2017-04-02 NOTE — Patient Instructions (Signed)

## 2017-04-03 LAB — LIPID PANEL
Cholesterol: 128 mg/dL (ref <200)
HDL: 41 mg/dL — ABNORMAL LOW (ref >50)
LDL Cholesterol (Calc): 71 mg/dL
Non-HDL Cholesterol (Calc): 87 mg/dL (calc) (ref <130)
Total CHOL/HDL Ratio: 3.1 (calc) (ref <5.0)
Triglycerides: 84 mg/dL (ref <150)

## 2017-04-03 LAB — HEPATIC FUNCTION PANEL
AG Ratio: 1.4 (calc) (ref 1.0–2.5)
ALT: 10 U/L (ref 6–29)
AST: 20 U/L (ref 10–35)
Albumin: 3.7 g/dL (ref 3.6–5.1)
Alkaline phosphatase (APISO): 56 U/L (ref 33–130)
Bilirubin, Direct: 0.1 mg/dL (ref 0.0–0.2)
Globulin: 2.7 g/dL (ref 1.9–3.7)
Indirect Bilirubin: 0.3 mg/dL (ref 0.2–1.2)
Total Bilirubin: 0.4 mg/dL (ref 0.2–1.2)
Total Protein: 6.4 g/dL (ref 6.1–8.1)

## 2017-04-03 LAB — BASIC METABOLIC PANEL WITH GFR
Creat: 1.02 mg/dL — ABNORMAL HIGH (ref 0.60–0.88)
GFR, Est African American: 59 mL/min/{1.73_m2} — ABNORMAL LOW (ref >=60)
GFR, Est Non African American: 51 mL/min/{1.73_m2} — ABNORMAL LOW (ref >=60)
Potassium: 4.5 mmol/L (ref 3.5–5.3)
Sodium: 134 mmol/L — ABNORMAL LOW (ref 135–146)

## 2017-04-03 LAB — CBC WITH DIFFERENTIAL/PLATELET
Basophils Absolute: 49 cells/uL (ref 0–200)
Basophils Relative: 0.7 %
Eosinophils Absolute: 182 cells/uL (ref 15–500)
Eosinophils Relative: 2.6 %
HCT: 29.7 % — ABNORMAL LOW (ref 35.0–45.0)
Hemoglobin: 10 g/dL — ABNORMAL LOW (ref 11.7–15.5)
Lymphs Abs: 1365 {cells}/uL (ref 850–3900)
MCH: 28.7 pg (ref 27.0–33.0)
MCHC: 33.7 g/dL (ref 32.0–36.0)
MCV: 85.3 fL (ref 80.0–100.0)
MPV: 10.8 fL (ref 7.5–12.5)
Monocytes Relative: 8.9 %
Neutro Abs: 4781 {cells}/uL (ref 1500–7800)
Neutrophils Relative %: 68.3 %
Platelets: 257 10*3/uL (ref 140–400)
RBC: 3.48 10*6/uL — ABNORMAL LOW (ref 3.80–5.10)
RDW: 12.9 % (ref 11.0–15.0)
Total Lymphocyte: 19.5 %
WBC mixed population: 623 {cells}/uL (ref 200–950)
WBC: 7 10*3/uL (ref 3.8–10.8)

## 2017-04-03 LAB — BASIC METABOLIC PANEL WITHOUT GFR
BUN/Creatinine Ratio: 17 (calc) (ref 6–22)
BUN: 17 mg/dL (ref 7–25)
CO2: 27 mmol/L (ref 20–32)
Calcium: 8.7 mg/dL (ref 8.6–10.4)
Chloride: 99 mmol/L (ref 98–110)
Glucose, Bld: 93 mg/dL (ref 65–99)

## 2017-04-03 LAB — HEMOGLOBIN A1C
Hgb A1c MFr Bld: 5.9 %{Hb} — ABNORMAL HIGH (ref <5.7)
Mean Plasma Glucose: 123 (calc)
eAG (mmol/L): 6.8 (calc)

## 2017-04-03 LAB — MAGNESIUM: Magnesium: 1.8 mg/dL (ref 1.5–2.5)

## 2017-04-03 LAB — TSH: TSH: 2.37 mIU/L (ref 0.40–4.50)

## 2017-04-03 LAB — INSULIN, RANDOM: Insulin: 11.4 u[IU]/mL (ref 2.0–19.6)

## 2017-04-03 LAB — VITAMIN D 25 HYDROXY (VIT D DEFICIENCY, FRACTURES): Vit D, 25-Hydroxy: 43 ng/mL (ref 30–100)

## 2017-04-16 ENCOUNTER — Other Ambulatory Visit: Payer: Self-pay | Admitting: *Deleted

## 2017-04-16 DIAGNOSIS — I10 Essential (primary) hypertension: Secondary | ICD-10-CM

## 2017-04-16 DIAGNOSIS — R002 Palpitations: Secondary | ICD-10-CM

## 2017-04-16 MED ORDER — ATENOLOL 50 MG PO TABS: ORAL_TABLET | ORAL | 1 refills | Status: DC

## 2017-04-23 ENCOUNTER — Other Ambulatory Visit: Payer: Self-pay | Admitting: Internal Medicine

## 2017-04-23 ENCOUNTER — Ambulatory Visit (INDEPENDENT_AMBULATORY_CARE_PROVIDER_SITE_OTHER): Payer: Medicare HMO

## 2017-04-23 DIAGNOSIS — Z23 Encounter for immunization: Secondary | ICD-10-CM | POA: Diagnosis not present

## 2017-04-23 MED ORDER — BENAZEPRIL HCL 40 MG PO TABS: ORAL_TABLET | ORAL | 1 refills | Status: DC

## 2017-04-24 ENCOUNTER — Other Ambulatory Visit: Payer: Self-pay | Admitting: Internal Medicine

## 2017-06-12 ENCOUNTER — Other Ambulatory Visit: Payer: Self-pay | Admitting: Internal Medicine

## 2017-06-15 ENCOUNTER — Other Ambulatory Visit: Payer: Self-pay | Admitting: Internal Medicine

## 2017-06-18 ENCOUNTER — Encounter: Payer: Self-pay | Admitting: Physician Assistant

## 2017-06-18 ENCOUNTER — Ambulatory Visit (INDEPENDENT_AMBULATORY_CARE_PROVIDER_SITE_OTHER): Payer: Medicare HMO | Admitting: Physician Assistant

## 2017-06-18 VITALS — BP 110/76 | HR 61 | Temp 97.5°F | Resp 16 | Ht 62.5 in | Wt 163.0 lb

## 2017-06-18 DIAGNOSIS — J0100 Acute maxillary sinusitis, unspecified: Secondary | ICD-10-CM

## 2017-06-18 MED ORDER — AZITHROMYCIN 250 MG PO TABS: ORAL_TABLET | ORAL | 1 refills | Status: AC

## 2017-06-18 MED ORDER — FLUTICASONE PROPIONATE 50 MCG/ACT NA SUSP: 2.0000 | Freq: Every day | NASAL | 1 refills | Status: DC

## 2017-06-18 NOTE — Progress Notes (Signed)
Subjective:    Patient ID: Mikayla Harrison, female    DOB: December 06, 1931, 84 y.o.   MRN: [id number]  HPI 84 y.o. WF with history of CKD, HTN, hypothyroidism presents with right ear pain x several weeks, last few days getting worse. He has had some dizziness, pain in right ear, pressure in ear.  Has had some chills but no fever.   Blood pressure 110/76, pulse 61, temperature (!) 97.5 F (36.4 C), resp. rate 16, height 5' 2.5" (1.588 m), weight 163 lb (73.9 kg), SpO2 98 %.   Medications Current Outpatient Medications on File Prior to Visit  Medication Sig  . acetaminophen (TYLENOL) 325 MG tablet Take 325 mg by mouth every 6 (six) hours as needed.  Marland Kitchen amLODipine (NORVASC) 10 MG tablet TAKE 1/2 TO 1 (ONE-HALF TO ONE) TABLET BY MOUTH ONCE DAILY FOR BLOOD PRESSURE  . aspirin 81 MG tablet Take 81 mg by mouth daily.    Marland Kitchen atenolol (TENORMIN) 50 MG tablet Take 1 tablet daily for BP and palpitations  . benazepril (LOTENSIN) 40 MG tablet TAKE 1 TABLET BY MOUTH ONCE DAILY  . Cholecalciferol (VITAMIN D3) 1000 units CAPS Take 1,000 Units by mouth 2 (two) times daily.  . clorazepate (TRANXENE) 7.5 MG tablet Take 1/2 to 1 tablet 2 to 3 times a day if needed for anxiety.  . hydrochlorothiazide (HYDRODIURIL) 25 MG tablet Take 1 tablet daily for BP and fluid  . levothyroxine (SYNTHROID, LEVOTHROID) 150 MCG tablet Take 1 tablet (150 mcg total) by mouth daily.  . Magnesium 100 MG CAPS Take 100 mg by mouth every morning.   . meclizine (ANTIVERT) 25 MG tablet 1/2-1 pill up to 3 times daily for motion sickness/dizziness   No current facility-administered medications on file prior to visit.     Problem list She has Hypertension; Hypothyroidism; Prediabetes; Diverticulosis; Hyperlipidemia; Medication management; Vitamin D deficiency; CKD Stage III (GFR 56 ml/min); Unifocal PVCs; Morbid obesity ([place])  (BMI 30.41) ; Raynaud phenomenon; Encounter for Medicare annual wellness exam; and TIA (transient ischemic attack) on  their problem list.  Review of Systems  Constitutional: Positive for chills. Negative for fatigue and fever.  HENT: Positive for ear pain, postnasal drip and sinus pressure. Negative for congestion, dental problem, drooling, ear discharge, facial swelling, hearing loss, mouth sores, nosebleeds, rhinorrhea, sneezing, sore throat, tinnitus and trouble swallowing.   Eyes: Negative.  Negative for visual disturbance.  Respiratory: Positive for cough. Negative for apnea, choking, chest tightness, shortness of breath, wheezing and stridor.   Cardiovascular: Negative.   Genitourinary: Negative.   Neurological: Positive for headaches. Negative for dizziness, tremors, seizures, syncope, facial asymmetry, speech difficulty, weakness, light-headedness and numbness.       Objective:   Physical Exam  Constitutional: She is oriented to person, place, and time. She appears well-developed and well-nourished.  HENT:  Right Ear: Hearing normal. There is tenderness. No mastoid tenderness. Tympanic membrane is injected and erythematous. Tympanic membrane is not perforated, not retracted and not bulging. A middle ear effusion is present.  Left Ear: Hearing and external ear normal. No mastoid tenderness. Tympanic membrane is not injected, not perforated, not erythematous, not retracted and not bulging.  No middle ear effusion.  Nose: Right sinus exhibits maxillary sinus tenderness. Left sinus exhibits maxillary sinus tenderness.  Mouth/Throat: Uvula is midline, oropharynx is clear and moist and mucous membranes are normal.  Eyes: Conjunctivae and EOM are normal. Pupils are equal, round, and reactive to light.  Neck: Neck supple.  Cardiovascular: Normal  rate and regular rhythm.  Pulmonary/Chest: Effort normal and breath sounds normal. No respiratory distress. She has no wheezes.  Abdominal: Soft. Bowel sounds are normal.  Musculoskeletal: Normal range of motion.  Lymphadenopathy:    She has no cervical  adenopathy.  Neurological: She is alert and oriented to person, place, and time.  Skin: Skin is warm and dry.       Assessment & Plan:    Acute non-recurrent maxillary sinusitis versus TMJ -     azithromycin (ZITHROMAX) 250 MG tablet; Take 2 tablets (500 mg) on  Day 1,  followed by 1 tablet (250 mg) once daily on Days 2 through 5. -     fluticasone (FLONASE) 50 MCG/ACT nasal spray; Place 2 sprays into both nostrils at bedtime.     Future Appointments  Date Time Provider [place]  07/09/2017  2:30 PM Liane Comber, NP [facility] None  10/10/2017  9:00 AM [name], MD [facility] None

## 2017-06-18 NOTE — Patient Instructions (Signed)
Your ears and sinuses are connected by the eustachian tube. When your sinuses are inflamed, this can close off the tube and cause fluid to collect in your middle ear. This can then cause dizziness, popping, clicking, ringing, and echoing in your ears. This is often NOT an infection and does NOT require antibiotics, it is caused by inflammation so the treatments help the inflammation. This can take a long time to get better so please be patient.  Here are things you can do to help with this: - Try the Flonase or Nasonex. Remember to spray each nostril twice towards the outer part of your eye.  Do not sniff but instead pinch your nose and tilt your head back to help the medicine get into your sinuses.  The best time to do this is at bedtime.Stop if you get blurred vision or nose bleeds.  -While drinking fluids, pinch and hold nose close and swallow, to help open eustachian tubes to drain fluid behind ear drums. -Please pick one of the over the counter allergy medications below and take it once daily for allergies.  It will also help with fluid behind ear drums. Claritin or loratadine cheapest but likely the weakest  Zyrtec or certizine at night because it can make you sleepy The strongest is allegra or fexafinadine  Cheapest at [employer], [employer], [employer] -can use decongestant over the counter, please do not use if you have high blood pressure or certain heart conditions.   if worsening HA, changes vision/speech, imbalance, weakness go to the ER    Otitis Media, Adult Otitis media occurs when there is inflammation and fluid in the middle ear. Your middle ear is a part of the ear that contains bones for hearing as well as air that helps send sounds to your brain. What are the causes? This condition is caused by a blockage in the eustachian tube. This tube drains fluid from the ear to the back of the nose (nasopharynx). A blockage in this tube can be caused by an object or by swelling (edema) in the tube.  Problems that can cause a blockage include:  A cold or other upper respiratory infection.  Allergies.  An irritant, such as tobacco smoke.  Enlarged adenoids. The adenoids are areas of soft tissue located high in the back of the throat, behind the nose and the roof of the mouth.  A mass in the nasopharynx.  Damage to the ear caused by pressure changes (barotrauma).  What are the signs or symptoms? Symptoms of this condition include:  Ear pain.  A fever.  Decreased hearing.  A headache.  Tiredness (lethargy).  Fluid leaking from the ear.  Ringing in the ear.  How is this diagnosed? This condition is diagnosed with a physical exam. During the exam your health care provider will use an instrument called an otoscope to look into your ear and check for redness, swelling, and fluid. He or she will also ask about your symptoms. Your health care provider may also order tests, such as:  A test to check the movement of the eardrum (pneumatic otoscopy). This test is done by squeezing a small amount of air into the ear.  A test that changes air pressure in the middle ear to check how well the eardrum moves and whether the eustachian tube is working (tympanogram).  How is this treated? This condition usually goes away on its own within 3-5 days. But if the condition is caused by a bacteria infection and does not go  away own its own, or keeps coming back, your health care provider may:  Prescribe antibiotic medicines to treat the infection.  Prescribe or recommend medicines to control pain.  Follow these instructions at home:  Take over-the-counter and prescription medicines only as told by your health care provider.  If you were prescribed an antibiotic medicine, take it as told by your health care provider. Do not stop taking the antibiotic even if you start to feel better.  Keep all follow-up visits as told by your health care provider. This is important. Contact a health  care provider if:  You have bleeding from your nose.  There is a lump on your neck.  You are not getting better in 5 days.  You feel worse instead of better. Get help right away if:  You have severe pain that is not controlled with medicine.  You have swelling, redness, or pain around your ear.  You have stiffness in your neck.  A part of your face is paralyzed.  The bone behind your ear (mastoid) is tender when you touch it.  You develop a severe headache. Summary  Otitis media is redness, soreness, and swelling of the middle ear.  This condition usually goes away on its own within 3-5 days.  If the problem does not go away in 3-5 days, your health care provider may prescribe or recommend medicines to treat your symptoms.  If you were prescribed an antibiotic medicine, take it as told by your health care provider. This information is not intended to replace advice given to you by your health care provider. Make sure you discuss any questions you have with your health care provider. Document Released: 03/02/2004 Document Revised: 05/18/2016 Document Reviewed: 05/18/2016 Elsevier Interactive Patient Education  [employer].

## 2017-06-27 ENCOUNTER — Other Ambulatory Visit: Payer: Self-pay | Admitting: Physician Assistant

## 2017-06-27 MED ORDER — CLORAZEPATE DIPOTASSIUM 7.5 MG PO TABS: ORAL_TABLET | ORAL | 1 refills | Status: DC

## 2017-06-27 NOTE — Progress Notes (Signed)
Future Appointments  Date Time Provider [place]  07/09/2017  2:30 PM Liane Comber, NP [facility] None  10/10/2017  9:00 AM [name], MD [facility] None

## 2017-07-04 ENCOUNTER — Telehealth: Payer: Self-pay | Admitting: *Deleted

## 2017-07-04 NOTE — Telephone Encounter (Signed)
Patient called and was concerned about her BP at 159/94 this afternoon.  Per Dr Melford Aase, the patient has an OV here on 07/09/2017 and should keep a watch on her BP until then, continue her medication the same and call back sooner if needed.

## 2017-07-08 NOTE — Progress Notes (Signed)
FOLLOW UP  Assessment and Plan:   Labile Hypertension Labile hypertension - defer atenolol until verify BP above 120/70 Check BPs and do not take AM medications if below 110/70 - if above 120/80 take 5 mg amlodipine Defer benazepril - may take 1/2 tab (20 mg) later in the day if significantly elevated Goal is to maintain BP between 110/60- 150/90 - discussed dangers of excessively low BP Will follow up in 2 days - patient to present with BP log which she forgot today Continue DASH diet.   Reminder to go to the ER if any CP, SOB, nausea, dizziness, severe HA, changes vision/speech, left arm numbness and tingling and jaw pain.  Cholesterol Currently at goal by lifestyle modification only Continue low cholesterol diet and exercise.  Check lipid panel.   Prediabetes Discussed risks of elevated glucose Continue diet and exercise.  Perform daily foot/skin check, notify office of any concerning changes.  Check A1C  Overweight Long discussion about weight loss, diet, and exercise Recommended diet heavy in fruits and veggies and low in animal meats, cheeses, and dairy products, appropriate calorie intake Discussed ideal weight for height  Will follow up in 3 months  Hypothyroidism continue medications the same reminded to take on an empty stomach 30-55mins before food.  check TSH level  Vitamin D Def At goal at last visit; continue supplementation to maintain goal of 70-100 Defer Vit D level  Continue diet and meds as discussed. Further disposition pending results of labs. Discussed med's effects and SE's.   Over 30 minutes of exam, counseling, chart review, and critical decision making was performed.   Future Appointments  Date Time Provider [place]  07/11/2017 10:00 AM Liane Comber, NP [facility] None  10/10/2017  9:00 AM [name], MD [facility] None     ----------------------------------------------------------------------------------------------------------------------  HPI 84 y.o. female  presents for 3 month follow up on hypertension, cholesterol, prediabetes, hypothyroid, weight and vitamin D deficiency.   BMI is Body mass index is 29.16 kg/m., she has been working on diet but has not been exercising. 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  Wt Readings from Last 3 Encounters:  07/09/17 162 lb (73.5 kg)  06/18/17 163 lb (73.9 kg)  04/02/17 164 lb 12.8 oz (74.8 kg)   Her blood pressure has been very labile at home - she checks multiple times daily and ranges from 100-160/60-90, today their BP is BP: (!) 104/52 Manual recheck by provider 104/62 She reports she has been taking benazepril 40 mg and amlodipine 5 mg in the AM, then atenolol 25 mg in the PM. She is also prescribed hctz 25 mg but has not been taking. She does have some ongoing dizziness which she has attributed to ongoing BPV of R ear. She denies headaches, vision changes, weakness. She seems to have excessive amount of anxiety related to having a stroke with high blood pressure due to family history.    She does not workout. She denies chest pain, shortness of breath, dizziness.   She is not on cholesterol medication and denies myalgias. Her cholesterol is at goal. The cholesterol last visit was:   Lab Results  Component Value Date   CHOL 128 04/02/2017   HDL 41 (L) 04/02/2017   LDLCALC 77 11/07/2016   TRIG 84 04/02/2017   CHOLHDL 3.1 04/02/2017    She has not been working on diet and exercise for prediabetes, and denies increased appetite, nausea, paresthesia of the feet,  polydipsia, polyuria, visual disturbances, vomiting and weight loss. Last A1C in the office was:  Lab Results  Component Value Date   HGBA1C 5.9 (H) 04/02/2017   Patient is on Vitamin D supplement but remained well below goal of 70 at last check:    Lab  Results  Component Value Date   VD25OH 43 04/02/2017     She is on thyroid medication. Her medication was not changed last visit.   Lab Results  Component Value Date   TSH 2.37 04/02/2017  .     Current Medications:  Current Outpatient Medications on File Prior to Visit  Medication Sig  . acetaminophen (TYLENOL) 325 MG tablet Take 325 mg by mouth every 6 (six) hours as needed.  Marland Kitchen amLODipine (NORVASC) 10 MG tablet TAKE 1/2 TO 1 (ONE-HALF TO ONE) TABLET BY MOUTH ONCE DAILY FOR BLOOD PRESSURE  . aspirin 81 MG tablet Take 81 mg by mouth daily.    Marland Kitchen atenolol (TENORMIN) 50 MG tablet Take 1 tablet daily for BP and palpitations  . benazepril (LOTENSIN) 40 MG tablet TAKE 1 TABLET BY MOUTH ONCE DAILY  . Cholecalciferol (VITAMIN D3) 1000 units CAPS Take 1,000 Units by mouth 2 (two) times daily.  . clorazepate (TRANXENE) 7.5 MG tablet Take 1/2 to 1 tablet 2 to 3 times a day if needed for anxiety.  . fluticasone (FLONASE) 50 MCG/ACT nasal spray Place 2 sprays into both nostrils at bedtime.  . hydrochlorothiazide (HYDRODIURIL) 25 MG tablet Take 1 tablet daily for BP and fluid  . levothyroxine (SYNTHROID, LEVOTHROID) 150 MCG tablet Take 1 tablet (150 mcg total) by mouth daily.  . Magnesium 100 MG CAPS Take 100 mg by mouth every morning.   . meclizine (ANTIVERT) 25 MG tablet 1/2-1 pill up to 3 times daily for motion sickness/dizziness   No current facility-administered medications on file prior to visit.      Allergies:  Allergies  Allergen Reactions  . Sulfa Antibiotics Other (See Comments)  . Asa [Aspirin] Other (See Comments)    Take 81mg  cannot take 325mg  gets nervous  . Codeine Nausea Only  . Prednisone Nausea And Vomiting     Medical History:  Past Medical History:  Diagnosis Date  . Anemia   . CKD (chronic kidney disease) stage 3, GFR 30-59 ml/min (HCC)    pt states she no longer has this  . Colon polyp   . Diverticulosis   . Hyperlipidemia   . Hyperlipidemia   .  Hypertension   . Hypothyroidism   . Iron deficiency   . Prediabetes   . Prediabetes   . Small bowel obstruction (HCC)    Family history- Reviewed and unchanged Social history- Reviewed and unchanged   Review of Systems:  ROS    Physical Exam: BP (!) 104/52   Pulse 60   Temp (!) 97.3 F (36.3 C)   Ht 5' 2.5" (1.588 m)   Wt 162 lb (73.5 kg)   SpO2 98%   BMI 29.16 kg/m  Wt Readings from Last 3 Encounters:  07/09/17 162 lb (73.5 kg)  06/18/17 163 lb (73.9 kg)  04/02/17 164 lb 12.8 oz (74.8 kg)   General Appearance: Well nourished, in no apparent distress. Eyes: PERRLA, EOMs, conjunctiva no swelling or erythema Sinuses: No Frontal/maxillary tenderness ENT/Mouth: Ext aud canals clear, TMs without erythema, bulging. No erythema, swelling, or exudate on post pharynx.  Tonsils not swollen or erythematous. Hearing normal.  Neck: Supple, thyroid normal.  Respiratory: Respiratory effort normal, BS equal  bilaterally without rales, rhonchi, wheezing or stridor.  Cardio: RRR with no MRGs. Brisk peripheral pulses without edema.  Abdomen: Soft, + BS.  Non tender, no guarding, rebound, hernias, masses. Lymphatics: Non tender without lymphadenopathy.  Musculoskeletal: Full ROM, 5/5 strength, Normal gait Skin: Warm, dry without rashes, lesions, ecchymosis.  Neuro: Cranial nerves intact. No cerebellar symptoms.  Psych: Awake and oriented X 3, normal affect, Insight and Judgment appropriate.    Izora Ribas, NP 5:42 PM [facility]

## 2017-07-09 ENCOUNTER — Encounter: Payer: Self-pay | Admitting: Adult Health

## 2017-07-09 ENCOUNTER — Ambulatory Visit (INDEPENDENT_AMBULATORY_CARE_PROVIDER_SITE_OTHER): Payer: Medicare HMO | Admitting: Adult Health

## 2017-07-09 VITALS — BP 104/52 | HR 60 | Temp 97.3°F | Ht 62.5 in | Wt 162.0 lb

## 2017-07-09 DIAGNOSIS — R7303 Prediabetes: Secondary | ICD-10-CM | POA: Diagnosis not present

## 2017-07-09 DIAGNOSIS — R0989 Other specified symptoms and signs involving the circulatory and respiratory systems: Secondary | ICD-10-CM

## 2017-07-09 DIAGNOSIS — E039 Hypothyroidism, unspecified: Secondary | ICD-10-CM

## 2017-07-09 DIAGNOSIS — N183 Chronic kidney disease, stage 3 unspecified: Secondary | ICD-10-CM

## 2017-07-09 DIAGNOSIS — E663 Overweight: Secondary | ICD-10-CM | POA: Diagnosis not present

## 2017-07-09 DIAGNOSIS — Z79899 Other long term (current) drug therapy: Secondary | ICD-10-CM

## 2017-07-09 DIAGNOSIS — E782 Mixed hyperlipidemia: Secondary | ICD-10-CM

## 2017-07-09 DIAGNOSIS — E559 Vitamin D deficiency, unspecified: Secondary | ICD-10-CM

## 2017-07-09 NOTE — Patient Instructions (Signed)
Please monitor your blood pressure, as we get older our body can not respond to a low blood pressure as well as it did when we were younger, for this reason we want a bit higher of a blood pressure as you get older to avoid dizziness and fatigue which can lead to falls. [place]ase call if your blood pressure is consistently above 150/90.   We want to keep your blood pressure above 110/60 -   Please defer your atenolol tonight - check Blood pressure before bedtime and taken 1/4 tab of atenolol only if above 120/70  If blood pressure is low (below 110/70) in the AM, only take 1/2 tab of amlodipine, skip benazepril, recheck BPs later in day, and can take 1/2-1 tab benazapril if running higher again.   Stop taking hydrochlorothiazide for now - may take if severely elevated blood pressures     Please monitor your blood pressure. If it is getting low AND you are having fatigue with exertion, dizziness we may need to cut your blood pressure medication down further. Please call the office if this is happening.  Hypotension As your heart beats, it forces blood through your body. This force is called blood pressure. If you have hypotension, you have low blood pressure. When your blood pressure is too low, you may not get enough blood to your brain. You may feel weak, feel lightheaded, have a fast heartbeat, or even pass out (faint). HOME CARE  Drink enough fluids to keep your pee (urine) clear or pale yellow.  Take all medicines as told by your doctor.  Get up slowly after sitting or lying down.  Wear support stockings as told by your doctor.  Maintain a healthy diet by including foods such as fruits, vegetables, nuts, whole grains, and lean meats. GET HELP IF:  You are throwing up (vomiting) or have watery poop (diarrhea).  You have a fever for more than 2-3 days.  You feel more thirsty than usual.  You feel weak and tired. GET HELP RIGHT AWAY IF:   You pass out (faint).  You have chest  pain or a fast or irregular heartbeat.  You lose feeling in part of your body.  You cannot move your arms or legs.  You have trouble speaking.  You get sweaty or feel lightheaded. MAKE SURE YOU:   Understand these instructions.  Will watch your condition.  Will get help right away if you are not doing well or get worse. Document Released: 08/22/2009 Document Revised: 01/28/2013 Document Reviewed: 11/28/2012 [facility] Patient Information 2015 [place], [place]. This information is not intended to replace advice given to you by your health care provider. Make sure you discuss any questions you have with your health care provider.

## 2017-07-10 LAB — BASIC METABOLIC PANEL WITHOUT GFR
BUN/Creatinine Ratio: 19 (calc) (ref 6–22)
BUN: 22 mg/dL (ref 7–25)
CO2: 28 mmol/L (ref 20–32)
Chloride: 98 mmol/L (ref 98–110)
Creat: 1.13 mg/dL — ABNORMAL HIGH (ref 0.60–0.88)
Glucose, Bld: 115 mg/dL — ABNORMAL HIGH (ref 65–99)
Sodium: 134 mmol/L — ABNORMAL LOW (ref 135–146)

## 2017-07-10 LAB — LIPID PANEL
Cholesterol: 160 mg/dL (ref <200)
HDL: 43 mg/dL — ABNORMAL LOW (ref >50)
LDL Cholesterol (Calc): 97 mg/dL (calc)
Non-HDL Cholesterol (Calc): 117 mg/dL (calc) (ref <130)
Total CHOL/HDL Ratio: 3.7 (calc) (ref <5.0)
Triglycerides: 102 mg/dL (ref <150)

## 2017-07-10 LAB — CBC WITH DIFFERENTIAL/PLATELET
Basophils Absolute: 83 cells/uL (ref 0–200)
Basophils Relative: 1.1 %
Eosinophils Absolute: 233 {cells}/uL (ref 15–500)
Eosinophils Relative: 3.1 %
HCT: 30.9 % — ABNORMAL LOW (ref 35.0–45.0)
Hemoglobin: 10.3 g/dL — ABNORMAL LOW (ref 11.7–15.5)
Lymphs Abs: 2033 cells/uL (ref 850–3900)
MCH: 28.1 pg (ref 27.0–33.0)
MCHC: 33.3 g/dL (ref 32.0–36.0)
MCV: 84.4 fL (ref 80.0–100.0)
MPV: 11 fL (ref 7.5–12.5)
Monocytes Relative: 9.8 %
Neutro Abs: 4418 {cells}/uL (ref 1500–7800)
Neutrophils Relative %: 58.9 %
Platelets: 257 10*3/uL (ref 140–400)
RBC: 3.66 10*6/uL — ABNORMAL LOW (ref 3.80–5.10)
RDW: 12.2 % (ref 11.0–15.0)
Total Lymphocyte: 27.1 %
WBC mixed population: 735 cells/uL (ref 200–950)
WBC: 7.5 10*3/uL (ref 3.8–10.8)

## 2017-07-10 LAB — BASIC METABOLIC PANEL WITH GFR
Calcium: 9.2 mg/dL (ref 8.6–10.4)
GFR, Est African American: 52 mL/min/{1.73_m2} — ABNORMAL LOW (ref >=60)
GFR, Est Non African American: 45 mL/min/{1.73_m2} — ABNORMAL LOW (ref >=60)
Potassium: 4.4 mmol/L (ref 3.5–5.3)

## 2017-07-10 LAB — HEPATIC FUNCTION PANEL
AG Ratio: 1.3 (calc) (ref 1.0–2.5)
ALT: 10 U/L (ref 6–29)
AST: 18 U/L (ref 10–35)
Albumin: 3.9 g/dL (ref 3.6–5.1)
Alkaline phosphatase (APISO): 72 U/L (ref 33–130)
Bilirubin, Direct: 0.1 mg/dL (ref 0.0–0.2)
Globulin: 3 g/dL (ref 1.9–3.7)
Indirect Bilirubin: 0.2 mg/dL (ref 0.2–1.2)
Total Bilirubin: 0.3 mg/dL (ref 0.2–1.2)
Total Protein: 6.9 g/dL (ref 6.1–8.1)

## 2017-07-10 LAB — HEMOGLOBIN A1C
Hgb A1c MFr Bld: 6 % of total Hgb — ABNORMAL HIGH (ref <5.7)
Mean Plasma Glucose: 126 (calc)
eAG (mmol/L): 7 (calc)

## 2017-07-10 LAB — TSH: TSH: 1.2 mIU/L (ref 0.40–4.50)

## 2017-07-10 LAB — VITAMIN D 25 HYDROXY (VIT D DEFICIENCY, FRACTURES): Vit D, 25-Hydroxy: 39 ng/mL (ref 30–100)

## 2017-07-10 NOTE — Progress Notes (Signed)
Assessment and Plan:  Labile hypertension Home BPs reflect appropriate BPs without hypotension or concerning hypertension.  Continue to hold hctz; continue 5 mg amlodipine, 20-40 mg benazapril, 25 mg atenolol Check BPs at midday for 1 week - report if any values below 110/60 Patient is extremely anxious about the possibility of stroke; discussed with Dr. Melford Harrison and recommended low dose SSRI; patient is adamant against starting daily medication - continue clorazepate as needed  Further disposition pending results of labs. Discussed med's effects and SE's.   Over 15 minutes of exam, counseling, chart review, and critical decision making was performed.   Future Appointments  Date Time Provider [place]  10/10/2017  9:00 AM [name], MD [facility] None    ------------------------------------------------------------------------------------------------------------------   HPI BP (!) 104/52   Pulse 73   Temp (!) 97.5 F (36.4 C)   Ht 5' 2.5" (1.588 m)   Wt 162 lb (73.5 kg)   SpO2 97%   BMI 29.16 kg/m   84 y.o.female presents for 2 day follow up for BP after hypotension noted at recent office visit with concerns for labile hypertension.  She has been taking amlodipine 5 mg and benazapril 40 mg in the AM, she has been taking 25 mg atenolol at night. She was previously prescribed hctz 25 mg but we advised her to stop this at her last visit. She presents with a BP log today; AM BPs range from 110-120s/60s-70s; PM Bps range from 130s-low 150s/70s-91.   Her BP was 104/52 today in office; manual recheck by provider 112/54. She reports she took her full dose of medications this AM despite previous advise to decrease benazapril dose should AM BP be low. She is extremely anxious about elevated blood pressures causing stroke - (+ family hx).  She is prescribed clonazepate PRN for anxiety, reports she has had episodes of hypertension related to anxiety about BP and stroke.   Today she  denies HA, dizziness, weakness, CP, dyspnea, palpitations or other concerns.   Past Medical History:  Diagnosis Date  . Anemia   . CKD (chronic kidney disease) stage 3, GFR 30-59 ml/min (HCC)    pt states she no longer has this  . Colon polyp   . Diverticulosis   . Hyperlipidemia   . Hyperlipidemia   . Hypertension   . Hypothyroidism   . Iron deficiency   . Prediabetes   . Prediabetes   . Small bowel obstruction (HCC)      Allergies  Allergen Reactions  . Sulfa Antibiotics Other (See Comments)  . Asa [Aspirin] Other (See Comments)    Take 81mg  cannot take 325mg  gets nervous  . Codeine Nausea Only  . Prednisone Nausea And Vomiting    Current Outpatient Medications on File Prior to Visit  Medication Sig  . acetaminophen (TYLENOL) 325 MG tablet Take 325 mg by mouth every 6 (six) hours as needed.  Marland Kitchen amLODipine (NORVASC) 10 MG tablet TAKE 1/2 TO 1 (ONE-HALF TO ONE) TABLET BY MOUTH ONCE DAILY FOR BLOOD PRESSURE  . aspirin 81 MG tablet Take 81 mg by mouth daily.    Marland Kitchen atenolol (TENORMIN) 50 MG tablet Take 1 tablet daily for BP and palpitations  . benazepril (LOTENSIN) 40 MG tablet TAKE 1 TABLET BY MOUTH ONCE DAILY  . Cholecalciferol (VITAMIN D3) 1000 units CAPS Take 1,000 Units by mouth 2 (two) times daily.  . clorazepate (TRANXENE) 7.5 MG tablet Take 1/2 to 1 tablet 2 to 3 times a day if needed for anxiety.  Marland Kitchen  fluticasone (FLONASE) 50 MCG/ACT nasal spray Place 2 sprays into both nostrils at bedtime.  . hydrochlorothiazide (HYDRODIURIL) 25 MG tablet Take 1 tablet daily for BP and fluid  . levothyroxine (SYNTHROID, LEVOTHROID) 150 MCG tablet Take 1 tablet (150 mcg total) by mouth daily.  . Magnesium 100 MG CAPS Take 100 mg by mouth every morning.   . meclizine (ANTIVERT) 25 MG tablet 1/2-1 pill up to 3 times daily for motion sickness/dizziness   No current facility-administered medications on file prior to visit.     ROS: all negative except above.   Physical Exam:  BP (!)  104/52   Pulse 73   Temp (!) 97.5 F (36.4 C)   Ht 5' 2.5" (1.588 m)   Wt 162 lb (73.5 kg)   SpO2 97%   BMI 29.16 kg/m   General Appearance: Well nourished, in no apparent distress. Neck: Supple.  Respiratory: Respiratory effort normal, BS equal bilaterally without rales, rhonchi, wheezing or stridor.  Cardio: RRR with no MRGs. Brisk peripheral pulses without edema.  Abdomen: Soft, + BS.   Musculoskeletal: Full ROM, 5/5 strength, normal gait.  Skin: Warm, dry without rashes, lesions, ecchymosis.  Neuro: Cranial nerves intact. Normal muscle tone, no cerebellar symptoms. Sensation intact.  Psych: Awake and oriented X 3, normal affect, Insight and Judgment appropriate.    Mikayla Ribas, NP 10:25 AM [facility]

## 2017-07-11 ENCOUNTER — Ambulatory Visit (INDEPENDENT_AMBULATORY_CARE_PROVIDER_SITE_OTHER): Payer: Medicare HMO | Admitting: Adult Health

## 2017-07-11 ENCOUNTER — Encounter: Payer: Self-pay | Admitting: Adult Health

## 2017-07-11 VITALS — BP 112/54 | HR 73 | Temp 97.5°F | Ht 62.5 in | Wt 162.0 lb

## 2017-07-11 DIAGNOSIS — F418 Other specified anxiety disorders: Secondary | ICD-10-CM

## 2017-07-11 DIAGNOSIS — R69 Illness, unspecified: Secondary | ICD-10-CM | POA: Diagnosis not present

## 2017-07-11 DIAGNOSIS — R0989 Other specified symptoms and signs involving the circulatory and respiratory systems: Secondary | ICD-10-CM | POA: Diagnosis not present

## 2017-07-11 DIAGNOSIS — R4589 Other symptoms and signs involving emotional state: Secondary | ICD-10-CM

## 2017-07-11 NOTE — Patient Instructions (Signed)
Please monitor your blood pressure, as we get older our body can not respond to a low blood pressure as well as it did when we were younger, for this reason we want a bit higher of a blood pressure as you get older to avoid dizziness and fatigue which can lead to falls. [place] call if your blood pressure is consistently above 150/90.    Monitor your blood pressure at home. Go to the ER if any CP, SOB, nausea, dizziness, severe HA, changes vision/speech  Goal BP:  For patients younger than 60: Goal BP < 140/90. For patients 60 and older: Goal BP < 150/90. For patients with diabetes: Goal BP < 140/90. Your most recent BP: BP: (!) 104/52   Take your medications faithfully as instructed. Maintain a healthy weight. Get at least 150 minutes of aerobic exercise per week. Minimize salt intake. Minimize alcohol intake  DASH Eating Plan DASH stands for "Dietary Approaches to Stop Hypertension." The DASH eating plan is a healthy eating plan that has been shown to reduce high blood pressure (hypertension). Additional health benefits may include reducing the risk of type 2 diabetes mellitus, heart disease, and stroke. The DASH eating plan may also help with weight loss. WHAT DO I NEED TO KNOW ABOUT THE DASH EATING PLAN? For the DASH eating plan, you will follow these general guidelines:  Choose foods with a percent daily value for sodium of less than 5% (as listed on the food label).  Use salt-free seasonings or herbs instead of table salt or sea salt.  Check with your health care provider or pharmacist before using salt substitutes.  Eat lower-sodium products, often labeled as "lower sodium" or "no salt added."  Eat fresh foods.  Eat more vegetables, fruits, and low-fat dairy products.  Choose whole grains. Look for the word "whole" as the first word in the ingredient list.  Choose fish and skinless chicken or [place] more often than red meat. Limit fish, poultry, and meat to 6 oz (170 g) each  day.  Limit sweets, desserts, sugars, and sugary drinks.  Choose heart-healthy fats.  Limit cheese to 1 oz (28 g) per day.  Eat more home-cooked food and less restaurant, buffet, and fast food.  Limit fried foods.  Cook foods using methods other than frying.  Limit canned vegetables. If you do use them, rinse them well to decrease the sodium.  When eating at a restaurant, ask that your food be prepared with less salt, or no salt if possible. WHAT FOODS CAN I EAT? Seek help from a dietitian for individual calorie needs. Grains Whole grain or whole wheat bread. Brown rice. Whole grain or whole wheat pasta. Quinoa, bulgur, and whole grain cereals. Low-sodium cereals. Corn or whole wheat flour tortillas. Whole grain cornbread. Whole grain crackers. Low-sodium crackers. Vegetables Fresh or frozen vegetables (raw, steamed, roasted, or grilled). Low-sodium or reduced-sodium tomato and vegetable juices. Low-sodium or reduced-sodium tomato sauce and paste. Low-sodium or reduced-sodium canned vegetables.  Fruits All fresh, canned (in natural juice), or frozen fruits. Meat and Other Protein Products Ground beef (85% or leaner), grass-fed beef, or beef trimmed of fat. Skinless chicken or [place]. Ground chicken or [place]. Pork trimmed of fat. All fish and seafood. Eggs. Dried beans, peas, or lentils. Unsalted nuts and seeds. Unsalted canned beans. Dairy Low-fat dairy products, such as skim or 1% milk, 2% or reduced-fat cheeses, low-fat ricotta or cottage cheese, or plain low-fat yogurt. Low-sodium or reduced-sodium cheeses. Fats and Oils Tub margarines without trans  fats. Light or reduced-fat mayonnaise and salad dressings (reduced sodium). Avocado. Safflower, olive, or canola oils. Natural peanut or almond butter. Other Unsalted popcorn and pretzels. The items listed above may not be a complete list of recommended foods or beverages. Contact your dietitian for more options. WHAT FOODS ARE NOT  RECOMMENDED? Grains White bread. White pasta. White rice. Refined cornbread. Bagels and croissants. Crackers that contain trans fat. Vegetables Creamed or fried vegetables. Vegetables in a cheese sauce. Regular canned vegetables. Regular canned tomato sauce and paste. Regular tomato and vegetable juices. Fruits Dried fruits. Canned fruit in light or heavy syrup. Fruit juice. Meat and Other Protein Products Fatty cuts of meat. Ribs, chicken wings, bacon, sausage, bologna, salami, chitterlings, fatback, hot dogs, bratwurst, and packaged luncheon meats. Salted nuts and seeds. Canned beans with salt. Dairy Whole or 2% milk, cream, half-and-half, and cream cheese. Whole-fat or sweetened yogurt. Full-fat cheeses or blue cheese. Nondairy creamers and whipped toppings. Processed cheese, cheese spreads, or cheese curds. Condiments Onion and garlic salt, seasoned salt, table salt, and sea salt. Canned and packaged gravies. Worcestershire sauce. Tartar sauce. Barbecue sauce. Teriyaki sauce. Soy sauce, including reduced sodium. Steak sauce. Fish sauce. Oyster sauce. Cocktail sauce. Horseradish. Ketchup and mustard. Meat flavorings and tenderizers. Bouillon cubes. Hot sauce. Tabasco sauce. Marinades. Taco seasonings. Relishes. Fats and Oils Butter, stick margarine, lard, shortening, ghee, and bacon fat. Coconut, palm kernel, or palm oils. Regular salad dressings. Other Pickles and olives. Salted popcorn and pretzels. The items listed above may not be a complete list of foods and beverages to avoid. Contact your dietitian for more information. WHERE CAN I FIND MORE INFORMATION? National Heart, [employer], and [employer] Institute: travelstabloid.com Document Released: 05/17/2011 Document Revised: 10/12/2013 Document Reviewed: 04/01/2013 [facility] Patient Information 2015 [place], [place]. This information is not intended to replace advice given to you by your health care provider. Make  sure you discuss any questions you have with your health care provider.

## 2017-07-24 ENCOUNTER — Encounter: Payer: Self-pay | Admitting: Adult Health

## 2017-07-24 ENCOUNTER — Ambulatory Visit (INDEPENDENT_AMBULATORY_CARE_PROVIDER_SITE_OTHER): Payer: Medicare HMO | Admitting: Adult Health

## 2017-07-24 VITALS — Temp 97.5°F | Ht 62.5 in | Wt 160.0 lb

## 2017-07-24 DIAGNOSIS — J00 Acute nasopharyngitis [common cold]: Secondary | ICD-10-CM | POA: Diagnosis not present

## 2017-07-24 DIAGNOSIS — R42 Dizziness and giddiness: Secondary | ICD-10-CM

## 2017-07-24 MED ORDER — AZITHROMYCIN 250 MG PO TABS: ORAL_TABLET | ORAL | 1 refills | Status: AC

## 2017-07-24 NOTE — Patient Instructions (Addendum)
Start on a daily allergy medication for 2-3 weeks, flonase daily, saline nasal irrigation.   Can do mucinex if secretions become very thick   HOW TO TREAT VIRAL COUGH AND COLD SYMPTOMS:  -Symptoms usually last at least 1 week with the worst symptoms being around day 4.  - colds usually start with a sore throat and end with a cough, and the cough can take 2 weeks to get better.  -No antibiotics are needed for colds, flu, sore throats, cough, bronchitis UNLESS symptoms are longer than 7 days OR if you are getting better then get drastically worse.  -There are a lot of combination medications (Dayquil, Nyquil, Vicks 44, tyelnol cold and sinus, ETC). Please look at the ingredients on the back so that you are treating the correct symptoms and not doubling up on medications/ingredients.    Medicines you can use  Nasal congestion  Little Remedies saline spray (aerosol/mist)- can try this, it is in the kids section - pseudoephedrine (Sudafed)- behind the counter, do not use if you have high blood pressure, medicine that have -D in them.  - phenylephrine (Sudafed PE) -Dextormethorphan + chlorpheniramine (Coridcidin HBP)- okay if you have high blood pressure -Oxymetazoline (Afrin) nasal spray- LIMIT to 3 days -Saline nasal spray -Neti pot (used distilled or bottled water)  Ear pain/congestion  -pseudoephedrine (sudafed) - Nasonex/flonase nasal spray  Fever  -Acetaminophen (Tyelnol) -Ibuprofen (Advil, motrin, aleve)  Sore Throat  -Acetaminophen (Tyelnol) -Ibuprofen (Advil, motrin, aleve) -Drink a lot of water -Gargle with salt water - Rest your voice (don't talk) -Throat sprays -Cough drops  Body Aches  -Acetaminophen (Tyelnol) -Ibuprofen (Advil, motrin, aleve)  Headache  -Acetaminophen (Tyelnol) -Ibuprofen (Advil, motrin, aleve) - Exedrin, Exedrin Migraine  Allergy symptoms (cough, sneeze, runny nose, itchy eyes) -Claritin or loratadine cheapest but likely the weakest   -Zyrtec or certizine at night because it can make you sleepy -The strongest is allegra or fexafinadine  Cheapest at [employer], [employer], [employer]  Cough  -Dextromethorphan (Delsym)- medicine that has DM in it -Guafenesin (Mucinex/Robitussin) - cough drops - drink lots of water  Chest Congestion  -Guafenesin (Mucinex/Robitussin)  Red Itchy Eyes  - Naphcon-A  Upset Stomach  - Bland diet (nothing spicy, greasy, fried, and high acid foods like tomatoes, oranges, berries) -OKAY- cereal, bread, soup, crackers, rice -Eat smaller more frequent meals -reduce caffeine, no alcohol -Loperamide (Imodium-AD) if diarrhea -Prevacid for heart burn  General health when sick  -Hydration -wash your hands frequently -keep surfaces clean -change pillow cases and sheets often -Get fresh air but do not exercise strenuously -Vitamin D, double up on it - Vitamin C -Zinc

## 2017-07-24 NOTE — Progress Notes (Signed)
Assessment and Plan:  Jillian was seen today for dizziness, facial pain and otalgia.  Diagnoses and all orders for this visit:  Acute nasopharyngitis Benign exam - Suggested symptomatic OTC remedies. Nasal saline spray for congestion. Nasal steroids, allergy pill Follow up as needed. -     azithromycin (ZITHROMAX) 250 MG tablet; Take 2 tablets (500 mg) on  Day 1,  followed by 1 tablet (250 mg) once daily on Days 2 through 5.  Dizziness      Appears related to baseline BPV, possibly exacerbated by inflammation related to URI      Declines prednisone, encouraged to utilize antivert as needed, should improve with resolution of URI symptoms  patient to go to ER if there is weakness, thunderclap headache, visual changes, or any concerning factors   Further disposition pending results of labs. Discussed med's effects and SE's.   Over 15 minutes of exam, counseling, chart review, and critical decision making was performed.   Future Appointments  Date Time Provider [place]  10/10/2017  9:00 AM [name], MD [facility] None    ------------------------------------------------------------------------------------------------------------------   HPI Temp (!) 97.5 F (36.4 C)   Ht 5' 2.5" (1.588 m)   Wt 160 lb (72.6 kg)   SpO2 99%   BMI 28.80 kg/m   84 y.o.female presents for "dizziness" - she reports on and off dizziness - "I think it's my ears." Reports dizziness when supine or sitting, not notably associated with going from sitting to standing. Sometimes elicited by moving head quickly - "room is spinning." She also c/o unilateral L sided facial pressure, mild nasal congestion, ear pressure worse on L x 3 days. She reports a mild cough as well, non-productive. She reports she was feeling "a bit poorly" on Monday (day 1). She denies fever/chills, headaches, vision changes, weakness, parasthesias/numbness.   She has not taken any medications for cold symptoms.   She is  prescribed antivert for ongoing BPV but has not felt that she needed it in several days.   Past Medical History:  Diagnosis Date  . Anemia   . CKD (chronic kidney disease) stage 3, GFR 30-59 ml/min (HCC)    pt states she no longer has this  . Colon polyp   . Diverticulosis   . Hyperlipidemia   . Hyperlipidemia   . Hypertension   . Hypothyroidism   . Iron deficiency   . Prediabetes   . Prediabetes   . Small bowel obstruction (HCC)      Allergies  Allergen Reactions  . Sulfa Antibiotics Other (See Comments)  . Asa [Aspirin] Other (See Comments)    Take 81mg  cannot take 325mg  gets nervous  . Codeine Nausea Only  . Prednisone Nausea And Vomiting    Current Outpatient Medications on File Prior to Visit  Medication Sig  . acetaminophen (TYLENOL) 325 MG tablet Take 325 mg by mouth every 6 (six) hours as needed.  Marland Kitchen amLODipine (NORVASC) 10 MG tablet TAKE 1/2 TO 1 (ONE-HALF TO ONE) TABLET BY MOUTH ONCE DAILY FOR BLOOD PRESSURE  . aspirin 81 MG tablet Take 81 mg by mouth daily.    Marland Kitchen atenolol (TENORMIN) 50 MG tablet Take 1 tablet daily for BP and palpitations (Patient taking differently: Take 1/2 tablet daily for BP and palpitations)  . benazepril (LOTENSIN) 40 MG tablet TAKE 1 TABLET BY MOUTH ONCE DAILY  . Cholecalciferol (VITAMIN D3) 1000 units CAPS Take 1,000 Units by mouth 2 (two) times daily.  . clorazepate (TRANXENE) 7.5 MG tablet Take 1/2  to 1 tablet 2 to 3 times a day if needed for anxiety.  . fluticasone (FLONASE) 50 MCG/ACT nasal spray Place 2 sprays into both nostrils at bedtime.  . hydrochlorothiazide (HYDRODIURIL) 25 MG tablet Take 1 tablet daily for BP and fluid  . levothyroxine (SYNTHROID, LEVOTHROID) 150 MCG tablet Take 1 tablet (150 mcg total) by mouth daily.  . Magnesium 100 MG CAPS Take 100 mg by mouth every morning.   . meclizine (ANTIVERT) 25 MG tablet 1/2-1 pill up to 3 times daily for motion sickness/dizziness   No current facility-administered medications on  file prior to visit.     ROS: Review of Systems  Constitutional: Negative for chills, diaphoresis, fever and malaise/fatigue.  HENT: Positive for congestion. Negative for ear discharge, ear pain, hearing loss, sinus pain, sore throat and tinnitus.   Eyes: Negative for blurred vision, pain, discharge and redness.  Respiratory: Positive for cough. Negative for hemoptysis, sputum production, shortness of breath, wheezing and stridor.   Cardiovascular: Negative for chest pain, palpitations and orthopnea.  Gastrointestinal: Negative for abdominal pain, diarrhea, nausea and vomiting.  Genitourinary: Negative.   Musculoskeletal: Negative for joint pain and myalgias.  Skin: Negative for rash.  Neurological: Positive for dizziness. Negative for tingling, sensory change, focal weakness, weakness and headaches.  Endo/Heme/Allergies: Negative for environmental allergies.  Psychiatric/Behavioral: Negative.   All other systems reviewed and are negative.    Physical Exam:  Temp (!) 97.5 F (36.4 C)   Ht 5' 2.5" (1.588 m)   Wt 160 lb (72.6 kg)   SpO2 99%   BMI 28.80 kg/m   General Appearance: Well nourished, in no apparent distress. Eyes: PERRLA, EOMs, conjunctiva no swelling or erythema Sinuses: No Frontal/maxillary tenderness ENT/Mouth: Ext aud canals clear, TMs without erythema, bulging. No erythema, swelling, or exudate on post pharynx.  Tonsils not swollen or erythematous. Hearing normal.  Neck: Supple.  Respiratory: Respiratory effort normal, BS equal bilaterally without rales, rhonchi, wheezing or stridor.  Cardio: RRR with no MRGs. Brisk peripheral pulses without edema.  Abdomen: Soft, + BS.  Non tender, no guarding, rebound, hernias, masses. Lymphatics: Non tender without lymphadenopathy.  Musculoskeletal: Full ROM, 5/5 strength, normal gait.  Skin: Warm, dry without rashes, lesions, ecchymosis.  Neuro: Cranial nerves intact. Normal muscle tone, no cerebellar symptoms. Sensation  intact.  Psych: Awake and oriented X 3, normal affect, Insight and Judgment appropriate.     Izora Ribas, NP 3:52 PM [facility]

## 2017-07-29 ENCOUNTER — Encounter: Payer: Self-pay | Admitting: Internal Medicine

## 2017-07-31 ENCOUNTER — Other Ambulatory Visit: Payer: Self-pay | Admitting: Internal Medicine

## 2017-08-08 ENCOUNTER — Ambulatory Visit (INDEPENDENT_AMBULATORY_CARE_PROVIDER_SITE_OTHER): Payer: Medicare HMO | Admitting: Adult Health

## 2017-08-08 ENCOUNTER — Encounter: Payer: Self-pay | Admitting: Adult Health

## 2017-08-08 VITALS — BP 102/54 | HR 61 | Temp 97.9°F | Ht 62.5 in | Wt 162.0 lb

## 2017-08-08 DIAGNOSIS — H65193 Other acute nonsuppurative otitis media, bilateral: Secondary | ICD-10-CM | POA: Diagnosis not present

## 2017-08-08 DIAGNOSIS — J302 Other seasonal allergic rhinitis: Secondary | ICD-10-CM | POA: Diagnosis not present

## 2017-08-08 MED ORDER — PROMETHAZINE-DM 6.25-15 MG/5ML PO SYRP: 5.0000 mL | ORAL_SOLUTION | Freq: Four times a day (QID) | ORAL | 1 refills | Status: DC | PRN

## 2017-08-08 MED ORDER — AZITHROMYCIN 250 MG PO TABS: ORAL_TABLET | ORAL | 1 refills | Status: AC

## 2017-08-08 NOTE — Patient Instructions (Addendum)
Start allergy medication daily - zyrtec or allegra or generic equivalent daily, restart flonase daily.   Saline nasal irrigations for congestion.      Try the melatonin 5mg -20mg  dissolvable or gummy 30 mins before bed  Tumeric + bioprene - a natural antiinflammatory supplement that might help with chest wall pain     11 Tips to Follow:  1. No caffeine after 3pm: Avoid beverages with caffeine (soda, tea, energy drinks, etc.) especially after 3pm. 2. Don't go to bed hungry: Have your evening meal at least 3 hrs. before going to sleep. It's fine to have a small bedtime snack such as a glass of milk and a few crackers but don't have a big meal. 3. Have a nightly routine before bed: Plan on "winding down" before you go to sleep. Begin relaxing about 1 hour before you go to bed. Try doing a quiet activity such as listening to calming music, reading a book or meditating. 4. Turn off the TV and ALL electronics including video games, tablets, laptops, etc. 1 hour before sleep, and keep them out of the bedroom. 5. Turn off your cell phone and all notifications (new email and text alerts) or even better, leave your phone outside your room while you sleep. Studies have shown that a part of your brain continues to respond to certain lights and sounds even while you're still asleep. 6. Make your bedroom quiet, dark and cool. If you can't control the noise, try wearing earplugs or using a fan to block out other sounds. 7. Practice relaxation techniques. Try reading a book or meditating or drain your brain by writing a list of what you need to do the next day. 8. Don't nap unless you feel sick: you'll have a better night's sleep. 9. Don't smoke, or quit if you do. Nicotine, alcohol, and marijuana can all keep you awake. Talk to your health care provider if you need help with substance use. 10. Most importantly, wake up at the same time every day (or within 1 hour of your usual wake up time) EVEN on the  weekends. A regular wake up time promotes sleep hygiene and prevents sleep problems. 11. Reduce exposure to bright light in the last three hours of the day before going to sleep. Maintaining good sleep hygiene and having good sleep habits lower your risk of developing sleep problems. Getting better sleep can also improve your concentration and alertness. Try the simple steps in this guide. If you still have trouble getting enough rest, make an appointment with your health care provider.   Common causes of cough OR hoarseness OR sore throat:   Allergies, Viral Infections, Acid Reflux and Bacterial Infections.   Allergies and viral infections cause a cough OR sore throat by post nasal drip and are often worse at night, can also have sneezing, lower grade fevers, clear/yellow mucus. This is best treated with allergy medications or nasal sprays.  Please get on allegra for 1-2 weeks The strongest is allegra or fexafinadine  Cheapest at [employer], [employer], [employer]   Bacterial infections are more severe than allergies or viral infections with fever, teeth pain, fatigue. This can be treated with prednisone and the same over the counter medication and after 7 days can be treated with an antibiotic.   Silent reflux/GERD can cause a cough OR sore throat OR hoarseness WITHOUT heart burn because the esophagus that goes to the stomach and trachea that goes to the lungs are very close and when you lay down the  acid can irritate your throat and lungs. This can cause hoarseness, cough, and wheezing. Please stop any alcohol or anti-inflammatories like aleve/advil/ibuprofen and start an over the counter Prilosec or omeprazole 1-2 times daily 66mins before food for 2 weeks, then switch to over the counter zantac/ratinidine or pepcid/famotadine once at night for 2 weeks.    sometimes irritation causes more irritation. Try voice rest, use sugar free cough drops to prevent coughing, and try to stop clearing your throat.    If you ever have a cough that does not go away after trying these things please make a follow up visit for further evaluation or we can refer you to a specialist. Or if you ever have shortness of breath or chest pain go to the ER.

## 2017-08-08 NOTE — Progress Notes (Signed)
Assessment and Plan:  Mikayla Harrison was seen today for otalgia and cough.  Diagnoses and all orders for this visit:  Seasonal allergic rhinitis, unspecified trigger Symptoms suggestive of allergies + possibly a mild virus- Discussed the importance of avoiding unnecessary antibiotic therapy. Suggested symptomatic OTC remedies. Nasal saline spray for congestion. Nasal steroids, allergy pill, declines oral steroids Follow up as needed. -     promethazine-dextromethorphan (PROMETHAZINE-DM) 6.25-15 MG/5ML syrup; Take 5 mLs by mouth 4 (four) times daily as needed for cough.  Acute effusion of bilateral ears Mild, discussed will resolve spontaneously with patience and flonase daily for several weeks. Not suggestive of infectious process. She declines oral steroids today.   Other orders Take if develops a fever, cough is more productive, sensation of chest congestion -     azithromycin (ZITHROMAX) 250 MG tablet; Take 2 tablets (500 mg) on  Day 1,  followed by 1 tablet (250 mg) once daily on Days 2 through 5.  Further disposition pending results of labs. Discussed med's effects and SE's.   Over 15 minutes of exam, counseling, chart review, and critical decision making was performed.   Future Appointments  Date Time Provider [place]  10/10/2017  9:00 AM [name], MD [facility] None    ------------------------------------------------------------------------------------------------------------------   HPI BP (!) 102/54   Pulse 61   Temp 97.9 F (36.6 C)   Ht 5' 2.5" (1.588 m)   Wt 162 lb (73.5 kg)   SpO2 96%   BMI 29.16 kg/m   84 y.o.female presents for mildly productive cough x 3 weeks, much worse in the last few days. She also reports she has generally been feeling "really bad." She endorses mild headache, congestion, sneezing, mild sore throat from coughing. She reports ongoing chest wall pain that is worse with the recent coughing. She also has "deep" ear pressure/pain that  has been worse in the past week. Has ongoing intermittent dizziness with changing positions of her head.   Denies fever/chills.  She denies nausea/vomiting/abdominal pain. Denies chest pain/pressure/dyspnea/palpitations. She does not have any significant respiratory hx; has had flu vaccine this year. No known sick contacts. Not a smoker.   Past Medical History:  Diagnosis Date  . Anemia   . CKD (chronic kidney disease) stage 3, GFR 30-59 ml/min (HCC)    pt states she no longer has this  . Colon polyp   . Diverticulosis   . Hyperlipidemia   . Hyperlipidemia   . Hypertension   . Hypothyroidism   . Iron deficiency   . Prediabetes   . Prediabetes   . Small bowel obstruction (HCC)      Allergies  Allergen Reactions  . Sulfa Antibiotics Other (See Comments)  . Asa [Aspirin] Other (See Comments)    Take 81mg  cannot take 325mg  gets nervous  . Codeine Nausea Only  . Prednisone Nausea And Vomiting    Current Outpatient Medications on File Prior to Visit  Medication Sig  . acetaminophen (TYLENOL) 325 MG tablet Take 325 mg by mouth every 6 (six) hours as needed.  Marland Kitchen amLODipine (NORVASC) 10 MG tablet TAKE 1/2 TO 1 (ONE-HALF TO ONE) TABLET BY MOUTH ONCE DAILY FOR BLOOD PRESSURE  . aspirin 81 MG tablet Take 81 mg by mouth daily.    Marland Kitchen atenolol (TENORMIN) 50 MG tablet Take 1 tablet daily for BP and palpitations (Patient taking differently: Take 1/2 tablet daily for BP and palpitations)  . benazepril (LOTENSIN) 40 MG tablet TAKE 1 TABLET BY MOUTH ONCE DAILY  .  Cholecalciferol (VITAMIN D3) 1000 units CAPS Take 1,000 Units by mouth 2 (two) times daily.  . clorazepate (TRANXENE) 7.5 MG tablet Take 1/2 to 1 tablet 2 to 3 times a day if needed for anxiety.  . fluticasone (FLONASE) 50 MCG/ACT nasal spray Place 2 sprays into both nostrils at bedtime.  Marland Kitchen levothyroxine (SYNTHROID, LEVOTHROID) 150 MCG tablet Take 1 tablet (150 mcg total) by mouth daily.  . Magnesium 100 MG CAPS Take 100 mg by mouth every  morning.   . meclizine (ANTIVERT) 25 MG tablet 1/2-1 pill up to 3 times daily for motion sickness/dizziness  . hydrochlorothiazide (HYDRODIURIL) 25 MG tablet Take 1 tablet daily for BP and fluid (Patient not taking: Reported on 08/08/2017)   No current facility-administered medications on file prior to visit.     ROS: Review of Systems  Constitutional: Positive for malaise/fatigue. Negative for chills, diaphoresis and fever.  HENT: Positive for congestion, ear pain ("pressure") and sore throat (MIld). Negative for ear discharge, hearing loss, sinus pain and tinnitus.   Eyes: Negative for blurred vision, pain, discharge and redness.  Respiratory: Positive for cough. Negative for hemoptysis, sputum production, shortness of breath, wheezing and stridor.   Cardiovascular: Negative for chest pain, palpitations, orthopnea, leg swelling and PND.  Gastrointestinal: Negative for abdominal pain, diarrhea, nausea and vomiting.  Genitourinary: Negative.   Musculoskeletal: Negative for falls, joint pain and myalgias.  Skin: Negative for rash.  Neurological: Positive for dizziness (With changing position of head, not with sitting to standing or with ambulation/exertion) and headaches (MIld, with coughing). Negative for sensory change and weakness.  Endo/Heme/Allergies: Negative for environmental allergies.  Psychiatric/Behavioral: Negative.   All other systems reviewed and are negative.    Physical Exam:  BP (!) 102/54   Pulse 61   Temp 97.9 F (36.6 C)   Ht 5' 2.5" (1.588 m)   Wt 162 lb (73.5 kg)   SpO2 96%   BMI 29.16 kg/m   General Appearance: Well nourished, in no apparent distress. Eyes: PERRLA, EOMs, conjunctiva no swelling or erythema Sinuses: No Frontal/maxillary tenderness ENT/Mouth: Ext aud canals clear, TMs without erythema, bulging, with mild fluid effusion bilatearlly. No erythema, swelling, or exudate on post pharynx.  Tonsils not swollen or erythematous. Hearing normal.  Neck:  Supple.  Respiratory: Respiratory effort normal, BS equal bilaterally without rales, rhonchi, wheezing or stridor.  Cardio: RRR with no MRGs. Brisk peripheral pulses without edema.  Abdomen: Soft, + BS.  Non tender. Lymphatics: Non tender without lymphadenopathy.  Musculoskeletal: Full ROM, symmetrical strength, slow steady gait.  Skin: Warm, dry without rashes, lesions, ecchymosis.  Neuro: Cranial nerves intact. Normal muscle tone, no cerebellar symptoms. Sensation intact.  Psych: Awake and oriented X 3, normal affect, Insight and Judgment appropriate.    Izora Ribas, NP 11:43 AM [facility]

## 2017-08-19 ENCOUNTER — Other Ambulatory Visit: Payer: Self-pay | Admitting: *Deleted

## 2017-08-19 MED ORDER — MECLIZINE HCL 25 MG PO TABS: ORAL_TABLET | ORAL | 0 refills | Status: DC

## 2017-08-20 ENCOUNTER — Other Ambulatory Visit: Payer: Self-pay | Admitting: Internal Medicine

## 2017-08-27 ENCOUNTER — Other Ambulatory Visit: Payer: Self-pay | Admitting: *Deleted

## 2017-08-27 MED ORDER — LEVOTHYROXINE SODIUM 150 MCG PO TABS: 150.0000 ug | ORAL_TABLET | Freq: Every day | ORAL | 1 refills | Status: DC

## 2017-09-26 ENCOUNTER — Other Ambulatory Visit: Payer: Self-pay | Admitting: Internal Medicine

## 2017-10-08 DIAGNOSIS — R42 Dizziness and giddiness: Secondary | ICD-10-CM | POA: Diagnosis not present

## 2017-10-08 DIAGNOSIS — H903 Sensorineural hearing loss, bilateral: Secondary | ICD-10-CM | POA: Diagnosis not present

## 2017-10-08 DIAGNOSIS — H6993 Unspecified Eustachian tube disorder, bilateral: Secondary | ICD-10-CM | POA: Diagnosis not present

## 2017-10-09 NOTE — Progress Notes (Signed)
[facility] [name], M.D.     Uvaldo Bristle. Silverio Lay, P.A.-C Liane Comber, [place] [address] [place], [place] [address] Telephone [phone] Telefax [phone] Annual Screening/Preventative Visit & Comprehensive Evaluation &  Examination     This very nice 84 y.o. [facility] presents for a Screening/Preventative Visit & comprehensive evaluation and management of multiple medical co-morbidities.  Patient has been followed for HTN, HLD, Prediabetes, Hypothyroidism  and Vitamin D Deficiency.      HTN predates circa 1986. Patient's BP has been very labile evidenced by frequent monitoring prompting frequent after hours calls.  Patient had an overnight observation in May 2018 with a (-) Head CT, MRI & carotid dopplers and she was discharged home on LD bASA for suspected possible TIA. She also has CKD3 attributed to her HTN.  Patient denies any cardiac symptoms as chest pain, palpitations, shortness of breath, dizziness or ankle swelling. Today's BP is at goal - 116/70.      Patient's hyperlipidemia is controlled with diet and medications. Patient denies myalgias or other medication SE's. Last lipids were at goal: Lab Results  Component Value Date   CHOL 160 07/09/2017   HDL 43 (L) 07/09/2017   LDLCALC 97 07/09/2017   TRIG 102 07/09/2017   CHOLHDL 3.7 07/09/2017      Patient has prediabetes (A1c 5.7%/2014, then 6.1% in 2016 & 2017, 6.0% in 2017 and 5.8& in 2018)  and patient denies reactive hypoglycemic symptoms, visual blurring, diabetic polys, or paresthesias. Last A1c was not at goal: Lab Results  Component Value Date   HGBA1C 6.0 (H) 07/09/2017          Patient was dx'd Hypothyroid in 1993 and has been on replacement since. Finally, patient has history of Vitamin D Deficiency ("36'/2009 and "29"/2016) and she does not supplement as repeated recommended  and last Vitamin D was still not at goal  (70-100): Lab Results  Component Value Date   VD25OH 39 07/09/2017   Current Outpatient Medications on File Prior to Visit  Medication Sig  . acetaminophen (TYLENOL) 325 MG tablet Take 325 mg by mouth every 6 (six) hours as needed.  Marland Kitchen amLODipine (NORVASC) 10 MG tablet TAKE 1/2 TO 1 (ONE-HALF TO ONE) TABLET BY MOUTH ONCE DAILY FOR BLOOD PRESSURE  . aspirin 81 MG tablet Take 81 mg by mouth daily.    Marland Kitchen atenolol (TENORMIN) 50 MG tablet Take 1 tablet daily for BP and palpitations (Patient taking differently: Take 1/2 tablet daily for BP and palpitations)  . benazepril (LOTENSIN) 40 MG tablet TAKE 1 TABLET BY MOUTH ONCE DAILY  . Cholecalciferol (VITAMIN D3) 1000 units CAPS Take 1,000 Units by mouth 2 (two) times daily.  . clorazepate (TRANXENE) 7.5 MG tablet Take 1/2 to 1 tablet 2 to 3 times a day if needed for anxiety.  . fluticasone (FLONASE) 50 MCG/ACT nasal spray Place 2 sprays into both nostrils at bedtime.  Marland Kitchen levothyroxine (SYNTHROID, LEVOTHROID) 150 MCG tablet Take 1 tablet (150 mcg total) by mouth daily.  . Magnesium 100 MG CAPS Take 100 mg by mouth every morning.   . meclizine (ANTIVERT) 25 MG tablet 1/2-1 pill up to 3 times daily for motion sickness/dizziness  . promethazine-dextromethorphan (PROMETHAZINE-DM) 6.25-15 MG/5ML syrup Take 5 mLs by mouth 4 (four) times daily as needed for cough.  . hydrochlorothiazide (HYDRODIURIL) 25 MG tablet Take 1 tablet daily for BP and fluid (Patient not taking: Reported on 08/08/2017)   No current facility-administered  medications on file prior to visit.    Allergies  Allergen Reactions  . Sulfa Antibiotics Other (See Comments)  . Asa [Aspirin] Other (See Comments)    Take 81mg  cannot take 325mg  gets nervous  . Codeine Nausea Only  . Prednisone Nausea And Vomiting   Past Medical History:  Diagnosis Date  . Anemia   . CKD (chronic kidney disease) stage 3, GFR 30-59 ml/min (HCC)    pt states she no longer has this  . Colon polyp   .  Diverticulosis   . Hyperlipidemia   . Hyperlipidemia   . Hypertension   . Hypothyroidism   . Iron deficiency   . Prediabetes   . Prediabetes   . Small bowel obstruction [facility])    Health Maintenance  Topic Date Due  . TETANUS/TDAP  02/25/1951  . INFLUENZA VACCINE  01/09/2018  . DEXA SCAN  Completed  . PNA vac Low Risk Adult  Completed   Immunization History  Administered Date(s) Administered  . DT 03/22/2015  . Influenza Split 03/04/2013  . Influenza, High Dose Seasonal PF 03/16/2014, 03/22/2015, 02/15/2016, 04/23/2017  . Pneumococcal Polysaccharide-23 03/16/2014  . Td 10/10/2017   Last Colon - &/31/2012 - Dr Carlean Purl - Tubovillous adenoma polyp &* was recommend 3-6 mo f/u repeat Colonoscopy which patient continues to refuse  Last MGM - 12/06/2008 - Patient refuses F/U [facility]  Past Surgical History:  Procedure Laterality Date  . CATARACT EXTRACTION    . EYE SURGERY    . MACULAR TEAR    . Vericose vein  1960   Family History  Problem Relation Age of Onset  . Heart disease Mother   . Stroke Mother   . Heart disease Father   . Colon polyps Sister   . Diabetes Sister   . Stroke Sister   . Heart disease Brother   . Stroke Brother    Social History   Tobacco Use  . Smoking status: Former Smoker    Packs/day: 1.00    Years: 15.00    Pack years: 15.00    Last attempt to quit: 06/11/1965    Years since quitting: 52.3  . Smokeless tobacco: Never Used  Substance Use Topics  . Alcohol use: No  . Drug use: No    ROS Constitutional: Denies fever, chills, weight loss/gain, headaches, insomnia,  night sweats, and change in appetite. Does c/o fatigue. Eyes: Denies redness, blurred vision, diplopia, discharge, itchy, watery eyes.  ENT: Denies discharge, congestion, post nasal drip, epistaxis, sore throat, earache, hearing loss, dental pain, Tinnitus, Vertigo, Sinus pain, snoring.  Cardio: Denies chest pain, palpitations, irregular heartbeat, syncope, dyspnea, diaphoresis,  orthopnea, PND, claudication, edema Respiratory: denies cough, dyspnea, DOE, pleurisy, hoarseness, laryngitis, wheezing.  Gastrointestinal: Denies dysphagia, heartburn, reflux, water brash, pain, cramps, nausea, vomiting, bloating, diarrhea, constipation, hematemesis, melena, hematochezia, jaundice, hemorrhoids Genitourinary: Denies dysuria, frequency, urgency, nocturia, hesitancy, discharge, hematuria, flank pain Breast: Breast lumps, nipple discharge, bleeding.  Musculoskeletal: Denies arthralgia, myalgia, stiffness, Jt. Swelling, pain, limp, and strain/sprain. Denies falls. Skin: Denies puritis, rash, hives, warts, acne, eczema, changing in skin lesion Neuro: No weakness, tremor, incoordination, spasms, paresthesia, pain Psychiatric: Denies confusion, memory loss, sensory loss. Denies Depression. Endocrine: Denies change in weight, skin, hair change, nocturia, and paresthesia, diabetic polys, visual blurring, hyper / hypo glycemic episodes.  Heme/Lymph: No excessive bleeding, bruising, enlarged lymph nodes.  Physical Exam  BP 116/70   Pulse 63   Temp 97.7 F (36.5 C)   Resp 14   Ht 5\' 3"  (1.6  m)   Wt 165 lb 3.2 oz (74.9 kg)   SpO2 99%   BMI 29.26 kg/m   General Appearance: Well nourished, well groomed and in no apparent distress.  Eyes: PERRLA, EOMs, conjunctiva no swelling or erythema, normal fundi and vessels. Sinuses: No frontal/maxillary tenderness ENT/Mouth: EACs patent / TMs  nl. Nares clear without erythema, swelling, mucoid exudates. Oral hygiene is good. No erythema, swelling, or exudate. Tongue normal, non-obstructing. Tonsils not swollen or erythematous. Hearing normal.  Neck: Supple, thyroid not palpable. No bruits, nodes or JVD. Respiratory: Respiratory effort normal.  BS equal and clear bilateral without rales, rhonci, wheezing or stridor. Cardio: Heart sounds are normal with regular rate and rhythm and no murmurs, rubs or gallops. Peripheral pulses are normal and  equal bilaterally without edema. No aortic or femoral bruits. Chest: symmetric with normal excursions and percussion. Breasts: Symmetric, without lumps, nipple discharge, retractions, or fibrocystic changes.  Abdomen: Flat, soft with bowel sounds active. Nontender, no guarding, rebound, hernias, masses, or organomegaly.  Lymphatics: Non tender without lymphadenopathy.  Genitourinary:  Musculoskeletal: Full ROM all peripheral extremities, joint stability, 5/5 strength, and normal gait. Skin: Warm and dry without rashes, lesions, cyanosis, clubbing or  ecchymosis.  Neuro: Cranial nerves intact, reflexes equal bilaterally. Normal muscle tone, no cerebellar symptoms. Sensation intact.  Pysch: Alert and oriented X 3, normal affect, Insight and Judgment appropriate.   Assessment and Plan  1. Annual Preventative Screening Examination  2. Essential hypertension  - EKG 12-Lead - Urinalysis, Routine w reflex microscopic - Microalbumin / creatinine urine ratio - CBC with Differential/Platelet - COMPLETE METABOLIC PANEL WITH GFR - Magnesium - TSH  3. Hyperlipidemia, mixed  - EKG 12-Lead - Lipid panel - TSH  4. Abnormal glucose  - EKG 12-Lead - Hemoglobin A1c - Insulin, random  5. Vitamin D deficiency  - VITAMIN D 25 Hydroxyl  6. Prediabetes  - EKG 12-Lead - Hemoglobin A1c - Insulin, random  7. Hypothyroidism  - TSH  8. CKD Stage III (GFR 56 ml/min)  - Urinalysis, Routine w reflex microscopic - Microalbumin / creatinine urine ratio  9. Screening for colorectal cancer  - POC Hemoccult Bld/Stl  10. Screening for ischemic heart disease  - EKG 12-Lead  11. Former smoker  - EKG 12-Lead  12. FHx: heart disease  - EKG 12-Lead  13. Medication management  - Urinalysis, Routine w reflex microscopic - Microalbumin / creatinine urine ratio - CBC with Differential/Platelet - COMPLETE METABOLIC PANEL WITH GFR - Magnesium - Lipid panel - TSH - Hemoglobin A1c -  Insulin, random - VITAMIN D 25 Hydroxyl  14. Need for prophylactic vaccination with tetanus-diphtheria (Td)  - Td vaccine            Patient was counseled in prudent diet to achieve/maintain BMI less than 25 for weight control, BP monitoring, regular exercise and medications. Discussed med's effects and SE's. Screening labs and tests as requested with regular follow-up as recommended. Over 40 minutes of exam, counseling, chart review and high complex critical decision making was performed.

## 2017-10-09 NOTE — Patient Instructions (Signed)

## 2017-10-10 ENCOUNTER — Encounter: Payer: Self-pay | Admitting: Internal Medicine

## 2017-10-10 ENCOUNTER — Ambulatory Visit (INDEPENDENT_AMBULATORY_CARE_PROVIDER_SITE_OTHER): Payer: Medicare HMO | Admitting: Internal Medicine

## 2017-10-10 VITALS — BP 116/70 | HR 63 | Temp 97.7°F | Resp 14 | Ht 63.0 in | Wt 165.2 lb

## 2017-10-10 DIAGNOSIS — Z87891 Personal history of nicotine dependence: Secondary | ICD-10-CM

## 2017-10-10 DIAGNOSIS — Z1211 Encounter for screening for malignant neoplasm of colon: Secondary | ICD-10-CM

## 2017-10-10 DIAGNOSIS — E559 Vitamin D deficiency, unspecified: Secondary | ICD-10-CM

## 2017-10-10 DIAGNOSIS — Z136 Encounter for screening for cardiovascular disorders: Secondary | ICD-10-CM

## 2017-10-10 DIAGNOSIS — Z23 Encounter for immunization: Secondary | ICD-10-CM | POA: Diagnosis not present

## 2017-10-10 DIAGNOSIS — I10 Essential (primary) hypertension: Secondary | ICD-10-CM

## 2017-10-10 DIAGNOSIS — N183 Chronic kidney disease, stage 3 unspecified: Secondary | ICD-10-CM

## 2017-10-10 DIAGNOSIS — E782 Mixed hyperlipidemia: Secondary | ICD-10-CM

## 2017-10-10 DIAGNOSIS — Z8249 Family history of ischemic heart disease and other diseases of the circulatory system: Secondary | ICD-10-CM

## 2017-10-10 DIAGNOSIS — Z0000 Encounter for general adult medical examination without abnormal findings: Secondary | ICD-10-CM | POA: Diagnosis not present

## 2017-10-10 DIAGNOSIS — R7309 Other abnormal glucose: Secondary | ICD-10-CM | POA: Diagnosis not present

## 2017-10-10 DIAGNOSIS — R7303 Prediabetes: Secondary | ICD-10-CM

## 2017-10-10 DIAGNOSIS — Z0001 Encounter for general adult medical examination with abnormal findings: Secondary | ICD-10-CM

## 2017-10-10 DIAGNOSIS — E039 Hypothyroidism, unspecified: Secondary | ICD-10-CM | POA: Diagnosis not present

## 2017-10-10 DIAGNOSIS — Z79899 Other long term (current) drug therapy: Secondary | ICD-10-CM | POA: Diagnosis not present

## 2017-10-10 DIAGNOSIS — Z1212 Encounter for screening for malignant neoplasm of rectum: Secondary | ICD-10-CM

## 2017-10-10 LAB — LIPID PANEL
Cholesterol: 158 mg/dL (ref <200)
HDL: 41 mg/dL — ABNORMAL LOW (ref >50)
LDL Cholesterol (Calc): 93 mg/dL (calc)
Non-HDL Cholesterol (Calc): 117 mg/dL (ref <130)
Total CHOL/HDL Ratio: 3.9 (calc) (ref <5.0)
Triglycerides: 139 mg/dL (ref <150)

## 2017-10-10 LAB — CBC WITH DIFFERENTIAL/PLATELET
Basophils Absolute: 47 cells/uL (ref 0–200)
Basophils Relative: 0.7 %
Eosinophils Absolute: 221 {cells}/uL (ref 15–500)
Eosinophils Relative: 3.3 %
HCT: 32.2 % — ABNORMAL LOW (ref 35.0–45.0)
Hemoglobin: 10.5 g/dL — ABNORMAL LOW (ref 11.7–15.5)
Lymphs Abs: 1621 cells/uL (ref 850–3900)
MCH: 27.9 pg (ref 27.0–33.0)
MCHC: 32.6 g/dL (ref 32.0–36.0)
MCV: 85.6 fL (ref 80.0–100.0)
MPV: 12.3 fL (ref 7.5–12.5)
Monocytes Relative: 8.8 %
Neutro Abs: 4221 cells/uL (ref 1500–7800)
Neutrophils Relative %: 63 %
Platelets: 200 10*3/uL (ref 140–400)
RBC: 3.76 10*6/uL — ABNORMAL LOW (ref 3.80–5.10)
RDW: 13.1 % (ref 11.0–15.0)
Total Lymphocyte: 24.2 %
WBC mixed population: 590 {cells}/uL (ref 200–950)
WBC: 6.7 10*3/uL (ref 3.8–10.8)

## 2017-10-10 LAB — COMPLETE METABOLIC PANEL WITH GFR
Albumin: 4.3 g/dL (ref 3.6–5.1)
Alkaline phosphatase (APISO): 73 U/L (ref 33–130)
BUN/Creatinine Ratio: 15 (calc) (ref 6–22)
CO2: 25 mmol/L (ref 20–32)
Calcium: 9.3 mg/dL (ref 8.6–10.4)
Creat: 1.15 mg/dL — ABNORMAL HIGH (ref 0.60–0.88)
GFR, Est African American: 51 mL/min/{1.73_m2} — ABNORMAL LOW (ref >=60)
GFR, Est Non African American: 44 mL/min/{1.73_m2} — ABNORMAL LOW (ref >=60)
Globulin: 2.9 g/dL (calc) (ref 1.9–3.7)
Sodium: 137 mmol/L (ref 135–146)

## 2017-10-10 LAB — COMPLETE METABOLIC PANEL WITHOUT GFR
AG Ratio: 1.5 (calc) (ref 1.0–2.5)
ALT: 12 U/L (ref 6–29)
AST: 22 U/L (ref 10–35)
BUN: 17 mg/dL (ref 7–25)
Chloride: 102 mmol/L (ref 98–110)
Glucose, Bld: 79 mg/dL (ref 65–99)
Potassium: 4.2 mmol/L (ref 3.5–5.3)
Total Bilirubin: 0.4 mg/dL (ref 0.2–1.2)
Total Protein: 7.2 g/dL (ref 6.1–8.1)

## 2017-10-10 LAB — TSH: TSH: 1.63 mIU/L (ref 0.40–4.50)

## 2017-10-10 LAB — MAGNESIUM: Magnesium: 1.9 mg/dL (ref 1.5–2.5)

## 2017-10-10 MED ORDER — CITALOPRAM HYDROBROMIDE 10 MG PO TABS: ORAL_TABLET | ORAL | 1 refills | Status: DC

## 2017-10-11 LAB — URINALYSIS, ROUTINE W REFLEX MICROSCOPIC
Bacteria, UA: NONE SEEN /HPF
Bilirubin Urine: NEGATIVE
Glucose, UA: NEGATIVE
Hgb urine dipstick: NEGATIVE
Hyaline Cast: NONE SEEN /LPF
Ketones, ur: NEGATIVE
Nitrite: NEGATIVE
Protein, ur: NEGATIVE
RBC / HPF: NONE SEEN /HPF (ref 0–2)
Specific Gravity, Urine: 1.007 (ref 1.001–1.03)
Squamous Epithelial / HPF: NONE SEEN /HPF
WBC, UA: NONE SEEN /HPF (ref 0–5)
pH: <=5 (ref 5.0–8.0)

## 2017-10-11 LAB — MICROALBUMIN / CREATININE URINE RATIO
Creatinine, Urine: 35 mg/dL (ref 20–275)
Microalb Creat Ratio: 14 ug/mg{creat} (ref <30)
Microalb, Ur: 0.5 mg/dL

## 2017-10-11 LAB — HEMOGLOBIN A1C
Hgb A1c MFr Bld: 5.9 %{Hb} — ABNORMAL HIGH (ref <5.7)
Mean Plasma Glucose: 123 (calc)
eAG (mmol/L): 6.8 (calc)

## 2017-10-11 LAB — INSULIN, RANDOM: Insulin: 7.6 u[IU]/mL (ref 2.0–19.6)

## 2017-10-11 LAB — VITAMIN D 25 HYDROXY (VIT D DEFICIENCY, FRACTURES): Vit D, 25-Hydroxy: 33 ng/mL (ref 30–100)

## 2017-10-12 ENCOUNTER — Encounter: Payer: Self-pay | Admitting: Internal Medicine

## 2017-10-21 ENCOUNTER — Other Ambulatory Visit: Payer: Self-pay | Admitting: *Deleted

## 2017-10-21 MED ORDER — BENAZEPRIL HCL 40 MG PO TABS: 40.0000 mg | ORAL_TABLET | Freq: Every day | ORAL | 1 refills | Status: DC

## 2017-10-24 ENCOUNTER — Other Ambulatory Visit: Payer: Self-pay | Admitting: Internal Medicine

## 2017-10-24 MED ORDER — DEXAMETHASONE 0.5 MG PO TABS: ORAL_TABLET | ORAL | 0 refills | Status: DC

## 2017-11-06 ENCOUNTER — Encounter: Payer: Self-pay | Admitting: Internal Medicine

## 2017-11-06 ENCOUNTER — Ambulatory Visit (INDEPENDENT_AMBULATORY_CARE_PROVIDER_SITE_OTHER): Payer: Medicare HMO | Admitting: Internal Medicine

## 2017-11-06 VITALS — BP 110/52 | HR 60 | Temp 97.5°F | Resp 16 | Ht 62.5 in | Wt 165.0 lb

## 2017-11-06 DIAGNOSIS — R5383 Other fatigue: Secondary | ICD-10-CM

## 2017-11-06 DIAGNOSIS — F419 Anxiety disorder, unspecified: Secondary | ICD-10-CM

## 2017-11-06 DIAGNOSIS — R0989 Other specified symptoms and signs involving the circulatory and respiratory systems: Secondary | ICD-10-CM | POA: Diagnosis not present

## 2017-11-06 DIAGNOSIS — Z136 Encounter for screening for cardiovascular disorders: Secondary | ICD-10-CM | POA: Diagnosis not present

## 2017-11-06 DIAGNOSIS — Z8249 Family history of ischemic heart disease and other diseases of the circulatory system: Secondary | ICD-10-CM | POA: Diagnosis not present

## 2017-11-06 DIAGNOSIS — R69 Illness, unspecified: Secondary | ICD-10-CM | POA: Diagnosis not present

## 2017-11-06 DIAGNOSIS — E559 Vitamin D deficiency, unspecified: Secondary | ICD-10-CM

## 2017-11-06 NOTE — Progress Notes (Signed)
Subjective:    Patient ID: Mikayla Harrison, female    DOB: 24-Jan-1932, 84 y.o.   MRN: [id number]  HPI   This very  nice 84 yo WWF very anxious re: her BP's and c/o general fatigue, denies c/o exertional CP or discomfort, dyspnea. Patient had recent labs done at recent regular f/u and she had a stable mild anemia and no other significant abnormalities. She is concerned that her BP of 110/52 is too low . She typically calls 3-4 x/ month after hours to discuss her BP reading which usually are not very elevated. At recent visit, she was Rx'd  Citalopram for her anxiety which she "threw away" after reading the side -effects on the pharmacy info sheet.   Medication Sig  . acetaminophen (TYLENOL) 325 MG tablet Take 325 mg by mouth every 6 (six) hours as needed.  Marland Kitchen amLODipine (NORVASC) 10 MG tablet TAKE 1/2 TO 1 (ONE-HALF TO ONE) TABLET BY MOUTH ONCE DAILY FOR BLOOD PRESSURE  . aspirin 81 MG tablet Take 81 mg by mouth daily.    . benazepril (LOTENSIN) 40 MG tablet Take 1 tablet (40 mg total) by mouth daily.  . Cholecalciferol (VITAMIN D3) 1000 units CAPS Take 1,000 Units by mouth 2 (two) times daily.  . clorazepate (TRANXENE) 7.5 MG tablet Take 1/2 to 1 tablet 2 to 3 times a day if needed for anxiety.  . fluticasone (FLONASE) 50 MCG/ACT nasal spray Place 2 sprays into both nostrils at bedtime.  Marland Kitchen levothyroxine (SYNTHROID, LEVOTHROID) 150 MCG tablet Take 1 tablet (150 mcg total) by mouth daily.  . meclizine (ANTIVERT) 25 MG tablet 1/2-1 pill up to 3 times daily for motion sickness/dizziness  . promethazine-dextromethorphan (PROMETHAZINE-DM) 6.25-15 MG/5ML syrup Take 5 mLs by mouth 4 (four) times daily as needed for cough.  Marland Kitchen atenolol (TENORMIN) 50 MG tablet Take 1 tablet daily for BP and palpitations (Patient taking differently: Take 1/2 tablet daily for BP and palpitations)  . hydrochlorothiazide (HYDRODIURIL) 25 MG tablet Take 1 tablet daily for BP and fluid (Patient not taking: Reported on 08/08/2017)  .  Magnesium 100 MG CAPS Take 100 mg by mouth every morning.   . citalopram (CELEXA) 10 MG tablet Take 1 tablet daily for Relaxation  . dexamethasone (DECADRON) 0.5 MG tablet Take 1 tab 3 x day - 3 days, then 2 x day - 3 days, then 1 tab daily   No facility-administered medications prior to visit.    Allergies  Allergen Reactions  . Sulfa Antibiotics Other (See Comments)  . Asa [Aspirin] Other (See Comments)    Take 81mg  cannot take 325mg  gets nervous  . Codeine Nausea Only  . Prednisone Nausea And Vomiting   Past Medical History:  Diagnosis Date  . Anemia   . CKD (chronic kidney disease) stage 3, GFR 30-59 ml/min (HCC)    pt states she no longer has this  . Colon polyp   . Diverticulosis   . Hyperlipidemia   . Hyperlipidemia   . Hypertension   . Hypothyroidism   . Iron deficiency   . Prediabetes   . Prediabetes   . Small bowel obstruction (HCC)    Review of Systems  10 point systems review negative except as above.    Objective:   Physical Exam  BP (!) 110/52   Pulse 60   Temp (!) 97.5 F (36.4 C)   Resp 16   Ht 5' 2.5" (1.588 m)   Wt 165 lb (74.8 kg)   BMI 29.70 kg/m  HEENT - WNL. Neck - supple.  Chest - Clear equal BS. Cor - Nl HS. RRR w/o sig MGR. PP 1(+). No edema. MS- FROM w/o deformities.  Gait Nl. Psyche - Very anxious. Neuro -  Nl w/o focal abnormalities.    Assessment & Plan:   1. Labile hypertension  - EKG 12-Lead  2. Fatigue  - EKG 12-Lead  3. Chronic anxiety  4. Acute anxiety  - EKG 12-Lead  5. Vitamin D deficiency  6. FHx: heart disease  - EKG 12-Lead  7. Screening for ischemic heart disease  - EKG 12-Lead - interpreted WNL & patient reassured.   - Patient encouraged to reconsider taking meds for acute & chronic anxiety

## 2017-11-06 NOTE — Patient Instructions (Signed)

## 2017-11-19 ENCOUNTER — Other Ambulatory Visit: Payer: Self-pay

## 2017-11-19 DIAGNOSIS — Z1212 Encounter for screening for malignant neoplasm of rectum: Principal | ICD-10-CM

## 2017-11-19 DIAGNOSIS — Z1211 Encounter for screening for malignant neoplasm of colon: Secondary | ICD-10-CM

## 2017-11-19 LAB — POC HEMOCCULT BLD/STL (HOME/3-CARD/SCREEN)
Card #2 Fecal Occult Blod, POC: NEGATIVE
Card #3 Fecal Occult Blood, POC: NEGATIVE
Fecal Occult Blood, POC: NEGATIVE

## 2017-12-09 DIAGNOSIS — T753XXD Motion sickness, subsequent encounter: Secondary | ICD-10-CM | POA: Diagnosis not present

## 2017-12-09 DIAGNOSIS — Z823 Family history of stroke: Secondary | ICD-10-CM | POA: Diagnosis not present

## 2017-12-09 DIAGNOSIS — Z833 Family history of diabetes mellitus: Secondary | ICD-10-CM | POA: Diagnosis not present

## 2017-12-09 DIAGNOSIS — R69 Illness, unspecified: Secondary | ICD-10-CM | POA: Diagnosis not present

## 2017-12-09 DIAGNOSIS — I10 Essential (primary) hypertension: Secondary | ICD-10-CM | POA: Diagnosis not present

## 2017-12-09 DIAGNOSIS — Z87891 Personal history of nicotine dependence: Secondary | ICD-10-CM | POA: Diagnosis not present

## 2017-12-09 DIAGNOSIS — R32 Unspecified urinary incontinence: Secondary | ICD-10-CM | POA: Diagnosis not present

## 2017-12-09 DIAGNOSIS — Z7982 Long term (current) use of aspirin: Secondary | ICD-10-CM | POA: Diagnosis not present

## 2017-12-09 DIAGNOSIS — Z8249 Family history of ischemic heart disease and other diseases of the circulatory system: Secondary | ICD-10-CM | POA: Diagnosis not present

## 2017-12-24 ENCOUNTER — Other Ambulatory Visit: Payer: Self-pay | Admitting: Physician Assistant

## 2018-01-01 ENCOUNTER — Other Ambulatory Visit: Payer: Self-pay

## 2018-01-01 MED ORDER — RANITIDINE HCL 300 MG PO TABS: ORAL_TABLET | ORAL | 1 refills | Status: DC

## 2018-01-07 ENCOUNTER — Ambulatory Visit: Payer: Self-pay | Admitting: Adult Health

## 2018-01-13 ENCOUNTER — Ambulatory Visit (INDEPENDENT_AMBULATORY_CARE_PROVIDER_SITE_OTHER): Payer: Medicare HMO | Admitting: Adult Health

## 2018-01-13 ENCOUNTER — Encounter: Payer: Self-pay | Admitting: Internal Medicine

## 2018-01-13 VITALS — BP 120/64 | HR 54 | Temp 97.3°F | Ht 62.5 in | Wt 166.0 lb

## 2018-01-13 DIAGNOSIS — H9201 Otalgia, right ear: Secondary | ICD-10-CM

## 2018-01-13 MED ORDER — AZITHROMYCIN 250 MG PO TABS: ORAL_TABLET | ORAL | 1 refills | Status: DC

## 2018-01-13 MED ORDER — MELOXICAM 15 MG PO TABS: ORAL_TABLET | ORAL | 0 refills | Status: DC

## 2018-01-13 MED ORDER — MECLIZINE HCL 25 MG PO TABS
ORAL_TABLET | ORAL | 0 refills | Status: DC
Start: 2018-01-13 — End: 2018-08-12

## 2018-01-13 NOTE — Progress Notes (Signed)
Assessment and Plan:  Mikayla Harrison was seen today for otalgia and dizziness.  Diagnoses and all orders for this visit:  Right ear pain, recurrent ? TMJ vs serous effusion, she declines prednisone, will try mobic x 1 week Emphasized allergy treatment - Allegra/zyrtec, flonase - if not better 2-4 weeks will refer to ENT -     meclizine (ANTIVERT) 25 MG tablet; 1/2-1 pill up to 3 times daily for motion sickness/dizziness -     azithromycin (ZITHROMAX) 250 MG tablet; Take 2 tablets (500 mg) on  Day 1,  followed by 1 tablet (250 mg) once daily on Days 2 through 5. -     meloxicam (MOBIC) 15 MG tablet; Take 1/2-1 daily with food once daily for 1 week, then as needed daily for pain (Dr. Melford Aase suggests trying this for your ear pain).  Further disposition pending results of labs. Discussed med's effects and SE's.   Over 15 minutes of exam, counseling, chart review, and critical decision making was performed.   Future Appointments  Date Time Provider [place]  01/20/2018 10:45 AM Liane Comber, NP [facility] None  04/30/2018  3:30 PM [name], MD [facility] None  11/04/2018  9:00 AM [name], MD [facility] None    ------------------------------------------------------------------------------------------------------------------   HPI BP 120/64   Pulse (!) 54   Temp (!) 97.3 F (36.3 C)   Ht 5' 2.5" (1.588 m)   Wt 166 lb (75.3 kg)   SpO2 99%   BMI 29.88 kg/m   84 y.o.female presents for evaluation of intermittent right ear pain x 3-4 days, with dizziness. She reports sensation of chills, no measured fever, she denies changes in her hearing or vision. She reports she had similar symptoms previously and cleared up with zpak. She has been taking meclizine for dizziness and reports this helps significantly. She denies pain being r/t chewing, cold exposure.   She reports she has a mildly productive cough ongoing for 2 months. Denies dyspnea, chest pain. She believes this is  r/t allergies but is resistant to taking a daily agent.   Past Medical History:  Diagnosis Date  . Anemia   . CKD (chronic kidney disease) stage 3, GFR 30-59 ml/min (HCC)    pt states she no longer has this  . Colon polyp   . Diverticulosis   . Hyperlipidemia   . Hyperlipidemia   . Hypertension   . Hypothyroidism   . Iron deficiency   . Prediabetes   . Prediabetes   . Small bowel obstruction (HCC)      Allergies  Allergen Reactions  . Sulfa Antibiotics Other (See Comments)  . Asa [Aspirin] Other (See Comments)    Take 81mg  cannot take 325mg  gets nervous  . Codeine Nausea Only  . Prednisone Nausea And Vomiting    Current Outpatient Medications on File Prior to Visit  Medication Sig  . acetaminophen (TYLENOL) 325 MG tablet Take 325 mg by mouth every 6 (six) hours as needed.  Marland Kitchen amLODipine (NORVASC) 10 MG tablet TAKE 1/2 TO 1 (ONE-HALF TO ONE) TABLET BY MOUTH ONCE DAILY FOR BLOOD PRESSURE  . aspirin 81 MG tablet Take 81 mg by mouth daily.    Marland Kitchen atenolol (TENORMIN) 50 MG tablet Take 1 tablet daily for BP and palpitations (Patient taking differently: Take 1/2 tablet daily for BP and palpitations)  . benazepril (LOTENSIN) 40 MG tablet Take 1 tablet (40 mg total) by mouth daily.  . Cholecalciferol (VITAMIN D3) 1000 units CAPS Take 1,000 Units by mouth 2 (two)  times daily.  . clorazepate (TRANXENE) 7.5 MG tablet TAKE 1/2 TO 1 (ONE-HALF TO ONE) TABLET BY MOUTH TWICE DAILY TO  THREE  TIMES  DAILY  IF  NEEDED  FOR  ANXIETY  . fluticasone (FLONASE) 50 MCG/ACT nasal spray Place 2 sprays into both nostrils at bedtime.  . hydrochlorothiazide (HYDRODIURIL) 25 MG tablet Take 1 tablet daily for BP and fluid  . levothyroxine (SYNTHROID, LEVOTHROID) 150 MCG tablet Take 1 tablet (150 mcg total) by mouth daily.  . Magnesium 100 MG CAPS Take 100 mg by mouth every morning.   . ranitidine (ZANTAC) 300 MG tablet Take 1 to 2 tablets daily for heartburn & reflux   No current facility-administered  medications on file prior to visit.     ROS: all negative except above.   Physical Exam:  BP 120/64   Pulse (!) 54   Temp (!) 97.3 F (36.3 C)   Ht 5' 2.5" (1.588 m)   Wt 166 lb (75.3 kg)   SpO2 99%   BMI 29.88 kg/m   General Appearance: Well nourished, in no apparent distress. Eyes: PERRLA, EOMs, conjunctiva no swelling or erythema Sinuses: No Frontal/maxillary tenderness ENT/Mouth: Ext aud canals clear, TMs without erythema, bulging. No erythema, swelling, or exudate on post pharynx.  Tonsils not swollen or erythematous. Hearing normal. No tragal, auricular, mastoid tenderness. She is ? Intermittently tender at TMJ Neck: Supple, No sternocleidomastoid tenderness,  Respiratory: Respiratory effort normal, BS equal bilaterally without rales, rhonchi, wheezing or stridor.  Cardio: RRR with no MRGs.  Lymphatics: Non tender without lymphadenopathy.  Musculoskeletal: Full ROM, normal gait.  Skin: Warm, dry without rashes, lesions, ecchymosis.  Neuro: Cranial nerves intact. Normal muscle tone, no cerebellar symptoms. Sensation intact.  Psych: Awake and oriented X 3, normal affect, Insight and Judgment appropriate.    Izora Ribas, NP 5:06 PM [facility]

## 2018-01-13 NOTE — Patient Instructions (Signed)
Consider starting on allegra or zyrtec until your cough stops  Start on flonase daily   Otitis Media With Effusion Otitis media with effusion (OME) occurs when there is inflammation of the middle ear and fluid in the middle ear space. There are no signs and symptoms of infection. The middle ear space contains air and the bones for hearing. Air in the middle ear space helps to transmit sound to the brain. OME is a common condition in children, and it often occurs after an ear infection. This condition may be present for several weeks or longer after an ear infection. Most cases of this condition get better on their own. What are the causes? OME is caused by a blockage of the eustachian tube in one or both ears. These tubes drain fluid in the ears to the back of the nose (nasopharynx). If the tissue in the tube swells up (edema), the tube closes. This prevents fluid from draining. Blockage can be caused by:  Ear infections.  Colds and other upper respiratory infections.  Allergies.  Irritants, such as tobacco smoke.  Enlarged adenoids. The adenoids are areas of soft tissue located high in the back of the throat, behind the nose and the roof of the mouth. They are part of the body's natural defense (immune) system.  A mass in the nasopharynx.  Damage to the ear caused by pressure changes (barotrauma).  What increases the risk? Your child is more likely to develop this condition if:  He or she has repeated ear and sinus infections.  He or she has allergies.  He or she is exposed to tobacco smoke.  He or she attends daycare.  He or she is not breastfed.  What are the signs or symptoms? Symptoms of this condition may not be obvious. Sometimes this condition does not have any symptoms, or symptoms may overlap with those of a cold or upper respiratory tract illness. Symptoms of this condition include:  Temporary hearing loss.  A feeling of fullness in the ear without  pain.  Irritability or agitation.  Balance (vestibular) problems.  As a result of hearing loss, your child may:  Listen to the TV at a loud volume.  Not respond to questions.  Ask "What?" often when spoken to.  Mistake or confuse one sound or word for another.  Perform poorly at school.  Have a poor attention span.  Become agitated or irritated easily.  How is this diagnosed? This condition is diagnosed with an ear exam. Your child's health care provider will look inside your child's ear with an instrument (otoscope) to check for redness, swelling, and fluid. Other tests may be done, including:  A test to check the movement of the eardrum (pneumatic otoscopy). This is done by squeezing a small amount of air into the ear.  A test that changes air pressure in the middle ear to check how well the eardrum moves and to see if the eustachian tube is working (tympanogram).  Hearing test (audiogram). This test involves playing tones at different pitches to see if your child can hear each tone.  How is this treated? Treatment for this condition depends on the cause. In many cases, the fluid goes away on its own. In some cases, your child may need a procedure to create a hole in the eardrum to allow fluid to drain (myringotomy) and to insert small drainage tubes (tympanostomy tubes) into the eardrums. These tubes help to drain fluid and prevent infection. This procedure may be recommended  if:  OME does not get better over several months.  Your child has many ear infections within several months.  Your child has noticeable hearing loss.  Your child has problems with speech and language development.  Surgery may also be done to remove the adenoids (adenoidectomy). Follow these instructions at home:  Give over-the-counter and prescription medicines only as told by your child's health care provider.  Keep children away from any tobacco smoke.  Keep all follow-up visits as told by  your child's health care provider. This is important. How is this prevented?  Keep your child's vaccinations up to date. Make sure your child gets all recommended vaccinations, including a pneumonia and flu vaccine.  Encourage hand washing. Your child should wash his or her hands often with soap and water. If there is no soap and water, he or she should use hand sanitizer.  Avoid exposing your child to tobacco smoke.  Breastfeed your baby, if possible. Babies who are breastfed as long as possible are less likely to develop this condition. Contact a health care provider if:  Your child's hearing does not get better after 3 months.  Your child's hearing is worse.  Your child has ear pain.  Your child has a fever.  Your child has drainage from the ear.  Your child is dizzy.  Your child has a lump on his or her neck. Get help right away if:  Your child has bleeding from the nose.  Your child cannot move part of her or his face.  Your child has trouble breathing.  Your child cannot smell.  Your child develops severe congestion.  Your child develops weakness.  Your child who is younger than 3 months has a temperature of 100F (38C) or higher. Summary  Otitis media with effusion (OME) occurs when there is inflammation of the middle ear and fluid in the middle ear space.  This condition is caused by blockage of one or both eustachian tubes, which drain fluid in the ears to the back of the nose.  Symptoms of this condition can include temporary hearing loss, a feeling of fullness in the ear, irritability or agitation, and balance (vestibular) problems. Sometimes, there are no symptoms.  This condition is diagnosed with an ear exam and tests, such as pneumatic otoscopy, tympanogram, and audiogram.  Treatment for this condition depends on the cause. In many cases, the fluid goes away on its own. This information is not intended to replace advice given to you by your health  care provider. Make sure you discuss any questions you have with your health care provider. Document Released: 08/18/2003 Document Revised: 04/19/2016 Document Reviewed: 04/19/2016 Elsevier Interactive Patient Education  2017 [employer].

## 2018-01-17 NOTE — Progress Notes (Signed)
MEDICARE ANNUAL WELLNESS VISIT AND FOLLOW UP  Assessment:     Encounter for Medicare annual wellness exam  Essential hypertension - continue medications, DASH diet, exercise and monitor at home. Call if greater than 130/80.  - CBC with Differential/Platelet   Prediabetes Discussed disease and risks Discussed diet/exercise, weight management  A1C  CKD Stage III (GFR 56 ml/min) Increase fluids, avoid NSAIDS, monitor sugars, will monitor - CMP/GFR  Hyperlipidemia At goal without medications at this time;  Continue low cholesterol diet and exercise.  Defer lipid panel as would not aggressively treat secondary to age  Medication management - Magnesium   Vitamin D deficiency Continue supplement  Hypothyroidism, unspecified hypothyroidism type -check TSH level, continue medications the same, reminded to take on an empty stomach 30-33mins before food.   Unifocal PVCs monitor, check labs   Diverticulosis of intestine without bleeding, unspecified intestinal tract location Controlled, diet   Raynaud phenomenon Declines meds at this time  Overweight Long discussion about weight loss, diet, and exercise Recommended diet heavy in fruits and veggies and low in animal meats, cheeses, and dairy products, appropriate calorie intake Discussed appropriate weight for height Follow up at next visit  History of TIA Control blood pressure, cholesterol, glucose, increase exercise.  Continue ASA  Myalgia Ongoing myalgias limiting significant ambulation Positive ANA titer in 2017 with 1:320+ ratio, anti DNA, sed, CK neg at that time but will repeat, discussed possible referral to rheumatology if similarly elevated - -     CK -     ANA -     DG Bone Density; Future -     Sedimentation rate -     Urinalysis, Routine w reflex microscopic  Estrogen deficiency -     DG Bone Density; Future   Over 30 minutes of exam, counseling, chart review, and critical decision making was  performed Future Appointments  Date Time Provider [place]  04/30/2018  3:30 PM [name], MD [facility] None  11/04/2018  9:00 AM [name], MD [facility] None     Plan:   During the course of the visit the patient was educated and counseled about appropriate screening and preventive services including:    Pneumococcal vaccine   Influenza vaccine  Td vaccine  Screening electrocardiogram  Screening mammography  Bone densitometry screening  Colorectal cancer screening  Diabetes screening  Glaucoma screening  Nutrition counseling   Advanced directives: given info/requested   Subjective:   Mikayla Harrison is a 84 y.o. female who presents for Medicare Annual Wellness Visit and 3 month follow up on hypertension, prediabetes, hyperlipidemia, vitamin D def. Patient was held overnight for observation in May 2018 with a (-) Head CT, MRI & carotid dopplers and she was discharged home on LD bASA for suspected possible TIA.  She is very concerned about ongoing myalgias, mostly in thighs, worse with ambulation and limiting activity; she has had autoimmune workup in 2017 including ANA (was + 1:320), neg CK, sed, anti-DNA at that time. She has not seen rheumatology. Will repeat ANA and CK today. She is not on statin, she is on ACEi  She lives by herself, has a lot of anxiety at night, has clorazepate if needed, takes 1/2-1/4 tab occasionally.   BMI is Body mass index is 30.06 kg/m., she has not been working on diet and exercise. Wt Readings from Last 3 Encounters:  01/20/18 167 lb (75.8 kg)  01/13/18 166 lb (75.3 kg)  11/06/17 165 lb (74.8 kg)   Her blood pressure has  been controlled at home, today their BP is BP: (!) 106/56.  She does workout. She denies chest pain, shortness of breath, dizziness.   She is not on cholesterol medication and denies myalgias. Her cholesterol is at goal. The cholesterol last visit was:   Lab Results  Component Value Date    CHOL 158 10/10/2017   HDL 41 (L) 10/10/2017   LDLCALC 93 10/10/2017   TRIG 139 10/10/2017   CHOLHDL 3.9 10/10/2017    She has CKD stage III due to HTN. Lab Results  Component Value Date   FIEPPIRJ 18 (L) 10/10/2017   She has been working on diet and exercise for prediabetes, and denies paresthesia of the feet, polydipsia, polyuria and visual disturbances. She is on bASA.  Last A1C in the office was:  Lab Results  Component Value Date   HGBA1C 5.9 (H) 10/10/2017   Patient is on Vitamin D supplement. Lab Results  Component Value Date   VD25OH 33 10/10/2017     She is on thyroid medication. Her medication was not changed last visit.   Lab Results  Component Value Date   TSH 1.63 10/10/2017     Medication Review Current Outpatient Medications on File Prior to Visit  Medication Sig  . acetaminophen (TYLENOL) 325 MG tablet Take 325 mg by mouth every 6 (six) hours as needed.  Marland Kitchen amLODipine (NORVASC) 10 MG tablet TAKE 1/2 TO 1 (ONE-HALF TO ONE) TABLET BY MOUTH ONCE DAILY FOR BLOOD PRESSURE  . aspirin 81 MG tablet Take 81 mg by mouth daily.    . benazepril (LOTENSIN) 40 MG tablet Take 1 tablet (40 mg total) by mouth daily.  . Cholecalciferol (VITAMIN D3) 1000 units CAPS Take 1,000 Units by mouth 2 (two) times daily.  . clorazepate (TRANXENE) 7.5 MG tablet TAKE 1/2 TO 1 (ONE-HALF TO ONE) TABLET BY MOUTH TWICE DAILY TO  THREE  TIMES  DAILY  IF  NEEDED  FOR  ANXIETY  . fluticasone (FLONASE) 50 MCG/ACT nasal spray Place 2 sprays into both nostrils at bedtime.  . hydrochlorothiazide (HYDRODIURIL) 25 MG tablet Take 1 tablet daily for BP and fluid  . levothyroxine (SYNTHROID, LEVOTHROID) 150 MCG tablet Take 1 tablet (150 mcg total) by mouth daily.  . Magnesium 100 MG CAPS Take 100 mg by mouth every morning.   . meclizine (ANTIVERT) 25 MG tablet 1/2-1 pill up to 3 times daily for motion sickness/dizziness  . meloxicam (MOBIC) 15 MG tablet Take 1/2-1 daily with food once daily for 1 week, then  as needed daily for pain (Dr. Melford Aase suggests trying this for your ear pain).  . ranitidine (ZANTAC) 300 MG tablet Take 1 to 2 tablets daily for heartburn & reflux  . atenolol (TENORMIN) 50 MG tablet Take 1 tablet daily for BP and palpitations (Patient taking differently: Take 1/2 tablet daily for BP and palpitations)   No current facility-administered medications on file prior to visit.     Current Problems (verified) Patient Active Problem List   Diagnosis Date Noted  . Abnormal glucose 10/10/2017  . FHx: heart disease 10/10/2017  . History of TIA (transient ischemic attack) 11/06/2016  . Raynaud phenomenon 08/01/2015  . Overweight (BMI 25.0-29.9) 03/22/2015  . 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 CKD Stage III (GFR 56 ml/min) 03/16/2014  . Hyperlipidemia 10/05/2013  . Medication management 10/05/2013  . Vitamin D deficiency 10/05/2013  . Prediabetes   . Diverticulosis   . Hypertension 01/14/2012  . Hypothyroidism 01/14/2012    Screening Tests Immunization History  Administered Date(s) Administered  . DT 03/22/2015  . Influenza Split 03/04/2013  . Influenza, High Dose Seasonal PF 03/16/2014, 03/22/2015, 02/15/2016, 04/23/2017  . Pneumococcal Polysaccharide-23 03/16/2014  . Td 10/10/2017    Preventative care: Last colonoscopy:  2004, will not get another EGD: 2012, Dr. Henrene Pastor Last mammogram: 2011, declines another and declines breast exam at this time.  Last pap smear/pelvic exam: remote DEXA:remote at Dr. Delanna Ahmadi office, willing to get  Prior vaccinations: TD or Tdap:2016 Influenza: 2018 Pneumococcal: 03/2014 Prevnar13: Due 03/2015 Shingles/Zostavax: declines  Names of Other Physician/Practitioners you currently use: 1. [facility]- here for primary care 2. Dr. Delman Cheadle, eye doctor, last visit 3 years 3. , dentist, last visit 3 years ago, has dentures  Patient Care Team: [name], MD as PCP - General (Internal  Medicine)  Allergies Allergies  Allergen Reactions  . Sulfa Antibiotics Other (See Comments)  . Asa [Aspirin] Other (See Comments)    Take 81mg  cannot take 325mg  gets nervous  . Codeine Nausea Only  . Prednisone Nausea And Vomiting    SURGICAL HISTORY She  has a past surgical history that includes Cataract extraction; Vericose vein (1960); MACULAR TEAR; and Eye surgery. FAMILY HISTORY Her family history includes Colon polyps in her sister; Diabetes in her sister; Heart disease in her brother, father, and mother; Stroke in her brother, mother, and sister. SOCIAL HISTORY She  reports that she quit smoking about 52 years ago. She has a 15.00 pack-year smoking history. She has never used smokeless tobacco. She reports that she does not drink alcohol or use drugs.  MEDICARE WELLNESS OBJECTIVES: Physical activity: yard work and walking Depression/mood screen:   Depression screen [facility] 2/9 01/20/2018  Decreased Interest 0  Down, Depressed, Hopeless 0  PHQ - 2 Score 0   ADLs:  In your present state of health, do you have any difficulty performing the following activities: 01/20/2018 10/10/2017  Hearing? N N  Vision? N N  Difficulty concentrating or making decisions? N N  Walking or climbing stairs? N N  Dressing or bathing? N N  Doing errands, shopping? N N  Some recent data might be hidden    Cognitive Testing  Alert? Yes  Normal Appearance?Yes  Oriented to person? Yes  Place? Yes   Time? Yes  Recall of three objects?  Yes  Can perform simple calculations? Yes  Displays appropriate judgment?Yes  Can read the correct time from a watch face?Yes  EOL planning: Does Patient Have a Medical Advance Directive?: No Would patient like information on creating a medical advance directive?: No - Patient declined No, declined information   Objective:   Blood pressure (!) 106/56, pulse (!) 55, temperature (!) 97.3 F (36.3 C), height 5' 2.5" (1.588 m), weight 167 lb (75.8 kg), SpO2 97  %. Body mass index is 30.06 kg/m.  General appearance: alert, no distress, WD/WN,  female HEENT: normocephalic, sclerae anicteric, TMs pearly, nares patent, no discharge or erythema, pharynx normal Oral cavity: MMM, no lesions Neck: supple, no lymphadenopathy, no thyromegaly, no masses Heart: RRR, normal S1, S2, no murmurs Lungs: CTA bilaterally, no wheezes, rhonchi, or rales Abdomen: +bs, soft, non tender, non distended, no masses, no hepatomegaly, no splenomegaly Musculoskeletal: nontender, no swelling, no obvious deformity Extremities: no edema, no cyanosis, no clubbing Pulses: 2+ symmetric, upper and lower extremities, normal cap refill Neurological: alert, oriented x 3, CN2-12 intact, strength normal upper extremities and lower extremities, sensation normal throughout, DTRs 2+ throughout, no cerebellar signs, gait normal  Psychiatric: normal affect, behavior normal, pleasant  Breast: defer Gyn: defer Rectal: defer   Medicare Attestation I have personally reviewed: The patient's medical and social history Their use of alcohol, tobacco or illicit drugs Their current medications and supplements The patient's functional ability including ADLs,fall risks, home safety risks, cognitive, and hearing and visual impairment Diet and physical activities Evidence for depression or mood disorders  The patient's weight, height, BMI, and visual acuity have been recorded in the chart.  I have made referrals, counseling, and provided education to the patient based on review of the above and I have provided the patient with a written personalized care plan for preventive services.     Izora Ribas, NP   01/20/2018

## 2018-01-20 ENCOUNTER — Encounter: Payer: Self-pay | Admitting: Adult Health

## 2018-01-20 ENCOUNTER — Ambulatory Visit (INDEPENDENT_AMBULATORY_CARE_PROVIDER_SITE_OTHER): Payer: Medicare HMO | Admitting: Adult Health

## 2018-01-20 VITALS — BP 106/56 | HR 55 | Temp 97.3°F | Ht 62.5 in | Wt 167.0 lb

## 2018-01-20 DIAGNOSIS — N183 Chronic kidney disease, stage 3 unspecified: Secondary | ICD-10-CM

## 2018-01-20 DIAGNOSIS — Z79899 Other long term (current) drug therapy: Secondary | ICD-10-CM

## 2018-01-20 DIAGNOSIS — E039 Hypothyroidism, unspecified: Secondary | ICD-10-CM | POA: Diagnosis not present

## 2018-01-20 DIAGNOSIS — R7303 Prediabetes: Secondary | ICD-10-CM | POA: Diagnosis not present

## 2018-01-20 DIAGNOSIS — E559 Vitamin D deficiency, unspecified: Secondary | ICD-10-CM

## 2018-01-20 DIAGNOSIS — R7309 Other abnormal glucose: Secondary | ICD-10-CM | POA: Diagnosis not present

## 2018-01-20 DIAGNOSIS — E2839 Other primary ovarian failure: Secondary | ICD-10-CM

## 2018-01-20 DIAGNOSIS — I10 Essential (primary) hypertension: Secondary | ICD-10-CM | POA: Diagnosis not present

## 2018-01-20 DIAGNOSIS — Z8673 Personal history of transient ischemic attack (TIA), and cerebral infarction without residual deficits: Secondary | ICD-10-CM

## 2018-01-20 DIAGNOSIS — I7300 Raynaud's syndrome without gangrene: Secondary | ICD-10-CM

## 2018-01-20 DIAGNOSIS — K5790 Diverticulosis of intestine, part unspecified, without perforation or abscess without bleeding: Secondary | ICD-10-CM | POA: Diagnosis not present

## 2018-01-20 DIAGNOSIS — I493 Ventricular premature depolarization: Secondary | ICD-10-CM | POA: Diagnosis not present

## 2018-01-20 DIAGNOSIS — M791 Myalgia, unspecified site: Secondary | ICD-10-CM

## 2018-01-20 DIAGNOSIS — Z0000 Encounter for general adult medical examination without abnormal findings: Secondary | ICD-10-CM

## 2018-01-20 DIAGNOSIS — E663 Overweight: Secondary | ICD-10-CM

## 2018-01-20 DIAGNOSIS — E782 Mixed hyperlipidemia: Secondary | ICD-10-CM

## 2018-01-20 NOTE — Patient Instructions (Addendum)
Call insurance and ask about zostavax and shingrix - if they would cover either of them (shingrix is newer/better)    Aim for 7+ servings of fruits and vegetables daily  65-80+ fluid ounces of water or unsweet tea for healthy kidneys  Limit animal fats in diet for cholesterol and heart health - choose grass fed whenever available  Avoid highly processed foods, and foods high in saturated/trans fats  Aim for low stress - take time to unwind and care for your mental health  Aim for 150 min of moderate intensity exercise weekly for heart health, and weights twice weekly for bone health  Aim for 7-9 hours of sleep daily   Muscle Pain, Adult Muscle pain (myalgia) may be mild or severe. In most cases, the pain lasts only a short time and it goes away without treatment. It is normal to feel some muscle pain after starting a workout program. Muscles that have not been used often will be sore at first. Muscle pain may also be caused by many other things, including:  Overuse or muscle strain, especially if you are not in shape. This is the most common cause of muscle pain.  Injury.  Bruises.  Viruses, such as the flu.  Infectious diseases.  A chronic condition that causes muscle tenderness, fatigue, and headache (fibromyalgia).  A condition, such as lupus, in which the body's disease-fighting system attacks other organs in the body (autoimmune or rheumatologic diseases).  Certain drugs, including ACE inhibitors and statins.  To diagnose the cause of your muscle pain, your health care provider will do a physical exam and ask questions about the pain and when it began. If you have not had muscle pain for very long, your health care provider may want to wait before doing much testing. If your muscle pain has lasted a long time, your health care provider may want to run tests right away. In some cases, this may include tests to rule out certain conditions or illnesses. Treatment for muscle  pain depends on the cause. Home care is often enough to relieve muscle pain. Your health care provider may also prescribe anti-inflammatory medicine. Follow these instructions at home: Activity  If overuse is causing your muscle pain: ? Slow down your activities until the pain goes away. ? Do regular, gentle exercises if you are not usually active. ? Warm up before exercising. Stretch before and after exercising. This can help lower the risk of muscle pain.  Do not continue working out if the pain is very bad. Bad pain could mean that you have injured a muscle. Managing pain and discomfort   If directed, apply ice to the sore muscle: ? Put ice in a plastic bag. ? Place a towel between your skin and the bag. ? Leave the ice on for 20 minutes, 2-3 times a day.  You may also alternate between applying ice and applying heat as told by your health care provider. To apply heat, use the heat source that your health care provider recommends, such as a moist heat pack or a heating pad. ? Place a towel between your skin and the heat source. ? Leave the heat on for 20-30 minutes. ? Remove the heat if your skin turns bright red. This is especially important if you are unable to feel pain, heat, or cold. You may have a greater risk of getting burned. Medicines  Take over-the-counter and prescription medicines only as told by your health care provider.  Do not drive or use  heavy machinery while taking prescription pain medicine. Contact a health care provider if:  Your muscle pain gets worse and medicines do not help.  You have muscle pain that lasts longer than 3 days.  You have a rash or fever along with muscle pain.  You have muscle pain after a tick bite.  You have muscle pain while working out, even though you are in good physical condition.  You have redness, soreness, or swelling along with muscle pain.  You have muscle pain after starting a new medicine or changing the dose of a  medicine. Get help right away if:  You have trouble breathing.  You have trouble swallowing.  You have muscle pain along with a stiff neck, fever, and vomiting.  You have severe muscle weakness or cannot move part of your body. This information is not intended to replace advice given to you by your health care provider. Make sure you discuss any questions you have with your health care provider. Document Released: 04/19/2006 Document Revised: 12/16/2015 Document Reviewed: 10/18/2015 Elsevier Interactive Patient Education  2018 [employer].

## 2018-01-21 LAB — URINALYSIS, ROUTINE W REFLEX MICROSCOPIC
Bacteria, UA: NONE SEEN /HPF
Bilirubin Urine: NEGATIVE
Glucose, UA: NEGATIVE
Hgb urine dipstick: NEGATIVE
Hyaline Cast: NONE SEEN /LPF
Ketones, ur: NEGATIVE
Nitrite: NEGATIVE
Protein, ur: NEGATIVE
RBC / HPF: NONE SEEN /HPF (ref 0–2)
Specific Gravity, Urine: 1.01 (ref 1.001–1.03)
Squamous Epithelial / HPF: NONE SEEN /HPF (ref <=5)
WBC, UA: NONE SEEN /HPF (ref 0–5)
pH: 6 (ref 5.0–8.0)

## 2018-01-23 ENCOUNTER — Other Ambulatory Visit: Payer: Self-pay | Admitting: Adult Health

## 2018-01-23 ENCOUNTER — Other Ambulatory Visit: Payer: Self-pay | Admitting: Internal Medicine

## 2018-01-23 DIAGNOSIS — I10 Essential (primary) hypertension: Secondary | ICD-10-CM

## 2018-01-23 DIAGNOSIS — R768 Other specified abnormal immunological findings in serum: Secondary | ICD-10-CM

## 2018-01-23 DIAGNOSIS — M791 Myalgia, unspecified site: Secondary | ICD-10-CM

## 2018-01-23 DIAGNOSIS — R002 Palpitations: Secondary | ICD-10-CM

## 2018-01-23 LAB — TEST AUTHORIZATION

## 2018-01-23 LAB — HEMOGLOBIN A1C
Hgb A1c MFr Bld: 6.1 % of total Hgb — ABNORMAL HIGH (ref <5.7)
Mean Plasma Glucose: 128 (calc)
eAG (mmol/L): 7.1 (calc)

## 2018-01-23 LAB — CBC WITH DIFFERENTIAL/PLATELET
Basophils Absolute: 70 {cells}/uL (ref 0–200)
Basophils Relative: 1 %
Eosinophils Absolute: 217 {cells}/uL (ref 15–500)
Eosinophils Relative: 3.1 %
HCT: 29.8 % — ABNORMAL LOW (ref 35.0–45.0)
Hemoglobin: 10 g/dL — ABNORMAL LOW (ref 11.7–15.5)
Lymphs Abs: 1463 cells/uL (ref 850–3900)
MCH: 28.2 pg (ref 27.0–33.0)
MCHC: 33.6 g/dL (ref 32.0–36.0)
MCV: 83.9 fL (ref 80.0–100.0)
MPV: 11.4 fL (ref 7.5–12.5)
Monocytes Relative: 9.6 %
Neutro Abs: 4578 cells/uL (ref 1500–7800)
Neutrophils Relative %: 65.4 %
Platelets: 254 10*3/uL (ref 140–400)
RBC: 3.55 10*6/uL — ABNORMAL LOW (ref 3.80–5.10)
RDW: 12.9 % (ref 11.0–15.0)
Total Lymphocyte: 20.9 %
WBC mixed population: 672 cells/uL (ref 200–950)
WBC: 7 10*3/uL (ref 3.8–10.8)

## 2018-01-23 LAB — COMPLETE METABOLIC PANEL WITH GFR
AG Ratio: 1.3 (calc) (ref 1.0–2.5)
Albumin: 3.9 g/dL (ref 3.6–5.1)
BUN/Creatinine Ratio: 23 (calc) — ABNORMAL HIGH (ref 6–22)
CO2: 26 mmol/L (ref 20–32)
Calcium: 9.1 mg/dL (ref 8.6–10.4)
Creat: 1.23 mg/dL — ABNORMAL HIGH (ref 0.60–0.88)
GFR, Est African American: 47 mL/min/{1.73_m2} — ABNORMAL LOW (ref >=60)
GFR, Est Non African American: 41 mL/min/{1.73_m2} — ABNORMAL LOW (ref >=60)
Globulin: 2.9 g/dL (calc) (ref 1.9–3.7)
Potassium: 5 mmol/L (ref 3.5–5.3)
Sodium: 131 mmol/L — ABNORMAL LOW (ref 135–146)
Total Protein: 6.8 g/dL (ref 6.1–8.1)

## 2018-01-23 LAB — VITAMIN D 25 HYDROXY (VIT D DEFICIENCY, FRACTURES): Vit D, 25-Hydroxy: 44 ng/mL (ref 30–100)

## 2018-01-23 LAB — CK: Total CK: 117 U/L (ref 29–143)

## 2018-01-23 LAB — COMPLETE METABOLIC PANEL WITHOUT GFR
ALT: 11 U/L (ref 6–29)
AST: 21 U/L (ref 10–35)
Alkaline phosphatase (APISO): 68 U/L (ref 33–130)
BUN: 28 mg/dL — ABNORMAL HIGH (ref 7–25)
Chloride: 96 mmol/L — ABNORMAL LOW (ref 98–110)
Glucose, Bld: 94 mg/dL (ref 65–99)
Total Bilirubin: 0.3 mg/dL (ref 0.2–1.2)

## 2018-01-23 LAB — TSH: TSH: 2.62 m[IU]/L (ref 0.40–4.50)

## 2018-01-23 LAB — ANTI-NUCLEAR AB-TITER (ANA TITER): ANA Titer 1: >=1:1280 {titer} — AB

## 2018-01-23 LAB — ANA: Anti Nuclear Antibody(ANA): POSITIVE — AB

## 2018-01-27 ENCOUNTER — Other Ambulatory Visit: Payer: Medicare HMO

## 2018-01-28 ENCOUNTER — Ambulatory Visit: Payer: Self-pay | Admitting: Adult Health

## 2018-01-28 ENCOUNTER — Other Ambulatory Visit: Payer: Medicare HMO

## 2018-01-28 DIAGNOSIS — M791 Myalgia, unspecified site: Secondary | ICD-10-CM

## 2018-01-28 DIAGNOSIS — R768 Other specified abnormal immunological findings in serum: Secondary | ICD-10-CM

## 2018-01-29 LAB — RHEUMATOID FACTOR: Rheumatoid fact SerPl-aCnc: <14 [IU]/mL (ref <14)

## 2018-01-29 LAB — RNP ANTIBODY: Ribonucleic Protein(ENA) Antibody, IgG: <1 AI

## 2018-01-29 LAB — ANTI-DNA ANTIBODY, DOUBLE-STRANDED: ds DNA Ab: <1 [IU]/mL

## 2018-01-29 LAB — CENTROMERE ANTIBODIES: Centromere Ab Screen: >8 AI — AB

## 2018-01-29 LAB — CYCLIC CITRUL PEPTIDE ANTIBODY, IGG: Cyclic Citrullin Peptide Ab: <16 U

## 2018-01-29 LAB — ANTI-SMITH ANTIBODY: ENA SM Ab Ser-aCnc: <1 AI

## 2018-01-29 LAB — ANTI-SCLERODERMA ANTIBODY: Scleroderma (Scl-70) (ENA) Antibody, IgG: <1 AI

## 2018-01-29 LAB — C-REACTIVE PROTEIN: CRP: 4.3 mg/L (ref <8.0)

## 2018-02-12 ENCOUNTER — Encounter: Payer: Self-pay | Admitting: Physician Assistant

## 2018-02-12 ENCOUNTER — Ambulatory Visit (INDEPENDENT_AMBULATORY_CARE_PROVIDER_SITE_OTHER): Payer: Medicare HMO | Admitting: Physician Assistant

## 2018-02-12 VITALS — BP 150/72 | HR 83 | Temp 98.3°F | Resp 16 | Ht 62.5 in | Wt 167.8 lb

## 2018-02-12 DIAGNOSIS — M341 CR(E)ST syndrome: Secondary | ICD-10-CM | POA: Insufficient documentation

## 2018-02-12 DIAGNOSIS — I10 Essential (primary) hypertension: Secondary | ICD-10-CM | POA: Diagnosis not present

## 2018-02-12 DIAGNOSIS — M359 Systemic involvement of connective tissue, unspecified: Secondary | ICD-10-CM | POA: Diagnosis not present

## 2018-02-12 DIAGNOSIS — M791 Myalgia, unspecified site: Secondary | ICD-10-CM | POA: Diagnosis not present

## 2018-02-12 DIAGNOSIS — E785 Hyperlipidemia, unspecified: Secondary | ICD-10-CM | POA: Diagnosis not present

## 2018-02-12 DIAGNOSIS — D8989 Other specified disorders involving the immune mechanism, not elsewhere classified: Secondary | ICD-10-CM | POA: Diagnosis not present

## 2018-02-12 DIAGNOSIS — I7300 Raynaud's syndrome without gangrene: Secondary | ICD-10-CM | POA: Diagnosis not present

## 2018-02-12 DIAGNOSIS — N189 Chronic kidney disease, unspecified: Secondary | ICD-10-CM | POA: Diagnosis not present

## 2018-02-12 DIAGNOSIS — R768 Other specified abnormal immunological findings in serum: Secondary | ICD-10-CM | POA: Diagnosis not present

## 2018-02-12 NOTE — Patient Instructions (Addendum)
Continue your morning medications Take the amlodipine 1/2 pill at 7 o clock Monitor your BP around 9  Your goal is to keep a blood pressure of less than 140/80 If it goes above this take 1/2 of the atenolol  The atenolol decreases your heart rate, monitor your heart rate being off of it.  I'm hoping this helps with energy.   Monitor your blood pressure at home, please keep a record and bring that in with you to your next office visit.   Go to the ER if any CP, SOB, nausea, dizziness, severe HA, changes vision/speech   If you are willing, our goal BP is the top number of 120.  Your most recent BP: BP: (!) 150/72   Take your medications faithfully as instructed. Maintain a healthy weight. Get at least 150 minutes of aerobic exercise per week. Minimize salt intake. Minimize alcohol intake  DASH Eating Plan DASH stands for "Dietary Approaches to Stop Hypertension." The DASH eating plan is a healthy eating plan that has been shown to reduce high blood pressure (hypertension). Additional health benefits may include reducing the risk of type 2 diabetes mellitus, heart disease, and stroke. The DASH eating plan may also help with weight loss. WHAT DO I NEED TO KNOW ABOUT THE DASH EATING PLAN? For the DASH eating plan, you will follow these general guidelines:  Choose foods with a percent daily value for sodium of less than 5% (as listed on the food label).  Use salt-free seasonings or herbs instead of table salt or sea salt.  Check with your health care provider or pharmacist before using salt substitutes.  Eat lower-sodium products, often labeled as "lower sodium" or "no salt added."  Eat fresh foods.  Eat more vegetables, fruits, and low-fat dairy products.  Choose whole grains. Look for the word "whole" as the first word in the ingredient list.  Choose fish and skinless chicken or [place] more often than red meat. Limit fish, poultry, and meat to 6 oz (170 g) each day.  Limit  sweets, desserts, sugars, and sugary drinks.  Choose heart-healthy fats.  Limit cheese to 1 oz (28 g) per day.  Eat more home-cooked food and less restaurant, buffet, and fast food.  Limit fried foods.  Cook foods using methods other than frying.  Limit canned vegetables. If you do use them, rinse them well to decrease the sodium.  When eating at a restaurant, ask that your food be prepared with less salt, or no salt if possible. WHAT FOODS CAN I EAT? Seek help from a dietitian for individual calorie needs. Grains Whole grain or whole wheat bread. Brown rice. Whole grain or whole wheat pasta. Quinoa, bulgur, and whole grain cereals. Low-sodium cereals. Corn or whole wheat flour tortillas. Whole grain cornbread. Whole grain crackers. Low-sodium crackers. Vegetables Fresh or frozen vegetables (raw, steamed, roasted, or grilled). Low-sodium or reduced-sodium tomato and vegetable juices. Low-sodium or reduced-sodium tomato sauce and paste. Low-sodium or reduced-sodium canned vegetables.  Fruits All fresh, canned (in natural juice), or frozen fruits. Meat and Other Protein Products Ground beef (85% or leaner), grass-fed beef, or beef trimmed of fat. Skinless chicken or [place]. Ground chicken or [place]. Pork trimmed of fat. All fish and seafood. Eggs. Dried beans, peas, or lentils. Unsalted nuts and seeds. Unsalted canned beans. Dairy Low-fat dairy products, such as skim or 1% milk, 2% or reduced-fat cheeses, low-fat ricotta or cottage cheese, or plain low-fat yogurt. Low-sodium or reduced-sodium cheeses. Fats and Oils Tub margarines without  trans fats. Light or reduced-fat mayonnaise and salad dressings (reduced sodium). Avocado. Safflower, olive, or canola oils. Natural peanut or almond butter. Other Unsalted popcorn and pretzels. The items listed above may not be a complete list of recommended foods or beverages. Contact your dietitian for more options. WHAT FOODS ARE NOT  RECOMMENDED? Grains White bread. White pasta. White rice. Refined cornbread. Bagels and croissants. Crackers that contain trans fat. Vegetables Creamed or fried vegetables. Vegetables in a cheese sauce. Regular canned vegetables. Regular canned tomato sauce and paste. Regular tomato and vegetable juices. Fruits Dried fruits. Canned fruit in light or heavy syrup. Fruit juice. Meat and Other Protein Products Fatty cuts of meat. Ribs, chicken wings, bacon, sausage, bologna, salami, chitterlings, fatback, hot dogs, bratwurst, and packaged luncheon meats. Salted nuts and seeds. Canned beans with salt. Dairy Whole or 2% milk, cream, half-and-half, and cream cheese. Whole-fat or sweetened yogurt. Full-fat cheeses or blue cheese. Nondairy creamers and whipped toppings. Processed cheese, cheese spreads, or cheese curds. Condiments Onion and garlic salt, seasoned salt, table salt, and sea salt. Canned and packaged gravies. Worcestershire sauce. Tartar sauce. Barbecue sauce. Teriyaki sauce. Soy sauce, including reduced sodium. Steak sauce. Fish sauce. Oyster sauce. Cocktail sauce. Horseradish. Ketchup and mustard. Meat flavorings and tenderizers. Bouillon cubes. Hot sauce. Tabasco sauce. Marinades. Taco seasonings. Relishes. Fats and Oils Butter, stick margarine, lard, shortening, ghee, and bacon fat. Coconut, palm kernel, or palm oils. Regular salad dressings. Other Pickles and olives. Salted popcorn and pretzels. The items listed above may not be a complete list of foods and beverages to avoid. Contact your dietitian for more information. WHERE CAN I FIND MORE INFORMATION? National Heart, [employer], and [employer] Institute: travelstabloid.com Document Released: 05/17/2011 Document Revised: 10/12/2013 Document Reviewed: 04/01/2013 [facility] Patient Information 2015 [place], [place]. This information is not intended to replace advice given to you by your health care provider. Make  sure you discuss any questions you have with your health care provider.

## 2018-02-12 NOTE — Progress Notes (Signed)
Assessment and Plan:   Hypertension, unspecified type - continue medications, DASH diet, exercise and monitor at home. Call if greater than 130/80.  Try to be off atenolol due to bradycardia and try benazepril in the AM and 1/2 of norvasc at 7 or 5 mg at 7  CREST syndrome ([place]) Continue follow up rheum   HPI 84 y.o.female presents for BP check.  She has CKD, HTN, hypothyroidism presents with abnormal BP. She is on norvasc 10mg  1/2-1 daily, atenolol 50 mg 1/2-1 daily, benazepril 40mg  and HCTZ 25mg  daily.  She was recently seen on 01/20/2018 for a medicare wellness visit.  She is on mobic.  BP Readings from Last 3 Encounters:  02/12/18 (!) 150/72  01/20/18 (!) 106/56  01/13/18 120/64   Lab Results  Component Value Date   TSH 2.62 01/20/2018      Current Outpatient Medications (Endocrine & Metabolic):  .  levothyroxine (SYNTHROID, LEVOTHROID) 150 MCG tablet, Take 1 tablet (150 mcg total) by mouth daily.  Current Outpatient Medications (Cardiovascular):  .  amLODipine (NORVASC) 10 MG tablet, TAKE 1/2 TO 1 (ONE-HALF TO ONE) TABLET BY MOUTH ONCE DAILY FOR BLOOD PRESSURE .  atenolol (TENORMIN) 50 MG tablet, Take 1/2 to 1 tablet daily as directed for BP & Palpitations .  benazepril (LOTENSIN) 40 MG tablet, Take 1 tablet (40 mg total) by mouth daily. .  hydrochlorothiazide (HYDRODIURIL) 25 MG tablet, Take 1 tablet daily for BP and fluid  Current Outpatient Medications (Respiratory):  .  fluticasone (FLONASE) 50 MCG/ACT nasal spray, Place 2 sprays into both nostrils at bedtime.  Current Outpatient Medications (Analgesics):  .  acetaminophen (TYLENOL) 325 MG tablet, Take 325 mg by mouth every 6 (six) hours as needed. Marland Kitchen  aspirin 81 MG tablet, Take 81 mg by mouth daily.   .  meloxicam (MOBIC) 15 MG tablet, Take 1/2-1 daily with food once daily for 1 week, then as needed daily for pain (Dr. Melford Aase suggests trying this for your ear pain).   Current Outpatient Medications  (Other):  Marland Kitchen  Cholecalciferol (VITAMIN D3) 1000 units CAPS, Take 1,000 Units by mouth 2 (two) times daily. .  clorazepate (TRANXENE) 7.5 MG tablet, TAKE 1/2 TO 1 (ONE-HALF TO ONE) TABLET BY MOUTH TWICE DAILY TO  THREE  TIMES  DAILY  IF  NEEDED  FOR  ANXIETY .  Magnesium 100 MG CAPS, Take 100 mg by mouth every morning.  .  meclizine (ANTIVERT) 25 MG tablet, 1/2-1 pill up to 3 times daily for motion sickness/dizziness .  ranitidine (ZANTAC) 300 MG tablet, Take 1 to 2 tablets daily for heartburn & reflux   Past Medical History:  Diagnosis Date  . Anemia   . CKD (chronic kidney disease) stage 3, GFR 30-59 ml/min (HCC)    pt states she no longer has this  . Colon polyp   . Diverticulosis   . Hyperlipidemia   . Hyperlipidemia   . Hypertension   . Hypothyroidism   . Iron deficiency   . Prediabetes   . Prediabetes   . Small bowel obstruction (HCC)      Allergies  Allergen Reactions  . Sulfa Antibiotics Other (See Comments)  . Asa [Aspirin] Other (See Comments)    Take 81mg  cannot take 325mg  gets nervous  . Codeine Nausea Only  . Prednisone Nausea And Vomiting    ROS: all negative except above.   Physical Exam: Filed Weights   02/12/18 1507  Weight: 167 lb 12.8 oz (76.1 kg)   BP Marland Kitchen)  150/72   Pulse 83   Temp 98.3 F (36.8 C)   Resp 16   Ht 5' 2.5" (1.588 m)   Wt 167 lb 12.8 oz (76.1 kg)   SpO2 99%   BMI 30.20 kg/m  General Appearance: Well nourished, in no apparent distress. Eyes: PERRLA, EOMs, conjunctiva no swelling or erythema Sinuses: No Frontal/maxillary tenderness ENT/Mouth: Ext aud canals clear, TMs without erythema, bulging. No erythema, swelling, or exudate on post pharynx.  Tonsils not swollen or erythematous. Hearing normal.  Neck: Supple, thyroid normal.  Respiratory: Respiratory effort normal, BS equal bilaterally without rales, rhonchi, wheezing or stridor.  Cardio: RRR with no MRGs. Brisk peripheral pulses without edema.  Abdomen: Soft, + BS.  Non tender,  no guarding, rebound, hernias, masses. Lymphatics: Non tender without lymphadenopathy.  Musculoskeletal: Full ROM, 5/5 strength, normal gait.  Skin: Warm, dry without rashes, lesions, ecchymosis.  Neuro: Cranial nerves intact. Normal muscle tone, no cerebellar symptoms. Sensation intact.  Psych: Awake and oriented X 3, normal affect, Insight and Judgment appropriate.     Vicie Mutters, PA-C 3:17 PM [facility]

## 2018-03-03 ENCOUNTER — Telehealth: Payer: Self-pay | Admitting: *Deleted

## 2018-03-03 NOTE — Telephone Encounter (Signed)
Per Dr Melford Aase, a message was left to inform the patient she should take her BP medications daily, even if her readings are well within the normal. When she skips taking the medication, she has to play catch up later in the day, when her Bp is higher, per Dr Melford Aase.

## 2018-03-25 ENCOUNTER — Other Ambulatory Visit: Payer: Self-pay | Admitting: Internal Medicine

## 2018-03-31 ENCOUNTER — Other Ambulatory Visit: Payer: Self-pay | Admitting: Internal Medicine

## 2018-04-07 ENCOUNTER — Other Ambulatory Visit: Payer: Self-pay | Admitting: Internal Medicine

## 2018-04-07 DIAGNOSIS — I10 Essential (primary) hypertension: Secondary | ICD-10-CM

## 2018-04-17 ENCOUNTER — Other Ambulatory Visit: Payer: Self-pay | Admitting: Internal Medicine

## 2018-04-17 MED ORDER — AMOXICILLIN 250 MG PO CAPS: ORAL_CAPSULE | ORAL | 0 refills | Status: DC

## 2018-04-25 ENCOUNTER — Other Ambulatory Visit: Payer: Self-pay | Admitting: Internal Medicine

## 2018-04-30 ENCOUNTER — Ambulatory Visit: Payer: Self-pay | Admitting: Internal Medicine

## 2018-04-30 ENCOUNTER — Encounter: Payer: Self-pay | Admitting: Internal Medicine

## 2018-04-30 ENCOUNTER — Ambulatory Visit (INDEPENDENT_AMBULATORY_CARE_PROVIDER_SITE_OTHER): Payer: Medicare HMO | Admitting: Internal Medicine

## 2018-04-30 VITALS — BP 124/58 | HR 52 | Temp 97.3°F | Resp 16 | Ht 62.5 in | Wt 167.4 lb

## 2018-04-30 DIAGNOSIS — E559 Vitamin D deficiency, unspecified: Secondary | ICD-10-CM | POA: Diagnosis not present

## 2018-04-30 DIAGNOSIS — I10 Essential (primary) hypertension: Secondary | ICD-10-CM

## 2018-04-30 DIAGNOSIS — E039 Hypothyroidism, unspecified: Secondary | ICD-10-CM | POA: Diagnosis not present

## 2018-04-30 DIAGNOSIS — R7303 Prediabetes: Secondary | ICD-10-CM | POA: Diagnosis not present

## 2018-04-30 DIAGNOSIS — E782 Mixed hyperlipidemia: Secondary | ICD-10-CM

## 2018-04-30 DIAGNOSIS — R7309 Other abnormal glucose: Secondary | ICD-10-CM | POA: Diagnosis not present

## 2018-04-30 DIAGNOSIS — Z79899 Other long term (current) drug therapy: Secondary | ICD-10-CM | POA: Diagnosis not present

## 2018-04-30 DIAGNOSIS — Z23 Encounter for immunization: Secondary | ICD-10-CM

## 2018-04-30 NOTE — Progress Notes (Signed)
This very nice 84 y.o. WWF presents for 3 month follow up with HTN, HLD, Pre-Diabetes and Vitamin D Deficiency.      Patient is treated for HTN (1986) & BP has been controlled at home. Today's BP is at goal -  124/58. Patient has had no complaints of any cardiac type chest pain, palpitations, dyspnea / orthopnea / PND, dizziness, claudication, or dependent edema. In May 2018, she had an overnight hospitalization for possible TIA with all head scans negative. Patient is obsessive about her BP and calls frequently after hours for reassurance.      Hyperlipidemia is controlled with diet & meds. Patient denies myalgias or other med SE's. Last Lipids were at goal: Lab Results  Component Value Date   CHOL 158 10/10/2017   HDL 41 (L) 10/10/2017   LDLCALC 93 10/10/2017   TRIG 139 10/10/2017   CHOLHDL 3.9 10/10/2017      Also, the patient has history of  Obesity (BMI 30+) and PreDiabetes  (A1c 5.7% / 2014, then 6.1% in 2016 /2017, 6.0% in 2017 and 5.8% / 2018)   and has had no symptoms of reactive hypoglycemia, diabetic polys, paresthesias or visual blurring.  Last A1c was not at goal: Lab Results  Component Value Date   HGBA1C 6.1 (H) 01/20/2018      Patient has been on Thyroid Replacement since 1993.      Further, the patient also has history of Vitamin D Deficiency  ("36'" / 2009 and "29" / 2016)   and supplements vitamin D without any suspected side-effects. Last vitamin D was still very low (goal 70-100): Lab Results  Component Value Date   VD25OH 44 01/20/2018   Current Outpatient Medications on File Prior to Visit  Medication Sig  . acetaminophen (TYLENOL) 325 MG tablet Take 325 mg by mouth every 6 (six) hours as needed.  Marland Kitchen amLODipine (NORVASC) 10 MG tablet TAKE 1/2 TO 1 TABLET BY MOUTH DAILY FOR BP  . amoxicillin (AMOXIL) 250 MG capsule Take 1 capsule 3 x /day for infection  . aspirin 81 MG tablet Take 81 mg by mouth daily.    Marland Kitchen atenolol (TENORMIN) 50 MG tablet Take 1/2 to 1  tablet daily as directed for BP & Palpitations  . benazepril (LOTENSIN) 40 MG tablet TAKE 1 TABLET BY MOUTH ONCE DAILY  . Cholecalciferol (VITAMIN D3) 1000 units CAPS Take 1,000 Units by mouth 2 (two) times daily.  . clorazepate (TRANXENE) 7.5 MG tablet TAKE 1/2 TO 1 (ONE-HALF TO ONE) TABLET BY MOUTH TWICE DAILY TO  THREE  TIMES  DAILY  IF  NEEDED  FOR  ANXIETY  . fluticasone (FLONASE) 50 MCG/ACT nasal spray Place 2 sprays into both nostrils at bedtime.  . hydrochlorothiazide (HYDRODIURIL) 25 MG tablet TAKE 1 TABLET BY MOUTH ONCE DAILY FOR BLOOD PRESSURE AND  FLUID  . levothyroxine (SYNTHROID, LEVOTHROID) 150 MCG tablet TAKE 1 TABLET BY MOUTH ONCE DAILY  . meclizine (ANTIVERT) 25 MG tablet 1/2-1 pill up to 3 times daily for motion sickness/dizziness  . meloxicam (MOBIC) 15 MG tablet Take 1/2-1 daily with food once daily for 1 week, then as needed daily for pain (Dr. Melford Aase suggests trying this for your ear pain).   No current facility-administered medications on file prior to visit.    Allergies  Allergen Reactions  . Sulfa Antibiotics Other (See Comments)  . Asa [Aspirin] Other (See Comments)    Take 81mg  cannot take 325mg  gets nervous  . Codeine Nausea  Only  . Prednisone Nausea And Vomiting   PMHx:   Past Medical History:  Diagnosis Date  . Anemia   . CKD (chronic kidney disease) stage 3, GFR 30-59 ml/min (HCC)    pt states she no longer has this  . Colon polyp   . Diverticulosis   . Hyperlipidemia   . Hyperlipidemia   . Hypertension   . Hypothyroidism   . Iron deficiency   . Prediabetes   . Prediabetes   . Small bowel obstruction ([place])    Immunization History  Administered Date(s) Administered  . DT 03/22/2015  . Influenza Split 03/04/2013  . Influenza, High Dose Seasonal PF 03/16/2014, 03/22/2015, 02/15/2016, 04/23/2017, 04/30/2018  . Pneumococcal Polysaccharide-23 03/16/2014  . Td 10/10/2017   Past Surgical History:  Procedure Laterality Date  . CATARACT  EXTRACTION    . EYE SURGERY    . MACULAR TEAR    . Vericose vein  1960   FHx:    Reviewed / unchanged  SHx:    Reviewed / unchanged   Systems Review:  Constitutional: Denies fever, chills, wt changes, headaches, insomnia, fatigue, night sweats, change in appetite. Eyes: Denies redness, blurred vision, diplopia, discharge, itchy, watery eyes.  ENT: Denies discharge, congestion, post nasal drip, epistaxis, sore throat, earache, hearing loss, dental pain, tinnitus, vertigo, sinus pain, snoring.  CV: Denies chest pain, palpitations, irregular heartbeat, syncope, dyspnea, diaphoresis, orthopnea, PND, claudication or edema. Respiratory: denies cough, dyspnea, DOE, pleurisy, hoarseness, laryngitis, wheezing.  Gastrointestinal: Denies dysphagia, odynophagia, heartburn, reflux, water brash, abdominal pain or cramps, nausea, vomiting, bloating, diarrhea, constipation, hematemesis, melena, hematochezia  or hemorrhoids. Genitourinary: Denies dysuria, frequency, urgency, nocturia, hesitancy, discharge, hematuria or flank pain. Musculoskeletal: Denies arthralgias, myalgias, stiffness, jt. swelling, pain, limping or strain/sprain.  Skin: Denies pruritus, rash, hives, warts, acne, eczema or change in skin lesion(s). Neuro: No weakness, tremor, incoordination, spasms, paresthesia or pain. Psychiatric: Denies confusion, memory loss or sensory loss. Endo: Denies change in weight, skin or hair change.  Heme/Lymph: No excessive bleeding, bruising or enlarged lymph nodes.  Physical Exam  BP (!) 124/58   Pulse (!) 52   Temp (!) 97.3 F (36.3 C)   Resp 16   Ht 5' 2.5" (1.588 m)   Wt 167 lb 6.4 oz (75.9 kg)   BMI 30.13 kg/m   Appears  over nourished, well groomed  and in no distress.  Eyes: PERRLA, EOMs, conjunctiva no swelling or erythema. Sinuses: No frontal/maxillary tenderness ENT/Mouth: EAC's clear, TM's nl w/o erythema, bulging. Nares clear w/o erythema, swelling, exudates. Oropharynx clear  without erythema or exudates. Oral hygiene is good. Tongue normal, non obstructing. Hearing intact.  Neck: Supple. Thyroid not palpable. Car 2+/2+ without bruits, nodes or JVD. Chest: Respirations nl with BS clear & equal w/o rales, rhonchi, wheezing or stridor.  Cor: Heart sounds normal w/ regular rate and rhythm without sig. murmurs, gallops, clicks or rubs. Peripheral pulses normal and equal  without edema.  Abdomen: Soft & bowel sounds normal. Non-tender w/o guarding, rebound, hernias, masses or organomegaly.  Lymphatics: Unremarkable.  Musculoskeletal: Full ROM all peripheral extremities, joint stability, 5/5 strength and normal gait.  Skin: Warm, dry without exposed rashes, lesions or ecchymosis apparent.  Neuro: Cranial nerves intact, reflexes equal bilaterally. Sensory-motor testing grossly intact. Tendon reflexes grossly intact.  Pysch: Alert & oriented x 3.  Insight and judgement nl & appropriate. No ideations.  Assessment and Plan:  1. Essential hypertension  - Continue medication, monitor blood pressure at home.  - Continue DASH  diet.  Reminder to go to the ER if any CP,  SOB, nausea, dizziness, severe HA, changes vision/speech.  - CBC with Differential/Platelet - COMPLETE METABOLIC PANEL WITH GFR - Magnesium - TSH  2. Hyperlipidemia, mixed  - Continue diet/meds, exercise,& lifestyle modifications.  - Continue monitor periodic cholesterol/liver & renal functions   - TSH  3. Abnormal glucose  - Continue diet, exercise,  - lifestyle modifications.  - Monitor appropriate labs.  - Hemoglobin A1c - Insulin, random  4. Vitamin D deficiency  - Continue supplementation - VITAMIN D 25 Hydroxyl  5. Prediabetes  - Hemoglobin A1c - Insulin, random  6. Hypothyroidism  - TSH  7. Medication management  - CBC with Differential/Platelet - COMPLETE METABOLIC PANEL WITH GFR - Magnesium - TSH - Hemoglobin A1c - Insulin, random - VITAMIN D 25 Hydroxyl  8. Need  for prophylactic vaccination and inoculation against influenza - Flu vaccine HIGH DOSE PF (Fluzone High dose)       Discussed  regular exercise, BP monitoring, weight control to achieve/maintain BMI less than 25 and discussed med and SE's. Recommended labs to assess and monitor clinical status with further disposition pending results of labs. Over 30 minutes of exam, counseling, chart review was performed.

## 2018-04-30 NOTE — Patient Instructions (Signed)

## 2018-05-01 DIAGNOSIS — R7309 Other abnormal glucose: Secondary | ICD-10-CM | POA: Diagnosis not present

## 2018-05-01 DIAGNOSIS — E559 Vitamin D deficiency, unspecified: Secondary | ICD-10-CM | POA: Diagnosis not present

## 2018-05-01 DIAGNOSIS — I10 Essential (primary) hypertension: Secondary | ICD-10-CM | POA: Diagnosis not present

## 2018-05-01 DIAGNOSIS — E782 Mixed hyperlipidemia: Secondary | ICD-10-CM | POA: Diagnosis not present

## 2018-05-01 DIAGNOSIS — Z79899 Other long term (current) drug therapy: Secondary | ICD-10-CM | POA: Diagnosis not present

## 2018-05-01 DIAGNOSIS — R7303 Prediabetes: Secondary | ICD-10-CM | POA: Diagnosis not present

## 2018-05-01 DIAGNOSIS — E039 Hypothyroidism, unspecified: Secondary | ICD-10-CM | POA: Diagnosis not present

## 2018-05-02 ENCOUNTER — Other Ambulatory Visit: Payer: Self-pay | Admitting: Internal Medicine

## 2018-05-02 DIAGNOSIS — D649 Anemia, unspecified: Secondary | ICD-10-CM

## 2018-05-02 LAB — HEMOGLOBIN A1C
Hgb A1c MFr Bld: 6.1 % of total Hgb — ABNORMAL HIGH (ref <5.7)
Mean Plasma Glucose: 128 (calc)
eAG (mmol/L): 7.1 (calc)

## 2018-05-02 LAB — COMPLETE METABOLIC PANEL WITHOUT GFR
AG Ratio: 1.3 (calc) (ref 1.0–2.5)
Albumin: 3.9 g/dL (ref 3.6–5.1)
BUN/Creatinine Ratio: 16 (calc) (ref 6–22)
BUN: 20 mg/dL (ref 7–25)
Chloride: 99 mmol/L (ref 98–110)
GFR, Est African American: 44 mL/min/{1.73_m2} — ABNORMAL LOW (ref >=60)
Globulin: 2.9 g/dL (ref 1.9–3.7)
Sodium: 134 mmol/L — ABNORMAL LOW (ref 135–146)
Total Bilirubin: 0.4 mg/dL (ref 0.2–1.2)

## 2018-05-02 LAB — COMPLETE METABOLIC PANEL WITH GFR
ALT: 11 U/L (ref 6–29)
AST: 22 U/L (ref 10–35)
Alkaline phosphatase (APISO): 58 U/L (ref 33–130)
CO2: 26 mmol/L (ref 20–32)
Calcium: 9.5 mg/dL (ref 8.6–10.4)
Creat: 1.28 mg/dL — ABNORMAL HIGH (ref 0.60–0.88)
GFR, Est Non African American: 38 mL/min/{1.73_m2} — ABNORMAL LOW (ref >=60)
Glucose, Bld: 90 mg/dL (ref 65–99)
Potassium: 4.6 mmol/L (ref 3.5–5.3)
Total Protein: 6.8 g/dL (ref 6.1–8.1)

## 2018-05-02 LAB — CBC WITH DIFFERENTIAL/PLATELET
Basophils Absolute: 47 cells/uL (ref 0–200)
Basophils Relative: 0.6 %
Eosinophils Absolute: 229 {cells}/uL (ref 15–500)
Eosinophils Relative: 2.9 %
HCT: 30.4 % — ABNORMAL LOW (ref 35.0–45.0)
Hemoglobin: 9.9 g/dL — ABNORMAL LOW (ref 11.7–15.5)
Lymphs Abs: 1067 cells/uL (ref 850–3900)
MCH: 28.4 pg (ref 27.0–33.0)
MCHC: 32.6 g/dL (ref 32.0–36.0)
MCV: 87.4 fL (ref 80.0–100.0)
MPV: 11.3 fL (ref 7.5–12.5)
Monocytes Relative: 8.6 %
Neutro Abs: 5878 cells/uL (ref 1500–7800)
Neutrophils Relative %: 74.4 %
Platelets: 271 10*3/uL (ref 140–400)
RBC: 3.48 10*6/uL — ABNORMAL LOW (ref 3.80–5.10)
RDW: 12.8 % (ref 11.0–15.0)
Total Lymphocyte: 13.5 %
WBC mixed population: 679 {cells}/uL (ref 200–950)
WBC: 7.9 10*3/uL (ref 3.8–10.8)

## 2018-05-02 LAB — TSH: TSH: 1.25 mIU/L (ref 0.40–4.50)

## 2018-05-02 LAB — VITAMIN D 25 HYDROXY (VIT D DEFICIENCY, FRACTURES): Vit D, 25-Hydroxy: 87 ng/mL (ref 30–100)

## 2018-05-02 LAB — INSULIN, RANDOM: Insulin: 10.6 u[IU]/mL (ref 2.0–19.6)

## 2018-05-02 LAB — MAGNESIUM: Magnesium: 1.8 mg/dL (ref 1.5–2.5)

## 2018-05-05 ENCOUNTER — Other Ambulatory Visit: Payer: Self-pay | Admitting: Internal Medicine

## 2018-05-14 ENCOUNTER — Encounter: Payer: Self-pay | Admitting: Hematology and Oncology

## 2018-05-14 ENCOUNTER — Telehealth: Payer: Self-pay | Admitting: Hematology and Oncology

## 2018-05-14 NOTE — Telephone Encounter (Signed)
New referral received from Dr. Melford Aase for anemia. Pt has been cld and scheduled to see Dr. Lindi Adie on 12/31 at 1pm. Pt agreed to the appt date and time. Letter mailed.

## 2018-05-29 NOTE — Progress Notes (Signed)
[name] NOTE  Patient Care Team: [name], MD as PCP - General (Internal Medicine)  CHIEF COMPLAINTS/PURPOSE OF CONSULTATION: Anemia  HISTORY OF PRESENTING ILLNESS:  Mikayla Harrison 84 y.o. female is here because of recent diagnosis of anemia. She was referred by Dr. Melford Harrison. Her most recent labs from 05/01/18 show Hg 9.9. She presents to the clinic today alone and reports a history of anemia symptoms-- fatigue, dizziness and SOB in addition to heart palpitations. She denies smoking or drinking, but notes a family history of high blood pressure. She reports she first was diagnosed with anemia when she had her first child and she had a blood transfusion. She has previously taken oral iron tablets from her PCP. She has never been told of a history of kidney issues. She reports she has taken thyroid medication for years. She expressed concern about her kidney function. She is a Oceanographer. She reviewed her medication list with me.   I reviewed her records extensively and collaborated the history with the patient.  REVIEW OF SYSTEMS:   Constitutional: Denies fevers, chills or abnormal night sweats (+) fatigue (+) dizziness Eyes: Denies blurriness of vision, double vision or watery eyes Ears, nose, mouth, throat, and face: Denies mucositis or sore throat Respiratory: Denies cough or wheezes (+) SOB Cardiovascular: Denies chest discomfort or lower extremity swelling (+) heart palpitations Gastrointestinal:  Denies nausea, heartburn or change in bowel habits Skin: Denies abnormal skin rashes Lymphatics: Denies new lymphadenopathy or easy bruising Neurological:Denies numbness, tingling or new weaknesses Behavioral/Psych: Mood is stable, no new changes  All other systems were reviewed with the patient and are negative.  MEDICAL HISTORY:  Past Medical History:  Diagnosis Date  . Anemia   . CKD (chronic kidney disease) stage 3, GFR 30-59 ml/min (HCC)    pt  states she no longer has this  . Colon polyp   . Diverticulosis   . Hyperlipidemia   . Hyperlipidemia   . Hypertension   . Hypothyroidism   . Iron deficiency   . Prediabetes   . Prediabetes   . Small bowel obstruction ([place])     SURGICAL HISTORY: Past Surgical History:  Procedure Laterality Date  . CATARACT EXTRACTION    . EYE SURGERY    . MACULAR TEAR    . Vericose vein  1960    SOCIAL HISTORY: Social History   Socioeconomic History  . Marital status: Widowed    Spouse name: Not on file  . Number of children: Not on file  . Years of education: Not on file  . Highest education level: Not on file  Occupational History  . Not on file  Social Needs  . Financial resource strain: Not on file  . Food insecurity:    Worry: Not on file    Inability: Not on file  . Transportation needs:    Medical: Not on file    Non-medical: Not on file  Tobacco Use  . Smoking status: Former Smoker    Packs/day: 1.00    Years: 15.00    Pack years: 15.00    Last attempt to quit: 06/11/1965    Years since quitting: 53.0  . Smokeless tobacco: Never Used  Substance and Sexual Activity  . Alcohol use: No  . Drug use: No  . Sexual activity: Not on file  Lifestyle  . Physical activity:    Days per week: Not on file    Minutes per session: Not on file  . Stress:  Not on file  Relationships  . Social connections:    Talks on phone: Not on file    Gets together: Not on file    Attends religious service: Not on file    Active member of club or organization: Not on file    Attends meetings of clubs or organizations: Not on file    Relationship status: Not on file  . Intimate partner violence:    Fear of current or ex partner: Not on file    Emotionally abused: Not on file    Physically abused: Not on file    Forced sexual activity: Not on file  Other Topics Concern  . Not on file  Social History Narrative  . Not on file    FAMILY HISTORY: Family History  Problem Relation Age of  Onset  . Heart disease Mother   . Stroke Mother   . Heart disease Father   . Colon polyps Sister   . Diabetes Sister   . Stroke Sister   . Heart disease Brother   . Stroke Brother     ALLERGIES:  is allergic to sulfa antibiotics; asa [aspirin]; codeine; and prednisone.  MEDICATIONS:  Current Outpatient Medications  Medication Sig Dispense Refill  . acetaminophen (TYLENOL) 325 MG tablet Take 325 mg by mouth every 6 (six) hours as needed.    Marland Kitchen amLODipine (NORVASC) 10 MG tablet TAKE 1/2 TO 1 TABLET BY MOUTH DAILY FOR BP 90 tablet 3  . amoxicillin (AMOXIL) 250 MG capsule Take 1 capsule 3 x /day for infection 21 capsule 0  . aspirin 81 MG tablet Take 81 mg by mouth daily.      Marland Kitchen atenolol (TENORMIN) 50 MG tablet Take 1/2 to 1 tablet daily as directed for BP & Palpitations 90 tablet 1  . benazepril (LOTENSIN) 40 MG tablet TAKE 1 TABLET BY MOUTH ONCE DAILY 90 tablet 1  . Cholecalciferol (VITAMIN D3) 1000 units CAPS Take 1,000 Units by mouth 2 (two) times daily.    . clorazepate (TRANXENE) 7.5 MG tablet TAKE 1/2 TO 1 (ONE-HALF TO ONE) TABLET BY MOUTH 2 TO 3 TIMES DAILY AS NEEDED FOR ANXIETY 90 tablet 0  . hydrochlorothiazide (HYDRODIURIL) 25 MG tablet TAKE 1 TABLET BY MOUTH ONCE DAILY FOR BLOOD PRESSURE AND  FLUID 90 tablet 1  . levothyroxine (SYNTHROID, LEVOTHROID) 150 MCG tablet TAKE 1 TABLET BY MOUTH ONCE DAILY 90 tablet 1  . meclizine (ANTIVERT) 25 MG tablet 1/2-1 pill up to 3 times daily for motion sickness/dizziness 30 tablet 0   No current facility-administered medications for this visit.    PHYSICAL EXAMINATION: ECOG PERFORMANCE STATUS: 1 - Symptomatic but completely ambulatory  Vitals:   06/10/18 1253  BP: (!) 146/68  Pulse: (!) 56  Resp: 18  Temp: 98.3 F (36.8 C)  SpO2: 100%   Filed Weights   06/10/18 1253  Weight: 168 lb 1.6 oz (76.2 kg)    GENERAL:alert, no distress and comfortable SKIN: skin color, texture, turgor are normal, no rashes or significant  lesions EYES: normal, conjunctiva are pink and non-injected, sclera clear OROPHARYNX:no exudate, no erythema and lips, buccal mucosa, and tongue normal  NECK: supple, thyroid normal size, non-tender, without nodularity LYMPH:  no palpable lymphadenopathy in the cervical, axillary or inguinal LUNGS: clear to auscultation and percussion with normal breathing effort HEART: regular rate & rhythm and no murmurs and no lower extremity edema ABDOMEN:abdomen soft, non-tender and normal bowel sounds Musculoskeletal:no cyanosis of digits and no clubbing  PSYCH: alert &  oriented x 3 with fluent speech NEURO: no focal motor/sensory deficits  LABORATORY DATA:  I have reviewed the data as listed Lab Results  Component Value Date   WBC 7.9 05/01/2018   HGB 9.9 (L) 05/01/2018   HCT 30.4 (L) 05/01/2018   MCV 87.4 05/01/2018   PLT 271 05/01/2018   Lab Results  Component Value Date   NA 134 (L) 05/01/2018   K 4.6 05/01/2018   CL 99 05/01/2018   CO2 26 05/01/2018    RADIOGRAPHIC STUDIES: I have personally reviewed the radiological reports and agreed with the findings in the report.  ASSESSMENT AND PLAN:  Normocytic anemia Differential diagnosis: 1. Anemia due to chronic disease and inflammation 2. anemia due to renal dysfunction 3. Combined B-12 and iron deficiency anemias 4. Hemolysis 5. Hypothyroidism 6. Plasma cell disorders myeloma 7. Bone marrow dysfunction with MDS  Workup performed: 1. CBC with differential to evaluate the smear 2. CMP to evaluate liver and kidney function 3. Haptoglobin, LDH, reticulocyte count to evaluate hemolysis 4. SPEP 6. Iron and H-47 and folic acid levels   I discussed with the patient that she has chronic kidney disease and that is the most likely cause of her anemia.  If the erythropoietin levels are below 50 and the patient runs consistent hemoglobin is below 10 then we can initiate erythropoietin stimulating therapy with Aranesp every 4  weeks.  Patient wants to discuss with Dr. Melford Harrison about her kidney function and blood pressure issues as well as her blood pressure medications.  Return to clinic in 1 week to review the blood work and follow-up.   All questions were answered. The patient knows to call the clinic with any problems, questions or concerns.   Nicholas Lose, MD 06/10/2018   I, Cloyde Reams Dorshimer, am acting as scribe for Nicholas Lose, MD.  I have reviewed the above documentation for accuracy and completeness, and I agree with the above.

## 2018-06-05 ENCOUNTER — Encounter: Payer: Self-pay | Admitting: Adult Health

## 2018-06-10 ENCOUNTER — Telehealth: Payer: Self-pay | Admitting: Hematology and Oncology

## 2018-06-10 ENCOUNTER — Inpatient Hospital Stay: Payer: Medicare HMO

## 2018-06-10 ENCOUNTER — Inpatient Hospital Stay: Payer: Medicare HMO | Attending: Hematology and Oncology | Admitting: Hematology and Oncology

## 2018-06-10 VITALS — BP 146/68 | HR 56 | Temp 98.3°F | Resp 18 | Ht 62.5 in | Wt 168.1 lb

## 2018-06-10 DIAGNOSIS — N183 Chronic kidney disease, stage 3 unspecified: Secondary | ICD-10-CM

## 2018-06-10 DIAGNOSIS — N189 Chronic kidney disease, unspecified: Secondary | ICD-10-CM | POA: Insufficient documentation

## 2018-06-10 DIAGNOSIS — D649 Anemia, unspecified: Secondary | ICD-10-CM | POA: Diagnosis not present

## 2018-06-10 DIAGNOSIS — I129 Hypertensive chronic kidney disease with stage 1 through stage 4 chronic kidney disease, or unspecified chronic kidney disease: Secondary | ICD-10-CM | POA: Diagnosis not present

## 2018-06-10 LAB — RETICULOCYTES
Immature Retic Fract: 5 % (ref 2.3–15.9)
RBC.: 3.69 MIL/uL — ABNORMAL LOW (ref 3.87–5.11)
Retic Count, Absolute: 34.3 10*3/uL (ref 19.0–186.0)
Retic Ct Pct: 0.9 % (ref 0.4–3.1)

## 2018-06-10 LAB — CBC WITH DIFFERENTIAL (CANCER CENTER ONLY)
Abs Immature Granulocytes: 0.02 10*3/uL (ref 0.00–0.07)
Basophils Absolute: 0.1 10*3/uL (ref 0.0–0.1)
Basophils Relative: 1 %
Eosinophils Absolute: 0.2 10*3/uL (ref 0.0–0.5)
Eosinophils Relative: 3 %
HCT: 33.1 % — ABNORMAL LOW (ref 36.0–46.0)
Hemoglobin: 10.5 g/dL — ABNORMAL LOW (ref 12.0–15.0)
Immature Granulocytes: 0 %
Lymphocytes Relative: 22 %
Lymphs Abs: 1.6 10*3/uL (ref 0.7–4.0)
MCH: 28.5 pg (ref 26.0–34.0)
MCHC: 31.7 g/dL (ref 30.0–36.0)
MCV: 89.7 fL (ref 80.0–100.0)
Monocytes Absolute: 0.7 10*3/uL (ref 0.1–1.0)
Monocytes Relative: 9 %
Neutro Abs: 4.9 10*3/uL (ref 1.7–7.7)
Neutrophils Relative %: 65 %
Platelet Count: 243 10*3/uL (ref 150–400)
RBC: 3.69 MIL/uL — ABNORMAL LOW (ref 3.87–5.11)
RDW: 13.2 % (ref 11.5–15.5)
WBC Count: 7.5 10*3/uL (ref 4.0–10.5)
nRBC: 0 % (ref 0.0–0.2)

## 2018-06-10 LAB — IRON AND TIBC
Iron: 66 ug/dL (ref 41–142)
Saturation Ratios: 20 % — ABNORMAL LOW (ref 21–57)
TIBC: 334 ug/dL (ref 236–444)
UIBC: 268 ug/dL (ref 120–384)

## 2018-06-10 LAB — VITAMIN B12: Vitamin B-12: 341 pg/mL (ref 180–914)

## 2018-06-10 LAB — CMP (CANCER CENTER ONLY)
ALT: 18 U/L (ref 0–44)
AST: 27 U/L (ref 15–41)
Albumin: 3.9 g/dL (ref 3.5–5.0)
Alkaline Phosphatase: 68 U/L (ref 38–126)
Anion gap: 11 (ref 5–15)
BUN: 28 mg/dL — ABNORMAL HIGH (ref 8–23)
CO2: 26 mmol/L (ref 22–32)
Calcium: 9.3 mg/dL (ref 8.9–10.3)
Chloride: 101 mmol/L (ref 98–111)
Creatinine: 1.34 mg/dL — ABNORMAL HIGH (ref 0.44–1.00)
GFR, Est AFR Am: 42 mL/min — ABNORMAL LOW (ref >60)
GFR, Estimated: 36 mL/min — ABNORMAL LOW (ref >60)
Glucose, Bld: 98 mg/dL (ref 70–99)
Potassium: 4.2 mmol/L (ref 3.5–5.1)
Sodium: 138 mmol/L (ref 135–145)
Total Bilirubin: 0.3 mg/dL (ref 0.3–1.2)
Total Protein: 7.7 g/dL (ref 6.5–8.1)

## 2018-06-10 LAB — FOLATE: Folate: 28.7 ng/mL (ref >5.9)

## 2018-06-10 LAB — LACTATE DEHYDROGENASE: LDH: 218 U/L — ABNORMAL HIGH (ref 98–192)

## 2018-06-10 LAB — FERRITIN: Ferritin: 36 ng/mL (ref 11–307)

## 2018-06-10 NOTE — Assessment & Plan Note (Signed)
Differential diagnosis: 1. Anemia due to chronic disease and inflammation 2. anemia due to renal dysfunction 3. Combined B-12 and iron deficiency anemias 4. Hemolysis 5. Hypothyroidism 6. Plasma cell disorders myeloma 7. Bone marrow dysfunction with MDS  Workup performed: 1. CBC with differential to evaluate the smear 2. CMP to evaluate liver and kidney function 3. Haptoglobin, LDH, reticulocyte count to evaluate hemolysis 4. SPEP 6. Iron and K-16 and folic acid levels   I discussed with the patient that she has chronic kidney disease and that is the most likely cause of her anemia.  If the erythropoietin levels are below 50 and the patient runs consistent hemoglobin is below 10 then we can initiate erythropoietin stimulating therapy with Aranesp every 4 weeks.  Patient wants to discuss with Dr. Rana Snare about her kidney function and blood pressure issues as well as her blood pressure medications.  Return to clinic in 1 week to review the blood work and follow-up.

## 2018-06-10 NOTE — Telephone Encounter (Signed)
Gave avs and calendar ° °

## 2018-06-11 LAB — HAPTOGLOBIN: Haptoglobin: 156 mg/dL (ref 41–333)

## 2018-06-12 LAB — MULTIPLE MYELOMA PANEL, SERUM
Albumin SerPl Elph-Mcnc: 3.6 g/dL (ref 2.9–4.4)
Albumin/Glob SerPl: 1.1 (ref 0.7–1.7)
Alpha 1: 0.3 g/dL (ref 0.0–0.4)
Alpha2 Glob SerPl Elph-Mcnc: 0.9 g/dL (ref 0.4–1.0)
B-Globulin SerPl Elph-Mcnc: 1 g/dL (ref 0.7–1.3)
Gamma Glob SerPl Elph-Mcnc: 1.3 g/dL (ref 0.4–1.8)
Globulin, Total: 3.4 g/dL (ref 2.2–3.9)
IgA: 233 mg/dL (ref 64–422)
IgG (Immunoglobin G), Serum: 1386 mg/dL (ref 700–1600)
IgM (Immunoglobulin M), Srm: 125 mg/dL (ref 26–217)
Total Protein ELP: 7 g/dL (ref 6.0–8.5)

## 2018-06-12 LAB — ERYTHROPOIETIN: Erythropoietin: 8.7 m[IU]/mL (ref 2.6–18.5)

## 2018-06-17 NOTE — Progress Notes (Signed)
Patient Care Team: [name], MD as PCP - General (Internal Medicine)  DIAGNOSIS:    ICD-10-CM   1. Normocytic anemia D64.9 CBC with Differential ([facility] Only)    Reticulocytes    CMP ([facility] only)    CHIEF COMPLIANT: Follow-up of anemia and recent blood work  INTERVAL HISTORY: Mikayla Harrison is a 85 y.o. with above-mentioned history of anemia due to chronic kidney disease. She presents to the clinic alone today. Her recent blood work shows iron saturation 20%, folate 28.7, creatinine 1.34, erythropoietin 8.7, haptoglobin 156, vitamin B-12 341, Hg 10.5. She questioned whether she should continue B-12 supplements, and notes she drinks milk and eats chicken and fish, and whether her kidney disease will progress. She notes she has stopped taking her diuretic pills.   REVIEW OF SYSTEMS:   Constitutional: Denies fevers, chills or abnormal weight loss Eyes: Denies blurriness of vision Ears, nose, mouth, throat, and face: Denies mucositis or sore throat Respiratory: Denies cough, dyspnea or wheezes Cardiovascular: Denies palpitation, chest discomfort Gastrointestinal:  Denies nausea, heartburn or change in bowel habits Skin: Denies abnormal skin rashes Lymphatics: Denies new lymphadenopathy or easy bruising Neurological:Denies numbness, tingling or new weaknesses Behavioral/Psych: Mood is stable, no new changes  Extremities: No lower extremity edema All other systems were reviewed with the patient and are negative.  I have reviewed the past medical history, past surgical history, social history and family history with the patient and they are unchanged from previous note.  ALLERGIES:  is allergic to sulfa antibiotics; asa [aspirin]; codeine; and prednisone.  MEDICATIONS:  Current Outpatient Medications  Medication Sig Dispense Refill  . acetaminophen (TYLENOL) 325 MG tablet Take 325 mg by mouth every 6 (six) hours as needed.    Marland Kitchen amLODipine (NORVASC) 10 MG tablet  TAKE 1/2 TO 1 TABLET BY MOUTH DAILY FOR BP 90 tablet 3  . amoxicillin (AMOXIL) 250 MG capsule Take 1 capsule 3 x /day for infection 21 capsule 0  . aspirin 81 MG tablet Take 81 mg by mouth daily.      Marland Kitchen atenolol (TENORMIN) 50 MG tablet Take 1/2 to 1 tablet daily as directed for BP & Palpitations 90 tablet 1  . benazepril (LOTENSIN) 40 MG tablet TAKE 1 TABLET BY MOUTH ONCE DAILY 90 tablet 1  . Cholecalciferol (VITAMIN D3) 1000 units CAPS Take 1,000 Units by mouth 2 (two) times daily.    . clorazepate (TRANXENE) 7.5 MG tablet TAKE 1/2 TO 1 (ONE-HALF TO ONE) TABLET BY MOUTH 2 TO 3 TIMES DAILY AS NEEDED FOR ANXIETY 90 tablet 0  . hydrochlorothiazide (HYDRODIURIL) 25 MG tablet TAKE 1 TABLET BY MOUTH ONCE DAILY FOR BLOOD PRESSURE AND  FLUID 90 tablet 1  . levothyroxine (SYNTHROID, LEVOTHROID) 150 MCG tablet TAKE 1 TABLET BY MOUTH ONCE DAILY 90 tablet 1  . meclizine (ANTIVERT) 25 MG tablet 1/2-1 pill up to 3 times daily for motion sickness/dizziness 30 tablet 0   No current facility-administered medications for this visit.     PHYSICAL EXAMINATION: ECOG PERFORMANCE STATUS: 1 - Symptomatic but completely ambulatory  Vitals:   06/18/18 1433  BP: 135/62  Pulse: (!) 58  Resp: 14  Temp: 98.7 F (37.1 C)  SpO2: 100%   Filed Weights   06/18/18 1433  Weight: 167 lb 4.8 oz (75.9 kg)    GENERAL:alert, no distress and comfortable SKIN: skin color, texture, turgor are normal, no rashes or significant lesions EYES: normal, Conjunctiva are pink and non-injected, sclera clear OROPHARYNX:no exudate, no  erythema and lips, buccal mucosa, and tongue normal  NECK: supple, thyroid normal size, non-tender, without nodularity LYMPH:  no palpable lymphadenopathy in the cervical, axillary or inguinal LUNGS: clear to auscultation and percussion with normal breathing effort HEART: regular rate & rhythm and no murmurs and no lower extremity edema ABDOMEN:abdomen soft, non-tender and normal bowel  sounds MUSCULOSKELETAL:no cyanosis of digits and no clubbing  NEURO: alert & oriented x 3 with fluent speech, no focal motor/sensory deficits EXTREMITIES: No lower extremity edema  LABORATORY DATA:  I have reviewed the data as listed CMP Latest Ref Rng & Units 06/10/2018 05/01/2018 01/20/2018  Glucose 70 - 99 mg/dL 98 90 94  BUN 8 - 23 mg/dL 28(H) 20 28(H)  Creatinine 0.44 - 1.00 mg/dL 1.34(H) 1.28(H) 1.23(H)  Sodium 135 - 145 mmol/L 138 134(L) 131(L)  Potassium 3.5 - 5.1 mmol/L 4.2 4.6 5.0  Chloride 98 - 111 mmol/L 101 99 96(L)  CO2 22 - 32 mmol/L 26 26 26   Calcium 8.9 - 10.3 mg/dL 9.3 9.5 9.1  Total Protein 6.5 - 8.1 g/dL 7.7 6.8 6.8  Total Bilirubin 0.3 - 1.2 mg/dL 0.3 0.4 0.3  Alkaline Phos 38 - 126 U/L 68 - -  AST 15 - 41 U/L 27 22 21   ALT 0 - 44 U/L 18 11 11     Lab Results  Component Value Date   WBC 7.5 06/10/2018   HGB 10.5 (L) 06/10/2018   HCT 33.1 (L) 06/10/2018   MCV 89.7 06/10/2018   PLT 243 06/10/2018   NEUTROABS 4.9 06/10/2018    ASSESSMENT & PLAN:  Normocytic anemia Lab review:  Iron studies: Iron saturation 20%, ferritin 36 L57 and folic acid normal SPEP: No M protein Serum creatinine: 1.34, BUN 28 Haptoglobin, reticulocyte count, LDH did not show evidence of hemolysis Erythropoietin: 8.7 (inappropriately low) Hemoglobin: 10.5  I discussed with her the results of this test and I provided her with a copy of the result. The diagnosis is anemia of chronic kidney disease stage III. Since her hemoglobin is more than 10 she does not need immediate erythropoietin replacement therapy. Only if she has persistent low hemoglobin is below 10 then we will initiate ESA therapy.  Return to clinic in 6 months with review of labs and follow-up.     Orders Placed This Encounter  Procedures  . CBC with Differential (Cancer Center Only)    Standing Status:   Future    Standing Expiration Date:   06/19/2019  . Reticulocytes    Standing Status:   Future    Standing  Expiration Date:   06/19/2019  . CMP ([facility] only)    Standing Status:   Future    Standing Expiration Date:   06/19/2019   The patient has a good understanding of the overall plan. she agrees with it. she will call with any problems that may develop before the next visit here.  Nicholas Lose, MD 06/18/2018   I, Cloyde Reams Dorshimer, am acting as scribe for Nicholas Lose, MD.  I have reviewed the above documentation for accuracy and completeness, and I agree with the above.

## 2018-06-18 ENCOUNTER — Telehealth: Payer: Self-pay | Admitting: Hematology and Oncology

## 2018-06-18 ENCOUNTER — Inpatient Hospital Stay: Payer: Medicare HMO | Attending: Hematology and Oncology | Admitting: Hematology and Oncology

## 2018-06-18 ENCOUNTER — Ambulatory Visit: Payer: Self-pay | Admitting: Internal Medicine

## 2018-06-18 VITALS — BP 135/62 | HR 58 | Temp 98.7°F | Resp 14 | Ht 62.5 in | Wt 167.3 lb

## 2018-06-18 DIAGNOSIS — D649 Anemia, unspecified: Secondary | ICD-10-CM | POA: Diagnosis not present

## 2018-06-18 DIAGNOSIS — Z79899 Other long term (current) drug therapy: Secondary | ICD-10-CM | POA: Insufficient documentation

## 2018-06-18 DIAGNOSIS — Z7982 Long term (current) use of aspirin: Secondary | ICD-10-CM | POA: Diagnosis not present

## 2018-06-18 NOTE — Assessment & Plan Note (Signed)
Lab review:  Iron studies: Iron saturation 20%, ferritin 36 N96 and folic acid normal SPEP: No M protein Serum creatinine: 1.34, BUN 28 Haptoglobin, reticulocyte count, LDH did not show evidence of hemolysis Erythropoietin: 8.7 (inappropriately low) Hemoglobin: 10.5  I discussed with her the results of this test and I provided her with a copy of the result. The diagnosis is anemia of chronic kidney disease stage III. Since her hemoglobin is more than 10 she does not need immediate erythropoietin replacement therapy. Only if she has persistent low hemoglobin is below 10 then we will initiate ESA therapy.  Return to clinic in 6 months with review of labs and follow-up.

## 2018-06-18 NOTE — Telephone Encounter (Signed)
Gave avs and calendar ° °

## 2018-06-18 NOTE — Telephone Encounter (Signed)
Gave avs and calendar ° °

## 2018-06-21 ENCOUNTER — Other Ambulatory Visit: Payer: Self-pay | Admitting: Internal Medicine

## 2018-06-21 DIAGNOSIS — R002 Palpitations: Secondary | ICD-10-CM

## 2018-06-21 DIAGNOSIS — I10 Essential (primary) hypertension: Secondary | ICD-10-CM

## 2018-06-29 ENCOUNTER — Encounter: Payer: Self-pay | Admitting: Internal Medicine

## 2018-06-29 NOTE — Patient Instructions (Addendum)
Chronic Kidney Disease, Adult  Chronic kidney disease (CKD) occurs when the kidneys become damaged slowly over a long period of time. The kidneys are a pair of organs that do many important jobs in the body, including:  Removing waste and extra fluid from the blood to make urine.  Making hormones that maintain the amount of fluid in tissues and blood vessels.  Maintaining the right amount of fluids and chemicals in the body. A small amount of kidney damage may not cause problems, but a large amount of damage may make it hard or impossible for the kidneys to work the way they should. If steps are not taken to slow down kidney damage or to stop it from getting worse, the kidneys may stop working permanently (end-stage renal disease or ESRD). Most of the time, CKD does not go away, but it can often be controlled. People who have CKD are usually able to live normal lives. What are the causes? The most common causes of this condition are diabetes and high blood pressure (hypertension). Other causes include:  Heart and blood vessel (cardiovascular) disease.  Kidney diseases, such as: ? Glomerulonephritis. ? Interstitial nephritis. ? Polycystic kidney disease. ? Renal vascular disease.  Diseases that affect the immune system.  Genetic diseases.  Medicines that damage the kidneys, such as anti-inflammatory medicines.  Being around or being in contact with poisonous (toxic) substances.  A kidney or urinary infection that occurs again and again (recurs).  Vasculitis. This is swelling or inflammation of the blood vessels.  A problem with urine flow that may be caused by: ? Cancer. ? Having kidney stones more than one time. ? An enlarged prostate, in males. What increases the risk? You are more likely to develop this condition if you:  Are older than age 66.  Are female.  Are African-American, Hispanic, Asian, [place], or American [place].  Are a current or former  smoker.  Are obese.  Have a family history of kidney disease or failure.  Often take medicines that are damaging to the kidneys. What are the signs or symptoms? Symptoms of this condition include:  Swelling (edema) of the face, legs, ankles, or feet.  Tiredness (lethargy) and having less energy.  Nausea or vomiting.  Confusion or trouble concentrating.  Problems with urination, such as: ? Painful or burning feeling during urination. ? Decreased urine production. ? Frequent urination, especially at night. ? Bloody urine.  Muscle twitches and cramps, especially in the legs.  Shortness of breath.  Weakness.  Loss of appetite.  Metallic taste in the mouth.  Trouble sleeping.  Dry, itchy skin.  A low blood count (anemia).  Pale lining of the eyelids and surface of the eye (conjunctiva). Symptoms develop slowly and may not be obvious until the kidney damage becomes severe. It is possible to have kidney disease for years without having any symptoms. How is this diagnosed? This condition may be diagnosed based on:  Blood tests.  Urine tests.  Imaging tests, such as an ultrasound or CT scan.  A test in which a sample of tissue is removed from the kidneys to be examined under a microscope (kidney biopsy). These test results will help your health care provider determine how serious the CKD is. How is this treated? There is no cure for most cases of this condition, but treatment usually relieves symptoms and prevents or slows the progression of the disease. Treatment may include:  Making diet changes, which may require you to avoid alcohol, salty foods (  sodium), and foods that are high in potassium, calcium, and protein.  Medicines: ? To lower blood pressure. ? To control blood glucose. ? To relieve anemia. ? To relieve swelling. ? To protect your bones. ? To improve the balance of electrolytes in your blood.  Removing toxic waste from the body through types of  dialysis, if the kidneys can no longer do their job (kidney failure).  Managing any other conditions that are causing your CKD or making it worse. Follow these instructions at home: Medicines  Take over-the-counter and prescription medicines only as told by your health care provider. The dose of some medicines that you take may need to be adjusted.  Do not take any new medicines unless approved by your health care provider. Many medicines can worsen your kidney damage.  Do not take any vitamin and mineral supplements unless approved by your health care provider. Many nutritional supplements can worsen your kidney damage. General instructions  Follow your prescribed diet as told by your health care provider.  Do not use any products that contain nicotine or tobacco, such as cigarettes and e-cigarettes. If you need help quitting, ask your health care provider.  Monitor and track your blood pressure at home. Report changes in your blood pressure as told by your health care provider.  If you are being treated for diabetes, monitor and track your blood sugar (blood glucose) levels as told by your health care provider.  Maintain a healthy weight. If you need help with this, ask your health care provider.  Start or continue an exercise plan. Exercise at least 30 minutes a day, 5 days a week.  Keep your immunizations up to date as told by your health care provider.  Keep all follow-up visits as told by your health care provider. This is important. Where to find more information  American Association of Kidney Patients: BombTimer.gl  National Kidney Foundation: www.kidney.[place]: https://mathis.com/  [facility]: www.lifeoptions.org and www.kidneyschool.org Contact a health care provider if:  Your symptoms get worse.  You develop new symptoms. Get help right away if:  You develop symptoms of ESRD, which include: ? Headaches. ? Numbness in the  hands or feet. ? Easy bruising. ? Frequent hiccups. ? Chest pain. ? Shortness of breath. ? Lack of menstruation, in women.  You have a fever.  You have decreased urine production.  You have pain or bleeding when you urinate. Summary  Chronic kidney disease (CKD) occurs when the kidneys become damaged slowly over a long period of time.  The most common causes of this condition are diabetes and high blood pressure (hypertension).  There is no cure for most cases of this condition, but treatment usually relieves symptoms and prevents or slows the progression of the disease. Treatment may include a combination of medicines and lifestyle changes. ++++++++++++++++++++++++++++  Food Basics for Chronic Kidney Disease When your kidneys are not working well, they cannot remove waste and excess substances from your blood as effectively as they did before. This can lead to a buildup and imbalance of these substances, which can worsen kidney damage and affect how your body functions. Certain foods lead to a buildup of these substances in the body. By changing your diet as recommended by your diet and nutrition specialist (dietitian) or health care provider, you could help prevent further kidney damage and delay or prevent the need for dialysis. What are tips for following this plan? General instructions   Work with your health care  provider and dietitian to develop a meal plan that is right for you. Foods you can eat, limit, or avoid will be different for each person depending on the stage of kidney disease and any other existing health conditions.  Talk with your health care provider about whether you should take a vitamin and mineral supplement.  Use standard measuring cups and spoons to measure servings of foods. Use a kitchen scale to measure portions of protein foods.  If directed by your health care provider, avoid drinking too much fluid. Measure and count all liquids, including water,  ice, soups, flavored gelatin, and frozen desserts such as popsicles or ice cream. Reading food labels  Check the amount of sodium in foods. Choose foods that have less than 300 milligrams (mg) per serving.  Check the ingredient list for phosphorus or potassium-based additives or preservatives.  Check the amount of saturated and trans fat. Limit or avoid these fats as told by your dietitian. Shopping  Avoid buying foods that are: ? Processed, frozen, or prepackaged. ? Calcium-enriched or fortified.  Do not buy foods that have salt or sodium listed among the first five ingredients.  Do not buy canned vegetables. Cooking  Replace animal proteins, such as meat, fish, eggs, or dairy, with plant proteins from beans, nuts, and soy. ? Use soy milk instead of cow's milk. ? Add beans or tofu to soups, casseroles, or pasta dishes instead of meat.  Soak vegetables, such as potatoes, before cooking to reduce potassium. To do this: ? Peel and cut into small pieces. ? Soak in warm water for at least 2 hours. For every 1 cup of vegetables, use 10 cups of water. ? Drain and rinse with warm water. ? Boil for at least 5 minutes. Meal planning  Limit the amount of protein from plant and animal sources you eat each day.  Do not add salt to food when cooking or before eating.  Eat meals and snacks at around the same time each day. If you have diabetes:  If you have diabetes (diabetes mellitus) and chronic kidney disease, it is important to keep your blood glucose in the target range recommended by your health care provider. Follow your diabetes management plan. This may include: ? Checking your blood glucose regularly. ? Taking oral medicines, insulin, or both. ? Exercising for at least 30 minutes on 5 or more days each week, or as told by your health care provider. ? Tracking how many servings of carbohydrates you eat at each meal.  You may be given specific guidelines on how much of certain  foods and nutrients you may eat, depending on your stage of kidney disease and whether you have high blood pressure (hypertension). Follow your meal plan as told by your dietitian. What nutrients should be limited? The items listed are not a complete list. Talk with your dietitian about what dietary choices are best for you. Potassium Potassium affects how steadily your heart beats. If too much potassium builds up in your blood, it can cause an irregular heartbeat or even a heart attack. You may need to eat less potassium, depending on your blood potassium levels and the stage of kidney disease. Talk to your dietitian about how much potassium you may have each day. You may need to limit or avoid foods that are high in potassium, such as:  Milk and soy milk.  Fruits, such as bananas, papaya, apricots, nectarines, melon, prunes, raisins, kiwi, and oranges.  Vegetables, such as potatoes, sweet potatoes, yams,  tomatoes, leafy greens, beets, okra, avocado, pumpkin, and winter squash.  White and lima beans. Phosphorus Phosphorus is a mineral found in your bones. A balance between calcium and phosphorous is needed to build and maintain healthy bones. Too much phosphorus pulls calcium from your bones. This can make your bones weak and more likely to break. Too much phosphorus can also make your skin itch. You may need to eat less phosphorus depending on your blood phosphorus levels and the stage of kidney disease. Talk to your dietitian about how much potassium you may have each day. You may need to take medicine to lower your blood phosphorus levels if diet changes do not help. You may need to limit or avoid foods that are high in phosphorus, such as:  Milk and dairy products.  Dried beans and peas.  Tofu, soy milk, and other soy-based meat replacements.  Colas.  Nuts and peanut butter.  Meat, poultry, and fish.  Bran cereals and oatmeals. Protein Protein helps you to make and keep muscle.  It also helps in the repair of your body's cells and tissues. One of the natural breakdown products of protein is a waste product called urea. When your kidneys are not working properly, they cannot remove wastes, such as urea, like they did before you developed chronic kidney disease. Reducing how much protein you eat can help prevent a buildup of urea in your blood. Depending on your stage of kidney disease, you may need to limit foods that are high in protein. Sources of animal protein include:  Meat (all types).  Fish and seafood.  Poultry.  Eggs.  Dairy. Other protein foods include:  Beans and legumes.  Nuts and nut butter.  Soy and tofu. Sodium Sodium, which is found in salt, helps maintain a healthy balance of fluids in your body. Too much sodium can increase your blood pressure and have a negative effect on the function of your heart and lungs. Too much sodium can also cause your body to retain too much fluid, making your kidneys work harder. Most people should have less than 2,300 milligrams (mg) of sodium each day. If you have hypertension, you may need to limit your sodium to 1,500 mg each day. Talk to your dietitian about how much sodium you may have each day. You may need to limit or avoid foods that are high in sodium, such as:  Salt seasonings.  Soy sauce.  Cured and processed meats.  Salted crackers and snack foods.  Fast food.  Canned soups and most canned foods.  Pickled foods.  Vegetable juice.  Boxed mixes or ready-to-eat boxed meals and side dishes.  Bottled dressings, sauces, and marinades. Summary  Chronic kidney disease can lead to a buildup and imbalance of waste and excess substances in the body. Certain foods lead to a buildup of these substances. By adjusting your intake of these foods, you could help prevent more kidney damage and delay or prevent the need for dialysis.  Food adjustments are different for each person with chronic kidney  disease. Work with a dietitian to set up nutrient goals and a meal plan that is right for you.  If you have diabetes and chronic kidney disease, it is important to keep your blood glucose in the target range recommended by your health care provider. This information is not intended to replace advice given to you by your health care provider. Make sure you discuss any questions you have with your health care provider. Document Released: 08/18/2002 Document  Revised: 05/23/2016 Document Reviewed: 05/23/2016 Elsevier Interactive Patient Education  2019 [employer] Inc.  +++++++++++++++++++++++++++++ Recommend Adult Low Dose Aspirin or  coated  Aspirin 81 mg daily  To reduce risk of Colon Cancer 20 %,  Skin Cancer 26 % ,  Melanoma 46%  and  Pancreatic cancer 60% +++++++++++++++++++++++++ Vitamin D goal  is between 70-100.  Please make sure that you are taking your Vitamin D as directed.  It is very important as a natural anti-inflammatory  helping hair, skin, and nails, as well as reducing stroke and heart attack risk.  It helps your bones and helps with mood. It also decreases numerous cancer risks so please take it as directed.  Low Vit D is associated with a 200-300% higher risk for CANCER  and 200-300% higher risk for HEART   ATTACK  &  STROKE.   .....................................Marland Kitchen It is also associated with higher death rate at younger ages,  autoimmune diseases like Rheumatoid arthritis, Lupus, Multiple Sclerosis.    Also many other serious conditions, like depression, Alzheimer's Dementia, infertility, muscle aches, fatigue, fibromyalgia - just to name a few. ++++++++++++++++++++ Recommend the book "The END of DIETING" by Dr Excell Seltzer  & the book "The END of DIABETES " by Dr Excell Seltzer At [email or web address] - get book & Audio CD's    Being diabetic has a  300% increased risk for heart attack, stroke, cancer, and alzheimer- type vascular dementia. It is very important that you work  harder with diet by avoiding all foods that are white. Avoid white rice (brown & wild rice is OK), white potatoes (sweetpotatoes in moderation is OK), White bread or wheat bread or anything made out of white flour like bagels, donuts, rolls, buns, biscuits, cakes, pastries, cookies, pizza crust, and pasta (made from white flour & egg whites) - vegetarian pasta or spinach or wheat pasta is OK. Multigrain breads like Arnold's or Pepperidge Farm, or multigrain sandwich thins or flatbreads.  Diet, exercise and weight loss can reverse and cure diabetes in the early stages.  Diet, exercise and weight loss is very important in the control and prevention of complications of diabetes which affects every system in your body, ie. Brain - dementia/stroke, eyes - glaucoma/blindness, heart - heart attack/heart failure, kidneys - dialysis, stomach - gastric paralysis, intestines - malabsorption, nerves - severe painful neuritis, circulation - gangrene & loss of a leg(s), and finally cancer and Alzheimers.    I recommend avoid fried & greasy foods,  sweets/candy, white rice (brown or wild rice or Quinoa is OK), white potatoes (sweet potatoes are OK) - anything made from white flour - bagels, doughnuts, rolls, buns, biscuits,white and wheat breads, pizza crust and traditional pasta made of white flour & egg white(vegetarian pasta or spinach or wheat pasta is OK).  Multi-grain bread is OK - like multi-grain flat bread or sandwich thins. Avoid alcohol in excess. Exercise is also important.    Eat all the vegetables you want - avoid meat, especially red meat and dairy - especially cheese.  Cheese is the most concentrated form of trans-fats which is the worst thing to clog up our arteries. Veggie cheese is OK which can be found in the fresh produce section at [employer] or [employer] or [employer]  +++++++++++++++++++++ DASH Eating Plan  DASH stands for "Dietary Approaches to Stop Hypertension."   The DASH eating plan is a  healthy eating plan that has been shown to reduce high blood pressure (hypertension). Additional health benefits may include reducing the  risk of type 2 diabetes mellitus, heart disease, and stroke. The DASH eating plan may also help with weight loss. WHAT DO I NEED TO KNOW ABOUT THE DASH EATING PLAN? For the DASH eating plan, you will follow these general guidelines:  Choose foods with a percent daily value for sodium of less than 5% (as listed on the food label).  Use salt-free seasonings or herbs instead of table salt or sea salt.  Check with your health care provider or pharmacist before using salt substitutes.  Eat lower-sodium products, often labeled as "lower sodium" or "no salt added."  Eat fresh foods.  Eat more vegetables, fruits, and low-fat dairy products.  Choose whole grains. Look for the word "whole" as the first word in the ingredient list.  Choose fish   Limit sweets, desserts, sugars, and sugary drinks.  Choose heart-healthy fats.  Eat veggie cheese   Eat more home-cooked food and less restaurant, buffet, and fast food.  Limit fried foods.  Cook foods using methods other than frying.  Limit canned vegetables. If you do use them, rinse them well to decrease the sodium.  When eating at a restaurant, ask that your food be prepared with less salt, or no salt if possible.                      WHAT FOODS CAN I EAT? Read Dr Fara Olden Fuhrman's books on The End of Dieting & The End of Diabetes  Grains Whole grain or whole wheat bread. Brown rice. Whole grain or whole wheat pasta. Quinoa, bulgur, and whole grain cereals. Low-sodium cereals. Corn or whole wheat flour tortillas. Whole grain cornbread. Whole grain crackers. Low-sodium crackers.  Vegetables Fresh or frozen vegetables (raw, steamed, roasted, or grilled). Low-sodium or reduced-sodium tomato and vegetable juices. Low-sodium or reduced-sodium tomato sauce and paste. Low-sodium or reduced-sodium canned  vegetables.   Fruits All fresh, canned (in natural juice), or frozen fruits.  Protein Products  All fish and seafood.  Dried beans, peas, or lentils. Unsalted nuts and seeds. Unsalted canned beans.  Dairy Low-fat dairy products, such as skim or 1% milk, 2% or reduced-fat cheeses, low-fat ricotta or cottage cheese, or plain low-fat yogurt. Low-sodium or reduced-sodium cheeses.  Fats and Oils Tub margarines without trans fats. Light or reduced-fat mayonnaise and salad dressings (reduced sodium). Avocado. Safflower, olive, or canola oils. Natural peanut or almond butter.  Other Unsalted popcorn and pretzels. The items listed above may not be a complete list of recommended foods or beverages. Contact your dietitian for more options.  +++++++++++++++  WHAT FOODS ARE NOT RECOMMENDED? Grains/ White flour or wheat flour White bread. White pasta. White rice. Refined cornbread. Bagels and croissants. Crackers that contain trans fat.  Vegetables  Creamed or fried vegetables. Vegetables in a . Regular canned vegetables. Regular canned tomato sauce and paste. Regular tomato and vegetable juices.  Fruits Dried fruits. Canned fruit in light or heavy syrup. Fruit juice.  Meat and Other Protein Products Meat in general - RED meat & White meat.  Fatty cuts of meat. Ribs, chicken wings, all processed meats as bacon, sausage, bologna, salami, fatback, hot dogs, bratwurst and packaged luncheon meats.  Dairy Whole or 2% milk, cream, half-and-half, and cream cheese. Whole-fat or sweetened yogurt. Full-fat cheeses or blue cheese. Non-dairy creamers and whipped toppings. Processed cheese, cheese spreads, or cheese curds.  Condiments Onion and garlic salt, seasoned salt, table salt, and sea salt. Canned and packaged gravies. Worcestershire sauce. Tartar sauce. Barbecue  sauce. Teriyaki sauce. Soy sauce, including reduced sodium. Steak sauce. Fish sauce. Oyster sauce. Cocktail sauce. Horseradish. Ketchup  and mustard. Meat flavorings and tenderizers. Bouillon cubes. Hot sauce. Tabasco sauce. Marinades. Taco seasonings. Relishes.  Fats and Oils Butter, stick margarine, lard, shortening and bacon fat. Coconut, palm kernel, or palm oils. Regular salad dressings.  Pickles and olives. Salted popcorn and pretzels.  The items listed above may not be a complete list of foods and beverages to avoid.

## 2018-06-29 NOTE — Progress Notes (Signed)
This very nice 85 y.o. WWF presents for 6 month follow up with HTN, HLD, Pre-Diabetes and Vitamin D Deficiency. Patient has recently been evaluated by Dr Lindi Adie for an Anemia of Chronic Disease which he attributes for her CKD3.      Patient is treated for HTN (1986) & BP has been labile at home. Today's BP is at goal - 118/60. Patient has had no complaints of any cardiac type chest pain, palpitations, dyspnea / orthopnea / PND, dizziness, claudication, or dependent edema.     Hyperlipidemia is controlled with diet & meds. Patient denies myalgias or other med SE's. Last Lipids were at goal: Lab Results  Component Value Date   CHOL 158 10/10/2017   HDL 41 (L) 10/10/2017   LDLCALC 93 10/10/2017   TRIG 139 10/10/2017   CHOLHDL 3.9 10/10/2017      Also, the patient has Obesity and history of PreDiabetes (A1c 5.7% / 2014 and 5.8% / 2018) and has had no symptoms of reactive hypoglycemia, diabetic polys, paresthesias or visual blurring.  Last A1c was not at goal: Lab Results  Component Value Date   HGBA1C 6.1 (H) 05/01/2018      Further, the patient also has history of Vitamin D Deficiency("36'" / 2009 & "29" / 2016)  and supplements vitamin D without any suspected side-effects. Last vitamin D was at goal: Lab Results  Component Value Date   VD25OH 87 05/01/2018   Current Outpatient Medications on File Prior to Visit  Medication Sig  . acetaminophen (TYLENOL) 325 MG tablet Take 325 mg by mouth every 6 (six) hours as needed.  Marland Kitchen amLODipine (NORVASC) 10 MG tablet TAKE 1/2 TO 1 TABLET BY MOUTH DAILY FOR BP  . aspirin 81 MG tablet Take 81 mg by mouth daily.    Marland Kitchen atenolol (TENORMIN) 50 MG tablet Take 1 tablet daily for BP  . benazepril (LOTENSIN) 40 MG tablet TAKE 1 TABLET BY MOUTH ONCE DAILY  . Cholecalciferol (VITAMIN D3) 1000 units CAPS Take 1,000 Units by mouth 2 (two) times daily.  . clorazepate (TRANXENE) 7.5 MG tablet TAKE 1/2 TO 1 (ONE-HALF TO ONE) TABLET BY MOUTH 2 TO 3 TIMES DAILY  AS NEEDED FOR ANXIETY  . hydrochlorothiazide (HYDRODIURIL) 25 MG tablet TAKE 1 TABLET BY MOUTH ONCE DAILY FOR BLOOD PRESSURE AND  FLUID  . levothyroxine (SYNTHROID, LEVOTHROID) 150 MCG tablet TAKE 1 TABLET BY MOUTH ONCE DAILY  . meclizine (ANTIVERT) 25 MG tablet 1/2-1 pill up to 3 times daily for motion sickness/dizziness   No current facility-administered medications on file prior to visit.    Allergies  Allergen Reactions  . Sulfa Antibiotics Other (See Comments)  . Asa [Aspirin] Other (See Comments)    Take 81mg  cannot take 325mg  gets nervous  . Codeine Nausea Only  . Prednisone Nausea And Vomiting   PMHx:   Past Medical History:  Diagnosis Date  . Anemia   . CKD (chronic kidney disease) stage 3, GFR 30-59 ml/min (HCC)    pt states she no longer has this  . Colon polyp   . Diverticulosis   . Hyperlipidemia   . Hyperlipidemia   . Hypertension   . Hypothyroidism   . Iron deficiency   . Prediabetes   . Prediabetes   . Small bowel obstruction ([place])    Immunization History  Administered Date(s) Administered  . DT 03/22/2015  . Influenza Split 03/04/2013  . Influenza, High Dose Seasonal PF 03/16/2014, 03/22/2015, 02/15/2016, 04/23/2017, 04/30/2018  . Pneumococcal  Polysaccharide-23 03/16/2014  . Td 10/10/2017   Past Surgical History:  Procedure Laterality Date  . CATARACT EXTRACTION    . EYE SURGERY    . MACULAR TEAR    . Vericose vein  1960   FHx:    Reviewed / unchanged  SHx:    Reviewed / unchanged   Systems Review:  Constitutional: Denies fever, chills, wt changes, headaches, insomnia, fatigue, night sweats, change in appetite. Eyes: Denies redness, blurred vision, diplopia, discharge, itchy, watery eyes.  ENT: Denies discharge, congestion, post nasal drip, epistaxis, sore throat, earache, hearing loss, dental pain, tinnitus, vertigo, sinus pain, snoring.  CV: Denies chest pain, palpitations, irregular heartbeat, syncope, dyspnea, diaphoresis, orthopnea, PND,  claudication or edema. Respiratory: denies cough, dyspnea, DOE, pleurisy, hoarseness, laryngitis, wheezing.  Gastrointestinal: Denies dysphagia, odynophagia, heartburn, reflux, water brash, abdominal pain or cramps, nausea, vomiting, bloating, diarrhea, constipation, hematemesis, melena, hematochezia  or hemorrhoids. Genitourinary: Denies dysuria, frequency, urgency, nocturia, hesitancy, discharge, hematuria or flank pain. Musculoskeletal: Denies arthralgias, myalgias, stiffness, jt. swelling, pain, limping or strain/sprain.  Skin: Denies pruritus, rash, hives, warts, acne, eczema or change in skin lesion(s). Neuro: No weakness, tremor, incoordination, spasms, paresthesia or pain. Psychiatric: Denies confusion, memory loss or sensory loss. Endo: Denies change in weight, skin or hair change.  Heme/Lymph: No excessive bleeding, bruising or enlarged lymph nodes.  Physical Exam  BP 118/60   Pulse 60   Temp (!) 97.3 F (36.3 C)   Ht 5' 2.5" (1.588 m)   Wt 166 lb 3.2 oz (75.4 kg)   SpO2 98%   BMI 29.91 kg/m   Appears  well nourished, well groomed  and in no distress.  Eyes: PERRLA, EOMs, conjunctiva no swelling or erythema. Sinuses: No frontal/maxillary tenderness ENT/Mouth: EAC's clear, TM's nl w/o erythema, bulging. Nares clear w/o erythema, swelling, exudates. Oropharynx clear without erythema or exudates. Oral hygiene is good. Tongue normal, non obstructing. Hearing intact.  Neck: Supple. Thyroid not palpable. Car 2+/2+ without bruits, nodes or JVD. Chest: Respirations nl with BS clear & equal w/o rales, rhonchi, wheezing or stridor.  Cor: Heart sounds normal w/ regular rate and rhythm without sig. murmurs, gallops, clicks or rubs. Peripheral pulses normal and equal  without edema.  Abdomen: Soft & bowel sounds normal. Non-tender w/o guarding, rebound, hernias, masses or organomegaly.  Lymphatics: Unremarkable.  Musculoskeletal: Full ROM all peripheral extremities, joint stability, 5/5  strength and normal gait.  Skin: Warm, dry without exposed rashes, lesions or ecchymosis apparent.  Neuro: Cranial nerves intact, reflexes equal bilaterally. Sensory-motor testing grossly intact. Tendon reflexes grossly intact.  Pysch: Alert & oriented x 3.  Insight and judgement nl & appropriate. No ideations.  Assessment and Plan:  1. Essential hypertension - Continue medication, monitor blood pressure at home.  - Continue DASH diet.  Reminder to go to the ER if any CP,  SOB, nausea, dizziness, severe HA, changes vision/speech.  - CBC with Differential/Platelet - COMPLETE METABOLIC PANEL WITH GFR - Magnesium - TSH  2. Hyperlipidemia, mixed  - Continue diet/meds, exercise,& lifestyle modifications.  - Continue monitor periodic cholesterol/liver & renal functions   - TSH  3. Abnormal glucose  - Continue diet, exercise  - Lifestyle modifications.  - Monitor appropriate labs.  - Hemoglobin A1c  4. Vitamin D deficiency  - Continue supplementation.   - VITAMIN D 25 Hydroxyl  5. Hypothyroidism, unspecified type  - TSH  6. Prediabetes  - Hemoglobin A1c - Insulin, random  7. CKD (chronic kidney disease) stage 3, GFR 30-59  ml/min (HCC)  - COMPLETE METABOLIC PANEL WITH GFR  8. Medication management  - CBC with Differential/Platelet - COMPLETE METABOLIC PANEL WITH GFR - Magnesium - TSH - Hemoglobin A1c - Insulin, random - VITAMIN D 25 Hydroxyl       Discussed  regular exercise, BP monitoring, weight control to achieve/maintain BMI less than 25 and discussed med and SE's. Recommended labs to assess and monitor clinical status with further disposition pending results of labs. Over 30 minutes of exam, counseling, chart review was performed.

## 2018-06-30 ENCOUNTER — Encounter: Payer: Self-pay | Admitting: Internal Medicine

## 2018-06-30 ENCOUNTER — Other Ambulatory Visit: Payer: Self-pay | Admitting: Internal Medicine

## 2018-06-30 ENCOUNTER — Ambulatory Visit (INDEPENDENT_AMBULATORY_CARE_PROVIDER_SITE_OTHER): Payer: Medicare HMO | Admitting: Internal Medicine

## 2018-06-30 VITALS — BP 118/60 | HR 60 | Temp 97.3°F | Ht 62.5 in | Wt 166.2 lb

## 2018-06-30 DIAGNOSIS — N183 Chronic kidney disease, stage 3 unspecified: Secondary | ICD-10-CM

## 2018-06-30 DIAGNOSIS — E039 Hypothyroidism, unspecified: Secondary | ICD-10-CM

## 2018-06-30 DIAGNOSIS — Z79899 Other long term (current) drug therapy: Secondary | ICD-10-CM | POA: Diagnosis not present

## 2018-06-30 DIAGNOSIS — R7309 Other abnormal glucose: Secondary | ICD-10-CM

## 2018-06-30 DIAGNOSIS — E559 Vitamin D deficiency, unspecified: Secondary | ICD-10-CM | POA: Diagnosis not present

## 2018-06-30 DIAGNOSIS — I10 Essential (primary) hypertension: Secondary | ICD-10-CM | POA: Diagnosis not present

## 2018-06-30 DIAGNOSIS — E782 Mixed hyperlipidemia: Secondary | ICD-10-CM | POA: Diagnosis not present

## 2018-06-30 DIAGNOSIS — R7303 Prediabetes: Secondary | ICD-10-CM | POA: Diagnosis not present

## 2018-07-01 LAB — CBC WITH DIFFERENTIAL/PLATELET
Absolute Monocytes: 635 cells/uL (ref 200–950)
Basophils Absolute: 48 {cells}/uL (ref 0–200)
Basophils Relative: 0.7 %
Eosinophils Absolute: 131 cells/uL (ref 15–500)
Eosinophils Relative: 1.9 %
HCT: 31.3 % — ABNORMAL LOW (ref 35.0–45.0)
Hemoglobin: 10.2 g/dL — ABNORMAL LOW (ref 11.7–15.5)
Lymphs Abs: 1553 cells/uL (ref 850–3900)
MCH: 28.2 pg (ref 27.0–33.0)
MCHC: 32.6 g/dL (ref 32.0–36.0)
MCV: 86.5 fL (ref 80.0–100.0)
MPV: 11.6 fL (ref 7.5–12.5)
Monocytes Relative: 9.2 %
Neutro Abs: 4533 cells/uL (ref 1500–7800)
Neutrophils Relative %: 65.7 %
Platelets: 248 10*3/uL (ref 140–400)
RBC: 3.62 10*6/uL — ABNORMAL LOW (ref 3.80–5.10)
RDW: 12.6 % (ref 11.0–15.0)
Total Lymphocyte: 22.5 %
WBC: 6.9 10*3/uL (ref 3.8–10.8)

## 2018-07-01 LAB — MAGNESIUM: Magnesium: 1.7 mg/dL (ref 1.5–2.5)

## 2018-07-01 LAB — COMPLETE METABOLIC PANEL WITH GFR
AG Ratio: 1.4 (calc) (ref 1.0–2.5)
AST: 19 U/L (ref 10–35)
Albumin: 3.9 g/dL (ref 3.6–5.1)
Alkaline phosphatase (APISO): 63 U/L (ref 33–130)
BUN/Creatinine Ratio: 17 (calc) (ref 6–22)
BUN: 21 mg/dL (ref 7–25)
CO2: 27 mmol/L (ref 20–32)
Calcium: 8.9 mg/dL (ref 8.6–10.4)
Chloride: 105 mmol/L (ref 98–110)
Creat: 1.24 mg/dL — ABNORMAL HIGH (ref 0.60–0.88)
GFR, Est African American: 46 mL/min/{1.73_m2} — ABNORMAL LOW (ref >=60)
GFR, Est Non African American: 40 mL/min/{1.73_m2} — ABNORMAL LOW (ref >=60)
Globulin: 2.7 g/dL (calc) (ref 1.9–3.7)
Glucose, Bld: 120 mg/dL — ABNORMAL HIGH (ref 65–99)
Potassium: 4.7 mmol/L (ref 3.5–5.3)
Sodium: 141 mmol/L (ref 135–146)
Total Bilirubin: 0.4 mg/dL (ref 0.2–1.2)
Total Protein: 6.6 g/dL (ref 6.1–8.1)

## 2018-07-01 LAB — HEMOGLOBIN A1C
Hgb A1c MFr Bld: 5.8 % of total Hgb — ABNORMAL HIGH (ref <5.7)
Mean Plasma Glucose: 120 (calc)
eAG (mmol/L): 6.6 (calc)

## 2018-07-01 LAB — VITAMIN D 25 HYDROXY (VIT D DEFICIENCY, FRACTURES): Vit D, 25-Hydroxy: 75 ng/mL (ref 30–100)

## 2018-07-01 LAB — COMPLETE METABOLIC PANEL WITHOUT GFR: ALT: 11 U/L (ref 6–29)

## 2018-07-01 LAB — TSH: TSH: 2.48 mIU/L (ref 0.40–4.50)

## 2018-07-01 LAB — INSULIN, RANDOM: Insulin: 21.1 u[IU]/mL — ABNORMAL HIGH (ref 2.0–19.6)

## 2018-07-14 ENCOUNTER — Encounter: Payer: Self-pay | Admitting: Physician Assistant

## 2018-07-14 ENCOUNTER — Ambulatory Visit (INDEPENDENT_AMBULATORY_CARE_PROVIDER_SITE_OTHER): Payer: Medicare HMO | Admitting: Physician Assistant

## 2018-07-14 VITALS — BP 118/76 | HR 60 | Temp 97.9°F | Ht 62.5 in | Wt 171.2 lb

## 2018-07-14 DIAGNOSIS — R202 Paresthesia of skin: Secondary | ICD-10-CM | POA: Diagnosis not present

## 2018-07-14 MED ORDER — NEOMYCIN-POLYMYXIN-HC 1 % OT SOLN: 3.0000 [drp] | Freq: Four times a day (QID) | OTIC | 0 refills | Status: DC

## 2018-07-14 MED ORDER — NEOMYCIN-POLYMYXIN-HC 3.5-10000-1 OT SOLN: 4.0000 [drp] | Freq: Three times a day (TID) | OTIC | 0 refills | Status: DC

## 2018-07-14 MED ORDER — NEOMYCIN-COLIST-HC-THONZONIUM 3.3-3-10-0.5 MG/ML OT SUSP: OTIC | 0 refills | Status: DC

## 2018-07-14 NOTE — Patient Instructions (Addendum)
GET THE SUBLINGUAL B12- I DON'T CARE ABOUT THE DOSE AS MUCH AS I WANT IT TO BE SUBLINGUAL OR MELT IN YOUR MOUTH If your symptoms are not better in a month we will send you to neurology  Vitamin B12 Deficiency Vitamin B12 deficiency occurs when the body does not have enough vitamin B12. Vitamin B12 is an important vitamin. The body needs vitamin B12:  To make red blood cells.  To make DNA. This is the genetic material inside cells.  To help the nerves work properly so they can carry messages from the brain to the body. Vitamin B12 deficiency can cause various health problems, such as a low red blood cell count (anemia) or nerve damage. What are the causes? This condition may be caused by:  Not eating enough foods that contain vitamin B12.  Not having enough stomach acid and digestive fluids to properly absorb vitamin B12 from the food that you eat.  Certain digestive system diseases that make it hard to absorb vitamin B12. These diseases include Crohn disease, chronic pancreatitis, and cystic fibrosis.  Pernicious anemia. This is a condition in which the body does not make enough of a protein (intrinsic factor), resulting in too few red blood cells.  Having a surgery in which part of the stomach or small intestine is removed.  Taking certain medicines that make it hard for the body to absorb vitamin B12. These medicines include: ? Heartburn medicine (antacids and proton pump inhibitors). ? An antibiotic medicine called neomycin. ? Some medicines that are used to treat diabetes, tuberculosis, gout, or high cholesterol. What increases the risk? The following factors may make you more likely to develop a B12 deficiency:  Being older than age 61.  Eating a vegetarian or vegan diet, especially while you are pregnant.  Eating a poor diet while you are pregnant.  Taking certain drugs.  Having alcoholism. What are the signs or symptoms? In some cases, there are no symptoms of this  condition. If the condition leads to anemia or nerve damage, various symptoms can occur, such as:  Weakness.  Fatigue.  Loss of appetite.  Weight loss.  Numbness or tingling in your hands and feet.  Redness and burning of the tongue.  Confusion or memory problems.  Depression.  Sensory problems, such as color blindness, ringing in the ears, or loss of taste.  Diarrhea or constipation.  Trouble walking. If anemia is severe, symptoms can include:  Shortness of breath.  Dizziness.  Rapid heart rate (tachycardia).  How is this diagnosed? This condition may be diagnosed with a blood test to measure the level of vitamin B12 in your blood. You may have other tests to help find the cause of your vitamin B12 deficiency. These tests may include:  A complete blood count (CBC). This is a group of tests that measure certain characteristics of blood cells.  A blood test to measure intrinsic factor.  An endoscopy. In this procedure, a thin tube with a camera on the end is used to look into your stomach or intestines. How is this treated? Treatment for this condition depends on the cause. Common treatment options include:  Changing your eating and drinking habits, such as: ? Eating more foods that contain vitamin B12. ? Drinking less alcohol or no alcohol.  Taking vitamin B12 supplements. Your health care provider will tell you which dosage is best for you.  Getting vitamin B12 injections. Follow these instructions at home:  Take supplements only as told by your health  care provider. Follow the directions carefully.  Get any injections that are prescribed by your health care provider.  Do not miss your appointments.  Eat lots of healthy foods that contain vitamin B12. Ask your health care provider if you should work with a dietitian. Foods that contain vitamin B12 include: ? Meat. ? Meat from birds (poultry). ? Fish. ? Eggs. ? Cereal and dairy products that are fortified.  This means that vitamin B12 has been added to the food. Check the label on the package to see if the food is fortified.  Do not abuse alcohol.  Keep all follow-up visits as told by your health care provider. This is important. Contact a health care provider if:  Your symptoms come back. Get help right away if:  You develop shortness of breath.  You have chest pain.  You become dizzy or you lose consciousness. This information is not intended to replace advice given to you by your health care provider. Make sure you discuss any questions you have with your health care provider. Document Released: 08/20/2011 Document Revised: 11/09/2015 Document Reviewed: 10/13/2014 Elsevier Interactive Patient Education  2019 [employer].

## 2018-07-14 NOTE — Progress Notes (Signed)
Subjective:    Patient ID: Mikayla Harrison, female    DOB: 11-Apr-1932, 85 y.o.   MRN: [id number]  HPI 85 y.o. WF has Hypertension; Hypothyroidism; Prediabetes; Diverticulosis; Hyperlipidemia; Medication management; Vitamin D deficiency; CKD Stage III (GFR 56 ml/min); Unifocal PVCs; Overweight (BMI 25.0-29.9); Raynaud phenomenon; History of TIA (transient ischemic attack); Abnormal glucose; FHx: heart disease; CREST syndrome (HCC); and Normocytic anemia on their problem list. presents with bilateral leg pain.   States last 2-3 months she has had numbness/pain bilateral legs to her mid shin. Worse in the evenings. She had negative SPEP, normal folate, normal iron.  Lab Results  Component Value Date   VITAMINB12 341 06/10/2018   Lab Results  Component Value Date   HGBA1C 5.8 (H) 06/30/2018     Blood pressure 118/76, pulse 60, temperature 97.9 F (36.6 C), height 5' 2.5" (1.588 m), weight 171 lb 3.2 oz (77.7 kg), SpO2 96 %.  Medications Current Outpatient Medications on File Prior to Visit  Medication Sig  . acetaminophen (TYLENOL) 325 MG tablet Take 325 mg by mouth every 6 (six) hours as needed.  Marland Kitchen amLODipine (NORVASC) 10 MG tablet TAKE 1/2 TO 1 TABLET BY MOUTH DAILY FOR BP  . aspirin 81 MG tablet Take 81 mg by mouth daily.    Marland Kitchen atenolol (TENORMIN) 50 MG tablet Take 1 tablet daily for BP  . benazepril (LOTENSIN) 40 MG tablet TAKE 1 TABLET BY MOUTH ONCE DAILY  . Cholecalciferol (VITAMIN D3) 1000 units CAPS Take 1,000 Units by mouth 2 (two) times daily.  . clorazepate (TRANXENE) 7.5 MG tablet TAKE 1/2 TO 1 (ONE-HALF TO ONE) TABLET BY MOUTH 2 TO 3 TIMES DAILY AS NEEDED FOR ANXIETY  . levothyroxine (SYNTHROID, LEVOTHROID) 150 MCG tablet TAKE 1 TABLET BY MOUTH ONCE DAILY  . meclizine (ANTIVERT) 25 MG tablet 1/2-1 pill up to 3 times daily for motion sickness/dizziness   No current facility-administered medications on file prior to visit.     Problem list She has Hypertension;  Hypothyroidism; Prediabetes; Diverticulosis; Hyperlipidemia; Medication management; Vitamin D deficiency; CKD Stage III (GFR 56 ml/min); Unifocal PVCs; Overweight (BMI 25.0-29.9); Raynaud phenomenon; History of TIA (transient ischemic attack); Abnormal glucose; FHx: heart disease; CREST syndrome ([place]); and Normocytic anemia on their problem list.  Review of Systems  Constitutional: Negative.  Negative for chills, fever and unexpected weight change.  HENT: Negative.   Eyes: Negative.   Respiratory: Negative.   Cardiovascular: Negative.   Gastrointestinal: Negative.   Genitourinary: Negative.   Musculoskeletal: Negative.   Skin: Negative.  Negative for rash.  Neurological: Positive for numbness. Negative for dizziness, tremors, seizures, syncope, facial asymmetry, speech difficulty, weakness, light-headedness and headaches.       Objective:   Physical Exam Constitutional:      Appearance: Normal appearance.  Neck:     Musculoskeletal: Normal range of motion and neck supple.  Cardiovascular:     Rate and Rhythm: Normal rate and regular rhythm.     Pulses: Normal pulses.  Pulmonary:     Effort: Pulmonary effort is normal.     Breath sounds: Normal breath sounds.  Abdominal:     Palpations: Abdomen is soft.     Tenderness: There is no abdominal tenderness.  Skin:    General: Skin is warm and dry.     Capillary Refill: Capillary refill takes less than 2 seconds.     Findings: No rash.  Neurological:     General: No focal deficit present.     Mental  Status: She is alert and oriented to person, place, and time.     Cranial Nerves: No cranial nerve deficit.     Sensory: No sensory deficit.     Motor: No weakness.     Coordination: Coordination normal.     Deep Tendon Reflexes: Reflexes normal.     Comments: Normal monofilament test  Psychiatric:        Mood and Affect: Mood normal.        Behavior: Behavior normal.         Assessment & Plan:  Mikayla Harrison was seen today for acute  visit, otalgia and other.  Diagnoses and all orders for this visit:  Paresthesia of both lower extremities B12 low normal, get on sublingual b12 Has had large work up Follow up rheumotology May need referral to neuro Declines meds at this time No focal weakness or signs of stroke ER precautions discussed with the patient.  -     CK -     RPR -     CBC with Differential/Platelet -     COMPLETE METABOLIC PANEL WITH GFR -     TSH

## 2018-07-15 LAB — COMPLETE METABOLIC PANEL WITH GFR
AG Ratio: 1.5 (calc) (ref 1.0–2.5)
ALT: 11 U/L (ref 6–29)
AST: 22 U/L (ref 10–35)
Albumin: 3.8 g/dL (ref 3.6–5.1)
Alkaline phosphatase (APISO): 54 U/L (ref 37–153)
BUN/Creatinine Ratio: 19 (calc) (ref 6–22)
BUN: 23 mg/dL (ref 7–25)
CO2: 26 mmol/L (ref 20–32)
Chloride: 102 mmol/L (ref 98–110)
Creat: 1.22 mg/dL — ABNORMAL HIGH (ref 0.60–0.88)
GFR, Est African American: 47 mL/min/{1.73_m2} — ABNORMAL LOW (ref >=60)
GFR, Est Non African American: 41 mL/min/{1.73_m2} — ABNORMAL LOW (ref >=60)
Globulin: 2.6 g/dL (calc) (ref 1.9–3.7)
Glucose, Bld: 89 mg/dL (ref 65–99)
Potassium: 4.9 mmol/L (ref 3.5–5.3)
Sodium: 135 mmol/L (ref 135–146)
Total Bilirubin: 0.4 mg/dL (ref 0.2–1.2)
Total Protein: 6.4 g/dL (ref 6.1–8.1)

## 2018-07-15 LAB — CBC WITH DIFFERENTIAL/PLATELET
Absolute Monocytes: 696 cells/uL (ref 200–950)
Basophils Absolute: 37 cells/uL (ref 0–200)
Basophils Relative: 0.5 %
Eosinophils Absolute: 192 cells/uL (ref 15–500)
Eosinophils Relative: 2.6 %
HCT: 30 % — ABNORMAL LOW (ref 35.0–45.0)
Hemoglobin: 10 g/dL — ABNORMAL LOW (ref 11.7–15.5)
Lymphs Abs: 1680 cells/uL (ref 850–3900)
MCH: 29.2 pg (ref 27.0–33.0)
MCHC: 33.3 g/dL (ref 32.0–36.0)
MCV: 87.7 fL (ref 80.0–100.0)
MPV: 11.3 fL (ref 7.5–12.5)
Monocytes Relative: 9.4 %
Neutro Abs: 4795 cells/uL (ref 1500–7800)
Neutrophils Relative %: 64.8 %
Platelets: 228 10*3/uL (ref 140–400)
RBC: 3.42 10*6/uL — ABNORMAL LOW (ref 3.80–5.10)
RDW: 12.8 % (ref 11.0–15.0)
Total Lymphocyte: 22.7 %
WBC: 7.4 10*3/uL (ref 3.8–10.8)

## 2018-07-15 LAB — RPR: RPR Ser Ql: NONREACTIVE

## 2018-07-15 LAB — COMPLETE METABOLIC PANEL WITHOUT GFR: Calcium: 8.8 mg/dL (ref 8.6–10.4)

## 2018-07-15 LAB — CK: Total CK: 133 U/L (ref 29–143)

## 2018-07-15 LAB — TSH: TSH: 3.06 m[IU]/L (ref 0.40–4.50)

## 2018-07-31 ENCOUNTER — Other Ambulatory Visit: Payer: Self-pay | Admitting: Internal Medicine

## 2018-07-31 ENCOUNTER — Other Ambulatory Visit: Payer: Self-pay | Admitting: Physician Assistant

## 2018-07-31 DIAGNOSIS — J209 Acute bronchitis, unspecified: Secondary | ICD-10-CM

## 2018-07-31 MED ORDER — AMOXICILLIN 250 MG PO CAPS: ORAL_CAPSULE | ORAL | 0 refills | Status: AC

## 2018-08-01 ENCOUNTER — Ambulatory Visit (INDEPENDENT_AMBULATORY_CARE_PROVIDER_SITE_OTHER): Payer: Medicare HMO | Admitting: Internal Medicine

## 2018-08-01 VITALS — BP 120/66 | HR 56 | Temp 97.5°F | Resp 18 | Ht 62.5 in | Wt 167.2 lb

## 2018-08-01 DIAGNOSIS — J209 Acute bronchitis, unspecified: Secondary | ICD-10-CM

## 2018-08-01 MED ORDER — DEXAMETHASONE 0.5 MG PO TABS: ORAL_TABLET | ORAL | 0 refills | Status: DC

## 2018-08-01 MED ORDER — AZITHROMYCIN 250 MG PO TABS: ORAL_TABLET | ORAL | 1 refills | Status: DC

## 2018-08-01 NOTE — Progress Notes (Signed)
Subjective:    Patient ID: Mikayla Harrison, female    DOB: [birth year], 85 y.o.   MRN: [id number]  HPI    This very nice 85 yo WWF with HTN, HLD, Pre-Diabetes, Chronic/acute anxiety and Vitamin D Deficiency presents now with c/o head & chest congestion, ? fever, (+) chills, no rigors, and yellowish sputum pd'n. Denies dyspnea or dash.  Outpatient Medications Prior to Visit  Medication Sig Dispense Refill  . acetaminophen (TYLENOL) 325 MG tablet Take 325 mg by mouth every 6 (six) hours as needed.    Marland Kitchen amLODipine (NORVASC) 10 MG tablet TAKE 1/2 TO 1 TABLET BY MOUTH DAILY FOR BP 90 tablet 3  . amoxicillin (AMOXIL) 250 MG capsule Take 1 capsule 3 x day with food for infection 21 capsule 0  . aspirin 81 MG tablet Take 81 mg by mouth daily.      Marland Kitchen atenolol (TENORMIN) 50 MG tablet Take 1 tablet daily for BP 90 tablet 3  . benazepril (LOTENSIN) 40 MG tablet TAKE 1 TABLET BY MOUTH ONCE DAILY 90 tablet 1  . Cholecalciferol (VITAMIN D3) 1000 units CAPS Take 1,000 Units by mouth 2 (two) times daily.    . clorazepate (TRANXENE) 7.5 MG tablet TAKE 1/2 TO 1 (ONE-HALF TO ONE) TABLET BY MOUTH 2 TO 3 TIMES DAILY AS NEEDED FOR ANXIETY 90 tablet 0  . levothyroxine (SYNTHROID, LEVOTHROID) 150 MCG tablet TAKE 1 TABLET BY MOUTH ONCE DAILY 90 tablet 1  . meclizine (ANTIVERT) 25 MG tablet 1/2-1 pill up to 3 times daily for motion sickness/dizziness 30 tablet 0  . neomycin-colistin-hydrocortisone-thonzonium (CORTISPORIN-TC) 3.08-11-08-0.5 MG/ML OTIC suspension 4-5 drops a day in the affected ear with drainage or pain 10 mL 0  . NEOMYCIN-POLYMYXIN-HYDROCORTISONE (CORTISPORIN) 1 % SOLN OTIC solution Place 3 drops into both ears 4 (four) times daily. 10 mL 0   No facility-administered medications prior to visit.    Allergies  Allergen Reactions  . Sulfa Antibiotics Other (See Comments)  . Asa [Aspirin] Other (See Comments)    Take 81mg  cannot take 325mg  gets nervous  . Codeine Nausea Only  . Prednisone Nausea And Vomiting    Past Medical History:  Diagnosis Date  . CKD (chronic kidney disease) stage 3, GFR 30-59 ml/min (HCC)   . Colon polyp   . Diverticulosis   . Hypertension   . Iron deficiency   . Prediabetes   . Prediabetes   . Small bowel obstruction (HCC)    Review of Systems     10 point systems review negative except as above.    Objective:   Physical Exam BP 120/66   Pulse (!) 56   Temp (!) 97.5 F (36.4 C)   Resp 18   Ht 5' 2.5" (1.588 m)   Wt 167 lb 3.2 oz (75.8 kg)   BMI 30.09 kg/m   In No Distress. No stridor. Speech clear. Cough dry.   HEENT - Eac's patent. TM's Nl. EOM's full. PERRLA.  NasoOroPharynx clear. Fronto-maxillary sinuses nontender Neck - supple. Nl Thyroid. Chest - Few scattered rales, no rhonchi or wheezes. Cor - Nl HS. RRR w/o sig m. No edema. MS- FROM w/o deformities. Muscle power, tone and bulk Nl. Gait Nl. Neuro - No obvious Cr N abnormalities. Sensory, motor and Cerebellar functions appear Nl w/o focal abnormalities. Skin - exposed clear w/o rash cyanosis or icterus.    Assessment & Plan:   1. Acute bronchitis, unspecified organism  - azithromycin 250 MG; Take 2 tablets on  Day 1,  Then 1 tablet  once daily on Days 2 - 5.  Disp: 6 each; Refill: 1 - dexamethasone  0.5 MG tablet; Take 1 tab 3 x day - 3 days, then 2 x day - 3 days, then 1 tab daily  Dispense: 20 tablet; Refill: 0  - discussed meds & SE's. Discussed return / ER precautions

## 2018-08-02 ENCOUNTER — Encounter: Payer: Self-pay | Admitting: Internal Medicine

## 2018-08-02 NOTE — Patient Instructions (Signed)
Acute Bronchitis, Adult Acute bronchitis is when air tubes (bronchi) in the lungs suddenly get swollen. The condition can make it hard to breathe. It can also cause these symptoms:  A cough.  Coughing up clear, yellow, or green mucus.  Wheezing.  Chest congestion.  Shortness of breath.  A fever.  Body aches.  Chills.  A sore throat. Follow these instructions at home:  Medicines  Take over-the-counter and prescription medicines only as told by your doctor.  If you were prescribed an antibiotic medicine, take it as told by your doctor. Do not stop taking the antibiotic even if you start to feel better. General instructions  Rest.  Drink enough fluids to keep your pee (urine) pale yellow.  Avoid smoking and secondhand smoke. If you smoke and you need help quitting, ask your doctor. Quitting will help your lungs heal faster.  Use an inhaler, cool mist vaporizer, or humidifier as told by your doctor.  Keep all follow-up visits as told by your doctor. This is important. How is this prevented? To lower your risk of getting this condition again:  Wash your hands often with soap and water. If you cannot use soap and water, use hand sanitizer.  Avoid contact with people who have cold symptoms.  Try not to touch your hands to your mouth, nose, or eyes.  Make sure to get the flu shot every year. Contact a doctor if:  Your symptoms do not get better in 2 weeks. Get help right away if:  You cough up blood.  You have chest pain.  You have very bad shortness of breath.  You become dehydrated.  You faint (pass out) or keep feeling like you are going to pass out.  You keep throwing up (vomiting).  You have a very bad headache.  Your fever or chills gets worse.  

## 2018-08-04 ENCOUNTER — Other Ambulatory Visit: Payer: Self-pay | Admitting: Internal Medicine

## 2018-08-04 DIAGNOSIS — I10 Essential (primary) hypertension: Secondary | ICD-10-CM

## 2018-08-04 DIAGNOSIS — E782 Mixed hyperlipidemia: Secondary | ICD-10-CM

## 2018-08-04 MED ORDER — OLMESARTAN MEDOXOMIL 40 MG PO TABS: ORAL_TABLET | ORAL | 3 refills | Status: DC

## 2018-08-12 ENCOUNTER — Other Ambulatory Visit: Payer: Self-pay | Admitting: Adult Health

## 2018-08-23 ENCOUNTER — Other Ambulatory Visit: Payer: Self-pay | Admitting: Internal Medicine

## 2018-08-23 MED ORDER — HYOSCYAMINE SULFATE 0.125 MG SL SUBL: SUBLINGUAL_TABLET | SUBLINGUAL | 0 refills | Status: DC

## 2018-09-20 ENCOUNTER — Other Ambulatory Visit: Payer: Medicare HMO | Admitting: Internal Medicine

## 2018-09-20 DIAGNOSIS — W57XXXA Bitten or stung by nonvenomous insect and other nonvenomous arthropods, initial encounter: Secondary | ICD-10-CM

## 2018-09-20 DIAGNOSIS — S90861A Insect bite (nonvenomous), right foot, initial encounter: Secondary | ICD-10-CM | POA: Diagnosis not present

## 2018-09-20 MED ORDER — DEXAMETHASONE 0.5 MG PO TABS: ORAL_TABLET | ORAL | 0 refills | Status: DC

## 2018-09-20 MED ORDER — CEPHALEXIN 250 MG PO CAPS: ORAL_CAPSULE | ORAL | 0 refills | Status: DC

## 2018-09-20 MED ORDER — TRIAMCINOLONE ACETONIDE 0.5 % EX OINT: 1.0000 "application " | TOPICAL_OINTMENT | Freq: Two times a day (BID) | CUTANEOUS | 0 refills | Status: DC

## 2018-09-20 NOTE — Progress Notes (Signed)
Virtual Visit via Telephone Note    I connected with Mikayla Harrison on 09/20/18 at  by telephone and verified that I am speaking with the correct person using two identifiers.     I discussed the limitations, risks, security and privacy concerns of performing an evaluation and management service by telephone and the availability of in person appointments. I also discussed with the patient that there may be a patient responsible charge related to this service. The patient expressed understanding and agreed to proceed.  History of Present Illness:     This very nice 85 yo WWF with HTN, HLD, Pre-Diabetes, Chronic/acute anxiety and Vitamin D Deficiency presents now with c/o a possible spider or insect bite to the top of her left foot working in her yard 3 days ago. Later that day she developed itching & redness and tried 'cortisone' creams. 2 days ago developed tiny blisters and "morer swelling" , but is able to wear her shoes.   Medication Sig  . acetaminophen  325 MG Take every 6 hours as needed.  Marland Kitchen amLODipine  10 MG TAKE 1/2 - 1 TABLET DAILY FOR BP  . aspirin 81 MG  Take 81 mg by mouth daily.    Marland Kitchen atenolol  50 MG  Take 1 tablet daily for BP  . VITAMIN D 1000 units  Take  2 times daily.  . clorazepate  7.5 MG  TAKE 1/2-1 TAB  2 - 3 TIMES DAILY  . Hyoscyamine SL 0.125 MG  Take 1 to 2 tablets 3 to 4 x /day if needed  . levothyroxine  150 MCG  TAKE 1 TABLET  ONCE DAILY  . Meclizine 25 MG  TAKE 1/2-1 TABLET 3 x / DAILY   . olmesartan 40 MG  Take 1 tablet daily for BP    Allergies  Allergen Reactions  . Sulfa Antibiotics Other (See Comments)  . Asa [Aspirin] Other (See Comments)    Take 81mg  cannot take 325mg  gets nervous  . Codeine Nausea Only  . Prednisone Nausea And Vomiting   Past Medical History:  Diagnosis Date  . CKD (chronic kidney disease) stage 3, GFR 30-59 ml/min (HCC)   . Colon polyp   . Diverticulosis   . Hypertension   . Iron deficiency   . Prediabetes   . Prediabetes   .  Small bowel obstruction (HCC)    Observations/Objective: General : Well sounding patient in no apparent distress HEENT: no hoarseness, no cough for duration of visit Lungs: speaks in complete sentences, no audible wheezing, no apparent distress Neurological: alert, oriented x 3 Psychiatric: pleasant, judgement appropriate   Skin: Patient describes redness &mild swelling of the dorsum Right foot wit a bumpy apperance , but no open , raw or ulcerated areas. She does not see red streaking.  Assessment and Plan:  1. Insect bite of right foot, initial encounter  - cephALEXin  250 MG ; Take 1 cap 4 x /day with meals & bedtime with food for skin infection  Disp: 28 caps  - triamcinolone oint 0.5 %; Apply 1 application topically 2 times daily.  Disp: 30 g  - dexamethasone  0.5 MG tab; Take 1 tab 3 x day - 3 days, then 2 x day - 3 days, then 1 tab daily  Disp: 20 tab  Follow Up Instructions:    Discussed local skin care. The patient was advised to call back or seek an in-person evaluation if the symptoms worsen or if the condition fails to improve  as anticipated.      I discussed the assessment and treatment plan with the patient. The patient was provided an opportunity to ask questions and all were answered. The patient agreed with the plan and demonstrated an understanding of the instructions.      I provided 76minutes of non-face-to-face time during this encounter  & over 26 minutes of  counseling, chart review & critical decision making.  Kirtland Bouchard, MD

## 2018-10-04 ENCOUNTER — Other Ambulatory Visit: Payer: Self-pay | Admitting: Internal Medicine

## 2018-10-04 DIAGNOSIS — S90861A Insect bite (nonvenomous), right foot, initial encounter: Secondary | ICD-10-CM

## 2018-10-04 DIAGNOSIS — W57XXXA Bitten or stung by nonvenomous insect and other nonvenomous arthropods, initial encounter: Principal | ICD-10-CM

## 2018-10-06 ENCOUNTER — Other Ambulatory Visit: Payer: Self-pay

## 2018-10-06 ENCOUNTER — Other Ambulatory Visit: Payer: Medicare HMO

## 2018-10-06 DIAGNOSIS — E559 Vitamin D deficiency, unspecified: Secondary | ICD-10-CM

## 2018-10-06 DIAGNOSIS — I10 Essential (primary) hypertension: Secondary | ICD-10-CM | POA: Diagnosis not present

## 2018-10-06 DIAGNOSIS — N183 Chronic kidney disease, stage 3 unspecified: Secondary | ICD-10-CM

## 2018-10-06 DIAGNOSIS — E782 Mixed hyperlipidemia: Secondary | ICD-10-CM

## 2018-10-06 DIAGNOSIS — R7309 Other abnormal glucose: Secondary | ICD-10-CM | POA: Diagnosis not present

## 2018-10-06 DIAGNOSIS — E039 Hypothyroidism, unspecified: Secondary | ICD-10-CM

## 2018-10-06 DIAGNOSIS — Z79899 Other long term (current) drug therapy: Secondary | ICD-10-CM | POA: Diagnosis not present

## 2018-10-06 NOTE — Progress Notes (Addendum)
MEDICARE ANNUAL WELLNESS VISIT AND FOLLOW UP  THIS ENCOUNTER IS A VIRTUAL/TELEVIDEO VISIT DUE TO COVID-19 - PATIENT WAS NOT SEEN IN THE OFFICE.  PATIENT HAS CONSENTED TO VIRTUAL VISIT / TELEVIDEO VISIT  This provider placed a call to Mikayla Harrison, her appointment was changed to a virtual office visit to reduce the risk of exposure to the COVID-19 virus and to help Mikayla Harrison remain healthy and safe. The virtual visit will also provide continuity of care. She verbalizes understanding.     Assessment:     Encounter for Medicare annual wellness exam   Comprehensive care plan:  Patient/caregiver was given comprehensive care plan We will continue to monitor these goals every 3 months with an office visit and every month by a telephone call  Patient can contact the office any time with the phone number and get to a provider via the answering service or they can use Mychart.   Verbal permission was received from the patient to review comprehensive care management, they understand they have the right to stop CCM services at any time.   The patient is self managing medications at home.  Medications were reviewed with the patient today as well as adherence and potential interactions.   The patient does not need home health services at this time.    Essential hypertension - continue medications, DASH diet, exercise and monitor at home. Call if greater than 130/80.  -check BP in the morning and at 9 oclock - take benazepril and bASA in the morning, take norvasc 1/2 at 7 without checking BP before that - if your BP is over 140/80 at 10 pm, you can take 1/2 of the atenolol  Abnormal glucose Discussed disease and risks Discussed diet/exercise, weight management  A1C  CKD Stage III (GFR 56 ml/min) Increase fluids, avoid NSAIDS, monitor sugars, will monitor - CMP/GFR  Hyperlipidemia At goal without medications at this time;  Continue low cholesterol diet and exercise.  Defer lipid panel  as would not aggressively treat secondary to age  Medication management - Magnesium   Vitamin D deficiency Continue supplement  Hypothyroidism, unspecified hypothyroidism type -check TSH level, continue medications the same, reminded to take on an empty stomach 30-63mins before food.   Unifocal PVCs monitor, check labs   Diverticulosis of intestine without bleeding, unspecified intestinal tract location Controlled, diet   Raynaud phenomenon Declines meds at this time  Overweight Long discussion about weight loss, diet, and exercise Recommended diet heavy in fruits and veggies and low in animal meats, cheeses, and dairy products, appropriate calorie intake Discussed appropriate weight for height Follow up at next visit  History of TIA Control blood pressure, cholesterol, glucose, increase exercise.  Continue ASA  Estrogen deficiency -     DG Bone Density; Future   Over 30 minutes of exam, counseling, chart review, and critical decision making was performed Future Appointments  Date Time Provider [place]  12/17/2018  1:15 PM [facility] 4 [facility] None  12/17/2018  1:45 PM Nicholas Lose, MD [facility] None  01/06/2019  3:00 PM [name], MD [facility] None  04/08/2019  3:00 PM Liane Comber, NP [facility] None  10/15/2019 11:15 AM Vicie Mutters, PA-C [facility] None     Plan:   During the course of the visit the patient was educated and counseled about appropriate screening and preventive services including:    Pneumococcal vaccine   Influenza vaccine  Td vaccine  Screening electrocardiogram  Screening mammography  Bone densitometry screening  Colorectal  cancer screening  Diabetes screening  Glaucoma screening  Nutrition counseling   Advanced directives: given info/requested   Subjective:   Mikayla Harrison is a 85 y.o. female who presents for Medicare Annual Wellness Visit and 3 month follow up on hypertension, prediabetes,  hyperlipidemia, vitamin D def.   She has had ongoing ear pain at her jaw, causes dizziness, states if she opens and closes her mouth she can "pop" her ear and make it feel better/relieve the issues, was given zpak 02/21. She is not on flonase.    On 4/11 she was given keflex and dexamethasone, she states she normal has swelling in her ankles but she did not have any since taking the prednisone for the bug bite but states she still had soreness. She has a history of CREST syndrome, may need to follow up with rheumatology.   She lives by herself, has a lot of anxiety at night, has clorazepate if needed, takes 1/2-1/4 tab occasionally.   BMI is Body mass index is 30.09 kg/m., she has not been working on diet and exercise. Wt Readings from Last 3 Encounters:  08/01/18 167 lb 3.2 oz (75.8 kg)  07/14/18 171 lb 3.2 oz (77.7 kg)  06/30/18 166 lb 3.2 oz (75.4 kg)   Her blood pressure has been controlled at home, today their BP is BP: 129/71. She was switched from ACE to ARB due to cough however she has not switched and states cough got better with ABX/prednisone.  She checks her BP in the AM, at 5 pm occ will do 1/4 of atenolol or 1/2 of amlodipine, then she will check it at 8 and it will begin to accerate  Norvasc atenolol at 5, benazepril at 7 or 8 30.   She does not workout but stays active around the house. She denies chest pain, shortness of breath, dizziness.   She is not on cholesterol medication and denies myalgias. Her cholesterol is at goal. The cholesterol last visit was:   Lab Results  Component Value Date   CHOL 167 10/06/2018   HDL 44 (L) 10/06/2018   LDLCALC 102 (H) 10/06/2018   TRIG 114 10/06/2018   CHOLHDL 3.8 10/06/2018    She has CKD stage III due to HTN. Lab Results  Component Value Date   DTOIZTIW 58 (L) 10/06/2018   She has been working on diet and exercise for prediabetes, and denies paresthesia of the feet, polydipsia, polyuria and visual disturbances. She is on  bASA.  Last A1C in the office was:  Lab Results  Component Value Date   HGBA1C 6.2 (H) 10/06/2018   Patient is on Vitamin D supplement. Lab Results  Component Value Date   VD25OH 26 10/06/2018     She is on thyroid medication. Her medication was not changed last visit.   Lab Results  Component Value Date   TSH 0.50 10/06/2018   Patient was held overnight for observation in May 2018 with a (-) Head CT, MRI & carotid dopplers and she was discharged home on LD bASA for suspected possible TIA.  Medication Review Current Outpatient Medications on File Prior to Visit  Medication Sig  . acetaminophen (TYLENOL) 325 MG tablet Take 325 mg by mouth every 6 (six) hours as needed.  Marland Kitchen amLODipine (NORVASC) 10 MG tablet TAKE 1/2 TO 1 TABLET BY MOUTH DAILY FOR BP  . aspirin 81 MG tablet Take 81 mg by mouth daily.    Marland Kitchen atenolol (TENORMIN) 50 MG tablet Take 1 tablet daily  for BP  . Cholecalciferol (VITAMIN D3) 1000 units CAPS Take 1,000 Units by mouth 2 (two) times daily.  . clorazepate (TRANXENE) 7.5 MG tablet TAKE 1/2 TO 1 (ONE-HALF TO ONE) TABLET BY MOUTH 2 TO 3 TIMES DAILY AS NEEDED FOR ANXIETY  . hyoscyamine (LEVSIN SL) 0.125 MG SL tablet Take 1 to 2 tablets 3 to 4 x /day if needed for Nausea, vomiting, cramping or diarrhea  . levothyroxine (SYNTHROID, LEVOTHROID) 150 MCG tablet TAKE 1 TABLET BY MOUTH ONCE DAILY  . meclizine (ANTIVERT) 25 MG tablet TAKE 1/2 TO 1 (ONE-HALF TO ONE) TABLET BY MOUTH THREE TIMES DAILY FOR  MOTION  SICKNESS/DIZZINESS  . neomycin-colistin-hydrocortisone-thonzonium (CORTISPORIN-TC) 3.08-11-08-0.5 MG/ML OTIC suspension 4-5 drops a day in the affected ear with drainage or pain  . triamcinolone ointment (KENALOG) 0.5 % Apply 1 application topically 2 (two) times daily.  Marland Kitchen olmesartan (BENICAR) 40 MG tablet Take 1 tablet daily for BP (replaces Benazepril) (Patient not taking: Reported on 10/07/2018)   No current facility-administered medications on file prior to visit.      Current Problems (verified) Patient Active Problem List   Diagnosis Date Noted  . Normocytic anemia 06/10/2018  . CREST syndrome ([place]) 02/12/2018  . Abnormal glucose 10/10/2017  . FHx: heart disease 10/10/2017  . History of TIA (transient ischemic attack) 11/06/2016  . Raynaud phenomenon 08/01/2015  . Overweight (BMI 25.0-29.9) 03/22/2015  . 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 CKD Stage III (GFR 56 ml/min) 03/16/2014  . Hyperlipidemia 10/05/2013  . Medication management 10/05/2013  . Vitamin D deficiency 10/05/2013  . Diverticulosis   . Hypertension 01/14/2012  . Hypothyroidism 01/14/2012    Screening Tests Immunization History  Administered Date(s) Administered  . DT 03/22/2015  . Influenza Split 03/04/2013  . Influenza, High Dose Seasonal PF 03/16/2014, 03/22/2015, 02/15/2016, 04/23/2017, 04/30/2018  . Pneumococcal Polysaccharide-23 03/16/2014  . Td 10/10/2017    Preventative care: Last colonoscopy:  2004, will not get another EGD: 2012, Dr. Henrene Pastor Last mammogram: 2011, declines another and declines breast exam at this time.  Last pap smear/pelvic exam: remote DEXA:remote at Dr. Delanna Ahmadi office, willing to get  Prior vaccinations: TD or Tdap:2016 Influenza: 2018 Pneumococcal: 03/2014 Prevnar13: Due 03/2015 Shingles/Zostavax: declines  Names of Other Physician/Practitioners you currently use: 1.  [facility]- here for primary care 2. Dr. Delman Cheadle, eye doctor, last visit 3 years 3. , dentist, last visit 3 years ago, has dentures  Patient Care Team: [name], MD as PCP - General (Internal Medicine)  Allergies Allergies  Allergen Reactions  . Sulfa Antibiotics Other (See Comments)  . Asa [Aspirin] Other (See Comments)    Take 81mg  cannot take 325mg  gets nervous  . Codeine Nausea Only  . Prednisone Nausea And Vomiting    SURGICAL HISTORY She  has a past surgical history that includes Cataract extraction; Vericose vein  (1960); MACULAR TEAR; and Eye surgery. FAMILY HISTORY Her family history includes Colon polyps in her sister; Diabetes in her sister; Heart disease in her brother, father, and mother; Hypertension in her son; Stroke in her brother, mother, and sister. SOCIAL HISTORY She  reports that she quit smoking about 53 years ago. She has a 15.00 pack-year smoking history. She has never used smokeless tobacco. She reports that she does not drink alcohol or use drugs.  MEDICARE WELLNESS OBJECTIVES: Physical activity: yard work and walking Depression/mood screen:   Depression screen [facility] 2/9 10/07/2018  Decreased Interest 0  Down, Depressed, Hopeless 0  PHQ - 2 Score 0  ADLs:  In your present state of health, do you have any difficulty performing the following activities: 10/07/2018 08/02/2018  Hearing? N N  Vision? N N  Difficulty concentrating or making decisions? N N  Walking or climbing stairs? N N  Dressing or bathing? N N  Doing errands, shopping? N N  Some recent data might be hidden    Cognitive Testing  Alert? Yes  Normal Appearance?Yes  Oriented to person? Yes  Place? Yes   Time? Yes  Recall of three objects?  Yes  Can perform simple calculations? Yes  Displays appropriate judgment?Yes  Can read the correct time from a watch face?Yes  EOL planning: Does Patient Have a Medical Advance Directive?: Yes Type of Advance Directive: [place] will Does patient want to make changes to medical advance directive?: No - Patient declined No, declined information   Objective:   Blood pressure 129/71, pulse 62, height 5' 2.5" (1.588 m). Body mass index is 30.09 kg/m.  General Appearance:Well sounding, in no apparent distress.  ENT/Mouth: No hoarseness, No cough for duration of visit.  Respiratory: completing full sentences without distress, without audible wheeze Neuro: Awake and oriented X 3,  Psych:  Insight and Judgment appropriate.     Medicare  Attestation I have personally reviewed: The patient's medical and social history Their use of alcohol, tobacco or illicit drugs Their current medications and supplements The patient's functional ability including ADLs,fall risks, home safety risks, cognitive, and hearing and visual impairment Diet and physical activities Evidence for depression or mood disorders  The patient's weight, height, BMI, and visual acuity have been recorded in the chart.  I have made referrals, counseling, and provided education to the patient based on review of the above and I have provided the patient with a written personalized care plan for preventive services.     Vicie Mutters, PA-C   10/07/2018

## 2018-10-07 ENCOUNTER — Other Ambulatory Visit: Payer: Self-pay

## 2018-10-07 ENCOUNTER — Ambulatory Visit: Payer: Medicare HMO | Admitting: Physician Assistant

## 2018-10-07 ENCOUNTER — Encounter: Payer: Self-pay | Admitting: Physician Assistant

## 2018-10-07 ENCOUNTER — Ambulatory Visit: Payer: Self-pay | Admitting: Physician Assistant

## 2018-10-07 VITALS — BP 129/71 | HR 62 | Ht 62.5 in

## 2018-10-07 DIAGNOSIS — N183 Chronic kidney disease, stage 3 unspecified: Secondary | ICD-10-CM

## 2018-10-07 DIAGNOSIS — Z0001 Encounter for general adult medical examination with abnormal findings: Secondary | ICD-10-CM

## 2018-10-07 DIAGNOSIS — I10 Essential (primary) hypertension: Secondary | ICD-10-CM

## 2018-10-07 DIAGNOSIS — K5790 Diverticulosis of intestine, part unspecified, without perforation or abscess without bleeding: Secondary | ICD-10-CM | POA: Diagnosis not present

## 2018-10-07 DIAGNOSIS — E039 Hypothyroidism, unspecified: Secondary | ICD-10-CM

## 2018-10-07 DIAGNOSIS — Z79899 Other long term (current) drug therapy: Secondary | ICD-10-CM | POA: Diagnosis not present

## 2018-10-07 DIAGNOSIS — M341 CR(E)ST syndrome: Secondary | ICD-10-CM

## 2018-10-07 DIAGNOSIS — D649 Anemia, unspecified: Secondary | ICD-10-CM

## 2018-10-07 DIAGNOSIS — R6889 Other general symptoms and signs: Secondary | ICD-10-CM

## 2018-10-07 DIAGNOSIS — E559 Vitamin D deficiency, unspecified: Secondary | ICD-10-CM | POA: Diagnosis not present

## 2018-10-07 DIAGNOSIS — I493 Ventricular premature depolarization: Secondary | ICD-10-CM | POA: Diagnosis not present

## 2018-10-07 DIAGNOSIS — E782 Mixed hyperlipidemia: Secondary | ICD-10-CM

## 2018-10-07 DIAGNOSIS — I7300 Raynaud's syndrome without gangrene: Secondary | ICD-10-CM

## 2018-10-07 DIAGNOSIS — R7309 Other abnormal glucose: Secondary | ICD-10-CM | POA: Diagnosis not present

## 2018-10-07 DIAGNOSIS — E663 Overweight: Secondary | ICD-10-CM

## 2018-10-07 DIAGNOSIS — Z8673 Personal history of transient ischemic attack (TIA), and cerebral infarction without residual deficits: Secondary | ICD-10-CM

## 2018-10-07 DIAGNOSIS — Z0000 Encounter for general adult medical examination without abnormal findings: Secondary | ICD-10-CM

## 2018-10-07 LAB — CBC WITH DIFFERENTIAL/PLATELET
Absolute Monocytes: 768 cells/uL (ref 200–950)
Basophils Absolute: 56 {cells}/uL (ref 0–200)
Basophils Relative: 0.7 %
Eosinophils Absolute: 432 cells/uL (ref 15–500)
Eosinophils Relative: 5.4 %
HCT: 31 % — ABNORMAL LOW (ref 35.0–45.0)
Hemoglobin: 10.2 g/dL — ABNORMAL LOW (ref 11.7–15.5)
Lymphs Abs: 1784 cells/uL (ref 850–3900)
MCH: 28 pg (ref 27.0–33.0)
MCHC: 32.9 g/dL (ref 32.0–36.0)
MCV: 85.2 fL (ref 80.0–100.0)
MPV: 11.2 fL (ref 7.5–12.5)
Monocytes Relative: 9.6 %
Neutro Abs: 4960 cells/uL (ref 1500–7800)
Neutrophils Relative %: 62 %
Platelets: 269 10*3/uL (ref 140–400)
RBC: 3.64 10*6/uL — ABNORMAL LOW (ref 3.80–5.10)
RDW: 13.2 % (ref 11.0–15.0)
Total Lymphocyte: 22.3 %
WBC: 8 10*3/uL (ref 3.8–10.8)

## 2018-10-07 LAB — COMPLETE METABOLIC PANEL WITH GFR
AG Ratio: 1.3 (calc) (ref 1.0–2.5)
ALT: 12 U/L (ref 6–29)
AST: 15 U/L (ref 10–35)
Albumin: 3.6 g/dL (ref 3.6–5.1)
Alkaline phosphatase (APISO): 51 U/L (ref 37–153)
BUN/Creatinine Ratio: 24 (calc) — ABNORMAL HIGH (ref 6–22)
BUN: 30 mg/dL — ABNORMAL HIGH (ref 7–25)
CO2: 25 mmol/L (ref 20–32)
Calcium: 8.6 mg/dL (ref 8.6–10.4)
Chloride: 107 mmol/L (ref 98–110)
Creat: 1.26 mg/dL — ABNORMAL HIGH (ref 0.60–0.88)
GFR, Est African American: 45 mL/min/{1.73_m2} — ABNORMAL LOW (ref >=60)
GFR, Est Non African American: 39 mL/min/{1.73_m2} — ABNORMAL LOW (ref >=60)
Globulin: 2.7 g/dL (calc) (ref 1.9–3.7)
Glucose, Bld: 104 mg/dL — ABNORMAL HIGH (ref 65–99)
Potassium: 4.4 mmol/L (ref 3.5–5.3)
Sodium: 137 mmol/L (ref 135–146)
Total Bilirubin: 0.3 mg/dL (ref 0.2–1.2)
Total Protein: 6.3 g/dL (ref 6.1–8.1)

## 2018-10-07 LAB — LIPID PANEL
Cholesterol: 167 mg/dL (ref <200)
HDL: 44 mg/dL — ABNORMAL LOW (ref >=50)
LDL Cholesterol (Calc): 102 mg/dL (calc) — ABNORMAL HIGH
Non-HDL Cholesterol (Calc): 123 mg/dL (calc) (ref <130)
Total CHOL/HDL Ratio: 3.8 (calc) (ref <5.0)
Triglycerides: 114 mg/dL (ref <150)

## 2018-10-07 LAB — HEMOGLOBIN A1C
Hgb A1c MFr Bld: 6.2 % of total Hgb — ABNORMAL HIGH (ref <5.7)
Mean Plasma Glucose: 131 (calc)
eAG (mmol/L): 7.3 (calc)

## 2018-10-07 LAB — VITAMIN D 25 HYDROXY (VIT D DEFICIENCY, FRACTURES): Vit D, 25-Hydroxy: 55 ng/mL (ref 30–100)

## 2018-10-07 LAB — TSH: TSH: 0.5 mIU/L (ref 0.40–4.50)

## 2018-10-07 LAB — MAGNESIUM: Magnesium: 2 mg/dL (ref 1.5–2.5)

## 2018-10-16 DIAGNOSIS — T1511XA Foreign body in conjunctival sac, right eye, initial encounter: Secondary | ICD-10-CM | POA: Diagnosis not present

## 2018-10-16 DIAGNOSIS — H2513 Age-related nuclear cataract, bilateral: Secondary | ICD-10-CM | POA: Diagnosis not present

## 2018-10-17 ENCOUNTER — Other Ambulatory Visit: Payer: Self-pay | Admitting: Adult Health

## 2018-10-17 DIAGNOSIS — Z1231 Encounter for screening mammogram for malignant neoplasm of breast: Secondary | ICD-10-CM

## 2018-10-21 ENCOUNTER — Other Ambulatory Visit: Payer: Self-pay | Admitting: *Deleted

## 2018-10-21 MED ORDER — LEVOTHYROXINE SODIUM 150 MCG PO TABS: 150.0000 ug | ORAL_TABLET | Freq: Every day | ORAL | 1 refills | Status: DC

## 2018-10-30 ENCOUNTER — Telehealth: Payer: Self-pay

## 2018-10-30 ENCOUNTER — Telehealth: Payer: Medicare HMO | Admitting: Physician Assistant

## 2018-10-30 DIAGNOSIS — I10 Essential (primary) hypertension: Secondary | ICD-10-CM

## 2018-10-30 DIAGNOSIS — E782 Mixed hyperlipidemia: Secondary | ICD-10-CM

## 2018-10-30 MED ORDER — BENAZEPRIL HCL 40 MG PO TABS: 40.0000 mg | ORAL_TABLET | Freq: Every day | ORAL | 2 refills | Status: DC

## 2018-10-30 NOTE — Telephone Encounter (Signed)
-----   Message from Elenor Quinones, [place] sent at 10/30/2018 11:06 AM EDT ----- Regarding: MED REQUEST Contact: [phone] PER MRS. Karlen   First she states that she never took the [facility] & here is the story as to why   She was on BENAZEPRIL & developed a COUGH that was reported to Dr. Crissie Sickles on a visit & the medication was thought to be the cause.  So a cough medication was given at that time & patient was told to stop the BENAZEPRIL & start [place].  Patient reports that she DID take the cough medication & the cough WENT AWAY, but the patient never started the [facility] & only stopped taking the BENAZEPRIL for a short period of time. (BENAZEPRIL restarted) which the cough never returned after restarting the BENAZEPRIL   So MRS. Reveles states she WILL NOT start the [place] will like you refill her BENAZEPRIL because she is down to her LAST tablet.  She stated again before hanging up that she will NOT take the [place] would like a CALL from you to talk more about this if refilling the BENAZEPRIL is a problem.  Please advise  (High priority per patient)

## 2018-10-30 NOTE — Telephone Encounter (Signed)
   CCM telephone visit   Current Outpatient Medications (Endocrine & Metabolic):  .  levothyroxine (SYNTHROID) 150 MCG tablet, Take 1 tablet (150 mcg total) by mouth daily.  Current Outpatient Medications (Cardiovascular):  .  amLODipine (NORVASC) 10 MG tablet, TAKE 1/2 TO 1 TABLET BY MOUTH DAILY FOR BP .  atenolol (TENORMIN) 50 MG tablet, Take 1 tablet daily for BP .  benazepril (LOTENSIN) 40 MG tablet, Take 1 tablet (40 mg total) by mouth daily.   Current Outpatient Medications (Analgesics):  .  acetaminophen (TYLENOL) 325 MG tablet, Take 325 mg by mouth every 6 (six) hours as needed. Marland Kitchen  aspirin 81 MG tablet, Take 81 mg by mouth daily.     Current Outpatient Medications (Other):  Marland Kitchen  Cholecalciferol (VITAMIN D3) 1000 units CAPS, Take 1,000 Units by mouth 2 (two) times daily. .  clorazepate (TRANXENE) 7.5 MG tablet, TAKE 1/2 TO 1 (ONE-HALF TO ONE) TABLET BY MOUTH 2 TO 3 TIMES DAILY AS NEEDED FOR ANXIETY .  hyoscyamine (LEVSIN SL) 0.125 MG SL tablet, Take 1 to 2 tablets 3 to 4 x /day if needed for Nausea, vomiting, cramping or diarrhea .  meclizine (ANTIVERT) 25 MG tablet, TAKE 1/2 TO 1 (ONE-HALF TO ONE) TABLET BY MOUTH THREE TIMES DAILY FOR  MOTION  SICKNESS/DIZZINESS .  neomycin-colistin-hydrocortisone-thonzonium (CORTISPORIN-TC) 3.08-11-08-0.5 MG/ML OTIC suspension, 4-5 drops a day in the affected ear with drainage or pain .  triamcinolone ointment (KENALOG) 0.5 %, Apply 1 application topically 2 (two) times daily.  Medication adherence: will do as she wishes Any Side effects?no Refills needed:yes  Goals Reviewed:  Goals    . Blood Pressure < 140/80     -check BP in the morning and at 9 oclock - take benazepril and bASA in the morning, take norvasc 1/2 at 7 without checking BP before that - if your BP is over 140/80 at 10 pm, you can take 1/2 of the atenolol       Any patient concerns?:  Support and does not want meds changed  Plan: Patient would like to restart  benazepril, was stopped due to cough and given benicar BUT the cough went away with cough syrup and she would prefer to stay on the medication that she knows.   Will continue to monitor for cough.   Time Spent: [address], PA-C

## 2018-10-30 NOTE — Telephone Encounter (Signed)
-----   Message from Vicie Mutters, [place] sent at 10/29/2018  1:31 PM EDT ----- Regarding: RE: med refill/request Contact: [phone] Switched to benicar/olmesartan.  Estill Bamberg        ----- Message ----- From: Elenor Quinones, CMA Sent: 10/29/2018   1:22 PM EDT To: Vicie Mutters, PA-C Subject: med refill/request                             BENAZEPRIL has been discontinued .  But patient requests a refill a this medication.  Not sure why it was discontinued but I'm unsure on what should be done \.  Please advise on what to do

## 2018-10-30 NOTE — Telephone Encounter (Signed)
Provider has been addressed the message.

## 2018-11-04 ENCOUNTER — Encounter: Payer: Self-pay | Admitting: Internal Medicine

## 2018-11-14 DIAGNOSIS — Z789 Other specified health status: Secondary | ICD-10-CM | POA: Insufficient documentation

## 2018-11-14 NOTE — Progress Notes (Signed)
Assessment and Plan:  Hypertension, unspecified type Take the benazapril every morning like you have Take the norvasc 1/4 every day with supper Take the atenolol 1/4 pill If your top number is above 150.   Mixed hyperlipidemia check lipids decrease fatty foods increase activity.   CKD Stage III (GFR 56 ml/min) -     CBC with Differential/Platelet -     COMPLETE METABOLIC PANEL WITH GFR -     TSH  Chronic bilateral low back pain without sciatica -     Ambulatory referral to Orthopedics  Dyspnea on exertion -     Ambulatory referral to Cardiology -     EKG 12-Lead -     DG Chest 2 View; Future ? Amount of anxiety/OCD- will check BP 5-8 times at night in a row.  - very concerned Will refer due to anxiety Go to the ER if any chest pain, shortness of breath, nausea, dizziness, severe HA, changes vision/speech  Iron deficiency -     Iron,Total/Total Iron Binding Cap  B12 deficiency -     Vitamin B12  Medication management -     Magnesium  Fatigue ? Amount of anxiety/OCD- will check BP 5-8 times at night in a row.  - very concerned Will refer due to anxiety to cardiology and ortho Declines medication or counseling for anxiety Go to the ER if any chest pain, shortness of breath, nausea, dizziness, severe HA, changes vision/speech   Comprehensive care plan:  Patient/caregiver was given comprehensive care plan We will continue to monitor these goals every 3 months with an office visit and every month by a telephone call  Patient can contact the office any time with the phone number and get to a provider via the answering service or they can use Mychart.   Verbal permission was received from the patient to review comprehensive care management, they understand they have the right to stop CCM services at any time.   The patient is self managing medications at home.  Medications were reviewed with the patient today as well as adherence and potential interactions.    The patient does not need home health services at this time.   Future Appointments  Date Time Provider [place]  12/17/2018  1:15 PM [facility] 4 [facility] None  12/17/2018  1:45 PM Nicholas Lose, MD [facility] None  01/06/2019  3:00 PM [name], MD [facility] None  04/08/2019  3:00 PM Liane Comber, NP [facility] None  10/15/2019 11:15 AM Vicie Mutters, PA-C [facility] None     HPI 85 y.o.female has Hypertension; Hypothyroidism; Diverticulosis; Hyperlipidemia; Medication management; Vitamin D deficiency; CKD Stage III (GFR 56 ml/min); Unifocal PVCs; Overweight (BMI 25.0-29.9); Raynaud phenomenon; History of TIA (transient ischemic attack); Abnormal glucose; FHx: heart disease; CREST syndrome ([place]); Normocytic anemia; and Enrolled in chronic care management on their problem list. presents for questions.  She is on CCM.  She states she is very tired, worse with going to the mail box and walking. She states her legs are exhausted with this, she states her legs hurt with standing and walking not with sitting. She states she has split level and she has SOB with going up stairs. No chest pain, no palpitations.  She states she has burning and cold bilateral feet.   She states she has racing thoughts, constant worry, and that her mind will not shut down.  States she is not anxious she is just concerned and does now want to die from her HTN and  kidney like her husband, brother and dad.  She does not take NSAIDS.   She will take her BP 4-6 times at night, states it will be elevated at 1030.  Takes benzapril  Lab Results  Component Value Date   GFRNONAA 39 (L) 10/06/2018     Current Outpatient Medications (Endocrine & Metabolic):  .  levothyroxine (SYNTHROID) 150 MCG tablet, Take 1 tablet (150 mcg total) by mouth daily.  Current Outpatient Medications (Cardiovascular):  .  amLODipine (NORVASC) 10 MG tablet, TAKE 1/2 TO 1 TABLET BY MOUTH DAILY FOR BP .  atenolol  (TENORMIN) 50 MG tablet, Take 1 tablet daily for BP .  benazepril (LOTENSIN) 40 MG tablet, Take 1 tablet (40 mg total) by mouth daily.   Current Outpatient Medications (Analgesics):  .  acetaminophen (TYLENOL) 325 MG tablet, Take 325 mg by mouth every 6 (six) hours as needed. Marland Kitchen  aspirin 81 MG tablet, Take 81 mg by mouth daily.     Current Outpatient Medications (Other):  Marland Kitchen  Cholecalciferol (VITAMIN D3) 1000 units CAPS, Take 1,000 Units by mouth 2 (two) times daily. .  clorazepate (TRANXENE) 7.5 MG tablet, TAKE 1/2 TO 1 (ONE-HALF TO ONE) TABLET BY MOUTH 2 TO 3 TIMES DAILY AS NEEDED FOR ANXIETY .  hyoscyamine (LEVSIN SL) 0.125 MG SL tablet, Take 1 to 2 tablets 3 to 4 x /day if needed for Nausea, vomiting, cramping or diarrhea .  meclizine (ANTIVERT) 25 MG tablet, TAKE 1/2 TO 1 (ONE-HALF TO ONE) TABLET BY MOUTH THREE TIMES DAILY FOR  MOTION  SICKNESS/DIZZINESS .  neomycin-colistin-hydrocortisone-thonzonium (CORTISPORIN-TC) 3.08-11-08-0.5 MG/ML OTIC suspension, 4-5 drops a day in the affected ear with drainage or pain .  triamcinolone ointment (KENALOG) 0.5 %, Apply 1 application topically 2 (two) times daily.   Past Medical History:  Diagnosis Date  . CKD (chronic kidney disease) stage 3, GFR 30-59 ml/min (HCC)   . Colon polyp   . Diverticulosis   . Hypertension   . Iron deficiency   . Prediabetes   . Prediabetes   . Small bowel obstruction (HCC)      Allergies  Allergen Reactions  . Sulfa Antibiotics Other (See Comments)  . Asa [Aspirin] Other (See Comments)    Take 81mg  cannot take 325mg  gets nervous  . Codeine Nausea Only  . Prednisone Nausea And Vomiting    ROS: all negative except above.   Physical Exam: Filed Weights   11/17/18 1152  Weight: 167 lb (75.8 kg)   BP 122/74   Pulse 64   Temp 97.6 F (36.4 C)   Ht 5' 2.5" (1.588 m)   Wt 167 lb (75.8 kg)   SpO2 97%   BMI 30.06 kg/m  General Appearance: Well nourished, in no apparent distress. Eyes: PERRLA, EOMs,  conjunctiva no swelling or erythema Sinuses: No Frontal/maxillary tenderness ENT/Mouth: Ext aud canals clear, TMs without erythema, bulging. No erythema, swelling, or exudate on post pharynx.  Tonsils not swollen or erythematous. Hearing normal.  Neck: Supple, thyroid normal.  Respiratory: Respiratory effort normal, BS equal bilaterally without rales, rhonchi, wheezing or stridor.  Cardio: RRR with no MRGs. Brisk peripheral pulses without edema.  Abdomen: Soft, + BS.  Non tender, no guarding, rebound, hernias, masses. Lymphatics: Non tender without lymphadenopathy.  Musculoskeletal: Full ROM, 5/5 strength, normal gait.  Skin: Warm, dry without rashes, lesions, ecchymosis.  Neuro: Cranial nerves intact. Normal muscle tone, no cerebellar symptoms. Sensation intact.  Psych: Awake and oriented X 3, normal affect, Insight and Judgment appropriate.  Vicie Mutters, PA-C 1:22 PM [facility]

## 2018-11-17 ENCOUNTER — Telehealth: Payer: Self-pay | Admitting: Internal Medicine

## 2018-11-17 ENCOUNTER — Ambulatory Visit (INDEPENDENT_AMBULATORY_CARE_PROVIDER_SITE_OTHER): Payer: Medicare HMO | Admitting: Physician Assistant

## 2018-11-17 ENCOUNTER — Other Ambulatory Visit: Payer: Self-pay

## 2018-11-17 ENCOUNTER — Encounter: Payer: Self-pay | Admitting: Physician Assistant

## 2018-11-17 ENCOUNTER — Telehealth: Payer: Self-pay | Admitting: Cardiology

## 2018-11-17 VITALS — BP 122/74 | HR 64 | Temp 97.6°F | Ht 62.5 in | Wt 167.0 lb

## 2018-11-17 DIAGNOSIS — G8929 Other chronic pain: Secondary | ICD-10-CM

## 2018-11-17 DIAGNOSIS — R0609 Other forms of dyspnea: Secondary | ICD-10-CM | POA: Diagnosis not present

## 2018-11-17 DIAGNOSIS — M545 Low back pain, unspecified: Secondary | ICD-10-CM

## 2018-11-17 DIAGNOSIS — E782 Mixed hyperlipidemia: Secondary | ICD-10-CM

## 2018-11-17 DIAGNOSIS — Z789 Other specified health status: Secondary | ICD-10-CM

## 2018-11-17 DIAGNOSIS — I10 Essential (primary) hypertension: Secondary | ICD-10-CM

## 2018-11-17 DIAGNOSIS — E611 Iron deficiency: Secondary | ICD-10-CM | POA: Diagnosis not present

## 2018-11-17 DIAGNOSIS — N183 Chronic kidney disease, stage 3 unspecified: Secondary | ICD-10-CM

## 2018-11-17 DIAGNOSIS — E538 Deficiency of other specified B group vitamins: Secondary | ICD-10-CM

## 2018-11-17 DIAGNOSIS — Z79899 Other long term (current) drug therapy: Secondary | ICD-10-CM

## 2018-11-17 DIAGNOSIS — R0600 Dyspnea, unspecified: Secondary | ICD-10-CM

## 2018-11-17 NOTE — Telephone Encounter (Signed)
error 

## 2018-11-17 NOTE — Telephone Encounter (Signed)
LVM for patient to call and schedule new patient appointment.

## 2018-11-17 NOTE — Patient Instructions (Addendum)
Take the benazapril every morning like you have Take the norvasc 1/4 every day with supper Take the atenolol 1/4 pill If your top number is above 150.   INFORMATION ABOUT YOUR XRAY  Can walk into [address] for an Insurance account manager. They will have the order and take you back. You do not any paper work, I should get the result back today or tomorrow. This order is good for a year.  Can call [phone] to schedule an appointment if you wish.    Spinal Stenosis  Spinal stenosis occurs when the open space (spinal canal) between the bones of your spine (vertebrae) narrows, putting pressure on the spinal cord or nerves. What are the causes? This condition is caused by areas of bone pushing into the central canals of your vertebrae. This condition may be present at birth (congenital), or it may be caused by:  Arthritic deterioration of your vertebrae (spinal degeneration). This usually starts around age 64.  Injury or trauma to the spine.  Tumors in the spine.  Calcium deposits in the spine. What are the signs or symptoms? Symptoms of this condition include:  Pain in the neck or back that is generally worse with activities, particularly when standing and walking.  Numbness, tingling, hot or cold sensations, weakness, or weariness in your legs.  Pain going up and down the leg (sciatica).  Frequent episodes of falling.  A foot-slapping gait that leads to muscle weakness. In more serious cases, you may develop:  Problemspassing stool or passing urine.  Difficulty having sex.  Loss of feeling in part or all of your leg. Symptoms may come on slowly and get worse over time. How is this diagnosed? This condition is diagnosed based on your medical history and a physical exam. Tests will also be done, such as:  MRI.  CT scan.  X-ray. How is this treated? Treatment for this condition often focuses on managing your pain and any other symptoms. Treatment may  include:  Practicing good posture to lessen pressure on your nerves.  Exercising to strengthen muscles, build endurance, improve balance, and maintain good joint movement (range of motion).  Losing weight, if needed.  Taking medicines to reduce swelling, inflammation, or pain.  Assistive devices, such as a corset or brace. In some cases, surgery may be needed. The most common procedure is decompression laminectomy. This is done to remove excess bone that puts pressure on your nerve roots. Follow these instructions at home: Managing pain, stiffness, and swelling  Do all exercises and stretches as told by your health care provider.  Practice good posture. If you were given a brace or a corset, wear it as told by your health care provider.  Do not do any activities that cause pain. Ask your health care provider what activities are safe for you.  Do not lift anything that is heavier than 10 lb (4.5 kg) or the limit that your health care provider tells you.  Maintain a healthy weight. Talk with your health care provider if you need help losing weight.  If directed, apply heat to the affected area as often as told by your health care provider. Use the heat source that your health care provider recommends, such as a moist heat pack or a heating pad. ? Place a towel between your skin and the heat source. ? Leave the heat on for 20-30 minutes. ? Remove the heat if your skin turns bright red. This is especially important if you are not able to  feel pain, heat, or cold. You may have a greater risk of getting burned. General instructions  Take over-the-counter and prescription medicines only as told by your health care provider.  Do not use any products that contain nicotine or tobacco, such as cigarettes and e-cigarettes. If you need help quitting, ask your health care provider.  Eat a healthy diet. This includes plenty of fruits and vegetables, whole grains, and low-fat (lean)  protein.  Keep all follow-up visits as told by your health care provider. This is important. Contact a health care provider if:  Your symptoms do not get better or they get worse.  You have a fever. Get help right away if:  You have new or worse pain in your neck or upper back.  You have severe pain that cannot be controlled with medicines.  You are dizzy.  You have vision problems, blurred vision, or double vision.  You have a severe headache that is worse when you stand.  You have nausea or you vomit.  You develop new or worse numbness or tingling in your back or legs.  You have pain, redness, swelling, or warmth in your arm or leg. Summary  Spinal stenosis occurs when the open space (spinal canal) between the bones of your spine (vertebrae) narrows. This narrowing puts pressure on the spinal cord or nerves.  Spinal stenosis can cause numbness, weakness, or pain in the neck, back, and legs.  This condition may be caused by a birth defect, arthritic deterioration of your vertebrae, injury, tumors, or calcium deposits.  This condition is usually diagnosed with MRIs, CT scans, and X-rays. This information is not intended to replace advice given to you by your health care provider. Make sure you discuss any questions you have with your health care provider. Document Released: 08/18/2003 Document Revised: 05/02/2016 Document Reviewed: 05/02/2016 Elsevier Interactive Patient Education  2019 [place] of Breath, Adult Shortness of breath is when a person has trouble breathing enough air or when a person feels like she or he is having trouble breathing in enough air. Shortness of breath could be a sign of a medical problem. Follow these instructions at home:   Pay attention to any changes in your symptoms.  Do not use any products that contain nicotine or tobacco, such as cigarettes, e-cigarettes, and chewing tobacco.  Do not smoke. Smoking is a common cause  of shortness of breath. If you need help quitting, ask your health care provider.  Avoid things that can irritate your airways, such as: ? Mold. ? Dust. ? Air pollution. ? Chemical fumes. ? Things that can cause allergy symptoms (allergens), if you have allergies.  Keep your living space clean and free of mold and dust.  Rest as needed. Slowly return to your usual activities.  Take over-the-counter and prescription medicines only as told by your health care provider. This includes oxygen therapy and inhaled medicines.  Keep all follow-up visits as told by your health care provider. This is important. Contact a health care provider if:  Your condition does not improve as soon as expected.  You have a hard time doing your normal activities, even after you rest.  You have new symptoms. Get help right away if:  Your shortness of breath gets worse.  You have shortness of breath when you are resting.  You feel light-headed or you faint.  You have a cough that is not controlled with medicines.  You cough up blood.  You have pain with breathing.  You have pain in your chest, arms, shoulders, or abdomen.  You have a fever.  You cannot walk up stairs or exercise the way that you normally do. These symptoms may represent a serious problem that is an emergency. Do not wait to see if the symptoms will go away. Get medical help right away. Call your local emergency services (911 in the [place] Do not drive yourself to the hospital. Summary  Shortness of breath is when a person has trouble breathing enough air. It can be a sign of a medical problem.  Avoid things that irritate your lungs, such as smoking, pollution, mold, and dust.  Pay attention to changes in your symptoms and contact your health care provider if you have a hard time completing daily activities because of shortness of breath. This information is not intended to replace advice given to you by your health care  provider. Make sure you discuss any questions you have with your health care provider. Document Released: 02/20/2001 Document Revised: 10/28/2017 Document Reviewed: 10/28/2017 Elsevier Interactive Patient Education  2019 [employer].

## 2018-11-18 LAB — CBC WITH DIFFERENTIAL/PLATELET
Absolute Monocytes: 648 cells/uL (ref 200–950)
Basophils Absolute: 58 cells/uL (ref 0–200)
Basophils Relative: 0.8 %
Eosinophils Absolute: 252 {cells}/uL (ref 15–500)
Eosinophils Relative: 3.5 %
HCT: 33.8 % — ABNORMAL LOW (ref 35.0–45.0)
Hemoglobin: 10.9 g/dL — ABNORMAL LOW (ref 11.7–15.5)
Lymphs Abs: 1757 cells/uL (ref 850–3900)
MCH: 28.5 pg (ref 27.0–33.0)
MCHC: 32.2 g/dL (ref 32.0–36.0)
MCV: 88.5 fL (ref 80.0–100.0)
MPV: 11.5 fL (ref 7.5–12.5)
Monocytes Relative: 9 %
Neutro Abs: 4486 cells/uL (ref 1500–7800)
Neutrophils Relative %: 62.3 %
Platelets: 244 10*3/uL (ref 140–400)
RBC: 3.82 10*6/uL (ref 3.80–5.10)
RDW: 13.3 % (ref 11.0–15.0)
Total Lymphocyte: 24.4 %
WBC: 7.2 10*3/uL (ref 3.8–10.8)

## 2018-11-18 LAB — IRON, TOTAL/TOTAL IRON BINDING CAP
%SAT: 22 % (calc) (ref 16–45)
Iron: 73 ug/dL (ref 45–160)
TIBC: 326 mcg/dL (calc) (ref 250–450)

## 2018-11-18 LAB — MAGNESIUM: Magnesium: 2 mg/dL (ref 1.5–2.5)

## 2018-11-18 LAB — COMPLETE METABOLIC PANEL WITH GFR
AG Ratio: 1.4 (calc) (ref 1.0–2.5)
ALT: 12 U/L (ref 6–29)
AST: 21 U/L (ref 10–35)
Albumin: 4 g/dL (ref 3.6–5.1)
Alkaline phosphatase (APISO): 67 U/L (ref 37–153)
BUN/Creatinine Ratio: 22 (calc) (ref 6–22)
BUN: 25 mg/dL (ref 7–25)
CO2: 26 mmol/L (ref 20–32)
Calcium: 9.2 mg/dL (ref 8.6–10.4)
Chloride: 104 mmol/L (ref 98–110)
Creat: 1.16 mg/dL — ABNORMAL HIGH (ref 0.60–0.88)
GFR, Est African American: 50 mL/min/{1.73_m2} — ABNORMAL LOW (ref >=60)
GFR, Est Non African American: 43 mL/min/{1.73_m2} — ABNORMAL LOW (ref >=60)
Globulin: 2.8 g/dL (calc) (ref 1.9–3.7)
Glucose, Bld: 92 mg/dL (ref 65–99)
Potassium: 4.6 mmol/L (ref 3.5–5.3)
Sodium: 138 mmol/L (ref 135–146)
Total Bilirubin: 0.4 mg/dL (ref 0.2–1.2)
Total Protein: 6.8 g/dL (ref 6.1–8.1)

## 2018-11-18 LAB — VITAMIN B12: Vitamin B-12: >2000 pg/mL — ABNORMAL HIGH (ref 200–1100)

## 2018-11-18 LAB — TSH: TSH: 1.12 mIU/L (ref 0.40–4.50)

## 2018-11-26 ENCOUNTER — Other Ambulatory Visit: Payer: Self-pay | Admitting: Adult Health Nurse Practitioner

## 2018-11-26 NOTE — Progress Notes (Unsigned)
d89.9

## 2018-12-01 ENCOUNTER — Other Ambulatory Visit: Payer: Self-pay

## 2018-12-01 ENCOUNTER — Ambulatory Visit: Payer: Medicare HMO | Admitting: Orthopedic Surgery

## 2018-12-10 NOTE — Assessment & Plan Note (Deleted)
Anemia of chronic kidney disease stage III  Prior work-up Iron studies: Iron saturation 20%, ferritin 36 O83 and folic acid normal SPEP: No M protein Serum creatinine: 1.34, BUN 28 Haptoglobin, reticulocyte count, LDH did not show evidence of hemolysis Erythropoietin: 8.7 (inappropriately low) ------------------------------------------------------------------------------- Current labs: If the hemoglobin drops below 10 we will initiate ESA therapy.  Return to clinic in 6 months with labs and follow-up.

## 2018-12-17 ENCOUNTER — Encounter: Payer: Self-pay | Admitting: Orthopedic Surgery

## 2018-12-17 ENCOUNTER — Ambulatory Visit (INDEPENDENT_AMBULATORY_CARE_PROVIDER_SITE_OTHER): Payer: Medicare HMO

## 2018-12-17 ENCOUNTER — Ambulatory Visit (INDEPENDENT_AMBULATORY_CARE_PROVIDER_SITE_OTHER): Payer: Medicare HMO | Admitting: Orthopedic Surgery

## 2018-12-17 ENCOUNTER — Ambulatory Visit: Payer: Medicare HMO | Admitting: Hematology and Oncology

## 2018-12-17 ENCOUNTER — Other Ambulatory Visit: Payer: Medicare HMO

## 2018-12-17 ENCOUNTER — Other Ambulatory Visit: Payer: Self-pay

## 2018-12-17 ENCOUNTER — Ambulatory Visit: Payer: Self-pay

## 2018-12-17 DIAGNOSIS — M25561 Pain in right knee: Secondary | ICD-10-CM

## 2018-12-17 DIAGNOSIS — M170 Bilateral primary osteoarthritis of knee: Secondary | ICD-10-CM | POA: Diagnosis not present

## 2018-12-17 DIAGNOSIS — M545 Low back pain, unspecified: Secondary | ICD-10-CM

## 2018-12-17 DIAGNOSIS — M25562 Pain in left knee: Secondary | ICD-10-CM | POA: Diagnosis not present

## 2018-12-17 NOTE — Progress Notes (Signed)
Office Visit Note   Patient: Mikayla Harrison           Date of Birth: January 03, 1932           MRN: [id number] Visit Date: 12/17/2018 Requested by: [name], [address] [place],  [place] [address] PCP: [name], MD  Subjective: Chief Complaint  Patient presents with  . Right Knee - Pain  . Left Knee - Pain    HPI: Mikayla Harrison is a patient with bilateral knee pain left worse than right.  Describes left knee injury about 2 years ago.  She is having knee pain which actually wakes her from sleep.  Denies any weakness giving way locking popping or mechanical symptoms.  She does have difficulty with stairs.  She also reports burning in both feet which really occurs when she is walking.  Gets better when she is sitting.  Denies any real radicular symptoms.  Denies any groin pain but does report some dull aching type pain in bilateral hip and buttock region.  She does have stage I kidney disease and cannot take anti-inflammatories.  She does take occasional Tylenol for the problem.  She has seen a rheumatologist after describing history of having rheumatic fever at age 37.              ROS: All systems reviewed are negative as they relate to the chief complaint within the history of present illness.  Patient denies  fevers or chills.   Assessment & Plan: Visit Diagnoses:  1. Pain in both knees, unspecified chronicity   2. Low back pain, unspecified back pain laterality, unspecified chronicity, unspecified whether sciatica present     Plan: Impression is bilateral knee arthritis left worse than right.  Inject the left knee today and will see her back in a week for scheduled injection into the right knee.  I think she also needs MRI of the lumbar spine to evaluate for spinal stenosis.  Because of the activity related nature of her symptoms this does not really look like neuropathy.  I will see her back next week for right knee injection.  Follow-Up Instructions: No  follow-ups on file.   Orders:  Orders Placed This Encounter  Procedures  . XR KNEE 3 VIEW RIGHT  . XR KNEE 3 VIEW LEFT  . XR Lumbar Spine 2-3 Views   No orders of the defined types were placed in this encounter.     Procedures: Large Joint Inj: L knee on 12/18/2018 9:28 PM Indications: diagnostic evaluation, joint swelling and pain Details: 18 G 1.5 in needle, superolateral approach  Arthrogram: No  Medications: 5 mL lidocaine 1 %; 40 mg methylPREDNISolone acetate 40 MG/ML; 4 mL bupivacaine 0.25 % Outcome: tolerated well, no immediate complications Procedure, treatment alternatives, risks and benefits explained, specific risks discussed. Consent was given by the patient. Immediately prior to procedure a time out was called to verify the correct patient, procedure, equipment, support staff and site/side marked as required. Patient was prepped and draped in the usual sterile fashion.       Clinical Data: No additional findings.  Objective: Vital Signs: There were no vitals taken for this visit.  Physical Exam:   Constitutional: Patient appears well-developed HEENT:  Head: Normocephalic Eyes:EOM are normal Neck: Normal range of motion Cardiovascular: Normal rate Pulmonary/chest: Effort normal Neurologic: Patient is alert Skin: Skin is warm Psychiatric: Patient has normal mood and affect    Ortho Exam: Ortho exam demonstrates full active and passive  range of motion of the ankles and hips.  No real groin pain with internal X rotation of the leg.  Pedal pulses palpable.  Not much pitting edema bilateral lower extremities.  Patient has diffuse periretinacular tenderness bilaterally with pretty reasonable range of motion from near full extension to flexion past 90 in both knees.  No effusion in either knee.  Patient has no nerve root tension signs.  Symmetric reflexes bilateral patella and Achilles.  Mild pain with forward lateral bending.  No real trochanteric tenderness is  noted.  Symmetric muscle strength bilaterally.  Specialty Comments:  No specialty comments available.  Imaging: Xr Knee 3 View Left  Result Date: 12/17/2018 AP lateral merchant left knee reviewed.  Patient does have moderate lateral compartment degenerative changes.  Mild to moderate medial compartment changes and patellofemoral changes also noted.  Alignment neutral to slight valgus on that left knee.  No acute fracture or dislocation.  Xr Knee 3 View Right  Result Date: 12/17/2018 AP lateral merchant view right knee obtained.  Patient has moderate medial compartment arthritis.  Patellofemoral degenerative changes also noted.  Lateral compartment appears relatively spared.  Slight varus alignment noted.  No acute fracture or dislocation.  Xr Lumbar Spine 2-3 Views  Result Date: 12/17/2018 AP lateral lumbar spine reviewed.  Patient has mild facet arthritis.  Mild degenerative changes between the vertebral bodies in the lower lumbar spine.  Calcification of the aorta is present.  No spondylolisthesis or compression fractures present.  Visualized hips appear normal.    PMFS History: Patient Active Problem List   Diagnosis Date Noted  . Enrolled in chronic care management 11/14/2018  . Normocytic anemia 06/10/2018  . CREST syndrome ([place]) 02/12/2018  . Abnormal glucose 10/10/2017  . FHx: heart disease 10/10/2017  . History of TIA (transient ischemic attack) 11/06/2016  . Raynaud phenomenon 08/01/2015  . Overweight (BMI 25.0-29.9) 03/22/2015  . 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 CKD Stage III (GFR 56 ml/min) 03/16/2014  . Hyperlipidemia 10/05/2013  . Medication management 10/05/2013  . Vitamin D deficiency 10/05/2013  . Diverticulosis   . Hypertension 01/14/2012  . Hypothyroidism 01/14/2012   Past Medical History:  Diagnosis Date  . CKD (chronic kidney disease) stage 3, GFR 30-59 ml/min (HCC)   . Colon polyp   . Diverticulosis   . Hypertension   . Iron deficiency   . Prediabetes   .  Prediabetes   . Small bowel obstruction (HCC)     Family History  Problem Relation Age of Onset  . Heart disease Mother   . Stroke Mother   . Heart disease Father   . Colon polyps Sister   . Diabetes Sister   . Stroke Sister   . Heart disease Brother   . Stroke Brother   . Hypertension Son     Past Surgical History:  Procedure Laterality Date  . CATARACT EXTRACTION    . EYE SURGERY    . MACULAR TEAR    . Vericose vein  1960   Social History   Occupational History  . Not on file  Tobacco Use  . Smoking status: Former Smoker    Packs/day: 1.00    Years: 15.00    Pack years: 15.00    Quit date: 06/11/1965    Years since quitting: 53.5  . Smokeless tobacco: Never Used  Substance and Sexual Activity  . Alcohol use: No  . Drug use: No  . Sexual activity: Not Currently

## 2018-12-18 ENCOUNTER — Encounter: Payer: Self-pay | Admitting: Orthopedic Surgery

## 2018-12-18 DIAGNOSIS — M545 Low back pain: Secondary | ICD-10-CM | POA: Diagnosis not present

## 2018-12-18 DIAGNOSIS — M170 Bilateral primary osteoarthritis of knee: Secondary | ICD-10-CM | POA: Diagnosis not present

## 2018-12-18 DIAGNOSIS — M25561 Pain in right knee: Secondary | ICD-10-CM | POA: Diagnosis not present

## 2018-12-18 MED ORDER — BUPIVACAINE HCL 0.25 % IJ SOLN
4.0000 mL | INTRAMUSCULAR | Status: AC | PRN
  Administered 2018-12-18: 4 mL via INTRA_ARTICULAR

## 2018-12-18 MED ORDER — METHYLPREDNISOLONE ACETATE 40 MG/ML IJ SUSP
40.0000 mg | INTRAMUSCULAR | Status: AC | PRN
  Administered 2018-12-18: 40 mg via INTRA_ARTICULAR

## 2018-12-18 MED ORDER — LIDOCAINE HCL 1 % IJ SOLN
5.0000 mL | INTRAMUSCULAR | Status: AC | PRN
  Administered 2018-12-18: 5 mL

## 2018-12-24 ENCOUNTER — Ambulatory Visit: Payer: Medicare HMO | Admitting: Orthopedic Surgery

## 2018-12-31 ENCOUNTER — Ambulatory Visit: Payer: Medicare HMO | Admitting: Orthopedic Surgery

## 2019-01-05 ENCOUNTER — Encounter: Payer: Self-pay | Admitting: Internal Medicine

## 2019-01-05 NOTE — Patient Instructions (Signed)
- Vit D  And Vit C 1,000 mg  are recommended to help protect  against the Covid_19 and other Corona viruses.   - Also it's recommended to take Zinc 50 mg to help  protect against the Covid_19  And best place to get  is also on [employer] and don't pay more than 6-8 cents /pill !   ================================ Coronavirus (COVID-19) Are you at risk?  Are you at risk for the Coronavirus (COVID-19)?  To be considered HIGH RISK for Coronavirus (COVID-19), you have to meet the following criteria:  . Traveled to [place], [place], [place], [place] or [place]; or in the [place] to [place], [place], [place]  . or [place]; and have fever, cough, and shortness of breath within the last 2 weeks of travel OR . Been in close contact with a person diagnosed with COVID-19 within the last 2 weeks and have  . fever, cough,and shortness of breath .  . IF YOU DO NOT MEET THESE CRITERIA, YOU ARE CONSIDERED LOW RISK FOR COVID-19.  What to do if you are HIGH RISK for COVID-19?  Marland Kitchen If you are having a medical emergency, call 911. . Seek medical care right away. Before you go to a doctor's office, urgent care or emergency department, .  call ahead and tell them about your recent travel, contact with someone diagnosed with COVID-19  .  and your symptoms.  . You should receive instructions from your physician's office regarding next steps of care.  . When you arrive at healthcare provider, tell the healthcare staff immediately you have returned from  . visiting [place], [place], [place], [place] or [place]; or traveled in the [place] to [place], [place],  . [place] or [place] in the last two weeks or you have been in close contact with a person diagnosed with  . COVID-19 in the last 2 weeks.   . Tell the health care staff about your symptoms: fever, cough and shortness of breath. . After you have been seen by a medical provider, you will be either: o Tested for (COVID-19) and  discharged home on quarantine except to seek medical care if  o symptoms worsen, and asked to  - Stay home and avoid contact with others until you get your results (4-5 days)  - Avoid travel on public transportation if possible (such as bus, train, or airplane) or o Sent to the Emergency Department by EMS for evaluation, COVID-19 testing  and  o possible admission depending on your condition and test results.  What to do if you are LOW RISK for COVID-19?  Reduce your risk of any infection by using the same precautions used for avoiding the common cold or flu:  Marland Kitchen Wash your hands often with soap and warm water for at least 20 seconds.  If soap and water are not readily available,  . use an alcohol-based hand sanitizer with at least 60% alcohol.  . If coughing or sneezing, cover your mouth and nose by coughing or sneezing into the elbow areas of your shirt or coat, .  into a tissue or into your sleeve (not your hands). . Avoid shaking hands with others and consider head nods or verbal greetings only. . Avoid touching your eyes, nose, or mouth with unwashed hands.  . Avoid close contact with people who are sick. . Avoid places or events with large numbers of people in one location, like concerts or sporting events. . Carefully consider travel plans  you have or are making. . If you are planning any travel outside or inside the [place], visit the CDC's Travelers' Health webpage for the latest health notices. . If you have some symptoms but not all symptoms, continue to monitor at home and seek medical attention  . if your symptoms worsen. . If you are having a medical emergency, call 911.   . >>>>>>>>>>>>>>>>>>>>>>>>>>>>>>>>> . We Do NOT Approve of  [facility] [place] Soliciting Our Patients  To Do Home Visits & We Do NOT Approve of [employer] > > > > > > > > > > > > > > > > > > > > > > > > > > > > > > > > > > > > > > >  Preventive Care for Adults  A healthy lifestyle  and preventive care can promote health and wellness. Preventive health guidelines for women include the following key practices.  A routine yearly physical is a good way to check with your health care provider about your health and preventive screening. It is a chance to share any concerns and updates on your health and to receive a thorough exam.  Visit your dentist for a routine exam and preventive care every 6 months. Brush your teeth twice a day and floss once a day. Good oral hygiene prevents tooth decay and gum disease.  The frequency of eye exams is based on your age, health, family medical history, use of contact lenses, and other factors. Follow your health care provider's recommendations for frequency of eye exams.  Eat a healthy diet. Foods like vegetables, fruits, whole grains, low-fat dairy products, and lean protein foods contain the nutrients you need without too many calories. Decrease your intake of foods high in solid fats, added sugars, and salt. Eat the right amount of calories for you. Get information about a proper diet from your health care provider, if necessary.  Regular physical exercise is one of the most important things you can do for your health. Most adults should get at least 150 minutes of moderate-intensity exercise (any activity that increases your heart rate and causes you to sweat) each week. In addition, most adults need muscle-strengthening exercises on 2 or more days a week.  Maintain a healthy weight. The body mass index (BMI) is a screening tool to identify possible weight problems. It provides an estimate of body fat based on height and weight. Your health care provider can find your BMI and can help you achieve or maintain a healthy weight. For adults 20 years and older:  A BMI below 18.5 is considered underweight.  A BMI of 18.5 to 24.9 is normal.  A BMI of 25 to 29.9 is considered overweight.  A BMI of 30 and above is considered obese.  Maintain  normal blood lipids and cholesterol levels by exercising and minimizing your intake of saturated fat. Eat a balanced diet with plenty of fruit and vegetables. If your lipid or cholesterol levels are high, you are over 50, or you are at high risk for heart disease, you may need your cholesterol levels checked more frequently. Ongoing high lipid and cholesterol levels should be treated with medicines if diet and exercise are not working.  If you smoke, find out from your health care provider how to quit. If you do not use tobacco, do not start.  Lung cancer screening is recommended for adults aged 55-80 years who are at high risk for developing lung cancer because of a history   of smoking. A yearly low-dose CT scan of the lungs is recommended for people who have at least a 30-pack-year history of smoking and are a current smoker or have quit within the past 15 years. A pack year of smoking is smoking an average of 1 pack of cigarettes a day for 1 year (for example: 1 pack a day for 30 years or 2 packs a day for 15 years). Yearly screening should continue until the smoker has stopped smoking for at least 15 years. Yearly screening should be stopped for people who develop a health problem that would prevent them from having lung cancer treatment.  Avoid use of street drugs. Do not share needles with anyone. Ask for help if you need support or instructions about stopping the use of drugs.  High blood pressure causes heart disease and increases the risk of stroke.  Ongoing high blood pressure should be treated with medicines if weight loss and exercise do not work.  If you are 27-58 years old, ask your health care provider if you should take aspirin to prevent strokes.  Diabetes screening involves taking a blood sample to check your fasting blood sugar level. This should be done once every 3 years, after age 73, if you are within normal weight and without risk factors for diabetes. Testing should be considered  at a younger age or be carried out more frequently if you are overweight and have at least 1 risk factor for diabetes.  Breast cancer screening is essential preventive care for women. You should practice "breast self-awareness." This means understanding the normal appearance and feel of your breasts and may include breast self-examination. Any changes detected, no matter how small, should be reported to a health care provider. Women in their 43s and 30s should have a clinical breast exam (CBE) by a health care provider as part of a regular health exam every 1 to 3 years. After age 4, women should have a CBE every year. Starting at age 77, women should consider having a mammogram (breast X-ray test) every year. Women who have a family history of breast cancer should talk to their health care provider about genetic screening. Women at a high risk of breast cancer should talk to their health care providers about having an MRI and a mammogram every year.  Breast cancer gene (BRCA)-related cancer risk assessment is recommended for women who have family members with BRCA-related cancers. BRCA-related cancers include breast, ovarian, tubal, and peritoneal cancers. Having family members with these cancers may be associated with an increased risk for harmful changes (mutations) in the breast cancer genes BRCA1 and BRCA2. Results of the assessment will determine the need for genetic counseling and BRCA1 and BRCA2 testing.  Routine pelvic exams to screen for cancer are no longer recommended for nonpregnant women who are considered low risk for cancer of the pelvic organs (ovaries, uterus, and vagina) and who do not have symptoms. Ask your health care provider if a screening pelvic exam is right for you.  If you have had past treatment for cervical cancer or a condition that could lead to cancer, you need Pap tests and screening for cancer for at least 20 years after your treatment. If Pap tests have been discontinued,  your risk factors (such as having a new sexual partner) need to be reassessed to determine if screening should be resumed. Some women have medical problems that increase the chance of getting cervical cancer. In these cases, your health care provider may recommend more  frequent screening and Pap tests.    Colorectal cancer can be detected and often prevented. Most routine colorectal cancer screening begins at the age of 9 years and continues through age 44 years. However, your health care provider may recommend screening at an earlier age if you have risk factors for colon cancer. On a yearly basis, your health care provider may provide home test kits to check for hidden blood in the stool. Use of a small camera at the end of a tube, to directly examine the colon (sigmoidoscopy or colonoscopy), can detect the earliest forms of colorectal cancer. Talk to your health care provider about this at age 74, when routine screening begins.  Direct exam of the colon should be repeated every 5-10 years through age 23 years, unless early forms of pre-cancerous polyps or small growths are found.  Osteoporosis is a disease in which the bones lose minerals and strength with aging. This can result in serious bone fractures or breaks. The risk of osteoporosis can be identified using a bone density scan. Women ages 64 years and over and women at risk for fractures or osteoporosis should discuss screening with their health care providers. Ask your health care provider whether you should take a calcium supplement or vitamin D to reduce the rate of osteoporosis.  Menopause can be associated with physical symptoms and risks. Hormone replacement therapy is available to decrease symptoms and risks. You should talk to your health care provider about whether hormone replacement therapy is right for you.  Use sunscreen. Apply sunscreen liberally and repeatedly throughout the day. You should seek shade when your shadow is shorter  than you. Protect yourself by wearing long sleeves, pants, a wide-brimmed hat, and sunglasses year round, whenever you are outdoors.  Once a month, do a whole body skin exam, using a mirror to look at the skin on your back. Tell your health care provider of new moles, moles that have irregular borders, moles that are larger than a pencil eraser, or moles that have changed in shape or color.  Stay current with required vaccines (immunizations).  Influenza vaccine. All adults should be immunized every year.  Tetanus, diphtheria, and acellular pertussis (Td, Tdap) vaccine. Pregnant women should receive 1 dose of Tdap vaccine during each pregnancy. The dose should be obtained regardless of the length of time since the last dose. Immunization is preferred during the 27th-36th week of gestation. An adult who has not previously received Tdap or who does not know her vaccine status should receive 1 dose of Tdap. This initial dose should be followed by tetanus and diphtheria toxoids (Td) booster doses every 10 years. Adults with an unknown or incomplete history of completing a 3-dose immunization series with Td-containing vaccines should begin or complete a primary immunization series including a Tdap dose. Adults should receive a Td booster every 10 years.    Zoster vaccine. One dose is recommended for adults aged 76 years or older unless certain conditions are present.    Pneumococcal 13-valent conjugate (PCV13) vaccine. When indicated, a person who is uncertain of her immunization history and has no record of immunization should receive the PCV13 vaccine. An adult aged 9 years or older who has certain medical conditions and has not been previously immunized should receive 1 dose of PCV13 vaccine. This PCV13 should be followed with a dose of pneumococcal polysaccharide (PPSV23) vaccine. The PPSV23 vaccine dose should be obtained at least 1 or more year(s) after the dose of PCV13 vaccine. An adult  aged 24  years or older who has certain medical conditions and previously received 1 or more doses of PPSV23 vaccine should receive 1 dose of PCV13. The PCV13 vaccine dose should be obtained 1 or more years after the last PPSV23 vaccine dose.    Pneumococcal polysaccharide (PPSV23) vaccine. When PCV13 is also indicated, PCV13 should be obtained first. All adults aged 74 years and older should be immunized. An adult younger than age 85 years who has certain medical conditions should be immunized. Any person who resides in a nursing home or long-term care facility should be immunized. An adult smoker should be immunized. People with an immunocompromised condition and certain other conditions should receive both PCV13 and PPSV23 vaccines. People with human immunodeficiency virus (HIV) infection should be immunized as soon as possible after diagnosis. Immunization during chemotherapy or radiation therapy should be avoided. Routine use of PPSV23 vaccine is not recommended for American Indians, [place] Natives, or people younger than 65 years unless there are medical conditions that require PPSV23 vaccine. When indicated, people who have unknown immunization and have no record of immunization should receive PPSV23 vaccine. One-time revaccination 5 years after the first dose of PPSV23 is recommended for people aged 19-64 years who have chronic kidney failure, nephrotic syndrome, asplenia, or immunocompromised conditions. People who received 1-2 doses of PPSV23 before age 10 years should receive another dose of PPSV23 vaccine at age 31 years or later if at least 5 years have passed since the previous dose. Doses of PPSV23 are not needed for people immunized with PPSV23 at or after age 76 years.   Preventive Services / Frequency  Ages 30 years and over  Blood pressure check.  Lipid and cholesterol check.  Lung cancer screening. / Every year if you are aged 72-80 years and have a 30-pack-year history of smoking and  currently smoke or have quit within the past 15 years. Yearly screening is stopped once you have quit smoking for at least 15 years or develop a health problem that would prevent you from having lung cancer treatment.  Clinical breast exam.** / Every year after age 25 years.   BRCA-related cancer risk assessment.** / For women who have family members with a BRCA-related cancer (breast, ovarian, tubal, or peritoneal cancers).  Mammogram.** / Every year beginning at age 29 years and continuing for as long as you are in good health. Consult with your health care provider.  Pap test.** / Every 3 years starting at age 79 years through age 74 or 44 years with 3 consecutive normal Pap tests. Testing can be stopped between 65 and 70 years with 3 consecutive normal Pap tests and no abnormal Pap or HPV tests in the past 10 years.  Fecal occult blood test (FOBT) of stool. / Every year beginning at age 43 years and continuing until age 42 years. You may not need to do this test if you get a colonoscopy every 10 years.  Flexible sigmoidoscopy or colonoscopy.** / Every 5 years for a flexible sigmoidoscopy or every 10 years for a colonoscopy beginning at age 34 years and continuing until age 35 years.  Hepatitis C blood test.** / For all people born from [birth year] through 1963 and any individual with known risks for hepatitis C.  Osteoporosis screening.** / A one-time screening for women ages 58 years and over and women at risk for fractures or osteoporosis.  Skin self-exam. / Monthly.  Influenza vaccine. / Every year.  Tetanus, diphtheria, and acellular pertussis (Tdap/Td) vaccine.** /  1 dose of Td every 10 years.  Zoster vaccine.** / 1 dose for adults aged 60 years or older.  Pneumococcal 13-valent conjugate (PCV13) vaccine.** / Consult your health care provider.  Pneumococcal polysaccharide (PPSV23) vaccine.** / 1 dose for all adults aged 65 years and older. Screening for abdominal aortic aneurysm (AAA)   by ultrasound is recommended for people who have history of high blood pressure or who are current or former smokers. ++++++++++++++++++++ Recommend Adult Low Dose Aspirin or  coated  Aspirin 81 mg daily  To reduce risk of Colon Cancer 40 %,  Skin Cancer 26 % ,  Melanoma 46%  and  Pancreatic cancer 60% ++++++++++++++++++++ Vitamin D goal  is between 70-100.  Please make sure that you are taking your Vitamin D as directed.  It is very important as a natural anti-inflammatory  helping hair, skin, and nails, as well as reducing stroke and heart attack risk.  It helps your bones and helps with mood. It also decreases numerous cancer risks so please take it as directed.  Low Vit D is associated with a 200-300% higher risk for CANCER  and 200-300% higher risk for HEART   ATTACK  &  STROKE.   ...................................... It is also associated with higher death rate at younger ages,  autoimmune diseases like Rheumatoid arthritis, Lupus, Multiple Sclerosis.    Also many other serious conditions, like depression, Alzheimer's Dementia, infertility, muscle aches, fatigue, fibromyalgia - just to name a few. ++++++++++++++++++ Recommend the book "The END of DIETING" by Dr Joel Fuhrman  & the book "The END of DIABETES " by Dr Joel Fuhrman At [employer] - get book & Audio CD's    Being diabetic has a  300% increased risk for heart attack, stroke, cancer, and alzheimer- type vascular dementia. It is very important that you work harder with diet by avoiding all foods that are white. Avoid white rice (brown & wild rice is OK), white potatoes (sweetpotatoes in moderation is OK), White bread or wheat bread or anything made out of white flour like bagels, donuts, rolls, buns, biscuits, cakes, pastries, cookies, pizza crust, and pasta (made from white flour & egg whites) - vegetarian pasta or spinach or wheat pasta is OK. Multigrain breads like Arnold's or [employer], or multigrain sandwich thins  or flatbreads.  Diet, exercise and weight loss can reverse and cure diabetes in the early stages.  Diet, exercise and weight loss is very important in the control and prevention of complications of diabetes which affects every system in your body, ie. Brain - dementia/stroke, eyes - glaucoma/blindness, heart - heart attack/heart failure, kidneys - dialysis, stomach - gastric paralysis, intestines - malabsorption, nerves - severe painful neuritis, circulation - gangrene & loss of a leg(s), and finally cancer and Alzheimers.    I recommend avoid fried & greasy foods,  sweets/candy, white rice (brown or wild rice or Quinoa is OK), white potatoes (sweet potatoes are OK) - anything made from white flour - bagels, doughnuts, rolls, buns, biscuits,white and wheat breads, pizza crust and traditional pasta made of white flour & egg white(vegetarian pasta or spinach or wheat pasta is OK).  Multi-grain bread is OK - like multi-grain flat bread or sandwich thins. Avoid alcohol in excess. Exercise is also important.    Eat all the vegetables you want - avoid meat, especially red meat and dairy - especially cheese.  Cheese is the most concentrated form of trans-fats which is the worst thing to clog up our arteries. Veggie   cheese is OK which can be found in the fresh produce section at [employer] or [employer] or [employer]  +++++++++++++++++++ DASH Eating Plan  DASH stands for "Dietary Approaches to Stop Hypertension."   The DASH eating plan is a healthy eating plan that has been shown to reduce high blood pressure (hypertension). Additional health benefits may include reducing the risk of type 2 diabetes mellitus, heart disease, and stroke. The DASH eating plan may also help with weight loss. WHAT DO I NEED TO KNOW ABOUT THE DASH EATING PLAN? For the DASH eating plan, you will follow these general guidelines:  Choose foods with a percent daily value for sodium of less than 5% (as listed on the food  label).  Use salt-free seasonings or herbs instead of table salt or sea salt.  Check with your health care provider or pharmacist before using salt substitutes.  Eat lower-sodium products, often labeled as "lower sodium" or "no salt added."  Eat fresh foods.  Eat more vegetables, fruits, and low-fat dairy products.  Choose whole grains. Look for the word "whole" as the first word in the ingredient list.  Choose fish   Limit sweets, desserts, sugars, and sugary drinks.  Choose heart-healthy fats.  Eat veggie cheese   Eat more home-cooked food and less restaurant, buffet, and fast food.  Limit fried foods.  Cook foods using methods other than frying.  Limit canned vegetables. If you do use them, rinse them well to decrease the sodium.  When eating at a restaurant, ask that your food be prepared with less salt, or no salt if possible.                      WHAT FOODS CAN I EAT? Read Dr Fara Olden Fuhrman's books on The End of Dieting & The End of Diabetes  Grains Whole grain or whole wheat bread. Brown rice. Whole grain or whole wheat pasta. Quinoa, bulgur, and whole grain cereals. Low-sodium cereals. Corn or whole wheat flour tortillas. Whole grain cornbread. Whole grain crackers. Low-sodium crackers.  Vegetables Fresh or frozen vegetables (raw, steamed, roasted, or grilled). Low-sodium or reduced-sodium tomato and vegetable juices. Low-sodium or reduced-sodium tomato sauce and paste. Low-sodium or reduced-sodium canned vegetables.   Fruits All fresh, canned (in natural juice), or frozen fruits.  Protein Products  All fish and seafood.  Dried beans, peas, or lentils. Unsalted nuts and seeds. Unsalted canned beans.  Dairy Low-fat dairy products, such as skim or 1% milk, 2% or reduced-fat cheeses, low-fat ricotta or cottage cheese, or plain low-fat yogurt. Low-sodium or reduced-sodium cheeses.  Fats and Oils Tub margarines without trans fats. Light or reduced-fat mayonnaise  and salad dressings (reduced sodium). Avocado. Safflower, olive, or canola oils. Natural peanut or almond butter.  Other Unsalted popcorn and pretzels. The items listed above may not be a complete list of recommended foods or beverages. Contact your dietitian for more options.  +++++++++++++++  WHAT FOODS ARE NOT RECOMMENDED? Grains/ White flour or wheat flour White bread. White pasta. White rice. Refined cornbread. Bagels and croissants. Crackers that contain trans fat.  Vegetables  Creamed or fried vegetables. Vegetables in a . Regular canned vegetables. Regular canned tomato sauce and paste. Regular tomato and vegetable juices.  Fruits Dried fruits. Canned fruit in light or heavy syrup. Fruit juice.  Meat and Other Protein Products Meat in general - RED meat & White meat.  Fatty cuts of meat. Ribs, chicken wings, all processed meats as bacon, sausage, bologna, salami,  fatback, hot dogs, bratwurst and packaged luncheon meats.  Dairy Whole or 2% milk, cream, half-and-half, and cream cheese. Whole-fat or sweetened yogurt. Full-fat cheeses or blue cheese. Non-dairy creamers and whipped toppings. Processed cheese, cheese spreads, or cheese curds.  Condiments Onion and garlic salt, seasoned salt, table salt, and sea salt. Canned and packaged gravies. Worcestershire sauce. Tartar sauce. Barbecue sauce. Teriyaki sauce. Soy sauce, including reduced sodium. Steak sauce. Fish sauce. Oyster sauce. Cocktail sauce. Horseradish. Ketchup and mustard. Meat flavorings and tenderizers. Bouillon cubes. Hot sauce. Tabasco sauce. Marinades. Taco seasonings. Relishes.  Fats and Oils Butter, stick margarine, lard, shortening and bacon fat. Coconut, palm kernel, or palm oils. Regular salad dressings.  Pickles and olives. Salted popcorn and pretzels.  The items listed above may not be a complete list of foods and beverages to avoid.    

## 2019-01-05 NOTE — Progress Notes (Signed)
Annual Screening/Preventative Visit & Comprehensive Evaluation &  Examination     This very nice 85 y.o.  WWF presents for a Screening /Preventative Visit & comprehensive evaluation and management of multiple medical co-morbidities.  Patient has been followed for HTN, HLD,  Hypothyroidism,  PreDiabetes  and Vitamin D Deficiency.      HTN predates since 7. Patient has hx/o very labile HTN, but currently is controlled on omly Benazepril & only takes her Amlodipine / Atenolol if Sys BP>150.  Patient denies any cardiac symptoms as chest pain, palpitations, shortness of breath, dizziness or ankle swelling.   She also has CKD3 attributed to her HTN.  Today's BP is at goal - 116/64.      Patient's hyperlipidemia is controlled with diet and medications. Patient denies myalgias or other medication SE's. Last lipids were near goal: Lab Results  Component Value Date   CHOL 167 10/06/2018   HDL 44 (L) 10/06/2018   LDLCALC 102 (H) 10/06/2018   TRIG 114 10/06/2018   CHOLHDL 3.8 10/06/2018      Patient has hx/o prediabetes (A1c 5.7% / 2014, then 6.1% in 2016 & 2017, 6.0% in 2017 and 5.8% / 2018)  and patient denies reactive hypoglycemic symptoms, visual blurring, diabetic polys or paresthesias. Last A1c was not at goal: Lab Results  Component Value Date   HGBA1C 6.2 (H) 10/06/2018      Patient has been on Thyroid Replacement since 1993.       Finally, patient has history of Vitamin D Deficiency  ("36" / 2009 and "29" / 2016)   and last Vitamin D was near goal (goal 70-100): Lab Results  Component Value Date   VD25OH 55 10/06/2018   Current Outpatient Medications on File Prior to Visit  Medication Sig  . acetaminophen (TYLENOL) 325 MG tablet Take 325 mg by mouth every 6 (six) hours as needed.  Marland Kitchen amLODipine (NORVASC) 10 MG tablet TAKE 1/2 TO 1 TABLET BY MOUTH DAILY FOR BP  . aspirin 81 MG tablet Take 81 mg by mouth daily.    Marland Kitchen atenolol (TENORMIN) 50 MG tablet Take 1 tablet daily for BP  .  benazepril (LOTENSIN) 40 MG tablet Take 1 tablet (40 mg total) by mouth daily.  . Cholecalciferol (VITAMIN D3) 1000 units CAPS Take 1,000 Units by mouth 2 (two) times daily.  . clorazepate (TRANXENE) 7.5 MG tablet TAKE 1/2 TO 1 (ONE-HALF TO ONE) TABLET BY MOUTH 2 TO 3 TIMES DAILY AS NEEDED FOR ANXIETY  . hyoscyamine (LEVSIN SL) 0.125 MG SL tablet Take 1 to 2 tablets 3 to 4 x /day if needed for Nausea, vomiting, cramping or diarrhea  . levothyroxine (SYNTHROID) 150 MCG tablet Take 1 tablet (150 mcg total) by mouth daily.  . meclizine (ANTIVERT) 25 MG tablet TAKE 1/2 TO 1 (ONE-HALF TO ONE) TABLET BY MOUTH THREE TIMES DAILY FOR  MOTION  SICKNESS/DIZZINESS  . neomycin-colistin-hydrocortisone-thonzonium (CORTISPORIN-TC) 3.08-11-08-0.5 MG/ML OTIC suspension 4-5 drops a day in the affected ear with drainage or pain  . triamcinolone ointment (KENALOG) 0.5 % Apply 1 application topically 2 (two) times daily.   No current facility-administered medications on file prior to visit.    Allergies  Allergen Reactions  . Sulfa Antibiotics Other (See Comments)  . Asa [Aspirin] Other (See Comments)    Take 81mg  cannot take 325mg  gets nervous  . Codeine Nausea Only  . Prednisone Nausea And Vomiting   Past Medical History:  Diagnosis Date  . CKD (chronic kidney disease) stage 3, GFR  30-59 ml/min ([place])   . Colon polyp   . Diverticulosis   . Hypertension   . Iron deficiency   . Prediabetes   . Prediabetes   . Small bowel obstruction [facility])    Health Maintenance  Topic Date Due  . INFLUENZA VACCINE  01/10/2019  . TETANUS/TDAP  10/11/2027  . DEXA SCAN  Completed  . PNA vac Low Risk Adult  Completed   Immunization History  Administered Date(s) Administered  . DT 03/22/2015  . Influenza Split 03/04/2013  . Influenza, High Dose Seasonal PF 03/16/2014, 03/22/2015, 02/15/2016, 04/23/2017, 04/30/2018  . Pneumococcal Polysaccharide-23 03/16/2014  . Td 10/10/2017   Last Colon -  01/09/2011 - Dr Carlean Purl -  Tubovillous adenoma polyp & was recommend 3-6 mo f/u repeat Colonoscopy which patient continues to refuse  Last MGM - 01/25/2017 - patient refuses to schedule f/u MGM  Past Surgical History:  Procedure Laterality Date  . CATARACT EXTRACTION    . EYE SURGERY    . MACULAR TEAR    . Vericose vein  1960   Family History  Problem Relation Age of Onset  . Heart disease Mother   . Stroke Mother   . Heart disease Father   . Colon polyps Sister   . Diabetes Sister   . Stroke Sister   . Heart disease Brother   . Stroke Brother   . Hypertension Son    Social History   Tobacco Use  . Smoking status: Former Smoker    Packs/day: 1.00    Years: 15.00    Pack years: 15.00    Quit date: 06/11/1965    Years since quitting: 53.6  . Smokeless tobacco: Never Used  Substance Use Topics  . Alcohol use: No  . Drug use: No    ROS Constitutional: Denies fever, chills, weight loss/gain, headaches, insomnia,  night sweats, and change in appetite. Does c/o fatigue. Eyes: Denies redness, blurred vision, diplopia, discharge, itchy, watery eyes.  ENT: Denies discharge, congestion, post nasal drip, epistaxis, sore throat, earache, hearing loss, dental pain, Tinnitus, Vertigo, Sinus pain, snoring.  Cardio: Denies chest pain, palpitations, irregular heartbeat, syncope, dyspnea, diaphoresis, orthopnea, PND, claudication, edema Respiratory: denies cough, dyspnea, DOE, pleurisy, hoarseness, laryngitis, wheezing.  Gastrointestinal: Denies dysphagia, heartburn, reflux, water brash, pain, cramps, nausea, vomiting, bloating, diarrhea, constipation, hematemesis, melena, hematochezia, jaundice, hemorrhoids Genitourinary: Denies dysuria, frequency, urgency, nocturia, hesitancy, discharge, hematuria, flank pain Breast: Breast lumps, nipple discharge, bleeding.  Musculoskeletal: Denies arthralgia, myalgia, stiffness, Jt. Swelling, pain, limp, and strain/sprain. Denies falls. Skin: Denies puritis, rash, hives, warts,  acne, eczema, changing in skin lesion Neuro: No weakness, tremor, incoordination, spasms, paresthesia, pain Psychiatric: Denies confusion, memory loss, sensory loss. Denies Depression. Endocrine: Denies change in weight, skin, hair change, nocturia, and paresthesia, diabetic polys, visual blurring, hyper / hypo glycemic episodes.  Heme/Lymph: No excessive bleeding, bruising, enlarged lymph nodes.  Physical Exam  BP 116/64   Pulse 76   Temp (!) 97 F (36.1 C)   Resp 16   Ht 5\' 2"  (1.575 m)   Wt 164 lb 6.4 oz (74.6 kg)   BMI 30.07 kg/m   General Appearance: Well nourished, well groomed and in no apparent distress.  Eyes: PERRLA, EOMs, conjunctiva no swelling or erythema, normal fundi and vessels. Sinuses: No frontal/maxillary tenderness ENT/Mouth: EACs patent / TMs  nl. Nares clear without erythema, swelling, mucoid exudates. Oral hygiene is good. No erythema, swelling, or exudate. Tongue normal, non-obstructing. Tonsils not swollen or erythematous. Hearing normal.  Neck: Supple, thyroid  not palpable. No bruits, nodes or JVD. Respiratory: Respiratory effort normal.  BS equal and clear bilateral without rales, rhonci, wheezing or stridor. Cardio: Heart sounds are normal with regular rate and rhythm and no murmurs, rubs or gallops. Peripheral pulses are normal and equal bilaterally without edema. No aortic or femoral bruits. Chest: symmetric with normal excursions and percussion. Breasts: Symmetric, without lumps, nipple discharge, retractions, or fibrocystic changes.  Abdomen: Flat, soft with bowel sounds active. Nontender, no guarding, rebound, hernias, masses, or organomegaly.  Lymphatics: Non tender without lymphadenopathy.  Musculoskeletal: Full ROM all peripheral extremities, joint stability, 5/5 strength, and normal gait. Skin: Warm and dry without rashes, lesions, cyanosis, clubbing or  ecchymosis.  Neuro: Cranial nerves intact, reflexes equal bilaterally. Normal muscle tone, no  cerebellar symptoms. Sensation intact.  Pysch: Alert and oriented X 3, normal affect, Insight and Judgment appropriate.   Assessment and Plan  1. Annual Preventative Screening Examination  2. Essential hypertension  - EKG 12-Lead - Urinalysis, Routine w reflex microscopic - Microalbumin / creatinine urine ratio - CBC with Differential/Platelet - COMPLETE METABOLIC PANEL WITH GFR - Magnesium - TSH  3. Hyperlipidemia, mixed  - EKG 12-Lead - Lipid panel - TSH  4. Abnormal glucose  - EKG 12-Lead - Hemoglobin A1c - Insulin, random  5. Vitamin D deficiency  - VITAMIN D 25 Hydroxyl  6. Prediabetes  - EKG 12-Lead - Hemoglobin A1c - Insulin, random  7. B12 deficiency  - Vitamin B12  8. CKD (chronic kidney disease) stage 3, GFR 30-59 ml/min ([facility])  - COMPLETE METABOLIC PANEL WITH GFR  9. Hypothyroidism  - TSH  10. Neuritis  - gabapentin (NEURONTIN) 100 MG capsule; Take 1 capsule 3 x  /day for Burning Neuritis Pains of Feet & Legs  Dispense: 270 capsule; Refill: 0  11. Dysuria  - Urine Culture  12. Candidiasis of breast  - fluconazole (DIFLUCAN) 150 MG tablet; Take 1 tablet 2 x /week  if needed for yeast infection  Dispense: 8 tablet; Refill: 3  13. Screening for colorectal cancer  - POC Hemoccult Bld/Stl  14. Screening for ischemic heart disease  - EKG 12-Lead  15. FHx: heart disease  - EKG 12-Lead  16. Former smoker  - EKG 12-Lead  17. Medication management  - Urinalysis, Routine w reflex microscopic - Microalbumin / creatinine urine ratio - Vitamin B12 - CBC with Differential/Platelet - COMPLETE METABOLIC PANEL WITH GFR - Magnesium - Lipid panel - TSH - Hemoglobin A1c - Insulin, random - VITAMIN D 25 Hydroxyl         Patient was counseled in prudent diet to achieve/maintain BMI less than 25 for weight control, BP monitoring, regular exercise and medications. Discussed med's effects and SE's. Screening labs and tests as requested with  regular follow-up as recommended. Over 40 minutes of exam, counseling, chart review and high complex critical decision making was performed.   Kirtland Bouchard, MD

## 2019-01-06 ENCOUNTER — Ambulatory Visit (INDEPENDENT_AMBULATORY_CARE_PROVIDER_SITE_OTHER): Payer: Medicare HMO | Admitting: Internal Medicine

## 2019-01-06 ENCOUNTER — Other Ambulatory Visit: Payer: Self-pay

## 2019-01-06 VITALS — BP 116/64 | HR 76 | Temp 97.0°F | Resp 16 | Ht 62.0 in | Wt 164.4 lb

## 2019-01-06 DIAGNOSIS — E538 Deficiency of other specified B group vitamins: Secondary | ICD-10-CM | POA: Diagnosis not present

## 2019-01-06 DIAGNOSIS — R7303 Prediabetes: Secondary | ICD-10-CM

## 2019-01-06 DIAGNOSIS — E782 Mixed hyperlipidemia: Secondary | ICD-10-CM | POA: Diagnosis not present

## 2019-01-06 DIAGNOSIS — Z136 Encounter for screening for cardiovascular disorders: Secondary | ICD-10-CM | POA: Diagnosis not present

## 2019-01-06 DIAGNOSIS — Z0001 Encounter for general adult medical examination with abnormal findings: Secondary | ICD-10-CM

## 2019-01-06 DIAGNOSIS — Z87891 Personal history of nicotine dependence: Secondary | ICD-10-CM

## 2019-01-06 DIAGNOSIS — Z0000 Encounter for general adult medical examination without abnormal findings: Secondary | ICD-10-CM | POA: Diagnosis not present

## 2019-01-06 DIAGNOSIS — Z1212 Encounter for screening for malignant neoplasm of rectum: Secondary | ICD-10-CM

## 2019-01-06 DIAGNOSIS — B3789 Other sites of candidiasis: Secondary | ICD-10-CM

## 2019-01-06 DIAGNOSIS — E039 Hypothyroidism, unspecified: Secondary | ICD-10-CM

## 2019-01-06 DIAGNOSIS — I10 Essential (primary) hypertension: Secondary | ICD-10-CM

## 2019-01-06 DIAGNOSIS — R300 Dysuria: Secondary | ICD-10-CM

## 2019-01-06 DIAGNOSIS — E559 Vitamin D deficiency, unspecified: Secondary | ICD-10-CM | POA: Diagnosis not present

## 2019-01-06 DIAGNOSIS — Z8249 Family history of ischemic heart disease and other diseases of the circulatory system: Secondary | ICD-10-CM

## 2019-01-06 DIAGNOSIS — Z79899 Other long term (current) drug therapy: Secondary | ICD-10-CM

## 2019-01-06 DIAGNOSIS — M792 Neuralgia and neuritis, unspecified: Secondary | ICD-10-CM

## 2019-01-06 DIAGNOSIS — N183 Chronic kidney disease, stage 3 unspecified: Secondary | ICD-10-CM

## 2019-01-06 DIAGNOSIS — R7309 Other abnormal glucose: Secondary | ICD-10-CM

## 2019-01-06 DIAGNOSIS — Z1211 Encounter for screening for malignant neoplasm of colon: Secondary | ICD-10-CM

## 2019-01-06 MED ORDER — GABAPENTIN 100 MG PO CAPS: ORAL_CAPSULE | ORAL | 0 refills | Status: DC

## 2019-01-06 MED ORDER — FLUCONAZOLE 150 MG PO TABS: ORAL_TABLET | ORAL | 3 refills | Status: DC

## 2019-01-07 ENCOUNTER — Other Ambulatory Visit: Payer: Self-pay | Admitting: Internal Medicine

## 2019-01-07 DIAGNOSIS — D649 Anemia, unspecified: Secondary | ICD-10-CM

## 2019-01-07 LAB — COMPLETE METABOLIC PANEL WITH GFR
AG Ratio: 1.3 (calc) (ref 1.0–2.5)
ALT: 11 U/L (ref 6–29)
AST: 19 U/L (ref 10–35)
Albumin: 3.7 g/dL (ref 3.6–5.1)
Alkaline phosphatase (APISO): 70 U/L (ref 37–153)
BUN/Creatinine Ratio: 21 (calc) (ref 6–22)
BUN: 27 mg/dL — ABNORMAL HIGH (ref 7–25)
CO2: 22 mmol/L (ref 20–32)
Calcium: 8.9 mg/dL (ref 8.6–10.4)
Chloride: 103 mmol/L (ref 98–110)
Creat: 1.28 mg/dL — ABNORMAL HIGH (ref 0.60–0.88)
GFR, Est African American: 44 mL/min/{1.73_m2} — ABNORMAL LOW (ref >=60)
GFR, Est Non African American: 38 mL/min/{1.73_m2} — ABNORMAL LOW (ref >=60)
Globulin: 2.9 g/dL (calc) (ref 1.9–3.7)
Glucose, Bld: 115 mg/dL — ABNORMAL HIGH (ref 65–99)
Potassium: 4.7 mmol/L (ref 3.5–5.3)
Sodium: 134 mmol/L — ABNORMAL LOW (ref 135–146)
Total Bilirubin: 0.3 mg/dL (ref 0.2–1.2)
Total Protein: 6.6 g/dL (ref 6.1–8.1)

## 2019-01-07 LAB — LIPID PANEL
Cholesterol: 127 mg/dL (ref <200)
HDL: 36 mg/dL — ABNORMAL LOW (ref >=50)
LDL Cholesterol (Calc): 70 mg/dL (calc)
Non-HDL Cholesterol (Calc): 91 mg/dL (calc) (ref <130)
Total CHOL/HDL Ratio: 3.5 (calc) (ref <5.0)
Triglycerides: 127 mg/dL (ref <150)

## 2019-01-07 LAB — CBC WITH DIFFERENTIAL/PLATELET
Absolute Monocytes: 631 cells/uL (ref 200–950)
Basophils Absolute: 62 cells/uL (ref 0–200)
Basophils Relative: 0.8 %
Eosinophils Absolute: 162 {cells}/uL (ref 15–500)
Eosinophils Relative: 2.1 %
HCT: 30.5 % — ABNORMAL LOW (ref 35.0–45.0)
Hemoglobin: 9.9 g/dL — ABNORMAL LOW (ref 11.7–15.5)
Lymphs Abs: 1625 cells/uL (ref 850–3900)
MCH: 28 pg (ref 27.0–33.0)
MCHC: 32.5 g/dL (ref 32.0–36.0)
MCV: 86.4 fL (ref 80.0–100.0)
MPV: 11.7 fL (ref 7.5–12.5)
Monocytes Relative: 8.2 %
Neutro Abs: 5221 cells/uL (ref 1500–7800)
Neutrophils Relative %: 67.8 %
Platelets: 270 10*3/uL (ref 140–400)
RBC: 3.53 10*6/uL — ABNORMAL LOW (ref 3.80–5.10)
RDW: 12.8 % (ref 11.0–15.0)
Total Lymphocyte: 21.1 %
WBC: 7.7 10*3/uL (ref 3.8–10.8)

## 2019-01-07 LAB — URINALYSIS, ROUTINE W REFLEX MICROSCOPIC
Bilirubin Urine: NEGATIVE
Glucose, UA: NEGATIVE
Ketones, ur: NEGATIVE
Nitrite: NEGATIVE
Specific Gravity, Urine: 1.014 (ref 1.001–1.03)
pH: <=5 (ref 5.0–8.0)

## 2019-01-07 LAB — INSULIN, RANDOM: Insulin: 28.5 u[IU]/mL — ABNORMAL HIGH

## 2019-01-07 LAB — TSH: TSH: 0.72 mIU/L (ref 0.40–4.50)

## 2019-01-07 LAB — HEMOGLOBIN A1C
Hgb A1c MFr Bld: 6.1 % of total Hgb — ABNORMAL HIGH (ref <5.7)
Mean Plasma Glucose: 128 (calc)
eAG (mmol/L): 7.1 (calc)

## 2019-01-07 LAB — MAGNESIUM: Magnesium: 1.9 mg/dL (ref 1.5–2.5)

## 2019-01-07 LAB — MICROALBUMIN / CREATININE URINE RATIO
Creatinine, Urine: 88 mg/dL (ref 20–275)
Microalb Creat Ratio: 94 mcg/mg creat — ABNORMAL HIGH (ref <30)
Microalb, Ur: 8.3 mg/dL

## 2019-01-07 LAB — VITAMIN B12: Vitamin B-12: 1604 pg/mL — ABNORMAL HIGH (ref 200–1100)

## 2019-01-07 LAB — VITAMIN D 25 HYDROXY (VIT D DEFICIENCY, FRACTURES): Vit D, 25-Hydroxy: 50 ng/mL (ref 30–100)

## 2019-01-08 ENCOUNTER — Other Ambulatory Visit: Payer: Self-pay | Admitting: Internal Medicine

## 2019-01-08 DIAGNOSIS — N3000 Acute cystitis without hematuria: Secondary | ICD-10-CM

## 2019-01-08 LAB — URINE CULTURE
MICRO NUMBER:: 712155
SPECIMEN QUALITY:: ADEQUATE

## 2019-01-08 MED ORDER — AMOXICILLIN 250 MG PO CAPS: ORAL_CAPSULE | ORAL | 0 refills | Status: DC

## 2019-01-20 ENCOUNTER — Telehealth: Payer: Self-pay | Admitting: Hematology and Oncology

## 2019-01-20 NOTE — Telephone Encounter (Signed)
Scheduled appt per 8/06 sch message - pt aware of appt date and time   

## 2019-01-26 ENCOUNTER — Ambulatory Visit: Payer: Self-pay | Admitting: Adult Health

## 2019-01-27 ENCOUNTER — Ambulatory Visit: Payer: Medicare HMO | Admitting: Hematology and Oncology

## 2019-02-02 ENCOUNTER — Other Ambulatory Visit: Payer: Self-pay

## 2019-02-02 ENCOUNTER — Ambulatory Visit
Admission: RE | Admit: 2019-02-02 | Discharge: 2019-02-02 | Disposition: A | Discharge status: Home / Self Care | Payer: Medicare HMO | Source: Ambulatory Visit | Attending: Orthopedic Surgery | Admitting: Orthopedic Surgery

## 2019-02-02 DIAGNOSIS — M545 Low back pain, unspecified: Secondary | ICD-10-CM

## 2019-02-03 ENCOUNTER — Telehealth: Payer: Self-pay | Admitting: *Deleted

## 2019-02-03 NOTE — Assessment & Plan Note (Deleted)
Previous Lab work up:  Iron studies: Iron saturation 20%, ferritin 36 123456 and folic acid normal SPEP: No M protein Serum creatinine: 1.34, BUN 28 Haptoglobin, reticulocyte count, LDH did not show evidence of hemolysis Erythropoietin: 8.7 (inappropriately low) Hemoglobin: 10.5  I discussed with her the results of this test and I provided her with a copy of the result. The diagnosis is anemia of chronic kidney disease stage III.  Lab review: 02/10/2019:

## 2019-02-03 NOTE — Telephone Encounter (Signed)
Patient called and states she is very nervous and her blood pressure was 140/76 at 8 AM and after taking Benazepril 40 mg, it was 160/94 at 10:15 AM.  Patient did not take Amlodipine or Atenolol last PM, due to BP being low.  Per Dr Melford Aase, the patient should take her medications on schedule to avoid elevations.  She states her BP has gone down since our earlier conversation and she feels more calm.

## 2019-02-05 ENCOUNTER — Ambulatory Visit: Payer: Medicare HMO | Admitting: Hematology

## 2019-02-05 ENCOUNTER — Other Ambulatory Visit: Payer: Medicare HMO

## 2019-02-06 ENCOUNTER — Ambulatory Visit: Payer: Medicare HMO | Admitting: Orthopedic Surgery

## 2019-02-09 ENCOUNTER — Other Ambulatory Visit: Payer: Self-pay

## 2019-02-09 ENCOUNTER — Ambulatory Visit (INDEPENDENT_AMBULATORY_CARE_PROVIDER_SITE_OTHER): Payer: Medicare HMO

## 2019-02-09 DIAGNOSIS — N3000 Acute cystitis without hematuria: Secondary | ICD-10-CM | POA: Diagnosis not present

## 2019-02-09 NOTE — Addendum Note (Signed)
Addended by: Eulis Canner on: 02/09/2019 02:53 PM   Modules accepted: Orders

## 2019-02-09 NOTE — Progress Notes (Signed)
Patient presents to the office for a nurse visit. Patient completed antibiotics; amoxicillin for a UTI. Patient no longer having any symptoms. Vitals taken and recorded. No other questions or concerns.

## 2019-02-10 ENCOUNTER — Inpatient Hospital Stay: Payer: Medicare HMO

## 2019-02-10 ENCOUNTER — Telehealth: Payer: Self-pay | Admitting: Hematology and Oncology

## 2019-02-10 ENCOUNTER — Inpatient Hospital Stay: Payer: Medicare HMO | Admitting: Hematology and Oncology

## 2019-02-10 NOTE — Telephone Encounter (Signed)
Confirmed 9/14 appointment with patient. Rescheduled from 9/1 per patient.

## 2019-02-11 LAB — URINALYSIS, ROUTINE W REFLEX MICROSCOPIC
Bacteria, UA: NONE SEEN /HPF
Bilirubin Urine: NEGATIVE
Glucose, UA: NEGATIVE
Hgb urine dipstick: NEGATIVE
Hyaline Cast: NONE SEEN /LPF
Ketones, ur: NEGATIVE
Nitrite: NEGATIVE
Protein, ur: NEGATIVE
RBC / HPF: NONE SEEN /HPF (ref 0–2)
Specific Gravity, Urine: 1.009 (ref 1.001–1.03)
pH: 6 (ref 5.0–8.0)

## 2019-02-11 LAB — URINE CULTURE
MICRO NUMBER:: 830287
Result:: NO GROWTH
SPECIMEN QUALITY:: ADEQUATE

## 2019-02-22 NOTE — Progress Notes (Signed)
Patient Care Team: [name], MD as PCP - General (Internal Medicine)  DIAGNOSIS:    ICD-10-CM   1. Normocytic anemia  D64.9     CHIEF COMPLIANT: Follow-up of anemia and recent blood work  INTERVAL HISTORY: Mikayla Harrison is a 85 y.o. with above-mentioned history of anemia due to chronic kidney disease. Labs on 01/06/19 showed: Hg 9.9, HCT 30.5, MCV 86.4, creatinine 1.28. She presents to the clinic today to review recent labs.  She has been feeling extremely well with exception of neuropathy in the feet.  She was prescribed gabapentin but she has not taken it.  REVIEW OF SYSTEMS:   Constitutional: Denies fevers, chills or abnormal weight loss Eyes: Denies blurriness of vision Ears, nose, mouth, throat, and face: Denies mucositis or sore throat Respiratory: Denies cough, dyspnea or wheezes Cardiovascular: Denies palpitation, chest discomfort Gastrointestinal: Denies nausea, heartburn or change in bowel habits Skin: Denies abnormal skin rashes Lymphatics: Denies new lymphadenopathy or easy bruising Neurological: Peripheral neuropathy in the feet of unclear etiology Behavioral/Psych: Mood is stable, no new changes  Extremities: No lower extremity edema Breast: denies any pain or lumps or nodules in either breasts All other systems were reviewed with the patient and are negative.  I have reviewed the past medical history, past surgical history, social history and family history with the patient and they are unchanged from previous note.  ALLERGIES:  is allergic to sulfa antibiotics; asa [aspirin]; codeine; and prednisone.  MEDICATIONS:  Current Outpatient Medications  Medication Sig Dispense Refill  . acetaminophen (TYLENOL) 325 MG tablet Take 325 mg by mouth every 6 (six) hours as needed.    Marland Kitchen amLODipine (NORVASC) 10 MG tablet TAKE 1/2 TO 1 TABLET BY MOUTH DAILY FOR BP 90 tablet 3  . amoxicillin (AMOXIL) 250 MG capsule Take 1 capsule 3 x /day with meals for infection 30  capsule 0  . aspirin 81 MG tablet Take 81 mg by mouth daily.      Marland Kitchen atenolol (TENORMIN) 50 MG tablet Take 1 tablet daily for BP 90 tablet 3  . benazepril (LOTENSIN) 40 MG tablet Take 1 tablet (40 mg total) by mouth daily. 90 tablet 2  . Cholecalciferol (VITAMIN D3) 1000 units CAPS Take 1,000 Units by mouth 2 (two) times daily.    . clorazepate (TRANXENE) 7.5 MG tablet TAKE 1/2 TO 1 (ONE-HALF TO ONE) TABLET BY MOUTH 2 TO 3 TIMES DAILY AS NEEDED FOR ANXIETY 90 tablet 0  . fluconazole (DIFLUCAN) 150 MG tablet Take 1 tablet 2 x /week  if needed for yeast infection 8 tablet 3  . gabapentin (NEURONTIN) 100 MG capsule Take 1 capsule 3 x  /day for Burning Neuritis Pains of Feet & Legs 270 capsule 0  . hyoscyamine (LEVSIN SL) 0.125 MG SL tablet Take 1 to 2 tablets 3 to 4 x /day if needed for Nausea, vomiting, cramping or diarrhea 30 tablet 0  . levothyroxine (SYNTHROID) 150 MCG tablet Take 1 tablet (150 mcg total) by mouth daily. 90 tablet 1  . meclizine (ANTIVERT) 25 MG tablet TAKE 1/2 TO 1 (ONE-HALF TO ONE) TABLET BY MOUTH THREE TIMES DAILY FOR  MOTION  SICKNESS/DIZZINESS 30 tablet 0  . neomycin-colistin-hydrocortisone-thonzonium (CORTISPORIN-TC) 3.08-11-08-0.5 MG/ML OTIC suspension 4-5 drops a day in the affected ear with drainage or pain 10 mL 0  . triamcinolone ointment (KENALOG) 0.5 % Apply 1 application topically 2 (two) times daily. 30 g 0   No current facility-administered medications for this visit.  PHYSICAL EXAMINATION: ECOG PERFORMANCE STATUS: 1 - Symptomatic but completely ambulatory  Vitals:   02/23/19 1359  BP: 126/61  Pulse: (!) 52  Resp: 16  Temp: 98.7 F (37.1 C)  SpO2: 100%   Filed Weights   02/23/19 1359  Weight: 166 lb 9.6 oz (75.6 kg)    GENERAL: alert, no distress and comfortable SKIN: skin color, texture, turgor are normal, no rashes or significant lesions EYES: normal, Conjunctiva are pink and non-injected, sclera clear OROPHARYNX: no exudate, no erythema and  lips, buccal mucosa, and tongue normal  NECK: supple, thyroid normal size, non-tender, without nodularity LYMPH: no palpable lymphadenopathy in the cervical, axillary or inguinal LUNGS: clear to auscultation and percussion with normal breathing effort HEART: regular rate & rhythm and no murmurs and no lower extremity edema ABDOMEN: abdomen soft, non-tender and normal bowel sounds MUSCULOSKELETAL: no cyanosis of digits and no clubbing  NEURO: alert & oriented x 3 with fluent speech, peripheral neuropathy in the feet EXTREMITIES: No lower extremity edema  LABORATORY DATA:  I have reviewed the data as listed CMP Latest Ref Rng & Units 01/06/2019 11/17/2018 10/06/2018  Glucose 65 - 99 mg/dL 115(H) 92 104(H)  BUN 7 - 25 mg/dL 27(H) 25 30(H)  Creatinine 0.60 - 0.88 mg/dL 1.28(H) 1.16(H) 1.26(H)  Sodium 135 - 146 mmol/L 134(L) 138 137  Potassium 3.5 - 5.3 mmol/L 4.7 4.6 4.4  Chloride 98 - 110 mmol/L 103 104 107  CO2 20 - 32 mmol/L 22 26 25   Calcium 8.6 - 10.4 mg/dL 8.9 9.2 8.6  Total Protein 6.1 - 8.1 g/dL 6.6 6.8 6.3  Total Bilirubin 0.2 - 1.2 mg/dL 0.3 0.4 0.3  Alkaline Phos 38 - 126 U/L - - -  AST 10 - 35 U/L 19 21 15   ALT 6 - 29 U/L 11 12 12     Lab Results  Component Value Date   WBC 7.2 02/23/2019   HGB 10.3 (L) 02/23/2019   HCT 32.0 (L) 02/23/2019   MCV 88.2 02/23/2019   PLT 238 02/23/2019   NEUTROABS 4.5 02/23/2019    ASSESSMENT & PLAN:  Normocytic anemia Lab review:  January 2020:Iron studies: Iron saturation 20%, ferritin 36, 123456 and folic acid normal SPEP: No M protein, Serum creatinine: 1.34, BUN 28, Haptoglobin, reticulocyte count, LDH did not show evidence of hemolysis Erythropoietin: 8.7 (inappropriately low) Hemoglobin: 10.5  02/23/2019: Hemoglobin 10.3, MCV 88.2   The diagnosis is anemia of chronic kidney disease stage III. Since her hemoglobin is more than 10 she does not need immediate erythropoietin replacement therapy. Only if she has persistent low  hemoglobin is below 10 then we will initiate ESA therapy.  Return to clinic in 1 year with review of labs and follow-up.    No orders of the defined types were placed in this encounter.  The patient has a good understanding of the overall plan. she agrees with it. she will call with any problems that may develop before the next visit here.  Nicholas Lose, MD 02/23/2019  Julious Oka Dorshimer am acting as scribe for Dr. Nicholas Lose.  I have reviewed the above documentation for accuracy and completeness, and I agree with the above.

## 2019-02-23 ENCOUNTER — Other Ambulatory Visit: Payer: Self-pay

## 2019-02-23 ENCOUNTER — Inpatient Hospital Stay: Payer: Medicare HMO | Attending: Hematology and Oncology

## 2019-02-23 ENCOUNTER — Inpatient Hospital Stay (HOSPITAL_BASED_OUTPATIENT_CLINIC_OR_DEPARTMENT_OTHER): Payer: Medicare HMO | Admitting: Hematology and Oncology

## 2019-02-23 ENCOUNTER — Other Ambulatory Visit: Payer: Self-pay | Admitting: Internal Medicine

## 2019-02-23 DIAGNOSIS — D631 Anemia in chronic kidney disease: Secondary | ICD-10-CM | POA: Diagnosis not present

## 2019-02-23 DIAGNOSIS — N189 Chronic kidney disease, unspecified: Secondary | ICD-10-CM | POA: Diagnosis not present

## 2019-02-23 DIAGNOSIS — R300 Dysuria: Secondary | ICD-10-CM

## 2019-02-23 DIAGNOSIS — D649 Anemia, unspecified: Secondary | ICD-10-CM | POA: Diagnosis not present

## 2019-02-23 DIAGNOSIS — Z7982 Long term (current) use of aspirin: Secondary | ICD-10-CM | POA: Insufficient documentation

## 2019-02-23 DIAGNOSIS — Z79899 Other long term (current) drug therapy: Secondary | ICD-10-CM | POA: Diagnosis not present

## 2019-02-23 DIAGNOSIS — G629 Polyneuropathy, unspecified: Secondary | ICD-10-CM | POA: Diagnosis not present

## 2019-02-23 LAB — CMP (CANCER CENTER ONLY)
ALT: 11 U/L (ref 0–44)
AST: 21 U/L (ref 15–41)
Albumin: 3.7 g/dL (ref 3.5–5.0)
Alkaline Phosphatase: 80 U/L (ref 38–126)
Anion gap: 7 (ref 5–15)
BUN: 25 mg/dL — ABNORMAL HIGH (ref 8–23)
CO2: 26 mmol/L (ref 22–32)
Calcium: 8.7 mg/dL — ABNORMAL LOW (ref 8.9–10.3)
Chloride: 104 mmol/L (ref 98–111)
Creatinine: 1.47 mg/dL — ABNORMAL HIGH (ref 0.44–1.00)
GFR, Est AFR Am: 38 mL/min — ABNORMAL LOW (ref >60)
GFR, Estimated: 32 mL/min — ABNORMAL LOW (ref >60)
Glucose, Bld: 88 mg/dL (ref 70–99)
Potassium: 4.6 mmol/L (ref 3.5–5.1)
Sodium: 137 mmol/L (ref 135–145)
Total Bilirubin: 0.3 mg/dL (ref 0.3–1.2)
Total Protein: 6.5 g/dL (ref 6.5–8.1)

## 2019-02-23 LAB — CBC WITH DIFFERENTIAL (CANCER CENTER ONLY)
Abs Immature Granulocytes: 0.02 10*3/uL (ref 0.00–0.07)
Basophils Absolute: 0 10*3/uL (ref 0.0–0.1)
Basophils Relative: 1 %
Eosinophils Absolute: 0.2 10*3/uL (ref 0.0–0.5)
Eosinophils Relative: 2 %
HCT: 32 % — ABNORMAL LOW (ref 36.0–46.0)
Hemoglobin: 10.3 g/dL — ABNORMAL LOW (ref 12.0–15.0)
Immature Granulocytes: 0 %
Lymphocytes Relative: 25 %
Lymphs Abs: 1.8 10*3/uL (ref 0.7–4.0)
MCH: 28.4 pg (ref 26.0–34.0)
MCHC: 32.2 g/dL (ref 30.0–36.0)
MCV: 88.2 fL (ref 80.0–100.0)
Monocytes Absolute: 0.7 10*3/uL (ref 0.1–1.0)
Monocytes Relative: 10 %
Neutro Abs: 4.5 10*3/uL (ref 1.7–7.7)
Neutrophils Relative %: 62 %
Platelet Count: 238 10*3/uL (ref 150–400)
RBC: 3.63 MIL/uL — ABNORMAL LOW (ref 3.87–5.11)
RDW: 13.2 % (ref 11.5–15.5)
WBC Count: 7.2 10*3/uL (ref 4.0–10.5)
nRBC: 0 % (ref 0.0–0.2)

## 2019-02-23 LAB — RETICULOCYTES
Immature Retic Fract: 3.5 % (ref 2.3–15.9)
RBC.: 3.63 MIL/uL — ABNORMAL LOW (ref 3.87–5.11)
Retic Count, Absolute: 38.1 10*3/uL (ref 19.0–186.0)
Retic Ct Pct: 1.1 % (ref 0.4–3.1)

## 2019-02-23 NOTE — Assessment & Plan Note (Signed)
Lab review:  January 2020:Iron studies: Iron saturation 20%, ferritin 36, 123456 and folic acid normal SPEP: No M protein, Serum creatinine: 1.34, BUN 28, Haptoglobin, reticulocyte count, LDH did not show evidence of hemolysis Erythropoietin: 8.7 (inappropriately low) Hemoglobin: 10.5  02/23/2019:   The diagnosis is anemia of chronic kidney disease stage III. Since her hemoglobin is more than 10 she does not need immediate erythropoietin replacement therapy. Only if she has persistent low hemoglobin is below 10 then we will initiate ESA therapy.  Return to clinic in 6 months with review of labs and follow-up.

## 2019-02-24 ENCOUNTER — Telehealth: Payer: Self-pay | Admitting: Hematology and Oncology

## 2019-02-24 NOTE — Telephone Encounter (Signed)
I talk with patient regarding schedule  

## 2019-02-25 ENCOUNTER — Ambulatory Visit: Payer: Medicare HMO

## 2019-03-19 ENCOUNTER — Ambulatory Visit: Payer: Medicare HMO | Admitting: Cardiovascular Disease

## 2019-04-02 NOTE — Progress Notes (Signed)
3 MONTH FOLLOW UP  Assessment:     Essential hypertension - continue medications, DASH diet, exercise and monitor at home. Call if greater than 130/80.  - CBC with Differential/Platelet   Prediabetes Discussed disease and risks Discussed diet/exercise, weight management  A1C  CKD Stage IIIb (GFR 32 ml/min) Increase fluids, avoid NSAIDS, monitor sugars, will monitor Refer to nephrology if progressed to stage 4 - CMP/GFR  Hyperlipidemia At goal without medications at this time;  Continue low cholesterol diet and exercise.  Defer lipid panel as would not aggressively treat secondary to age  Medication management - Magnesium   Vitamin D deficiency Continue supplement  Hypothyroidism, unspecified hypothyroidism type -check TSH level, continue medications the same, reminded to take on an empty stomach 30-87mins before food.  - she feels generic not as effective, will check levels, if not at goal will try switching back to synthroid  Overweight Long discussion about weight loss, diet, and exercise Recommended diet heavy in fruits and veggies and low in animal meats, cheeses, and dairy products, appropriate calorie intake Discussed appropriate weight for height Follow up at next visit  Crest syndrome [facility]) Per patient no follow up recommended by Dr. Dossie Der; monitor.   Edema bil lower extremities Has appointment with Dr. Claiborne Billings next week - elevate legs TID, increase activity, increase water, decrease sodium intake.  Wear compression socks more routinely if available. Return to the office if no change with symptoms. Check labs.  Myalgia/ paresthesias/ hx of Crest syndrome Ongoing myalgias limiting significant ambulation; has seen rheumatology due to + ANA 1:320, apparently with unremarkable workup by them and recommended ortho. Dr. Marlou Sa ordered MRI to r/o spinal stenosis which is pending pt to schedule.  Encouraged to call back to do so.    Over 30 minutes of exam,  counseling, chart review, and critical decision making was performed Future Appointments  Date Time Provider [place]  04/28/2019  2:40 PM [name] Sine, MD [facility]  07/13/2019  2:30 PM [name], MD [facility] None  10/15/2019 11:15 AM Vicie Mutters, PA-C [facility] None  02/09/2020  3:00 PM [name], MD [facility] None  02/23/2020  1:15 PM [facility] LAB 2 [facility] None  02/23/2020  1:45 PM Nicholas Lose, MD [facility] None     Subjective:   Mikayla Harrison is a 85 y.o. female who presents for 3 month follow up on hypertension, prediabetes, hyperlipidemia, vitamin D def.   Patient was held overnight for observation in May 2018 with a (-) Head CT, MRI & carotid dopplers and she was discharged home on LD bASA for suspected possible TIA.  She has reported a long history persistent myalgias, soreness and aching in thighs, and tingling in feet; autoimmune workup including +ANA (1:320 in 2017), ? hx of CREST syndrome and was evlauated by Dr. Dossie Der upstairs who ordered AVISE CTD with prognostic and CK, recommended pulmonary function testing but patient declined due to lack of sx. No access to workup results. Per patient "she couldn't find anything wrong" and was referred to [facility] ortho, ordered lumbar MRI due to concern for possible spinal stenosis but hasn't scheduled due to claustrophobia. She declined gabapentin for pain citing SE, "I would rather live with it." She states she did tolerate MRI previously at [facility]; encouraged her to call Dr. Marlou Sa to reschedule at this location.   She lives by herself, has a lot of anxiety at night, has clorazepate if needed, takes 1/2-1/4 tab occasionally, every 2-3 weeks or so.  BMI is Body mass index is 30.8 kg/m., she has been working on diet and exercise, eating more vegetables, some stretching and strength exercises.  Wt Readings from Last 3 Encounters:  04/08/19 168 lb 6.4 oz (76.4 kg)  02/23/19  166 lb 9.6 oz (75.6 kg)  02/09/19 167 lb (75.8 kg)   She is followed by Dr. Claiborne Billings Her blood pressure has been controlled at home, today their BP is BP: 124/70.  She does workout. She denies chest pain, shortness of breath, dizziness.   She is not on cholesterol medication and denies myalgias. Her cholesterol is at goal. The cholesterol last visit was:   Lab Results  Component Value Date   CHOL 127 01/06/2019   HDL 36 (L) 01/06/2019   LDLCALC 70 01/06/2019   TRIG 127 01/06/2019   CHOLHDL 3.5 01/06/2019    She has CKD stage III due to HTN. Lab Results  Component Value Date   GFRNONAA 32 (L) 02/23/2019   She has been working on diet and exercise for prediabetes, and denies paresthesia of the feet, polydipsia, polyuria and visual disturbances. She is on bASA.  Last A1C in the office was:  Lab Results  Component Value Date   HGBA1C 6.1 (H) 01/06/2019   Patient is on Vitamin D supplement. Lab Results  Component Value Date   VD25OH 72 01/06/2019     She is on thyroid medication. Her medication was not changed last visit.  She reports was given generic synthroid at last check Lab Results  Component Value Date   TSH 0.72 01/06/2019   She has chronic anemia felt to be related to chronic disease/progressive CKD and is followed by Dr. Lindi Adie Lab Results  Component Value Date   WBC 7.2 02/23/2019   HGB 10.3 (L) 02/23/2019   HCT 32.0 (L) 02/23/2019   MCV 88.2 02/23/2019   PLT 238 02/23/2019     Medication Review Current Outpatient Medications on File Prior to Visit  Medication Sig  . acetaminophen (TYLENOL) 325 MG tablet Take 325 mg by mouth every 6 (six) hours as needed.  Marland Kitchen amLODipine (NORVASC) 10 MG tablet TAKE 1/2 TO 1 TABLET BY MOUTH DAILY FOR BP  . aspirin 81 MG tablet Take 81 mg by mouth daily.    Marland Kitchen atenolol (TENORMIN) 50 MG tablet Take 1 tablet daily for BP  . benazepril (LOTENSIN) 40 MG tablet Take 1 tablet (40 mg total) by mouth daily.  . Cholecalciferol (VITAMIN D3)  1000 units CAPS Take 1,000 Units by mouth 2 (two) times daily.  . clorazepate (TRANXENE) 7.5 MG tablet TAKE 1/2 TO 1 (ONE-HALF TO ONE) TABLET BY MOUTH 2 TO 3 TIMES DAILY AS NEEDED FOR ANXIETY  . hyoscyamine (LEVSIN SL) 0.125 MG SL tablet Take 1 to 2 tablets 3 to 4 x /day if needed for Nausea, vomiting, cramping or diarrhea  . levothyroxine (SYNTHROID) 150 MCG tablet Take 1 tablet (150 mcg total) by mouth daily.  . meclizine (ANTIVERT) 25 MG tablet TAKE 1/2 TO 1 (ONE-HALF TO ONE) TABLET BY MOUTH THREE TIMES DAILY FOR  MOTION  SICKNESS/DIZZINESS  . neomycin-colistin-hydrocortisone-thonzonium (CORTISPORIN-TC) 3.08-11-08-0.5 MG/ML OTIC suspension 4-5 drops a day in the affected ear with drainage or pain  . triamcinolone ointment (KENALOG) 0.5 % Apply 1 application topically 2 (two) times daily.   No current facility-administered medications on file prior to visit.     Current Problems (verified) Patient Active Problem List   Diagnosis Date Noted  . Enrolled in chronic care management 11/14/2018  .  Normocytic anemia 06/10/2018  . CREST syndrome ([place]) 02/12/2018  . Abnormal glucose 10/10/2017  . FHx: heart disease 10/10/2017  . History of TIA (transient ischemic attack) 11/06/2016  . Raynaud phenomenon 08/01/2015  . Overweight (BMI 25.0-29.9) 03/22/2015  . 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 CKD (chronic kidney disease) stage 3, GFR 30-59 ml/min (HCC) 03/16/2014  . Hyperlipidemia, mixed 10/05/2013  . Screening for colorectal cancer 10/05/2013  . Vitamin D deficiency 10/05/2013  . Prediabetes   . Diverticulosis   . Essential hypertension 01/14/2012  . Hypothyroidism 01/14/2012    Allergies Allergies  Allergen Reactions  . Sulfa Antibiotics Other (See Comments)  . Asa [Aspirin] Other (See Comments)    Take 81mg  cannot take 325mg  gets nervous  . Codeine Nausea Only  . Prednisone Nausea And Vomiting    SURGICAL HISTORY She  has a past surgical history that includes Cataract extraction;  Vericose vein (1960); MACULAR TEAR; and Eye surgery. FAMILY HISTORY Her family history includes Colon polyps in her sister; Diabetes in her sister; Heart disease in her brother, father, and mother; Hypertension in her son; Stroke in her brother, mother, and sister. SOCIAL HISTORY She  reports that she quit smoking about 53 years ago. She has a 15.00 pack-year smoking history. She has never used smokeless tobacco. She reports that she does not drink alcohol or use drugs.   Review of Systems  Constitutional: Negative for chills, fever, malaise/fatigue and weight loss.  HENT: Negative for hearing loss, sore throat and tinnitus.   Eyes: Negative for blurred vision and double vision.  Respiratory: Negative for cough, shortness of breath and wheezing.   Cardiovascular: Positive for leg swelling. Negative for chest pain, palpitations, orthopnea and claudication.  Gastrointestinal: Negative for abdominal pain, blood in stool, constipation, diarrhea, heartburn, melena, nausea and vomiting.  Genitourinary: Negative.   Musculoskeletal: Positive for back pain and myalgias. Negative for falls and joint pain.  Skin: Negative for rash.  Neurological: Positive for tingling (feet, intermittent). Negative for dizziness, sensory change, weakness and headaches.  Endo/Heme/Allergies: Negative for polydipsia.  Psychiatric/Behavioral: Negative.   All other systems reviewed and are negative.    Objective:   Blood pressure 124/70, pulse 69, temperature (!) 97.3 F (36.3 C), height 5\' 2"  (1.575 m), weight 168 lb 6.4 oz (76.4 kg), SpO2 97 %. Body mass index is 30.8 kg/m.  General appearance: alert, no distress, WD/WN,  female HEENT: normocephalic, sclerae anicteric, TMs pearly, nares patent, no discharge or erythema, pharynx normal Oral cavity: MMM, no lesions Neck: supple, no lymphadenopathy, no thyromegaly, no masses Heart: RRR, normal S1, S2, no murmurs Lungs: CTA bilaterally, no wheezes, rhonchi, or  rales Abdomen: +bs, soft, non tender, non distended, no masses, no hepatomegaly, no splenomegaly Musculoskeletal: nontender, no swelling, no obvious deformity Extremities:  no cyanosis, no clubbing; she has 2+ edema to feet, ankles, lower legs bilaterally Pulses: 2+ symmetric, upper and lower extremities, normal cap refill Neurological: alert, oriented x 3, CN2-12 intact, strength normal upper extremities and lower extremities, sensation normal throughout, DTRs 2+ throughout, no cerebellar signs, gait normal Psychiatric: normal affect, behavior normal, pleasant     Izora Ribas, NP   04/08/2019

## 2019-04-06 ENCOUNTER — Other Ambulatory Visit: Payer: Self-pay

## 2019-04-06 DIAGNOSIS — Z1211 Encounter for screening for malignant neoplasm of colon: Secondary | ICD-10-CM

## 2019-04-06 DIAGNOSIS — Z1212 Encounter for screening for malignant neoplasm of rectum: Secondary | ICD-10-CM

## 2019-04-06 LAB — POC HEMOCCULT BLD/STL (HOME/3-CARD/SCREEN)
Card #2 Fecal Occult Blod, POC: NEGATIVE
Card #3 Fecal Occult Blood, POC: NEGATIVE
Fecal Occult Blood, POC: NEGATIVE

## 2019-04-07 DIAGNOSIS — Z1212 Encounter for screening for malignant neoplasm of rectum: Secondary | ICD-10-CM | POA: Diagnosis not present

## 2019-04-08 ENCOUNTER — Encounter: Payer: Self-pay | Admitting: Adult Health

## 2019-04-08 ENCOUNTER — Ambulatory Visit (INDEPENDENT_AMBULATORY_CARE_PROVIDER_SITE_OTHER): Payer: Medicare HMO | Admitting: Adult Health

## 2019-04-08 ENCOUNTER — Other Ambulatory Visit: Payer: Self-pay

## 2019-04-08 VITALS — BP 124/70 | HR 69 | Temp 97.3°F | Ht 62.0 in | Wt 168.4 lb

## 2019-04-08 DIAGNOSIS — N1832 Chronic kidney disease, stage 3b: Secondary | ICD-10-CM

## 2019-04-08 DIAGNOSIS — Z23 Encounter for immunization: Secondary | ICD-10-CM

## 2019-04-08 DIAGNOSIS — R7309 Other abnormal glucose: Secondary | ICD-10-CM

## 2019-04-08 DIAGNOSIS — R7303 Prediabetes: Secondary | ICD-10-CM

## 2019-04-08 DIAGNOSIS — E663 Overweight: Secondary | ICD-10-CM

## 2019-04-08 DIAGNOSIS — M341 CR(E)ST syndrome: Secondary | ICD-10-CM | POA: Diagnosis not present

## 2019-04-08 DIAGNOSIS — I10 Essential (primary) hypertension: Secondary | ICD-10-CM

## 2019-04-08 DIAGNOSIS — E782 Mixed hyperlipidemia: Secondary | ICD-10-CM | POA: Diagnosis not present

## 2019-04-08 DIAGNOSIS — E039 Hypothyroidism, unspecified: Secondary | ICD-10-CM | POA: Diagnosis not present

## 2019-04-08 DIAGNOSIS — D649 Anemia, unspecified: Secondary | ICD-10-CM | POA: Diagnosis not present

## 2019-04-08 DIAGNOSIS — E559 Vitamin D deficiency, unspecified: Secondary | ICD-10-CM

## 2019-04-08 MED ORDER — LEVOTHYROXINE SODIUM 150 MCG PO TABS: 150.0000 ug | ORAL_TABLET | Freq: Every day | ORAL | 1 refills | Status: DC

## 2019-04-08 NOTE — Patient Instructions (Addendum)
CREST syndrome   Try asking for Dr. Ninfa Linden at the ortho office  Try calling back to Dr. Randel Pigg office to have them schedule the MRI at the place you preciously tolerated     Neuropathic Pain Neuropathic pain is pain caused by damage to the nerves that are responsible for certain sensations in your body (sensory nerves). The pain can be caused by:  Damage to the sensory nerves that send signals to your spinal cord and brain (peripheral nervous system).  Damage to the sensory nerves in your brain or spinal cord (central nervous system). Neuropathic pain can make you more sensitive to pain. Even a minor sensation can feel very painful. This is usually a long-term condition that can be difficult to treat. The type of pain differs from person to person. It may:  Start suddenly (acute), or it may develop slowly and last for a long time (chronic).  Come and go as damaged nerves heal, or it may stay at the same level for years.  Cause emotional distress, loss of sleep, and a lower quality of life. What are the causes? The most common cause of this condition is diabetes. Many other diseases and conditions can also cause neuropathic pain. Causes of neuropathic pain can be classified as:  Toxic. This is caused by medicines and chemicals. The most common cause of toxic neuropathic pain is damage from cancer treatments (chemotherapy).  Metabolic. This can be caused by: ? Diabetes. This is the most common disease that damages the nerves. ? Lack of vitamin B from long-term alcohol abuse.  Traumatic. Any injury that cuts, crushes, or stretches a nerve can cause damage and pain. A common example is feeling pain after losing an arm or leg (phantom limb pain).  Compression-related. If a sensory nerve gets trapped or compressed for a long period of time, the blood supply to the nerve can be cut off.  Vascular. Many blood vessel diseases can cause neuropathic pain by decreasing blood supply and  oxygen to nerves.  Autoimmune. This type of pain results from diseases in which the body's defense system (immune system) mistakenly attacks sensory nerves. Examples of autoimmune diseases that can cause neuropathic pain include lupus and multiple sclerosis.  Infectious. Many types of viral infections can damage sensory nerves and cause pain. Shingles infection is a common cause of this type of pain.  Inherited. Neuropathic pain can be a symptom of many diseases that are passed down through families (genetic). What increases the risk? You are more likely to develop this condition if:  You have diabetes.  You smoke.  You drink too much alcohol.  You are taking certain medicines, including medicines that kill cancer cells (chemotherapy) or that treat immune system disorders. What are the signs or symptoms? The main symptom is pain. Neuropathic pain is often described as:  Burning.  Shock-like.  Stinging.  Hot or cold.  Itching. How is this diagnosed? No single test can diagnose neuropathic pain. It is diagnosed based on:  Physical exam and your symptoms. Your health care provider will ask you about your pain. You may be asked to use a pain scale to describe how bad your pain is.  Tests. These may be done to see if you have a high sensitivity to pain and to help find the cause and location of any sensory nerve damage. They include: ? Nerve conduction studies to test how well nerve signals travel through your sensory nerves (electrodiagnostic testing). ? Stimulating your sensory nerves through electrodes  on your skin and measuring the response in your spinal cord and brain (somatosensory evoked potential).  Imaging studies, such as: ? X-rays. ? CT scan. ? MRI. How is this treated? Treatment for neuropathic pain may change over time. You may need to try different treatment options or a combination of treatments. Some options include:  Treating the underlying cause of the  neuropathy, such as diabetes, kidney disease, or vitamin deficiencies.  Stopping medicines that can cause neuropathy, such as chemotherapy.  Medicine to relieve pain. Medicines may include: ? Prescription or over-the-counter pain medicine. ? Anti-seizure medicine. ? Antidepressant medicines. ? Pain-relieving patches that are applied to painful areas of skin. ? A medicine to numb the area (local anesthetic), which can be injected as a nerve block.  Transcutaneous nerve stimulation. This uses electrical currents to block painful nerve signals. The treatment is painless.  Alternative treatments, such as: ? Acupuncture. ? Meditation. ? Massage. ? Physical therapy. ? Pain management programs. ? Counseling. Follow these instructions at home: Medicines   Take over-the-counter and prescription medicines only as told by your health care provider.  Do not drive or use heavy machinery while taking prescription pain medicine.  If you are taking prescription pain medicine, take actions to prevent or treat constipation. Your health care provider may recommend that you: ? Drink enough fluid to keep your urine pale yellow. ? Eat foods that are high in fiber, such as fresh fruits and vegetables, whole grains, and beans. ? Limit foods that are high in fat and processed sugars, such as fried or sweet foods. ? Take an over-the-counter or prescription medicine for constipation. Lifestyle   Have a good support system at home.  Consider joining a chronic pain support group.  Do not use any products that contain nicotine or tobacco, such as cigarettes and e-cigarettes. If you need help quitting, ask your health care provider.  Do not drink alcohol. General instructions  Learn as much as you can about your condition.  Work closely with all your health care providers to find the treatment plan that works best for you.  Ask your health care provider what activities are safe for you.  Keep all  follow-up visits as told by your health care provider. This is important. Contact a health care provider if:  Your pain treatments are not working.  You are having side effects from your medicines.  You are struggling with tiredness (fatigue), mood changes, depression, or anxiety. Summary  Neuropathic pain is pain caused by damage to the nerves that are responsible for certain sensations in your body (sensory nerves).  Neuropathic pain may come and go as damaged nerves heal, or it may stay at the same level for years.  Neuropathic pain is usually a long-term condition that can be difficult to treat. Consider joining a chronic pain support group. This information is not intended to replace advice given to you by your health care provider. Make sure you discuss any questions you have with your health care provider. Document Released: 02/23/2004 Document Revised: 09/18/2018 Document Reviewed: 06/14/2017 Elsevier Patient Education  2020 [employer].

## 2019-04-09 LAB — COMPLETE METABOLIC PANEL WITH GFR
AG Ratio: 1.5 (calc) (ref 1.0–2.5)
ALT: 13 U/L (ref 6–29)
AST: 20 U/L (ref 10–35)
Albumin: 4.1 g/dL (ref 3.6–5.1)
Alkaline phosphatase (APISO): 69 U/L (ref 37–153)
BUN/Creatinine Ratio: 20 (calc) (ref 6–22)
BUN: 23 mg/dL (ref 7–25)
CO2: 26 mmol/L (ref 20–32)
Calcium: 9.2 mg/dL (ref 8.6–10.4)
Chloride: 102 mmol/L (ref 98–110)
Creat: 1.16 mg/dL — ABNORMAL HIGH (ref 0.60–0.88)
GFR, Est African American: 50 mL/min/{1.73_m2} — ABNORMAL LOW (ref >=60)
GFR, Est Non African American: 43 mL/min/{1.73_m2} — ABNORMAL LOW (ref >=60)
Globulin: 2.8 g/dL (calc) (ref 1.9–3.7)
Glucose, Bld: 92 mg/dL (ref 65–99)
Potassium: 4.4 mmol/L (ref 3.5–5.3)
Sodium: 137 mmol/L (ref 135–146)
Total Bilirubin: 0.3 mg/dL (ref 0.2–1.2)
Total Protein: 6.9 g/dL (ref 6.1–8.1)

## 2019-04-09 LAB — CBC WITH DIFFERENTIAL/PLATELET
Absolute Monocytes: 651 cells/uL (ref 200–950)
Basophils Absolute: 59 cells/uL (ref 0–200)
Basophils Relative: 0.8 %
Eosinophils Absolute: 178 cells/uL (ref 15–500)
Eosinophils Relative: 2.4 %
HCT: 33.8 % — ABNORMAL LOW (ref 35.0–45.0)
Hemoglobin: 10.8 g/dL — ABNORMAL LOW (ref 11.7–15.5)
Lymphs Abs: 1880 cells/uL (ref 850–3900)
MCH: 27.7 pg (ref 27.0–33.0)
MCHC: 32 g/dL (ref 32.0–36.0)
MCV: 86.7 fL (ref 80.0–100.0)
MPV: 12.1 fL (ref 7.5–12.5)
Monocytes Relative: 8.8 %
Neutro Abs: 4632 cells/uL (ref 1500–7800)
Neutrophils Relative %: 62.6 %
Platelets: 249 10*3/uL (ref 140–400)
RBC: 3.9 10*6/uL (ref 3.80–5.10)
RDW: 12.9 % (ref 11.0–15.0)
Total Lymphocyte: 25.4 %
WBC: 7.4 10*3/uL (ref 3.8–10.8)

## 2019-04-09 LAB — LIPID PANEL
Cholesterol: 148 mg/dL (ref <200)
HDL: 37 mg/dL — ABNORMAL LOW (ref >=50)
LDL Cholesterol (Calc): 86 mg/dL (calc)
Non-HDL Cholesterol (Calc): 111 mg/dL (calc) (ref <130)
Total CHOL/HDL Ratio: 4 (calc) (ref <5.0)
Triglycerides: 151 mg/dL — ABNORMAL HIGH (ref <150)

## 2019-04-09 LAB — MAGNESIUM: Magnesium: 2 mg/dL (ref 1.5–2.5)

## 2019-04-09 LAB — HEMOGLOBIN A1C
Hgb A1c MFr Bld: 5.8 % of total Hgb — ABNORMAL HIGH (ref <5.7)
Mean Plasma Glucose: 120 (calc)
eAG (mmol/L): 6.6 (calc)

## 2019-04-09 LAB — TSH: TSH: 0.68 mIU/L (ref 0.40–4.50)

## 2019-04-28 ENCOUNTER — Ambulatory Visit: Payer: Medicare HMO | Admitting: Cardiovascular Disease

## 2019-05-19 ENCOUNTER — Ambulatory Visit: Payer: Medicare HMO | Admitting: Physician Assistant

## 2019-06-01 ENCOUNTER — Other Ambulatory Visit: Payer: Self-pay | Admitting: Internal Medicine

## 2019-06-18 ENCOUNTER — Ambulatory Visit: Payer: Medicare HMO | Admitting: Cardiovascular Disease

## 2019-06-19 ENCOUNTER — Ambulatory Visit: Payer: Medicare HMO | Admitting: Podiatry

## 2019-06-22 ENCOUNTER — Telehealth: Payer: Self-pay | Admitting: *Deleted

## 2019-06-22 ENCOUNTER — Ambulatory Visit: Payer: Medicare HMO | Admitting: Podiatry

## 2019-06-22 ENCOUNTER — Telehealth: Payer: Self-pay | Admitting: Podiatry

## 2019-06-22 ENCOUNTER — Ambulatory Visit (INDEPENDENT_AMBULATORY_CARE_PROVIDER_SITE_OTHER): Payer: Medicare HMO

## 2019-06-22 ENCOUNTER — Other Ambulatory Visit: Payer: Self-pay

## 2019-06-22 VITALS — BP 119/59 | HR 54 | Temp 97.5°F

## 2019-06-22 DIAGNOSIS — M79672 Pain in left foot: Secondary | ICD-10-CM

## 2019-06-22 DIAGNOSIS — R0989 Other specified symptoms and signs involving the circulatory and respiratory systems: Secondary | ICD-10-CM

## 2019-06-22 DIAGNOSIS — G629 Polyneuropathy, unspecified: Secondary | ICD-10-CM

## 2019-06-22 DIAGNOSIS — M722 Plantar fascial fibromatosis: Secondary | ICD-10-CM | POA: Diagnosis not present

## 2019-06-22 MED ORDER — DICLOFENAC SODIUM 1 % EX GEL: 2.0000 g | Freq: Two times a day (BID) | CUTANEOUS | 0 refills | Status: DC

## 2019-06-22 NOTE — Patient Instructions (Signed)

## 2019-06-22 NOTE — Telephone Encounter (Signed)
Sent voltaren

## 2019-06-22 NOTE — Telephone Encounter (Signed)
Pt states she was seen by Dr. Jacqualyn Posey today and was to have a medication emailed to [facility] on [address].

## 2019-06-22 NOTE — Telephone Encounter (Signed)
Pt called to say was seen in office 06/22/2019 and Dr.Wagoner was to call in a medication to her pharmacy [facility] [address]. Wanted to know if he was still gonna send in the medication for anti inflammatory

## 2019-06-22 NOTE — Telephone Encounter (Signed)
Completed in a different telephone message.

## 2019-06-22 NOTE — Telephone Encounter (Signed)
Left message informing pt the medication had been sent to the [facility] on [address].

## 2019-06-23 ENCOUNTER — Telehealth: Payer: Self-pay | Admitting: *Deleted

## 2019-06-23 ENCOUNTER — Other Ambulatory Visit: Payer: Self-pay | Admitting: Podiatry

## 2019-06-23 DIAGNOSIS — I739 Peripheral vascular disease, unspecified: Secondary | ICD-10-CM

## 2019-06-23 DIAGNOSIS — M79672 Pain in left foot: Secondary | ICD-10-CM

## 2019-06-23 DIAGNOSIS — R0989 Other specified symptoms and signs involving the circulatory and respiratory systems: Secondary | ICD-10-CM

## 2019-06-23 DIAGNOSIS — M722 Plantar fascial fibromatosis: Secondary | ICD-10-CM

## 2019-06-23 NOTE — Telephone Encounter (Signed)
I told pt that the Voltaren gel could be purchased Over the counter.

## 2019-06-23 NOTE — Telephone Encounter (Signed)
-----   Message from Trula Slade, DPM sent at 06/22/2019 11:05 AM EST ----- Can you please order arterial studie? thanks

## 2019-06-23 NOTE — Telephone Encounter (Signed)
Faxed orders to [facility]. 

## 2019-06-23 NOTE — Telephone Encounter (Signed)
Pt states she received call from the pharmacy and the medication needs a PA from the doctor.

## 2019-06-24 ENCOUNTER — Ambulatory Visit (HOSPITAL_COMMUNITY)
Admission: RE | Admit: 2019-06-24 | Discharge: 2019-06-24 | Disposition: A | Discharge status: Home / Self Care | Payer: Medicare HMO | Source: Ambulatory Visit | Attending: Cardiology | Admitting: Cardiology

## 2019-06-24 ENCOUNTER — Other Ambulatory Visit: Payer: Self-pay

## 2019-06-24 DIAGNOSIS — R0989 Other specified symptoms and signs involving the circulatory and respiratory systems: Secondary | ICD-10-CM | POA: Insufficient documentation

## 2019-06-24 DIAGNOSIS — I739 Peripheral vascular disease, unspecified: Secondary | ICD-10-CM | POA: Diagnosis not present

## 2019-06-26 NOTE — Progress Notes (Signed)
Subjective:   Patient ID: Mikayla Harrison, female   DOB: 86 y.o.   MRN: [id number]   HPI  86 year old female presents the office today for concerns of left heel, foot pain.  She states that she has been having discomfort and swelling for years but is been getting worse.  Is been a constant issue.  She states that she saw another doctor previously for this and she was given a hard heel cup which she could not wear.  She also gets burning to her legs and feet due to neuropathy.  She feels the discomfort she is experiencing is different than the neuropathy.  She denies any recent injury.  No weakness or falls recently.  She was previously given a prescription for gabapentin for the neuropathy but she did not take this due to side effects potentially.  She has no other concerns today.   Review of Systems  All other systems reviewed and are negative.  Past Medical History:  Diagnosis Date  . CKD (chronic kidney disease) stage 3, GFR 30-59 ml/min   . Colon polyp   . Diverticulosis   . Hypertension   . Iron deficiency   . Prediabetes   . Prediabetes   . Small bowel obstruction [facility])     Past Surgical History:  Procedure Laterality Date  . CATARACT EXTRACTION    . EYE SURGERY    . MACULAR TEAR    . Vericose vein  1960     Current Outpatient Medications:  .  acetaminophen (TYLENOL) 325 MG tablet, Take 325 mg by mouth every 6 (six) hours as needed., Disp: , Rfl:  .  amLODipine (NORVASC) 10 MG tablet, TAKE 1/2 TO 1 (ONE-HALF TO ONE) TABLET BY MOUTH ONCE DAILY FOR BLOOD PRESSURE, Disp: 90 tablet, Rfl: 0 .  aspirin 81 MG tablet, Take 81 mg by mouth daily.  , Disp: , Rfl:  .  atenolol (TENORMIN) 50 MG tablet, Take 1 tablet daily for BP, Disp: 90 tablet, Rfl: 3 .  benazepril (LOTENSIN) 40 MG tablet, Take 1 tablet (40 mg total) by mouth daily., Disp: 90 tablet, Rfl: 2 .  Cholecalciferol (VITAMIN D3) 1000 units CAPS, Take 1,000 Units by mouth 2 (two) times daily., Disp: , Rfl:  .  clorazepate  (TRANXENE) 7.5 MG tablet, TAKE 1/2 TO 1 (ONE-HALF TO ONE) TABLET BY MOUTH 2 TO 3 TIMES DAILY AS NEEDED FOR ANXIETY, Disp: 90 tablet, Rfl: 0 .  levothyroxine (SYNTHROID) 150 MCG tablet, Take 1 tablet (150 mcg total) by mouth daily., Disp: 90 tablet, Rfl: 1 .  meclizine (ANTIVERT) 25 MG tablet, TAKE 1/2 TO 1 (ONE-HALF TO ONE) TABLET BY MOUTH THREE TIMES DAILY FOR  MOTION  SICKNESS/DIZZINESS, Disp: 30 tablet, Rfl: 0 .  diclofenac Sodium (VOLTAREN) 1 % GEL, Apply 2 g topically 2 (two) times daily. Rub into affected area of foot 2 times daily as needed, Disp: 100 g, Rfl: 0  Allergies  Allergen Reactions  . Sulfa Antibiotics Other (See Comments)  . Asa [Aspirin] Other (See Comments)    Take 81mg  cannot take 325mg  gets nervous  . Codeine Nausea Only  . Prednisone Nausea And Vomiting         Objective:  Physical Exam  General: AAO x3, NAD  Dermatological: Skin is warm, dry and supple bilateral. Nails x 10 are well manicured; remaining integument appears unremarkable at this time. There are no open sores, no preulcerative lesions, no rash or signs of infection present.  Vascular: Dorsalis Pedis artery and  Posterior Tibial artery pedal pulses are palpable bilaterally although decreased with immedate capillary fill time. Pedal hair growth present. No varicosities and no lower extremity edema present bilateral. There is no pain with calf compression, swelling, warmth, erythema.  Mild swelling to the foot but no erythema or warmth.  Neruologic: Sensation decreased with Semmes Weinstein monofilament.  Negative Tinel sign.  Musculoskeletal: There is tenderness on the plantar aspect of the calcaneus insertion of plantar fascia.  There is no pain with lateral compression of calcaneus.  There is no other areas of pinpoint tenderness identified today.  Muscular strength 5/5 in all groups tested bilateral.  Gait: Unassisted, Nonantalgic.       Assessment:   Left foot heel pain, plantar fasciitis,  neuropathy     Plan:  -Treatment options discussed including all alternatives, risks, and complications -Etiology of symptoms were discussed -X-rays were obtained and reviewed with the patient.  There is no evidence of acute fracture or stress fracture identified today. -Plantar fascial brace dispensed.  Discussed stretching, icing daily.  Prescribed Voltaren gel to use as needed. -Will order ABI to ensure no circulatory issues. Attempted to do an ABI in the office but it was not working  -Discussed PT -Wants to hold off on oral medications for neuropathy.    Trula Slade DPM

## 2019-06-30 ENCOUNTER — Telehealth: Payer: Self-pay | Admitting: *Deleted

## 2019-06-30 NOTE — Telephone Encounter (Signed)
I informed pt of Dr. Leigh Aurora review of results and pt states she already knew from the girl that rant the test. Pt states the pharmacy said she needed a approval from the doctor for the cream Dr. Jacqualyn Posey ordered. I told pt the cream was over-the-counter and did not need a prescription or pre-cert. Pt states it doesn't say anything about being an antiinflammatory medication. I told pt is for pain and inflammation. Pt states she already has some.

## 2019-06-30 NOTE — Telephone Encounter (Signed)
-----   Message from Trula Slade, DPM sent at 06/26/2019 12:57 PM EST ----- Please let her know that the circulation is good except for minimal decrease to the toes. Will monitor for any future symptoms but currently her issues are not due to circulation.

## 2019-07-02 ENCOUNTER — Encounter: Payer: Self-pay | Admitting: General Practice

## 2019-07-02 ENCOUNTER — Telehealth: Payer: Self-pay | Admitting: *Deleted

## 2019-07-02 ENCOUNTER — Ambulatory Visit: Payer: Medicare HMO | Admitting: Cardiovascular Disease

## 2019-07-02 NOTE — Telephone Encounter (Signed)
Called and left a message stating that the voltaren gel 1% cream can be purchased over the counter in the pharmacy and to call me back at the [place] office if any concerns or questions at [phone]. Mikayla Harrison

## 2019-07-06 ENCOUNTER — Other Ambulatory Visit: Payer: Self-pay

## 2019-07-06 ENCOUNTER — Ambulatory Visit: Payer: Medicare HMO | Admitting: Podiatry

## 2019-07-06 DIAGNOSIS — G629 Polyneuropathy, unspecified: Secondary | ICD-10-CM

## 2019-07-06 DIAGNOSIS — M722 Plantar fascial fibromatosis: Secondary | ICD-10-CM

## 2019-07-06 DIAGNOSIS — R609 Edema, unspecified: Secondary | ICD-10-CM | POA: Diagnosis not present

## 2019-07-06 NOTE — Patient Instructions (Signed)
Plantar Fasciitis (Heel Spur Syndrome) with Rehab The plantar fascia is a fibrous, ligament-like, soft-tissue structure that spans the bottom of the foot. Plantar fasciitis is a condition that causes pain in the foot due to inflammation of the tissue. SYMPTOMS   Pain and tenderness on the underneath side of the foot.  Pain that worsens with standing or walking. CAUSES  Plantar fasciitis is caused by irritation and injury to the plantar fascia on the underneath side of the foot. Common mechanisms of injury include:  Direct trauma to bottom of the foot.  Damage to a small nerve that runs under the foot where the main fascia attaches to the heel bone.  Stress placed on the plantar fascia due to bone spurs. RISK INCREASES WITH:   Activities that place stress on the plantar fascia (running, jumping, pivoting, or cutting).  Poor strength and flexibility.  Improperly fitted shoes.  Tight calf muscles.  Flat feet.  Failure to warm-up properly before activity.  Obesity. PREVENTION  Warm up and stretch properly before activity.  Allow for adequate recovery between workouts.  Maintain physical fitness:  Strength, flexibility, and endurance.  Cardiovascular fitness.  Maintain a health body weight.  Avoid stress on the plantar fascia.  Wear properly fitted shoes, including arch supports for individuals who have flat feet.  PROGNOSIS  If treated properly, then the symptoms of plantar fasciitis usually resolve without surgery. However, occasionally surgery is necessary.  RELATED COMPLICATIONS   Recurrent symptoms that may result in a chronic condition.  Problems of the lower back that are caused by compensating for the injury, such as limping.  Pain or weakness of the foot during push-off following surgery.  Chronic inflammation, scarring, and partial or complete fascia tear, occurring more often from repeated injections.  TREATMENT  Treatment initially involves the  use of ice and medication to help reduce pain and inflammation. The use of strengthening and stretching exercises may help reduce pain with activity, especially stretches of the Achilles tendon. These exercises may be performed at home or with a therapist. Your caregiver may recommend that you use heel cups of arch supports to help reduce stress on the plantar fascia. Occasionally, corticosteroid injections are given to reduce inflammation. If symptoms persist for greater than 6 months despite non-surgical (conservative), then surgery may be recommended.   MEDICATION   If pain medication is necessary, then nonsteroidal anti-inflammatory medications, such as aspirin and ibuprofen, or other minor pain relievers, such as acetaminophen, are often recommended.  Do not take pain medication within 7 days before surgery.  Prescription pain relievers may be given if deemed necessary by your caregiver. Use only as directed and only as much as you need.  Corticosteroid injections may be given by your caregiver. These injections should be reserved for the most serious cases, because they may only be given a certain number of times.  HEAT AND COLD  Cold treatment (icing) relieves pain and reduces inflammation. Cold treatment should be applied for 10 to 15 minutes every 2 to 3 hours for inflammation and pain and immediately after any activity that aggravates your symptoms. Use ice packs or massage the area with a piece of ice (ice massage).  Heat treatment may be used prior to performing the stretching and strengthening activities prescribed by your caregiver, physical therapist, or athletic trainer. Use a heat pack or soak the injury in warm water.  SEEK IMMEDIATE MEDICAL CARE IF:  Treatment seems to offer no benefit, or the condition worsens.  Any medications   produce adverse side effects.  EXERCISES- RANGE OF MOTION (ROM) AND STRETCHING EXERCISES - Plantar Fasciitis (Heel Spur Syndrome) These exercises  may help you when beginning to rehabilitate your injury. Your symptoms may resolve with or without further involvement from your physician, physical therapist or athletic trainer. While completing these exercises, remember:   Restoring tissue flexibility helps normal motion to return to the joints. This allows healthier, less painful movement and activity.  An effective stretch should be held for at least 30 seconds.  A stretch should never be painful. You should only feel a gentle lengthening or release in the stretched tissue.  RANGE OF MOTION - Toe Extension, Flexion  Sit with your right / left leg crossed over your opposite knee.  Grasp your toes and gently pull them back toward the top of your foot. You should feel a stretch on the bottom of your toes and/or foot.  Hold this stretch for 10 seconds.  Now, gently pull your toes toward the bottom of your foot. You should feel a stretch on the top of your toes and or foot.  Hold this stretch for 10 seconds. Repeat  times. Complete this stretch 3 times per day.   RANGE OF MOTION - Ankle Dorsiflexion, Active Assisted  Remove shoes and sit on a chair that is preferably not on a carpeted surface.  Place right / left foot under knee. Extend your opposite leg for support.  Keeping your heel down, slide your right / left foot back toward the chair until you feel a stretch at your ankle or calf. If you do not feel a stretch, slide your bottom forward to the edge of the chair, while still keeping your heel down.  Hold this stretch for 10 seconds. Repeat 3 times. Complete this stretch 2 times per day.   STRETCH  Gastroc, Standing  Place hands on wall.  Extend right / left leg, keeping the front knee somewhat bent.  Slightly point your toes inward on your back foot.  Keeping your right / left heel on the floor and your knee straight, shift your weight toward the wall, not allowing your back to arch.  You should feel a gentle stretch  in the right / left calf. Hold this position for 10 seconds. Repeat 3 times. Complete this stretch 2 times per day.  STRETCH  Soleus, Standing  Place hands on wall.  Extend right / left leg, keeping the other knee somewhat bent.  Slightly point your toes inward on your back foot.  Keep your right / left heel on the floor, bend your back knee, and slightly shift your weight over the back leg so that you feel a gentle stretch deep in your back calf.  Hold this position for 10 seconds. Repeat 3 times. Complete this stretch 2 times per day.  STRETCH  Gastrocsoleus, Standing  Note: This exercise can place a lot of stress on your foot and ankle. Please complete this exercise only if specifically instructed by your caregiver.   Place the ball of your right / left foot on a step, keeping your other foot firmly on the same step.  Hold on to the wall or a rail for balance.  Slowly lift your other foot, allowing your body weight to press your heel down over the edge of the step.  You should feel a stretch in your right / left calf.  Hold this position for 10 seconds.  Repeat this exercise with a slight bend in your right /   left knee. Repeat 3 times. Complete this stretch 2 times per day.   STRENGTHENING EXERCISES - Plantar Fasciitis (Heel Spur Syndrome)  These exercises may help you when beginning to rehabilitate your injury. They may resolve your symptoms with or without further involvement from your physician, physical therapist or athletic trainer. While completing these exercises, remember:   Muscles can gain both the endurance and the strength needed for everyday activities through controlled exercises.  Complete these exercises as instructed by your physician, physical therapist or athletic trainer. Progress the resistance and repetitions only as guided.  STRENGTH - Towel Curls  Sit in a chair positioned on a non-carpeted surface.  Place your foot on a towel, keeping your heel  on the floor.  Pull the towel toward your heel by only curling your toes. Keep your heel on the floor. Repeat 3 times. Complete this exercise 2 times per day.  STRENGTH - Ankle Inversion  Secure one end of a rubber exercise band/tubing to a fixed object (table, pole). Loop the other end around your foot just before your toes.  Place your fists between your knees. This will focus your strengthening at your ankle.  Slowly, pull your big toe up and in, making sure the band/tubing is positioned to resist the entire motion.  Hold this position for 10 seconds.  Have your muscles resist the band/tubing as it slowly pulls your foot back to the starting position. Repeat 3 times. Complete this exercises 2 times per day.  Document Released: 05/28/2005 Document Revised: 08/20/2011 Document Reviewed: 09/09/2008 [facility] Patient Information 2014 [place], [place].   Peripheral Neuropathy Peripheral neuropathy is a type of nerve damage. It affects nerves that carry signals between the spinal cord and the arms, legs, and the rest of the body (peripheral nerves). It does not affect nerves in the spinal cord or brain. In peripheral neuropathy, one nerve or a group of nerves may be damaged. Peripheral neuropathy is a broad category that includes many specific nerve disorders, like diabetic neuropathy, hereditary neuropathy, and carpal tunnel syndrome. What are the causes? This condition may be caused by:  Diabetes. This is the most common cause of peripheral neuropathy.  Nerve injury.  Pressure or stress on a nerve that lasts a long time.  Lack (deficiency) of B vitamins. This can result from alcoholism, poor diet, or a restricted diet.  Infections.  Autoimmune diseases, such as rheumatoid arthritis and systemic lupus erythematosus.  Nerve diseases that are passed from parent to child (inherited).  Some medicines, such as cancer medicines (chemotherapy).  Poisonous (toxic) substances, such as  lead and mercury.  Too little blood flowing to the legs.  Kidney disease.  Thyroid disease. In some cases, the cause of this condition is not known. What are the signs or symptoms? Symptoms of this condition depend on which of your nerves is damaged. Common symptoms include:  Loss of feeling (numbness) in the feet, hands, or both.  Tingling in the feet, hands, or both.  Burning pain.  Very sensitive skin.  Weakness.  Not being able to move a part of the body (paralysis).  Muscle twitching.  Clumsiness or poor coordination.  Loss of balance.  Not being able to control your bladder.  Feeling dizzy.  Sexual problems. How is this diagnosed? Diagnosing and finding the cause of peripheral neuropathy can be difficult. Your health care provider will take your medical history and do a physical exam. A neurological exam will also be done. This involves checking things that are affected  by your brain, spinal cord, and nerves (nervous system). For example, your health care provider will check your reflexes, how you move, and what you can feel. You may have other tests, such as:  Blood tests.  Electromyogram (EMG) and nerve conduction tests. These tests check nerve function and how well the nerves are controlling the muscles.  Imaging tests, such as CT scans or MRI to rule out other causes of your symptoms.  Removing a small piece of nerve to be examined in a lab (nerve biopsy). This is rare.  Removing and examining a small amount of the fluid that surrounds the brain and spinal cord (lumbar puncture). This is rare. How is this treated? Treatment for this condition may involve:  Treating the underlying cause of the neuropathy, such as diabetes, kidney disease, or vitamin deficiencies.  Stopping medicines that can cause neuropathy, such as chemotherapy.  Medicine to relieve pain. Medicines may include: ? Prescription or over-the-counter pain medicine. ? Antiseizure  medicine. ? Antidepressants. ? Pain-relieving patches that are applied to painful areas of skin.  Surgery to relieve pressure on a nerve or to destroy a nerve that is causing pain.  Physical therapy to help improve movement and balance.  Devices to help you move around (assistive devices). Follow these instructions at home: Medicines  Take over-the-counter and prescription medicines only as told by your health care provider. Do not take any other medicines without first asking your health care provider.  Do not drive or use heavy machinery while taking prescription pain medicine. Lifestyle   Do not use any products that contain nicotine or tobacco, such as cigarettes and e-cigarettes. Smoking keeps blood from reaching damaged nerves. If you need help quitting, ask your health care provider.  Avoid or limit alcohol. Too much alcohol can cause a vitamin B deficiency, and vitamin B is needed for healthy nerves.  Eat a healthy diet. This includes: ? Eating foods that are high in fiber, such as fresh fruits and vegetables, whole grains, and beans. ? Limiting foods that are high in fat and processed sugars, such as fried or sweet foods. General instructions   If you have diabetes, work closely with your health care provider to keep your blood sugar under control.  If you have numbness in your feet: ? Check every day for signs of injury or infection. Watch for redness, warmth, and swelling. ? Wear padded socks and comfortable shoes. These help protect your feet.  Develop a good support system. Living with peripheral neuropathy can be stressful. Consider talking with a mental health specialist or joining a support group.  Use assistive devices and attend physical therapy as told by your health care provider. This may include using a walker or a cane.  Keep all follow-up visits as told by your health care provider. This is important. Contact a health care provider if:  You have new  signs or symptoms of peripheral neuropathy.  You are struggling emotionally from dealing with peripheral neuropathy.  Your pain is not well-controlled. Get help right away if:  You have an injury or infection that is not healing normally.  You develop new weakness in an arm or leg.  You fall frequently. Summary  Peripheral neuropathy is when the nerves in the arms, or legs are damaged, resulting in numbness, weakness, or pain.  There are many causes of peripheral neuropathy, including diabetes, pinched nerves, vitamin deficiencies, autoimmune disease, and hereditary conditions.  Diagnosing and finding the cause of peripheral neuropathy can be difficult.  Your health care provider will take your medical history, do a physical exam, and do tests, including blood tests and nerve function tests.  Treatment involves treating the underlying cause of the neuropathy and taking medicines to help control pain. Physical therapy and assistive devices may also help. This information is not intended to replace advice given to you by your health care provider. Make sure you discuss any questions you have with your health care provider. Document Revised: 05/10/2017 Document Reviewed: 08/06/2016 [employer] Patient Education  2020 [employer].

## 2019-07-06 NOTE — Progress Notes (Signed)
Subjective: 86 year old female presents the office today for follow-up evaluation of left foot pain, plantar fasciitis and neuropathy.  She states that she still has burning to her legs and feet and she was previously given gabapentin but she did not want to this due to side effects and she wants to hold off on medications as much as possible.  She states the plantar fasciitis is minimal and she still episodes her activities without any significant problems although she still notices discomfort.  Swelling she reports is somewhat better compared to last appointment. Denies any systemic complaints such as fevers, chills, nausea, vomiting. No acute changes since last appointment, and no other complaints at this time.   Objective: AAO x3, NAD DP/PT pulses palpable bilaterally, CRT less than 3 seconds Varicose veins are present there is chronic bilateral lower extremity edema.  There is minimal tenderness palpation on plantar medial tubercle of the calcaneus at the insertion of the plantar fascial.  Plantar fascial appears to be intact.  There is no other areas of discomfort. No open lesions or pre-ulcerative lesions.  No pain with calf compression, swelling, warmth, erythema  Assessment: Bilateral lower extremity edema, neuropathy with plantar fasciitis  Plan: -All treatment options discussed with the patient including all alternatives, risks, complications.  -Reviewed arterial studies with her. -Dispensed a gel heel cup for her today.  Discussed stretching exercises which she has been doing intermittently.  Discussed rolling a frozen water bottle as well. -She was to hold off on medications for neuropathy.  She is going to follow-up with her primary care physician as she is interested in prescription vitamins. -Elevation and limit salt intake. Compression stockings.  -Patient encouraged to call the office with any questions, concerns, change in symptoms.   Trula Slade DPM

## 2019-07-12 NOTE — Progress Notes (Signed)
History of Present Illness:      This very nice 86 y.o. mWWF presents for 6 month follow up with HTN, HLD, Pre-Diabetes and Vitamin D Deficiency.       Patient is treated for HTN (1986) & BP has been controlled at home. Today's BP is at goal - 132/62. Patient has had no complaints of any cardiac type chest pain, palpitations, dyspnea / orthopnea / PND, dizziness, claudication, or dependent edema.      Hyperlipidemia is controlled with diet & meds. Patient denies myalgias or other med SE's. Last Lipids were at goal:  Lab Results  Component Value Date   CHOL 148 04/08/2019   HDL 37 (L) 04/08/2019   LDLCALC 86 04/08/2019   TRIG 151 (H) 04/08/2019   CHOLHDL 4.0 04/08/2019        Also, the patient has history of PreDiabetes (A1c 5.7% / 2014 and 5.8% / 2018)  and has had no symptoms of reactive hypoglycemia, diabetic polys, paresthesias or visual blurring.  Last A1c was not at goal:  Lab Results  Component Value Date   HGBA1C 5.8 (H) 04/08/2019        Further, the patient also has history of Vitamin D Deficiency("36" / 2009 and "29" / 2016)  and supplements vitamin D without any suspected side-effects. Last vitamin D was near goal (70-100): Lab Results  Component Value Date   VD25OH 50 01/06/2019    Current Outpatient Medications on File Prior to Visit  Medication Sig  . acetaminophen (TYLENOL) 325 MG tablet Take 325 mg by mouth every 6 (six) hours as needed.  Marland Kitchen amLODipine (NORVASC) 10 MG tablet TAKE 1/2 TO 1 (ONE-HALF TO ONE) TABLET BY MOUTH ONCE DAILY FOR BLOOD PRESSURE  . aspirin 81 MG tablet Take 81 mg by mouth daily.    Marland Kitchen atenolol (TENORMIN) 50 MG tablet Take 1 tablet daily for BP  . benazepril (LOTENSIN) 40 MG tablet Take 1 tablet (40 mg total) by mouth daily.  . Cholecalciferol (VITAMIN D3) 1000 units CAPS Take 1,000 Units by mouth 2 (two) times daily.  . clorazepate (TRANXENE) 7.5 MG tablet TAKE 1/2 TO 1 (ONE-HALF TO ONE) TABLET BY MOUTH 2 TO 3 TIMES DAILY AS NEEDED  FOR ANXIETY  . diclofenac Sodium (VOLTAREN) 1 % GEL Apply 2 g topically 2 (two) times daily. Rub into affected area of foot 2 times daily as needed  . levothyroxine (SYNTHROID) 150 MCG tablet Take 1 tablet (150 mcg total) by mouth daily.  . meclizine (ANTIVERT) 25 MG tablet TAKE 1/2 TO 1 (ONE-HALF TO ONE) TABLET BY MOUTH THREE TIMES DAILY FOR  MOTION  SICKNESS/DIZZINESS  . vitamin B-12 (CYANOCOBALAMIN) 500 MCG tablet Take 500 mcg by mouth daily.   No current facility-administered medications on file prior to visit.    Allergies  Allergen Reactions  . Sulfa Antibiotics Other (See Comments)  . Asa [Aspirin] Other (See Comments)    Take 81mg  cannot take 325mg  gets nervous  . Codeine Nausea Only  . Prednisone Nausea And Vomiting    PMHx:   Past Medical History:  Diagnosis Date  . CKD (chronic kidney disease) stage 3, GFR 30-59 ml/min   . Colon polyp   . Diverticulosis   . Hypertension   . Iron deficiency   . Prediabetes   . Prediabetes   . Small bowel obstruction ([place])    Immunization History  Administered Date(s) Administered  . DT (Pediatric) 03/22/2015  . Influenza Split 03/04/2013  .  Influenza, High Dose Seasonal PF 03/16/2014, 03/22/2015, 02/15/2016, 04/23/2017, 04/30/2018, 04/08/2019  . Pneumococcal Polysaccharide-23 03/16/2014  . Td 10/10/2017   Past Surgical History:  Procedure Laterality Date  . CATARACT EXTRACTION    . EYE SURGERY    . MACULAR TEAR    . Vericose vein  1960    FHx:    Reviewed / unchanged  SHx:    Reviewed / unchanged   Systems Review:  Constitutional: Denies fever, chills, wt changes, headaches, insomnia, fatigue, night sweats, change in appetite. Eyes: Denies redness, blurred vision, diplopia, discharge, itchy, watery eyes.  ENT: Denies discharge, congestion, post nasal drip, epistaxis, sore throat, earache, hearing loss, dental pain, tinnitus, vertigo, sinus pain, snoring.  CV: Denies chest pain, palpitations, irregular heartbeat,  syncope, dyspnea, diaphoresis, orthopnea, PND, claudication or edema. Respiratory: denies cough, dyspnea, DOE, pleurisy, hoarseness, laryngitis, wheezing.  Gastrointestinal: Denies dysphagia, odynophagia, heartburn, reflux, water brash, abdominal pain or cramps, nausea, vomiting, bloating, diarrhea, constipation, hematemesis, melena, hematochezia  or hemorrhoids. Genitourinary: Denies dysuria, frequency, urgency, nocturia, hesitancy, discharge, hematuria or flank pain. Musculoskeletal: Denies arthralgias, myalgias, stiffness, jt. swelling, pain, limping or strain/sprain.  Skin: Denies pruritus, rash, hives, warts, acne, eczema or change in skin lesion(s). Neuro: No weakness, tremor, incoordination, spasms, paresthesia or pain. Psychiatric: Denies confusion, memory loss or sensory loss. Endo: Denies change in weight, skin or hair change.  Heme/Lymph: No excessive bleeding, bruising or enlarged lymph nodes.  Physical Exam  BP 132/62   Pulse (!) 50   Temp (!) 97.5 F (36.4 C)   Ht 5\' 2"  (1.575 m)   Wt 163 lb (73.9 kg)   SpO2 99%   BMI 29.81 kg/m   Appears  well nourished, well groomed  and in no distress.  Eyes: PERRLA, EOMs, conjunctiva no swelling or erythema. Sinuses: No frontal/maxillary tenderness ENT/Mouth: EAC's clear, TM's nl w/o erythema, bulging. Nares clear w/o erythema, swelling, exudates. Oropharynx clear without erythema or exudates. Oral hygiene is good. Tongue normal, non obstructing. Hearing intact.  Neck: Supple. Thyroid not palpable. Car 2+/2+ without bruits, nodes or JVD. Chest: Respirations nl with BS clear & equal w/o rales, rhonchi, wheezing or stridor.  Cor: Heart sounds normal w/ regular rate and rhythm without sig. murmurs, gallops, clicks or rubs. Peripheral pulses normal and equal  without edema.  Abdomen: Soft & bowel sounds normal. Non-tender w/o guarding, rebound, hernias, masses or organomegaly.  Lymphatics: Unremarkable.  Musculoskeletal: Full ROM all  peripheral extremities, joint stability, 5/5 strength and normal gait.  Skin: Warm, dry without exposed rashes, lesions or ecchymosis apparent.  Neuro: Cranial nerves intact, reflexes equal bilaterally. Sensory-motor testing grossly intact. Tendon reflexes grossly intact.  Pysch: Alert & oriented x 3.  Insight and judgement nl & appropriate. No ideations.  Assessment and Plan:  1. Essential hypertension  - Continue medication, monitor blood pressure at home.  - Continue DASH diet.  Reminder to go to the ER if any CP,  SOB, nausea, dizziness, severe HA, changes vision/speech.  - CBC with Differential/Platelet - COMPLETE METABOLIC PANEL WITH GFR - Magnesium - TSH  2. Hyperlipidemia, mixed  - Continue diet/meds, exercise,& lifestyle modifications.  - Continue monitor periodic cholesterol/liver & renal functions   - Lipid panel - TSH  3. Abnormal glucose  - Continue diet, exercise  - Lifestyle modifications.  - Monitor appropriate labs.  - Hemoglobin A1c - Insulin, random  4. Vitamin D deficiency  - Continue supplementation.  - VITAMIN D 25 Hydroxy  5. Hypothyroidism  - TSH  6.  Medication management  - CBC with Differential/Platelet - COMPLETE METABOLIC PANEL WITH GFR - Magnesium - Lipid panel - TSH - Hemoglobin A1c - Insulin, random - VITAMIN D 25 Hydroxy        Discussed  regular exercise, BP monitoring, weight control to achieve/maintain BMI less than 25 and discussed med and SE's. Recommended labs to assess and monitor clinical status with further disposition pending results of labs.  I discussed the assessment and treatment plan with the patient. The patient was provided an opportunity to ask questions and all were answered. The patient agreed with the plan and demonstrated an understanding of the instructions.  I provided over 30 minutes of exam, counseling, chart review and  complex critical decision making.  Kirtland Bouchard, MD

## 2019-07-12 NOTE — Patient Instructions (Signed)

## 2019-07-13 ENCOUNTER — Ambulatory Visit (INDEPENDENT_AMBULATORY_CARE_PROVIDER_SITE_OTHER): Payer: Medicare HMO | Admitting: Internal Medicine

## 2019-07-13 ENCOUNTER — Encounter: Payer: Self-pay | Admitting: Internal Medicine

## 2019-07-13 ENCOUNTER — Other Ambulatory Visit: Payer: Self-pay

## 2019-07-13 VITALS — BP 132/62 | HR 50 | Temp 97.5°F | Ht 62.0 in | Wt 163.0 lb

## 2019-07-13 DIAGNOSIS — E782 Mixed hyperlipidemia: Secondary | ICD-10-CM | POA: Diagnosis not present

## 2019-07-13 DIAGNOSIS — R300 Dysuria: Secondary | ICD-10-CM

## 2019-07-13 DIAGNOSIS — I10 Essential (primary) hypertension: Secondary | ICD-10-CM

## 2019-07-13 DIAGNOSIS — Z79899 Other long term (current) drug therapy: Secondary | ICD-10-CM

## 2019-07-13 DIAGNOSIS — E559 Vitamin D deficiency, unspecified: Secondary | ICD-10-CM

## 2019-07-13 DIAGNOSIS — R7309 Other abnormal glucose: Secondary | ICD-10-CM | POA: Diagnosis not present

## 2019-07-13 DIAGNOSIS — E039 Hypothyroidism, unspecified: Secondary | ICD-10-CM

## 2019-07-14 LAB — COMPLETE METABOLIC PANEL WITH GFR
AG Ratio: 1.3 (calc) (ref 1.0–2.5)
ALT: 11 U/L (ref 6–29)
AST: 20 U/L (ref 10–35)
Albumin: 3.9 g/dL (ref 3.6–5.1)
Alkaline phosphatase (APISO): 73 U/L (ref 37–153)
BUN/Creatinine Ratio: 18 (calc) (ref 6–22)
BUN: 22 mg/dL (ref 7–25)
CO2: 25 mmol/L (ref 20–32)
Calcium: 9.2 mg/dL (ref 8.6–10.4)
Chloride: 102 mmol/L (ref 98–110)
Creat: 1.21 mg/dL — ABNORMAL HIGH (ref 0.60–0.88)
GFR, Est African American: 47 mL/min/{1.73_m2} — ABNORMAL LOW (ref >=60)
GFR, Est Non African American: 41 mL/min/{1.73_m2} — ABNORMAL LOW (ref >=60)
Globulin: 3 g/dL (calc) (ref 1.9–3.7)
Glucose, Bld: 94 mg/dL (ref 65–99)
Potassium: 4.5 mmol/L (ref 3.5–5.3)
Sodium: 137 mmol/L (ref 135–146)
Total Bilirubin: 0.3 mg/dL (ref 0.2–1.2)
Total Protein: 6.9 g/dL (ref 6.1–8.1)

## 2019-07-14 LAB — LIPID PANEL
Cholesterol: 142 mg/dL (ref <200)
HDL: 38 mg/dL — ABNORMAL LOW (ref >=50)
LDL Cholesterol (Calc): 81 mg/dL (calc)
Non-HDL Cholesterol (Calc): 104 mg/dL (calc) (ref <130)
Total CHOL/HDL Ratio: 3.7 (calc) (ref <5.0)
Triglycerides: 124 mg/dL (ref <150)

## 2019-07-14 LAB — CBC WITH DIFFERENTIAL/PLATELET
Absolute Monocytes: 749 cells/uL (ref 200–950)
Basophils Absolute: 48 cells/uL (ref 0–200)
Basophils Relative: 0.5 %
Eosinophils Absolute: 173 cells/uL (ref 15–500)
Eosinophils Relative: 1.8 %
HCT: 32.1 % — ABNORMAL LOW (ref 35.0–45.0)
Hemoglobin: 10.6 g/dL — ABNORMAL LOW (ref 11.7–15.5)
Lymphs Abs: 1786 cells/uL (ref 850–3900)
MCH: 28.6 pg (ref 27.0–33.0)
MCHC: 33 g/dL (ref 32.0–36.0)
MCV: 86.5 fL (ref 80.0–100.0)
MPV: 12 fL (ref 7.5–12.5)
Monocytes Relative: 7.8 %
Neutro Abs: 6845 cells/uL (ref 1500–7800)
Neutrophils Relative %: 71.3 %
Platelets: 253 10*3/uL (ref 140–400)
RBC: 3.71 10*6/uL — ABNORMAL LOW (ref 3.80–5.10)
RDW: 12.8 % (ref 11.0–15.0)
Total Lymphocyte: 18.6 %
WBC: 9.6 10*3/uL (ref 3.8–10.8)

## 2019-07-14 LAB — MAGNESIUM: Magnesium: 2 mg/dL (ref 1.5–2.5)

## 2019-07-14 LAB — HEMOGLOBIN A1C
Hgb A1c MFr Bld: 6 % of total Hgb — ABNORMAL HIGH (ref <5.7)
Mean Plasma Glucose: 126 (calc)
eAG (mmol/L): 7 (calc)

## 2019-07-14 LAB — VITAMIN D 25 HYDROXY (VIT D DEFICIENCY, FRACTURES): Vit D, 25-Hydroxy: 37 ng/mL (ref 30–100)

## 2019-07-14 LAB — TSH: TSH: 4.81 mIU/L — ABNORMAL HIGH (ref 0.40–4.50)

## 2019-07-14 LAB — INSULIN, RANDOM: Insulin: 6.8 u[IU]/mL

## 2019-07-15 ENCOUNTER — Other Ambulatory Visit: Payer: Self-pay | Admitting: Internal Medicine

## 2019-07-15 DIAGNOSIS — N3001 Acute cystitis with hematuria: Secondary | ICD-10-CM

## 2019-07-15 LAB — URINALYSIS, ROUTINE W REFLEX MICROSCOPIC
Bacteria, UA: NONE SEEN /HPF
Bilirubin Urine: NEGATIVE
Glucose, UA: NEGATIVE
Hyaline Cast: NONE SEEN /LPF
Ketones, ur: NEGATIVE
Nitrite: NEGATIVE
Specific Gravity, Urine: 1.01 (ref 1.001–1.03)
Squamous Epithelial / HPF: NONE SEEN /HPF (ref <=5)
WBC, UA: >=60 /HPF — AB (ref 0–5)
pH: 6 (ref 5.0–8.0)

## 2019-07-15 LAB — URINE CULTURE
MICRO NUMBER:: 10102804
SPECIMEN QUALITY:: ADEQUATE

## 2019-07-15 MED ORDER — AMOXICILLIN 250 MG PO CAPS: ORAL_CAPSULE | ORAL | 0 refills | Status: DC

## 2019-07-20 ENCOUNTER — Other Ambulatory Visit: Payer: Self-pay | Admitting: Internal Medicine

## 2019-07-20 DIAGNOSIS — I10 Essential (primary) hypertension: Secondary | ICD-10-CM

## 2019-07-20 MED ORDER — BENAZEPRIL HCL 40 MG PO TABS: ORAL_TABLET | ORAL | 3 refills | Status: DC

## 2019-08-12 ENCOUNTER — Ambulatory Visit: Payer: Medicare HMO | Admitting: Internal Medicine

## 2019-08-12 NOTE — Progress Notes (Addendum)
RESCHEDULED

## 2019-08-18 ENCOUNTER — Other Ambulatory Visit: Payer: Self-pay

## 2019-08-18 ENCOUNTER — Ambulatory Visit (INDEPENDENT_AMBULATORY_CARE_PROVIDER_SITE_OTHER): Payer: Medicare HMO | Admitting: Internal Medicine

## 2019-08-18 VITALS — BP 116/64 | HR 56 | Temp 97.5°F | Resp 16 | Ht 62.0 in | Wt 165.0 lb

## 2019-08-18 DIAGNOSIS — Z79899 Other long term (current) drug therapy: Secondary | ICD-10-CM

## 2019-08-18 DIAGNOSIS — I10 Essential (primary) hypertension: Secondary | ICD-10-CM | POA: Diagnosis not present

## 2019-08-18 DIAGNOSIS — N3001 Acute cystitis with hematuria: Secondary | ICD-10-CM

## 2019-08-18 DIAGNOSIS — E039 Hypothyroidism, unspecified: Secondary | ICD-10-CM | POA: Diagnosis not present

## 2019-08-18 NOTE — Progress Notes (Signed)
   Subjective:    Patient ID: Mikayla Harrison, female    DOB: 1932-06-07, 86 y.o.   MRN: [id number]  HPI  Patient is a nice 86 y.o. [place] with HTN, HLD,  Hypothyroidism, Pre-Diabetes and Vitamin D Deficiency who was seen 02.01.2021 and U/C grew Enterococcus faecalis which was treated with amoxil x 2 weeks. Also her labs found TSH was sl elevated at 4.81. Patient's UT sx's have resolved.  Medication Sig  . acetaminophen  325 MG tablet Take  every 6  hours as needed.  Marland Kitchen amLODipine  10 MG tablet TAKE 1/2-1  TABLET  DAILY   . VITAMIN C  Take 1 tablet  daily.  Marland Kitchen aspirin 81 MG tablet Take daily.    Marland Kitchen atenolol  50 MG tablet Take 1 tablet daily for BP  . benazepril  40 MG tablet Take 1 tablet daily for BP  . VITAMIN D 1000 units  Take  2 x daily.  . clorazepate  7.5 MG tablet TAKE 1/2-1  TAB  2-3 x /day as needed  . levothyroxine  150 MCG tablet Take 1 tablet  daily.  . Magnesium 500 MG TABS Take 2 tablets  daily.  . meclizine 25 MG tablet TAKE 1/2 - 1 TAB 3 x/day as needed   . vitamin B-12  500 MCG tablet Take  daily.  Marland Kitchen zinc  50 MG tablet Takes 1/2 tablet daily.  . diclofenac  1 % GEL Apply 2 g topically 2 x daily    Allergies  Allergen Reactions  . Sulfa Antibiotics Other (See Comments)  . Asa [Aspirin] Other (See Comments)    Take 81mg  cannot take 325mg  gets nervous  . Codeine Nausea Only  . Prednisone Nausea And Vomiting   Past Medical History:  Diagnosis Date  . CKD (chronic kidney disease) stage 3, GFR 30-59 ml/min   . Colon polyp   . Diverticulosis   . Hypertension   . Iron deficiency   . Prediabetes   . Prediabetes   . Small bowel obstruction ([facility])    Review of Systems    10 point systems review negative except as above.     Objective:   Physical Exam  BP 116/64   Pulse (!) 56   Temp (!) 97.5 F (36.4 C)   Resp 16   Ht 5\' 2"  (1.575 m)   Wt 165 lb (74.8 kg)   BMI 30.18 kg/m   HEENT - WNL. Neck - supple.  Chest - Clear equal BS. Cor - Nl HS. RRR w/o sig MGR. PP  1(+). No edema. MS- FROM w/o deformities.  Gait Nl. Neuro -  Nl w/o focal abnormalities.    Assessment & Plan:   1. Essential hypertension  - TSH  2. Hypothyroidism, unspecified type  - TSH  3. Acute cystitis with hematuria  - Urinalysis, Routine w reflex microscopic - Urine Culture  4. Medication management  - TSH - Urinalysis, Routine w reflex microscopic - Urine Culture  - Discussed meds & SE's.

## 2019-08-19 LAB — URINALYSIS, ROUTINE W REFLEX MICROSCOPIC
Bacteria, UA: NONE SEEN /HPF
Bilirubin Urine: NEGATIVE
Glucose, UA: NEGATIVE
Hgb urine dipstick: NEGATIVE
Hyaline Cast: NONE SEEN /LPF
Ketones, ur: NEGATIVE
Nitrite: NEGATIVE
Protein, ur: NEGATIVE
RBC / HPF: NONE SEEN /HPF (ref 0–2)
Specific Gravity, Urine: 1.014 (ref 1.001–1.03)
Squamous Epithelial / HPF: NONE SEEN /HPF (ref <=5)
pH: <=5 (ref 5.0–8.0)

## 2019-08-19 LAB — TSH: TSH: 0.89 mIU/L (ref 0.40–4.50)

## 2019-08-19 LAB — URINE CULTURE
MICRO NUMBER:: 10231570
SPECIMEN QUALITY:: ADEQUATE

## 2019-08-20 DIAGNOSIS — Z961 Presence of intraocular lens: Secondary | ICD-10-CM | POA: Diagnosis not present

## 2019-08-20 DIAGNOSIS — H2512 Age-related nuclear cataract, left eye: Secondary | ICD-10-CM | POA: Diagnosis not present

## 2019-08-20 DIAGNOSIS — H524 Presbyopia: Secondary | ICD-10-CM | POA: Diagnosis not present

## 2019-08-22 ENCOUNTER — Encounter: Payer: Self-pay | Admitting: Internal Medicine

## 2019-09-21 ENCOUNTER — Other Ambulatory Visit: Payer: Self-pay | Admitting: Internal Medicine

## 2019-10-14 DIAGNOSIS — H25012 Cortical age-related cataract, left eye: Secondary | ICD-10-CM | POA: Diagnosis not present

## 2019-10-14 DIAGNOSIS — H2512 Age-related nuclear cataract, left eye: Secondary | ICD-10-CM | POA: Diagnosis not present

## 2019-10-15 ENCOUNTER — Ambulatory Visit: Payer: Medicare HMO | Admitting: Physician Assistant

## 2019-10-19 ENCOUNTER — Other Ambulatory Visit: Payer: Self-pay | Admitting: Internal Medicine

## 2019-10-19 DIAGNOSIS — R002 Palpitations: Secondary | ICD-10-CM

## 2019-10-19 DIAGNOSIS — I10 Essential (primary) hypertension: Secondary | ICD-10-CM

## 2019-10-19 DIAGNOSIS — H10411 Chronic giant papillary conjunctivitis, right eye: Secondary | ICD-10-CM | POA: Diagnosis not present

## 2019-10-23 ENCOUNTER — Other Ambulatory Visit: Payer: Self-pay | Admitting: Adult Health

## 2019-10-29 ENCOUNTER — Ambulatory Visit: Payer: Medicare HMO | Admitting: Adult Health

## 2019-10-29 NOTE — Progress Notes (Deleted)
MEDICARE ANNUAL WELLNESS VISIT AND FOLLOW UP    Assessment:     Encounter for Medicare annual wellness exam  Essential hypertension - continue medications, DASH diet, exercise and monitor at home. Call if greater than 130/80.  -check BP in the morning and at 9 oclock - take benazepril and bASA in the morning, take norvasc 1/2 at 7 without checking BP before that - if your BP is over 140/80 at 10 pm, you can take 1/2 of the atenolol  Abnormal glucose Discussed disease and risks Discussed diet/exercise, weight management  A1C  CKD Stage III (GFR 56 ml/min) Increase fluids, avoid NSAIDS, monitor sugars, will monitor - CMP/GFR  Hyperlipidemia At goal without medications at this time;  Continue low cholesterol diet and exercise.  Defer lipid panel as would not aggressively treat secondary to age  Medication management - Magnesium   Vitamin D deficiency Continue supplement  Hypothyroidism, unspecified hypothyroidism type -check TSH level, continue medications the same, reminded to take on an empty stomach 30-37mins before food.   Unifocal PVCs monitor, check labs   Diverticulosis of intestine without bleeding, unspecified intestinal tract location Controlled, diet   Raynaud phenomenon Declines meds at this time  Overweight Long discussion about weight loss, diet, and exercise Recommended diet heavy in fruits and veggies and low in animal meats, cheeses, and dairy products, appropriate calorie intake Discussed appropriate weight for height Follow up at next visit  History of TIA Control blood pressure, cholesterol, glucose, increase exercise.  Continue ASA  Estrogen deficiency -     DG Bone Density; Future   Over 30 minutes of exam, counseling, chart review, and critical decision making was performed Future Appointments  Date Time Provider [place]  10/30/2019 10:45 AM Vicie Mutters, PA-C [facility] None  02/09/2020  3:00 PM [name], MD  [facility] None  02/23/2020  1:15 PM [facility] 2 [facility] None  02/23/2020  1:45 PM Nicholas Lose, MD [facility] None     Plan:   During the course of the visit the patient was educated and counseled about appropriate screening and preventive services including:    Pneumococcal vaccine   Influenza vaccine  Td vaccine  Screening electrocardiogram  Screening mammography  Bone densitometry screening  Colorectal cancer screening  Diabetes screening  Glaucoma screening  Nutrition counseling   Advanced directives: given info/requested   Subjective:   Mikayla Harrison is a 86 y.o. female who presents for Medicare Annual Wellness Visit and 3 month follow up on hypertension, prediabetes, hyperlipidemia, vitamin D def.   She has had ongoing ear pain at her jaw, causes dizziness, states if she opens and closes her mouth she can "pop" her ear and make it feel better/relieve the issues, was given zpak 02/21. She is not on flonase.    On 4/11 she was given keflex and dexamethasone, she states she normal has swelling in her ankles but she did not have any since taking the prednisone for the bug bite but states she still had soreness. She has a history of CREST syndrome, may need to follow up with rheumatology.   She lives by herself, has a lot of anxiety at night, has clorazepate if needed, takes 1/2-1/4 tab occasionally.   BMI is There is no height or weight on file to calculate BMI., she has not been working on diet and exercise. Wt Readings from Last 3 Encounters:  08/18/19 165 lb (74.8 kg)  07/13/19 163 lb (73.9 kg)  04/08/19 168 lb 6.4 oz (76.4 kg)  Her blood pressure has been controlled at home, today their BP is  . She was switched from ACE to ARB due to cough however she has not switched and states cough got better with ABX/prednisone.  She checks her BP in the AM, at 5 pm occ will do 1/4 of atenolol or 1/2 of amlodipine, then she will check it at 8 and it will begin  to accerate  Norvasc atenolol at 5, benazepril at 7 or 8 30.   She does not workout but stays active around the house. She denies chest pain, shortness of breath, dizziness.   She is not on cholesterol medication and denies myalgias. Her cholesterol is at goal. The cholesterol last visit was:   Lab Results  Component Value Date   CHOL 142 07/13/2019   HDL 38 (L) 07/13/2019   LDLCALC 81 07/13/2019   TRIG 124 07/13/2019   CHOLHDL 3.7 07/13/2019    She has CKD stage III due to HTN. Lab Results  Component Value Date   GFRNONAA 41 (L) 07/13/2019   She has been working on diet and exercise for prediabetes, and denies paresthesia of the feet, polydipsia, polyuria and visual disturbances. She is on bASA.  Last A1C in the office was:  Lab Results  Component Value Date   HGBA1C 6.0 (H) 07/13/2019   Patient is on Vitamin D supplement. Lab Results  Component Value Date   VD25OH 37 07/13/2019     She is on thyroid medication. Her medication was not changed last visit.   Lab Results  Component Value Date   TSH 0.89 08/18/2019   Patient was held overnight for observation in May 2018 with a (-) Head CT, MRI & carotid dopplers and she was discharged home on LD bASA for suspected possible TIA.  Medication Review Current Outpatient Medications on File Prior to Visit  Medication Sig  . acetaminophen (TYLENOL) 325 MG tablet Take 325 mg by mouth every 6 (six) hours as needed.  Marland Kitchen amLODipine (NORVASC) 10 MG tablet TAKE 1/2 TO 1 (ONE-HALF TO ONE) TABLET BY MOUTH ONCE DAILY FOR BLOOD PRESSURE  . Ascorbic Acid (VITAMIN C PO) Take 1 tablet by mouth daily.  Marland Kitchen aspirin 81 MG tablet Take 81 mg by mouth daily.    Marland Kitchen atenolol (TENORMIN) 50 MG tablet Take 1 tablet Daily for BP  . benazepril (LOTENSIN) 40 MG tablet Take 1 tablet Daily for BP  . Cholecalciferol (VITAMIN D3) 1000 units CAPS Take 1,000 Units by mouth 2 (two) times daily.  . clorazepate (TRANXENE) 7.5 MG tablet TAKE 1/2 TO 1 (ONE-HALF TO ONE)  TABLET BY MOUTH 2 TO 3 TIMES DAILY AS NEEDED FOR ANXIETY  . Magnesium 500 MG TABS Take 2 tablets by mouth daily.  . meclizine (ANTIVERT) 25 MG tablet TAKE 1/2 TO 1 (ONE-HALF TO ONE) TABLET BY MOUTH THREE TIMES DAILY FOR  MOTION  SICKNESS/DIZZINESS  . SYNTHROID 150 MCG tablet Take 1 tablet daily on an empty stomach with only water for 30 minutes & no Antacid meds, Calcium or Magnesium for 4 hours & avoid Biotin  . vitamin B-12 (CYANOCOBALAMIN) 500 MCG tablet Take 500 mcg by mouth daily.  Marland Kitchen zinc gluconate 50 MG tablet Take 50 mg by mouth. Takes 1/2 tablet daily.   No current facility-administered medications on file prior to visit.    Current Problems (verified) Patient Active Problem List   Diagnosis Date Noted  . Enrolled in chronic care management 11/14/2018  . Normocytic anemia 06/10/2018  . CREST  syndrome ([place]) 02/12/2018  . Abnormal glucose 10/10/2017  . FHx: heart disease 10/10/2017  . History of TIA (transient ischemic attack) 11/06/2016  . Raynaud phenomenon 08/01/2015  . Overweight (BMI 25.0-29.9) 03/22/2015  . 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 CKD (chronic kidney disease) stage 3, GFR 30-59 ml/min ([facility]) 03/16/2014  . Hyperlipidemia, mixed 10/05/2013  . Screening for colorectal cancer 10/05/2013  . Vitamin D deficiency 10/05/2013  . Prediabetes   . Diverticulosis   . Essential hypertension 01/14/2012  . Hypothyroidism 01/14/2012    Screening Tests Immunization History  Administered Date(s) Administered  . DT (Pediatric) 03/22/2015  . Influenza Split 03/04/2013  . Influenza, High Dose Seasonal PF 03/16/2014, 03/22/2015, 02/15/2016, 04/23/2017, 04/30/2018, 04/08/2019  . Pneumococcal Polysaccharide-23 03/16/2014  . Td 10/10/2017    Preventative care: Last colonoscopy:  2004, will not get another EGD: 2012, Dr. Henrene Pastor Last mammogram: 2011, declines another and declines breast exam at this time.  Last pap smear/pelvic exam: remote DEXA:remote at Dr. Delanna Ahmadi office,  willing to get  Prior vaccinations: TD or Tdap:2016 Influenza: 2018 Pneumococcal: 03/2014 Prevnar13: Due 03/2015 Shingles/Zostavax: declines  Names of Other Physician/Practitioners you currently use: 1. [facility]- here for primary care 2. Dr. Delman Cheadle, eye doctor, last visit 3 years 3. , dentist, last visit 3 years ago, has dentures  Patient Care Team: [name], MD as PCP - General (Internal Medicine)  Allergies Allergies  Allergen Reactions  . Sulfa Antibiotics Other (See Comments)  . Asa [Aspirin] Other (See Comments)    Take 81mg  cannot take 325mg  gets nervous  . Codeine Nausea Only  . Prednisone Nausea And Vomiting    SURGICAL HISTORY She  has a past surgical history that includes Cataract extraction; Vericose vein (1960); MACULAR TEAR; and Eye surgery. FAMILY HISTORY Her family history includes Colon polyps in her sister; Diabetes in her sister; Heart disease in her brother, father, and mother; Hypertension in her son; Stroke in her brother, mother, and sister. SOCIAL HISTORY She  reports that she quit smoking about 54 years ago. She has a 15.00 pack-year smoking history. She has never used smokeless tobacco. She reports that she does not drink alcohol or use drugs.  MEDICARE WELLNESS OBJECTIVES: Physical activity: yard work and walking Depression/mood screen:   Depression screen [facility] 2/9 08/22/2019  Decreased Interest 0  Down, Depressed, Hopeless 0  PHQ - 2 Score 0   ADLs:  In your present state of health, do you have any difficulty performing the following activities: 08/22/2019 01/05/2019  Hearing? N N  Vision? N N  Difficulty concentrating or making decisions? N N  Walking or climbing stairs? N N  Dressing or bathing? N N  Doing errands, shopping? N N  Some recent data might be hidden    Cognitive Testing  Alert? Yes  Normal Appearance?Yes  Oriented to person? Yes  Place? Yes   Time? Yes  Recall of three  objects?  Yes  Can perform simple calculations? Yes  Displays appropriate judgment?Yes  Can read the correct time from a watch face?Yes  EOL planning:   No, declined information   Objective:   There were no vitals taken for this visit. There is no height or weight on file to calculate BMI.  General appearance: alert, no distress, WD/WN,  female HEENT: normocephalic, sclerae anicteric, TMs pearly, nares patent, no discharge or erythema, pharynx normal Oral cavity: MMM, no lesions Neck: supple, no lymphadenopathy, no thyromegaly, no masses Heart: RRR, normal S1, S2, no murmurs Lungs:  CTA bilaterally, no wheezes, rhonchi, or rales Abdomen: +bs, soft, non tender, non distended, no masses, no hepatomegaly, no splenomegaly Musculoskeletal: nontender, no swelling, no obvious deformity Extremities:  no cyanosis, no clubbing; she has 2+ edema to feet, ankles, lower legs bilaterally Pulses: 2+ symmetric, upper and lower extremities, normal cap refill Neurological: alert, oriented x 3, CN2-12 intact, strength normal upper extremities and lower extremities, sensation normal throughout, DTRs 2+ throughout, no cerebellar signs, gait normal Psychiatric: normal affect, behavior normal, pleasant   Medicare Attestation I have personally reviewed: The patient's medical and social history Their use of alcohol, tobacco or illicit drugs Their current medications and supplements The patient's functional ability including ADLs,fall risks, home safety risks, cognitive, and hearing and visual impairment Diet and physical activities Evidence for depression or mood disorders  The patient's weight, height, BMI, and visual acuity have been recorded in the chart.  I have made referrals, counseling, and provided education to the patient based on review of the above and I have provided the patient with a written personalized care plan for preventive services.     Vicie Mutters, PA-C   10/29/2019

## 2019-10-30 ENCOUNTER — Ambulatory Visit: Payer: Medicare HMO | Admitting: Physician Assistant

## 2019-11-11 ENCOUNTER — Other Ambulatory Visit: Payer: Self-pay | Admitting: Internal Medicine

## 2019-11-24 DIAGNOSIS — H2512 Age-related nuclear cataract, left eye: Secondary | ICD-10-CM | POA: Diagnosis not present

## 2019-11-24 DIAGNOSIS — H25812 Combined forms of age-related cataract, left eye: Secondary | ICD-10-CM | POA: Diagnosis not present

## 2019-11-24 DIAGNOSIS — H25012 Cortical age-related cataract, left eye: Secondary | ICD-10-CM | POA: Diagnosis not present

## 2019-12-22 ENCOUNTER — Other Ambulatory Visit: Payer: Self-pay | Admitting: Physician Assistant

## 2019-12-24 ENCOUNTER — Telehealth: Payer: Self-pay | Admitting: *Deleted

## 2019-12-24 ENCOUNTER — Ambulatory Visit: Payer: Medicare HMO | Admitting: Podiatry

## 2019-12-24 ENCOUNTER — Other Ambulatory Visit: Payer: Self-pay

## 2019-12-24 VITALS — Temp 95.7°F

## 2019-12-24 DIAGNOSIS — G629 Polyneuropathy, unspecified: Secondary | ICD-10-CM

## 2019-12-24 DIAGNOSIS — R609 Edema, unspecified: Secondary | ICD-10-CM

## 2019-12-24 DIAGNOSIS — M722 Plantar fascial fibromatosis: Secondary | ICD-10-CM | POA: Diagnosis not present

## 2019-12-24 NOTE — Telephone Encounter (Signed)
-----   Message from Trula Slade, DPM sent at 12/24/2019 11:41 AM EDT ----- Can you please put in referral for [name] for left knee pain? Thanks.

## 2019-12-24 NOTE — Patient Instructions (Signed)
For instructions on how to put on your Night Splint, please visit [email or web address]   Plantar Fasciitis (Heel Spur Syndrome) with Rehab The plantar fascia is a fibrous, ligament-like, soft-tissue structure that spans the bottom of the foot. Plantar fasciitis is a condition that causes pain in the foot due to inflammation of the tissue. SYMPTOMS   Pain and tenderness on the underneath side of the foot.  Pain that worsens with standing or walking. CAUSES  Plantar fasciitis is caused by irritation and injury to the plantar fascia on the underneath side of the foot. Common mechanisms of injury include:  Direct trauma to bottom of the foot.  Damage to a small nerve that runs under the foot where the main fascia attaches to the heel bone.  Stress placed on the plantar fascia due to bone spurs. RISK INCREASES WITH:   Activities that place stress on the plantar fascia (running, jumping, pivoting, or cutting).  Poor strength and flexibility.  Improperly fitted shoes.  Tight calf muscles.  Flat feet.  Failure to warm-up properly before activity.  Obesity. PREVENTION  Warm up and stretch properly before activity.  Allow for adequate recovery between workouts.  Maintain physical fitness:  Strength, flexibility, and endurance.  Cardiovascular fitness.  Maintain a health body weight.  Avoid stress on the plantar fascia.  Wear properly fitted shoes, including arch supports for individuals who have flat feet.  PROGNOSIS  If treated properly, then the symptoms of plantar fasciitis usually resolve without surgery. However, occasionally surgery is necessary.  RELATED COMPLICATIONS   Recurrent symptoms that may result in a chronic condition.  Problems of the lower back that are caused by compensating for the injury, such as limping.  Pain or weakness of the foot during push-off following surgery.  Chronic inflammation, scarring, and partial or complete fascia tear,  occurring more often from repeated injections.  TREATMENT  Treatment initially involves the use of ice and medication to help reduce pain and inflammation. The use of strengthening and stretching exercises may help reduce pain with activity, especially stretches of the Achilles tendon. These exercises may be performed at home or with a therapist. Your caregiver may recommend that you use heel cups of arch supports to help reduce stress on the plantar fascia. Occasionally, corticosteroid injections are given to reduce inflammation. If symptoms persist for greater than 6 months despite non-surgical (conservative), then surgery may be recommended.   MEDICATION   If pain medication is necessary, then nonsteroidal anti-inflammatory medications, such as aspirin and ibuprofen, or other minor pain relievers, such as acetaminophen, are often recommended.  Do not take pain medication within 7 days before surgery.  Prescription pain relievers may be given if deemed necessary by your caregiver. Use only as directed and only as much as you need.  Corticosteroid injections may be given by your caregiver. These injections should be reserved for the most serious cases, because they may only be given a certain number of times.  HEAT AND COLD  Cold treatment (icing) relieves pain and reduces inflammation. Cold treatment should be applied for 10 to 15 minutes every 2 to 3 hours for inflammation and pain and immediately after any activity that aggravates your symptoms. Use ice packs or massage the area with a piece of ice (ice massage).  Heat treatment may be used prior to performing the stretching and strengthening activities prescribed by your caregiver, physical therapist, or athletic trainer. Use a heat pack or soak the injury in warm water.  SEEK IMMEDIATE MEDICAL CARE   IF:  Treatment seems to offer no benefit, or the condition worsens.  Any medications produce adverse side effects.  EXERCISES- RANGE OF  MOTION (ROM) AND STRETCHING EXERCISES - Plantar Fasciitis (Heel Spur Syndrome) These exercises may help you when beginning to rehabilitate your injury. Your symptoms may resolve with or without further involvement from your physician, physical therapist or athletic trainer. While completing these exercises, remember:   Restoring tissue flexibility helps normal motion to return to the joints. This allows healthier, less painful movement and activity.  An effective stretch should be held for at least 30 seconds.  A stretch should never be painful. You should only feel a gentle lengthening or release in the stretched tissue.  RANGE OF MOTION - Toe Extension, Flexion  Sit with your right / left leg crossed over your opposite knee.  Grasp your toes and gently pull them back toward the top of your foot. You should feel a stretch on the bottom of your toes and/or foot.  Hold this stretch for 10 seconds.  Now, gently pull your toes toward the bottom of your foot. You should feel a stretch on the top of your toes and or foot.  Hold this stretch for 10 seconds. Repeat  times. Complete this stretch 3 times per day.   RANGE OF MOTION - Ankle Dorsiflexion, Active Assisted  Remove shoes and sit on a chair that is preferably not on a carpeted surface.  Place right / left foot under knee. Extend your opposite leg for support.  Keeping your heel down, slide your right / left foot back toward the chair until you feel a stretch at your ankle or calf. If you do not feel a stretch, slide your bottom forward to the edge of the chair, while still keeping your heel down.  Hold this stretch for 10 seconds. Repeat 3 times. Complete this stretch 2 times per day.   STRETCH  Gastroc, Standing  Place hands on wall.  Extend right / left leg, keeping the front knee somewhat bent.  Slightly point your toes inward on your back foot.  Keeping your right / left heel on the floor and your knee straight, shift  your weight toward the wall, not allowing your back to arch.  You should feel a gentle stretch in the right / left calf. Hold this position for 10 seconds. Repeat 3 times. Complete this stretch 2 times per day.  STRETCH  Soleus, Standing  Place hands on wall.  Extend right / left leg, keeping the other knee somewhat bent.  Slightly point your toes inward on your back foot.  Keep your right / left heel on the floor, bend your back knee, and slightly shift your weight over the back leg so that you feel a gentle stretch deep in your back calf.  Hold this position for 10 seconds. Repeat 3 times. Complete this stretch 2 times per day.  STRETCH  Gastrocsoleus, Standing  Note: This exercise can place a lot of stress on your foot and ankle. Please complete this exercise only if specifically instructed by your caregiver.   Place the ball of your right / left foot on a step, keeping your other foot firmly on the same step.  Hold on to the wall or a rail for balance.  Slowly lift your other foot, allowing your body weight to press your heel down over the edge of the step.  You should feel a stretch in your right / left calf.  Hold this position   for 10 seconds.  Repeat this exercise with a slight bend in your right / left knee. Repeat 3 times. Complete this stretch 2 times per day.   STRENGTHENING EXERCISES - Plantar Fasciitis (Heel Spur Syndrome)  These exercises may help you when beginning to rehabilitate your injury. They may resolve your symptoms with or without further involvement from your physician, physical therapist or athletic trainer. While completing these exercises, remember:   Muscles can gain both the endurance and the strength needed for everyday activities through controlled exercises.  Complete these exercises as instructed by your physician, physical therapist or athletic trainer. Progress the resistance and repetitions only as guided.  STRENGTH - Towel Curls  Sit in  a chair positioned on a non-carpeted surface.  Place your foot on a towel, keeping your heel on the floor.  Pull the towel toward your heel by only curling your toes. Keep your heel on the floor. Repeat 3 times. Complete this exercise 2 times per day.  STRENGTH - Ankle Inversion  Secure one end of a rubber exercise band/tubing to a fixed object (table, pole). Loop the other end around your foot just before your toes.  Place your fists between your knees. This will focus your strengthening at your ankle.  Slowly, pull your big toe up and in, making sure the band/tubing is positioned to resist the entire motion.  Hold this position for 10 seconds.  Have your muscles resist the band/tubing as it slowly pulls your foot back to the starting position. Repeat 3 times. Complete this exercises 2 times per day.  Document Released: 05/28/2005 Document Revised: 08/20/2011 Document Reviewed: 09/09/2008 [employer] Patient Information 2014 [employer].  

## 2019-12-28 ENCOUNTER — Telehealth: Payer: Self-pay | Admitting: Podiatry

## 2019-12-28 DIAGNOSIS — M25562 Pain in left knee: Secondary | ICD-10-CM

## 2019-12-28 NOTE — Telephone Encounter (Signed)
Pt was seen in office last week and was told that Dr Jacqualyn Posey would send a referral to [facility] for her left knee. Pt calling to follow up.

## 2019-12-29 NOTE — Telephone Encounter (Signed)
I received clinicals requested from Dr. Jacqualyn Posey, and faxed with referral, demographics to [facility].

## 2019-12-29 NOTE — Addendum Note (Signed)
Addended by: Harriett Sine D on: 12/29/2019 11:55 AM   Modules accepted: Orders

## 2019-12-29 NOTE — Progress Notes (Signed)
Subjective: 86 year old female presents the office today for evaluation of bilateral foot pain.  She states that she still gets numbness to her feet and legs she is also soaking discomfort to the bottom of her heels.  She denies any recent injury or trauma otherwise.  She is complaining of knee pain on her left side and is asking for referral. Denies any systemic complaints such as fevers, chills, nausea, vomiting. No acute changes since last appointment, and no other complaints at this time.   Objective: AAO x3, NAD DP/PT pulses palpable bilaterally, CRT less than 3 seconds There is still tenderness palpation along the plantar medial tubercle of the calcaneus with insertion of plantar fascia.  Plantar fascial peers be intact.  No significant discomfort of the Achilles tendon.  There is no pain with lateral compression of calcaneus.  Varicose veins are present and chronic bilateral lower extremity edema but there is no erythema open sores or fluctuation. No open lesions or pre-ulcerative lesions.  No pain with calf compression, swelling, warmth, erythema  Assessment: 86 year old female with plantar fasciitis, concern for neuropathy  Plan: -All treatment options discussed with the patient including all alternatives, risks, complications.  -Regards to the plantar fasciitis we discussed steroid injection but she declines.  Discussed ice, shoe modifications.  Dispensed a night splint.  [facility] physical therapy for [facility]. -Also discussed the likely component of neuropathy.  We will continue to monitor this. -Referral to [facility] for knee pain -Patient encouraged to call the office with any questions, concerns, change in symptoms.   Trula Slade DPM

## 2019-12-29 NOTE — Telephone Encounter (Signed)
I informed pt I had received the clinicals for her last office visit and would be faxing the referral to [facility] in just a few minutes.

## 2020-02-01 ENCOUNTER — Other Ambulatory Visit: Payer: Self-pay | Admitting: Internal Medicine

## 2020-02-01 DIAGNOSIS — E039 Hypothyroidism, unspecified: Secondary | ICD-10-CM

## 2020-02-01 MED ORDER — SYNTHROID 150 MCG PO TABS: ORAL_TABLET | ORAL | 0 refills | Status: DC

## 2020-02-03 ENCOUNTER — Other Ambulatory Visit: Payer: Self-pay | Admitting: Internal Medicine

## 2020-02-03 DIAGNOSIS — E039 Hypothyroidism, unspecified: Secondary | ICD-10-CM

## 2020-02-03 MED ORDER — SYNTHROID 150 MCG PO TABS: ORAL_TABLET | ORAL | 0 refills | Status: DC

## 2020-02-08 ENCOUNTER — Encounter: Payer: Self-pay | Admitting: Internal Medicine

## 2020-02-08 NOTE — Patient Instructions (Signed)
Due to recent changes in healthcare laws, you may see the results of your imaging and laboratory studies on MyChart before your provider has had a chance to review them.  We understand that in some cases there may be results that are confusing or concerning to you. Not all laboratory results come back in the same time frame and the provider may be waiting for multiple results in order to interpret others.  Please give [place] 48 hours in order for your provider to thoroughly review all the results before contacting the office for clarification of your results.   +++++++++++++++++++++++++  Vit D  & Vit C 1,000 mg   are recommended to help protect  against the Covid-19 and other Corona viruses.    Also it's recommended  to take  Zinc 50 mg  to help  protect against the Covid-19   and best place to get  is also on [email or web address]  and don't pay more than 6-8 cents /pill !  ================================ Coronavirus (COVID-19) Are you at risk?  Are you at risk for the Coronavirus (COVID-19)?  To be considered HIGH RISK for Coronavirus (COVID-19), you have to meet the following criteria:  . Traveled to [place], [place], [place], [place] or [place]; or in the [place] to [place], [place], [place]  . or [place]; and have fever, cough, and shortness of breath within the last 2 weeks of travel OR . Been in close contact with a person diagnosed with COVID-19 within the last 2 weeks and have  . fever, cough,and shortness of breath .  . IF YOU DO NOT MEET THESE CRITERIA, YOU ARE CONSIDERED LOW RISK FOR COVID-19.  What to do if you are HIGH RISK for COVID-19?  Marland Kitchen If you are having a medical emergency, call 911. . Seek medical care right away. Before you go to a doctor's office, urgent care or emergency department, .  call ahead and tell them about your recent travel, contact with someone diagnosed with COVID-19  .  and your symptoms.  . You should receive instructions from your physician's  office regarding next steps of care.  . When you arrive at healthcare provider, tell the healthcare staff immediately you have returned from  . visiting [place], [place], [place], [place] or [place]; or traveled in the [place] to [place], [place],  . [place] or [place] in the last two weeks or you have been in close contact with a person diagnosed with  . COVID-19 in the last 2 weeks.   . Tell the health care staff about your symptoms: fever, cough and shortness of breath. . After you have been seen by a medical provider, you will be either: o Tested for (COVID-19) and discharged home on quarantine except to seek medical care if  o symptoms worsen, and asked to  - Stay home and avoid contact with others until you get your results (4-5 days)  - Avoid travel on public transportation if possible (such as bus, train, or airplane) or o Sent to the Emergency Department by EMS for evaluation, COVID-19 testing  and  o possible admission depending on your condition and test results.  What to do if you are LOW RISK for COVID-19?  Reduce your risk of any infection by using the same precautions used for avoiding the common cold or flu:  Marland Kitchen Wash your hands often with soap and warm water for at least 20 seconds.  If soap and water are not readily  available,  . use an alcohol-based hand sanitizer with at least 60% alcohol.  . If coughing or sneezing, cover your mouth and nose by coughing or sneezing into the elbow areas of your shirt or coat, .  into a tissue or into your sleeve (not your hands). . Avoid shaking hands with others and consider head nods or verbal greetings only. . Avoid touching your eyes, nose, or mouth with unwashed hands.  . Avoid close contact with people who are sick. . Avoid places or events with large numbers of people in one location, like concerts or sporting events. . Carefully consider travel plans you have or are making. . If you are planning any travel outside or  inside the [place], visit the CDC's Travelers' Health webpage for the latest health notices. . If you have some symptoms but not all symptoms, continue to monitor at home and seek medical attention  . if your symptoms worsen. . If you are having a medical emergency, call 911.   . >>>>>>>>>>>>>>>>>>>>>>>>>>>>>>>>> . We Do NOT Approve of  [facility], [employer]  Our Patients  To Do Home Visits & We Do NOT Approve of [employer] > > > > > > > > > > > > > > > > > > > > > > > > > > > > > > > > > > > > > > >  Preventive Care for Adults  A healthy lifestyle and preventive care can promote health and wellness. Preventive health guidelines for women include the following key practices.  A routine yearly physical is a good way to check with your health care provider about your health and preventive screening. It is a chance to share any concerns and updates on your health and to receive a thorough exam.  Visit your dentist for a routine exam and preventive care every 6 months. Brush your teeth twice a day and floss once a day. Good oral hygiene prevents tooth decay and gum disease.  The frequency of eye exams is based on your age, health, family medical history, use of contact lenses, and other factors. Follow your health care provider's recommendations for frequency of eye exams.  Eat a healthy diet. Foods like vegetables, fruits, whole grains, low-fat dairy products, and lean protein foods contain the nutrients you need without too many calories. Decrease your intake of foods high in solid fats, added sugars, and salt. Eat the right amount of calories for you. Get information about a proper diet from your health care provider, if necessary.  Regular physical exercise is one of the most important things you can do for your health. Most adults should get at least 150 minutes of moderate-intensity exercise (any activity that increases your heart rate and causes you to sweat) each  week. In addition, most adults need muscle-strengthening exercises on 2 or more days a week.  Maintain a healthy weight. The body mass index (BMI) is a screening tool to identify possible weight problems. It provides an estimate of body fat based on height and weight. Your health care provider can find your BMI and can help you achieve or maintain a healthy weight. For adults 20 years and older:  A BMI below 18.5 is considered underweight.  A BMI of 18.5 to 24.9 is normal.  A BMI of 25 to 29.9 is considered overweight.  A BMI of 30 and above is considered obese.  Maintain normal blood lipids and cholesterol levels by exercising and minimizing your intake of  saturated fat. Eat a balanced diet with plenty of fruit and vegetables. If your lipid or cholesterol levels are high, you are over 50, or you are at high risk for heart disease, you may need your cholesterol levels checked more frequently. Ongoing high lipid and cholesterol levels should be treated with medicines if diet and exercise are not working.  If you smoke, find out from your health care provider how to quit. If you do not use tobacco, do not start.  Lung cancer screening is recommended for adults aged 78-80 years who are at high risk for developing lung cancer because of a history of smoking. A yearly low-dose CT scan of the lungs is recommended for people who have at least a 30-pack-year history of smoking and are a current smoker or have quit within the past 15 years. A pack year of smoking is smoking an average of 1 pack of cigarettes a day for 1 year (for example: 1 pack a day for 30 years or 2 packs a day for 15 years). Yearly screening should continue until the smoker has stopped smoking for at least 15 years. Yearly screening should be stopped for people who develop a health problem that would prevent them from having lung cancer treatment.  Avoid use of street drugs. Do not share needles with anyone. Ask for help if you need  support or instructions about stopping the use of drugs.  High blood pressure causes heart disease and increases the risk of stroke.  Ongoing high blood pressure should be treated with medicines if weight loss and exercise do not work.  If you are 14-45 years old, ask your health care provider if you should take aspirin to prevent strokes.  Diabetes screening involves taking a blood sample to check your fasting blood sugar level. This should be done once every 3 years, after age 40, if you are within normal weight and without risk factors for diabetes. Testing should be considered at a younger age or be carried out more frequently if you are overweight and have at least 1 risk factor for diabetes.  Breast cancer screening is essential preventive care for women. You should practice "breast self-awareness." This means understanding the normal appearance and feel of your breasts and may include breast self-examination. Any changes detected, no matter how small, should be reported to a health care provider. Women in their 18s and 30s should have a clinical breast exam (CBE) by a health care provider as part of a regular health exam every 1 to 3 years. After age 71, women should have a CBE every year. Starting at age 39, women should consider having a mammogram (breast X-ray test) every year. Women who have a family history of breast cancer should talk to their health care provider about genetic screening. Women at a high risk of breast cancer should talk to their health care providers about having an MRI and a mammogram every year.  Breast cancer gene (BRCA)-related cancer risk assessment is recommended for women who have family members with BRCA-related cancers. BRCA-related cancers include breast, ovarian, tubal, and peritoneal cancers. Having family members with these cancers may be associated with an increased risk for harmful changes (mutations) in the breast cancer genes BRCA1 and BRCA2. Results of the  assessment will determine the need for genetic counseling and BRCA1 and BRCA2 testing.  Routine pelvic exams to screen for cancer are no longer recommended for nonpregnant women who are considered low risk for cancer of the pelvic organs (ovaries,  uterus, and vagina) and who do not have symptoms. Ask your health care provider if a screening pelvic exam is right for you.  If you have had past treatment for cervical cancer or a condition that could lead to cancer, you need Pap tests and screening for cancer for at least 20 years after your treatment. If Pap tests have been discontinued, your risk factors (such as having a new sexual partner) need to be reassessed to determine if screening should be resumed. Some women have medical problems that increase the chance of getting cervical cancer. In these cases, your health care provider may recommend more frequent screening and Pap tests.    Colorectal cancer can be detected and often prevented. Most routine colorectal cancer screening begins at the age of 62 years and continues through age 28 years. However, your health care provider may recommend screening at an earlier age if you have risk factors for colon cancer. On a yearly basis, your health care provider may provide home test kits to check for hidden blood in the stool. Use of a small camera at the end of a tube, to directly examine the colon (sigmoidoscopy or colonoscopy), can detect the earliest forms of colorectal cancer. Talk to your health care provider about this at age 23, when routine screening begins.  Direct exam of the colon should be repeated every 5-10 years through age 31 years, unless early forms of pre-cancerous polyps or small growths are found.  Osteoporosis is a disease in which the bones lose minerals and strength with aging. This can result in serious bone fractures or breaks. The risk of osteoporosis can be identified using a bone density scan. Women ages 83 years and over and women  at risk for fractures or osteoporosis should discuss screening with their health care providers. Ask your health care provider whether you should take a calcium supplement or vitamin D to reduce the rate of osteoporosis.  Menopause can be associated with physical symptoms and risks. Hormone replacement therapy is available to decrease symptoms and risks. You should talk to your health care provider about whether hormone replacement therapy is right for you.  Use sunscreen. Apply sunscreen liberally and repeatedly throughout the day. You should seek shade when your shadow is shorter than you. Protect yourself by wearing long sleeves, pants, a wide-brimmed hat, and sunglasses year round, whenever you are outdoors.  Once a month, do a whole body skin exam, using a mirror to look at the skin on your back. Tell your health care provider of new moles, moles that have irregular borders, moles that are larger than a pencil eraser, or moles that have changed in shape or color.  Stay current with required vaccines (immunizations).  Influenza vaccine. All adults should be immunized every year.  Tetanus, diphtheria, and acellular pertussis (Td, Tdap) vaccine. Pregnant women should receive 1 dose of Tdap vaccine during each pregnancy. The dose should be obtained regardless of the length of time since the last dose. Immunization is preferred during the 27th-36th week of gestation. An adult who has not previously received Tdap or who does not know her vaccine status should receive 1 dose of Tdap. This initial dose should be followed by tetanus and diphtheria toxoids (Td) booster doses every 10 years. Adults with an unknown or incomplete history of completing a 3-dose immunization series with Td-containing vaccines should begin or complete a primary immunization series including a Tdap dose. Adults should receive a Td booster every 10 years.  Zoster vaccine. One dose is recommended for adults aged 64 years or  older unless certain conditions are present.    Pneumococcal 13-valent conjugate (PCV13) vaccine. When indicated, a person who is uncertain of her immunization history and has no record of immunization should receive the PCV13 vaccine. An adult aged 61 years or older who has certain medical conditions and has not been previously immunized should receive 1 dose of PCV13 vaccine. This PCV13 should be followed with a dose of pneumococcal polysaccharide (PPSV23) vaccine. The PPSV23 vaccine dose should be obtained at least 1 or more year(s) after the dose of PCV13 vaccine. An adult aged 46 years or older who has certain medical conditions and previously received 1 or more doses of PPSV23 vaccine should receive 1 dose of PCV13. The PCV13 vaccine dose should be obtained 1 or more years after the last PPSV23 vaccine dose.    Pneumococcal polysaccharide (PPSV23) vaccine. When PCV13 is also indicated, PCV13 should be obtained first. All adults aged 58 years and older should be immunized. An adult younger than age 1 years who has certain medical conditions should be immunized. Any person who resides in a nursing home or long-term care facility should be immunized. An adult smoker should be immunized. People with an immunocompromised condition and certain other conditions should receive both PCV13 and PPSV23 vaccines. People with human immunodeficiency virus (HIV) infection should be immunized as soon as possible after diagnosis. Immunization during chemotherapy or radiation therapy should be avoided. Routine use of PPSV23 vaccine is not recommended for American Indians, [place] Natives, or people younger than 65 years unless there are medical conditions that require PPSV23 vaccine. When indicated, people who have unknown immunization and have no record of immunization should receive PPSV23 vaccine. One-time revaccination 5 years after the first dose of PPSV23 is recommended for people aged 19-64 years who have chronic  kidney failure, nephrotic syndrome, asplenia, or immunocompromised conditions. People who received 1-2 doses of PPSV23 before age 11 years should receive another dose of PPSV23 vaccine at age 70 years or later if at least 5 years have passed since the previous dose. Doses of PPSV23 are not needed for people immunized with PPSV23 at or after age 65 years.   Preventive Services / Frequency  Ages 56 years and over  Blood pressure check.  Lipid and cholesterol check.  Lung cancer screening. / Every year if you are aged 16-80 years and have a 30-pack-year history of smoking and currently smoke or have quit within the past 15 years. Yearly screening is stopped once you have quit smoking for at least 15 years or develop a health problem that would prevent you from having lung cancer treatment.  Clinical breast exam.** / Every year after age 59 years.   BRCA-related cancer risk assessment.** / For women who have family members with a BRCA-related cancer (breast, ovarian, tubal, or peritoneal cancers).  Mammogram.** / Every year beginning at age 70 years and continuing for as long as you are in good health. Consult with your health care provider.  Pap test.** / Every 3 years starting at age 32 years through age 28 or 9 years with 3 consecutive normal Pap tests. Testing can be stopped between 65 and 70 years with 3 consecutive normal Pap tests and no abnormal Pap or HPV tests in the past 10 years.  Fecal occult blood test (FOBT) of stool. / Every year beginning at age 57 years and continuing until age 29 years. You may not need to  do this test if you get a colonoscopy every 10 years.  Flexible sigmoidoscopy or colonoscopy.** / Every 5 years for a flexible sigmoidoscopy or every 10 years for a colonoscopy beginning at age 69 years and continuing until age 75 years.  Hepatitis C blood test.** / For all people born from [birth year] through 1963 and any individual with known risks for hepatitis  C.  Osteoporosis screening.** / A one-time screening for women ages 39 years and over and women at risk for fractures or osteoporosis.  Skin self-exam. / Monthly.  Influenza vaccine. / Every year.  Tetanus, diphtheria, and acellular pertussis (Tdap/Td) vaccine.** / 1 dose of Td every 10 years.  Zoster vaccine.** / 1 dose for adults aged 32 years or older.  Pneumococcal 13-valent conjugate (PCV13) vaccine.** / Consult your health care provider.  Pneumococcal polysaccharide (PPSV23) vaccine.** / 1 dose for all adults aged 49 years and older. Screening for abdominal aortic aneurysm (AAA)  by ultrasound is recommended for people who have history of high blood pressure or who are current or former smokers. ++++++++++++++++++++ Recommend Adult Low Dose Aspirin or  coated  Aspirin 81 mg daily  To reduce risk of Colon Cancer 40 %,  Skin Cancer 26 % ,  Melanoma 46%  and  Pancreatic cancer 60% ++++++++++++++++++++ Vitamin D goal  is between 70-100.  Please make sure that you are taking your Vitamin D as directed.  It is very important as a natural anti-inflammatory  helping hair, skin, and nails, as well as reducing stroke and heart attack risk.  It helps your bones and helps with mood. It also decreases numerous cancer risks so please take it as directed.  Low Vit D is associated with a 200-300% higher risk for CANCER  and 200-300% higher risk for HEART   ATTACK  &  STROKE.   .....................................Marland Kitchen It is also associated with higher death rate at younger ages,  autoimmune diseases like Rheumatoid arthritis, Lupus, Multiple Sclerosis.    Also many other serious conditions, like depression, Alzheimer's Dementia, infertility, muscle aches, fatigue, fibromyalgia - just to name a few. ++++++++++++++++++ Recommend the book "The END of DIETING" by Dr Excell Seltzer  & the book "The END of DIABETES " by Dr Excell Seltzer At [email or web address] - get book & Audio CD's    Being diabetic has  a  300% increased risk for heart attack, stroke, cancer, and alzheimer- type vascular dementia. It is very important that you work harder with diet by avoiding all foods that are white. Avoid white rice (brown & wild rice is OK), white potatoes (sweetpotatoes in moderation is OK), White bread or wheat bread or anything made out of white flour like bagels, donuts, rolls, buns, biscuits, cakes, pastries, cookies, pizza crust, and pasta (made from white flour & egg whites) - vegetarian pasta or spinach or wheat pasta is OK. Multigrain breads like Arnold's or [employer], or multigrain sandwich thins or flatbreads.  Diet, exercise and weight loss can reverse and cure diabetes in the early stages.  Diet, exercise and weight loss is very important in the control and prevention of complications of diabetes which affects every system in your body, ie. Brain - dementia/stroke, eyes - glaucoma/blindness, heart - heart attack/heart failure, kidneys - dialysis, stomach - gastric paralysis, intestines - malabsorption, nerves - severe painful neuritis, circulation - gangrene & loss of a leg(s), and finally cancer and Alzheimers.    I recommend avoid fried & greasy foods,  sweets/candy, white rice (brown or wild  rice or Quinoa is OK), white potatoes (sweet potatoes are OK) - anything made from white flour - bagels, doughnuts, rolls, buns, biscuits,white and wheat breads, pizza crust and traditional pasta made of white flour & egg white(vegetarian pasta or spinach or wheat pasta is OK).  Multi-grain bread is OK - like multi-grain flat bread or sandwich thins. Avoid alcohol in excess. Exercise is also important.    Eat all the vegetables you want - avoid meat, especially red meat and dairy - especially cheese.  Cheese is the most concentrated form of trans-fats which is the worst thing to clog up our arteries. Veggie cheese is OK which can be found in the fresh produce section at [employer] or [employer] or  [employer]  +++++++++++++++++++ DASH Eating Plan  DASH stands for "Dietary Approaches to Stop Hypertension."   The DASH eating plan is a healthy eating plan that has been shown to reduce high blood pressure (hypertension). Additional health benefits may include reducing the risk of type 2 diabetes mellitus, heart disease, and stroke. The DASH eating plan may also help with weight loss. WHAT DO I NEED TO KNOW ABOUT THE DASH EATING PLAN? For the DASH eating plan, you will follow these general guidelines:  Choose foods with a percent daily value for sodium of less than 5% (as listed on the food label).  Use salt-free seasonings or herbs instead of table salt or sea salt.  Check with your health care provider or pharmacist before using salt substitutes.  Eat lower-sodium products, often labeled as "lower sodium" or "no salt added."  Eat fresh foods.  Eat more vegetables, fruits, and low-fat dairy products.  Choose whole grains. Look for the word "whole" as the first word in the ingredient list.  Choose fish   Limit sweets, desserts, sugars, and sugary drinks.  Choose heart-healthy fats.  Eat veggie cheese   Eat more home-cooked food and less restaurant, buffet, and fast food.  Limit fried foods.  Cook foods using methods other than frying.  Limit canned vegetables. If you do use them, rinse them well to decrease the sodium.  When eating at a restaurant, ask that your food be prepared with less salt, or no salt if possible.                      WHAT FOODS CAN I EAT? Read Dr Fara Olden Fuhrman's books on The End of Dieting & The End of Diabetes  Grains Whole grain or whole wheat bread. Brown rice. Whole grain or whole wheat pasta. Quinoa, bulgur, and whole grain cereals. Low-sodium cereals. Corn or whole wheat flour tortillas. Whole grain cornbread. Whole grain crackers. Low-sodium crackers.  Vegetables Fresh or frozen vegetables (raw, steamed, roasted, or grilled). Low-sodium or  reduced-sodium tomato and vegetable juices. Low-sodium or reduced-sodium tomato sauce and paste. Low-sodium or reduced-sodium canned vegetables.   Fruits All fresh, canned (in natural juice), or frozen fruits.  Protein Products  All fish and seafood.  Dried beans, peas, or lentils. Unsalted nuts and seeds. Unsalted canned beans.  Dairy Low-fat dairy products, such as skim or 1% milk, 2% or reduced-fat cheeses, low-fat ricotta or cottage cheese, or plain low-fat yogurt. Low-sodium or reduced-sodium cheeses.  Fats and Oils Tub margarines without trans fats. Light or reduced-fat mayonnaise and salad dressings (reduced sodium). Avocado. Safflower, olive, or canola oils. Natural peanut or almond butter.  Other Unsalted popcorn and pretzels. The items listed above may not be a complete list of recommended foods  or beverages. Contact your dietitian for more options.  +++++++++++++++  WHAT FOODS ARE NOT RECOMMENDED? Grains/ White flour or wheat flour White bread. White pasta. White rice. Refined cornbread. Bagels and croissants. Crackers that contain trans fat.  Vegetables  Creamed or fried vegetables. Vegetables in a . Regular canned vegetables. Regular canned tomato sauce and paste. Regular tomato and vegetable juices.  Fruits Dried fruits. Canned fruit in light or heavy syrup. Fruit juice.  Meat and Other Protein Products Meat in general - RED meat & White meat.  Fatty cuts of meat. Ribs, chicken wings, all processed meats as bacon, sausage, bologna, salami, fatback, hot dogs, bratwurst and packaged luncheon meats.  Dairy Whole or 2% milk, cream, half-and-half, and cream cheese. Whole-fat or sweetened yogurt. Full-fat cheeses or blue cheese. Non-dairy creamers and whipped toppings. Processed cheese, cheese spreads, or cheese curds.  Condiments Onion and garlic salt, seasoned salt, table salt, and sea salt. Canned and packaged gravies. Worcestershire sauce. Tartar sauce. Barbecue  sauce. Teriyaki sauce. Soy sauce, including reduced sodium. Steak sauce. Fish sauce. Oyster sauce. Cocktail sauce. Horseradish. Ketchup and mustard. Meat flavorings and tenderizers. Bouillon cubes. Hot sauce. Tabasco sauce. Marinades. Taco seasonings. Relishes.  Fats and Oils Butter, stick margarine, lard, shortening and bacon fat. Coconut, palm kernel, or palm oils. Regular salad dressings.  Pickles and olives. Salted popcorn and pretzels.  The items listed above may not be a complete list of foods and beverages to avoid.

## 2020-02-08 NOTE — Progress Notes (Signed)
Annual Screening/Preventative Visit & Comprehensive Evaluation &  Examination     This very nice 86 y.o.  WWF presents for a Screening /Preventative Visit & comprehensive evaluation and management of multiple medical co-morbidities.  Patient has been followed for HTN, HLD, Hypothyroidism, Prediabetes  and Vitamin D Deficiency.       HTN predates since  31. Patient's BP has been controlled at home and patient denies any cardiac symptoms as chest pain, palpitations, shortness of breath, dizziness or ankle swelling.  Pasient has CKD3b  (GFR 41) consequent of her AS & HTCVD. Today's BP is at goal - 124/78.       Patient's hyperlipidemia is controlled with diet and medications. Patient denies myalgias or other medication SE's. Last lipids were at goal:   Lab Results  Component Value Date   CHOL 142 07/13/2019   HDL 38 (L) 07/13/2019   LDLCALC 81 07/13/2019   TRIG 124 07/13/2019   CHOLHDL 3.7 07/13/2019       Patient was dx'd Hypothyroid in 1993 and  has been on Thyroid Replacement since.       Patient has hx/o prediabetes (A1c 5.7% / 2014 and 5.8% /2018)  and patient denies reactive hypoglycemic symptoms, visual blurring, diabetic polys or paresthesias. Last A1c was not at goal:  Lab Results  Component Value Date   HGBA1C 6.0 (H) 07/13/2019       Finally, patient has history of Vitamin D Deficiency("36"/ 2009 and "29" / 2016)and last Vitamin D was still low:  Lab Results  Component Value Date   VD25OH 37 07/13/2019    Current Outpatient Medications on File Prior to Visit  Medication Sig  . acetaminophen (TYLENOL) 325 MG tablet Take 325 mg by mouth every 6 (six) hours as needed.  Marland Kitchen amLODipine (NORVASC) 10 MG tablet TAKE 1/2 TO 1 (ONE-HALF TO ONE) TABLET BY MOUTH ONCE DAILY FOR BLOOD PRESSURE  . Ascorbic Acid (VITAMIN C PO) Take 1 tablet by mouth daily.  Marland Kitchen aspirin 81 MG tablet Take 81 mg by mouth daily.    Marland Kitchen atenolol (TENORMIN) 50 MG tablet Take 1 tablet Daily for BP  .  benazepril (LOTENSIN) 40 MG tablet Take 1 tablet Daily for BP  . Cholecalciferol (VITAMIN D3) 1000 units CAPS Take 1,000 Units by mouth 2 (two) times daily.  . clorazepate (TRANXENE) 7.5 MG tablet TAKE 1/2 TO 1 (ONE-HALF TO ONE) TABLET BY MOUTH 2 TO 3 TIMES DAILY AS NEEDED FOR ANXIETY  . Magnesium 500 MG TABS Take 2 tablets by mouth daily.  . meclizine (ANTIVERT) 25 MG tablet TAKE 1/2 TO 1 (ONE-HALF TO ONE) TABLET BY MOUTH THREE TIMES DAILY FOR  MOTION  SICKNESS/DIZZINESS  . SYNTHROID 150 MCG tablet Take 1 tablet daily on an empty stomach with only water for 30 minutes & no Antacid meds, Calcium or Magnesium for 4 hours & avoid Biotin  . vitamin B-12 (CYANOCOBALAMIN) 500 MCG tablet Take 500 mcg by mouth daily.  Marland Kitchen zinc gluconate 50 MG tablet Take 50 mg by mouth. Takes 1/2 tablet daily.   No current facility-administered medications on file prior to visit.   Allergies  Allergen Reactions  . Sulfa Antibiotics Other (See Comments)  . Asa [Aspirin] Other (See Comments)    Take 81mg  cannot take 325mg  gets nervous  . Codeine Nausea Only  . Prednisone Nausea And Vomiting   Past Medical History:  Diagnosis Date  . CKD (chronic kidney disease) stage 3, GFR 30-59 ml/min   . Colon polyp   .  Diverticulosis   . Hypertension   . Iron deficiency   . Prediabetes   . Prediabetes   . Small bowel obstruction [facility])    Health Maintenance  Topic Date Due  . COVID-19 Vaccine (1) Never done  . INFLUENZA VACCINE  01/10/2020  . TETANUS/TDAP  10/11/2027  . DEXA SCAN  Completed  . PNA vac Low Risk Adult  Completed   Immunization History  Administered Date(s) Administered  . DT (Pediatric) 03/22/2015  . Influenza Split 03/04/2013  . Influenza, High Dose Seasonal PF 03/16/2014, 03/22/2015, 02/15/2016, 04/23/2017, 04/30/2018, 04/08/2019  . Pneumococcal Polysaccharide-23 03/16/2014  . Td 10/10/2017    Last Colon - 01/09/2011 - Dr Henrene Pastor - Tubovillous adenoma polyp & was recommend 3-6 mo f/u repeat  Colonoscopy which patient  Refused repeatedly  Last MGM - 01/25/2010- patient refuses to schedule f/u MGM  Past Surgical History:  Procedure Laterality Date  . CATARACT EXTRACTION    . EYE SURGERY    . MACULAR TEAR    . Vericose vein  1960   Family History  Problem Relation Age of Onset  . Heart disease Mother   . Stroke Mother   . Heart disease Father   . Colon polyps Sister   . Diabetes Sister   . Stroke Sister   . Heart disease Brother   . Stroke Brother   . Hypertension Son    Social History   Tobacco Use  . Smoking status: Former Smoker    Packs/day: 1.00    Years: 15.00    Pack years: 15.00    Quit date: 06/11/1965    Years since quitting: 54.7  . Smokeless tobacco: Never Used  Vaping Use  . Vaping Use: Never used  Substance Use Topics  . Alcohol use: No  . Drug use: No    ROS Constitutional: Denies fever, chills, weight loss/gain, headaches, insomnia,  night sweats, and change in appetite. Does c/o fatigue. Eyes: Denies redness, blurred vision, diplopia, discharge, itchy, watery eyes.  ENT: Denies discharge, congestion, post nasal drip, epistaxis, sore throat, earache, hearing loss, dental pain, Tinnitus, Vertigo, Sinus pain, snoring.  Cardio: Denies chest pain, palpitations, irregular heartbeat, syncope, dyspnea, diaphoresis, orthopnea, PND, claudication, edema Respiratory: denies cough, dyspnea, DOE, pleurisy, hoarseness, laryngitis, wheezing.  Gastrointestinal: Denies dysphagia, heartburn, reflux, water brash, pain, cramps, nausea, vomiting, bloating, diarrhea, constipation, hematemesis, melena, hematochezia, jaundice, hemorrhoids Genitourinary: Denies dysuria, frequency, urgency, nocturia, hesitancy, discharge, hematuria, flank pain Breast: Breast lumps, nipple discharge, bleeding.  Musculoskeletal: Denies arthralgia, myalgia, stiffness, Jt. Swelling, pain, limp, and strain/sprain. Denies falls. Skin: Denies puritis, rash, hives, warts, acne, eczema, changing  in skin lesion Neuro: No weakness, tremor, incoordination, spasms, paresthesia, pain Psychiatric: Denies confusion, memory loss, sensory loss. Denies Depression. Endocrine: Denies change in weight, skin, hair change, nocturia, and paresthesia, diabetic polys, visual blurring, hyper / hypo glycemic episodes.  Heme/Lymph: No excessive bleeding, bruising, enlarged lymph nodes.  Physical Exam  BP 124/78   Pulse 81   Temp 98.1 F (36.7 C)   Ht 5' 1.5" (1.562 m)   Wt 166 lb (75.3 kg)   SpO2 96%   BMI 30.86 kg/m   General Appearance: Well nourished, well groomed and in no apparent distress.  Eyes: PERRLA, EOMs, conjunctiva no swelling or erythema, normal fundi and vessels. Sinuses: No frontal/maxillary tenderness ENT/Mouth: EACs patent / TMs  nl. Nares clear without erythema, swelling, mucoid exudates. Oral hygiene is good. No erythema, swelling, or exudate. Tongue normal, non-obstructing. Tonsils not swollen or erythematous. Hearing normal.  Neck: Supple, thyroid not palpable. No bruits, nodes or JVD. Respiratory: Respiratory effort normal.  BS equal and clear bilateral without rales, rhonci, wheezing or stridor. Cardio: Heart sounds are normal with regular rate and rhythm and no murmurs, rubs or gallops. Peripheral pulses are normal and equal bilaterally without edema. No aortic or femoral bruits. Chest: symmetric with normal excursions and percussion. Breasts: Symmetric, without lumps, nipple discharge, retractions, or fibrocystic changes.  Abdomen: Flat, soft with bowel sounds active. Nontender, no guarding, rebound, hernias, masses, or organomegaly.  Lymphatics: Non tender without lymphadenopathy.  Musculoskeletal: Full ROM all peripheral extremities, joint stability, 5/5 strength, and normal gait. Skin: Warm and dry without rashes, lesions, cyanosis, clubbing or  ecchymosis.  Neuro: Cranial nerves intact, reflexes equal bilaterally. Normal muscle tone, no cerebellar symptoms. Sensation  intact.  Pysch: Alert and oriented X 3, normal affect, Insight and Judgment appropriate.   Assessment and Plan  1. Annual Preventative Screening Examination   2. Essential hypertension  - EKG 12-Lead - Urinalysis, Routine w reflex microscopic - Microalbumin / creatinine urine ratio - CBC with Differential/Platelet - COMPLETE METABOLIC PANEL WITH GFR - Magnesium  3. Hyperlipidemia, mixed  - EKG 12-Lead - Lipid panel - TSH  4. Abnormal glucose  - EKG 12-Lead - Hemoglobin A1c - Insulin, random  5. Vitamin D deficiency  - VITAMIN D 25 Hydroxy  6. Stage 3b chronic kidney disease  - Urinalysis, Routine w reflex microscopic - Microalbumin / creatinine urine ratio - Parathyroid Hormone, Intact w/Ca  7. Hypothyroidism, unspecified type  - TSH  8. Screening for colorectal cancer  - POC Hemoccult Bld/Stl   9. Screening for ischemic heart disease  - EKG 12-Lead  10. FHx: heart disease  - EKG 12-Lead  11. Former smoker  - EKG 12-Lead  12. Medication management  - Urinalysis, Routine w reflex microscopic - Microalbumin / creatinine urine ratio - CBC with Differential/Platelet - COMPLETE METABOLIC PANEL WITH GFR - Magnesium - Lipid panel - TSH - Hemoglobin A1c - Insulin, random - VITAMIN D 25 Hydroxy  - Parathyroid Hormone, Intact w/Ca          Patient was counseled in prudent diet to achieve/maintain BMI less than 25 for weight control, BP monitoring, regular exercise and medications. Discussed med's effects and SE's. Screening labs and tests as requested with regular follow-up as recommended. Over 40 minutes of exam, counseling, chart review and high complex critical decision making was performed.   Kirtland Bouchard, MD

## 2020-02-09 ENCOUNTER — Ambulatory Visit (INDEPENDENT_AMBULATORY_CARE_PROVIDER_SITE_OTHER): Payer: Medicare HMO | Admitting: Internal Medicine

## 2020-02-09 ENCOUNTER — Encounter: Payer: Self-pay | Admitting: Internal Medicine

## 2020-02-09 ENCOUNTER — Other Ambulatory Visit: Payer: Self-pay

## 2020-02-09 VITALS — BP 124/78 | HR 81 | Temp 98.1°F | Resp 12 | Ht 61.5 in | Wt 166.0 lb

## 2020-02-09 DIAGNOSIS — N1832 Chronic kidney disease, stage 3b: Secondary | ICD-10-CM

## 2020-02-09 DIAGNOSIS — Z79899 Other long term (current) drug therapy: Secondary | ICD-10-CM | POA: Diagnosis not present

## 2020-02-09 DIAGNOSIS — Z136 Encounter for screening for cardiovascular disorders: Secondary | ICD-10-CM | POA: Diagnosis not present

## 2020-02-09 DIAGNOSIS — E039 Hypothyroidism, unspecified: Secondary | ICD-10-CM

## 2020-02-09 DIAGNOSIS — Z1211 Encounter for screening for malignant neoplasm of colon: Secondary | ICD-10-CM

## 2020-02-09 DIAGNOSIS — E782 Mixed hyperlipidemia: Secondary | ICD-10-CM

## 2020-02-09 DIAGNOSIS — I10 Essential (primary) hypertension: Secondary | ICD-10-CM | POA: Diagnosis not present

## 2020-02-09 DIAGNOSIS — Z87891 Personal history of nicotine dependence: Secondary | ICD-10-CM

## 2020-02-09 DIAGNOSIS — Z8249 Family history of ischemic heart disease and other diseases of the circulatory system: Secondary | ICD-10-CM | POA: Diagnosis not present

## 2020-02-09 DIAGNOSIS — R7309 Other abnormal glucose: Secondary | ICD-10-CM | POA: Diagnosis not present

## 2020-02-09 DIAGNOSIS — Z0001 Encounter for general adult medical examination with abnormal findings: Secondary | ICD-10-CM

## 2020-02-09 DIAGNOSIS — E559 Vitamin D deficiency, unspecified: Secondary | ICD-10-CM

## 2020-02-09 DIAGNOSIS — R7303 Prediabetes: Secondary | ICD-10-CM

## 2020-02-09 DIAGNOSIS — Z0000 Encounter for general adult medical examination without abnormal findings: Secondary | ICD-10-CM | POA: Diagnosis not present

## 2020-02-09 MED ORDER — PREGABALIN 50 MG PO CAPS: ORAL_CAPSULE | ORAL | 0 refills | Status: DC

## 2020-02-10 ENCOUNTER — Other Ambulatory Visit: Payer: Self-pay | Admitting: Internal Medicine

## 2020-02-10 LAB — CBC WITH DIFFERENTIAL/PLATELET
Absolute Monocytes: 651 cells/uL (ref 200–950)
Basophils Absolute: 49 cells/uL (ref 0–200)
Basophils Relative: 0.7 %
Eosinophils Absolute: 210 cells/uL (ref 15–500)
Eosinophils Relative: 3 %
HCT: 30.7 % — ABNORMAL LOW (ref 35.0–45.0)
Hemoglobin: 10 g/dL — ABNORMAL LOW (ref 11.7–15.5)
Lymphs Abs: 1624 cells/uL (ref 850–3900)
MCH: 28.4 pg (ref 27.0–33.0)
MCHC: 32.6 g/dL (ref 32.0–36.0)
MCV: 87.2 fL (ref 80.0–100.0)
MPV: 11.7 fL (ref 7.5–12.5)
Monocytes Relative: 9.3 %
Neutro Abs: 4466 cells/uL (ref 1500–7800)
Neutrophils Relative %: 63.8 %
Platelets: 233 10*3/uL (ref 140–400)
RBC: 3.52 10*6/uL — ABNORMAL LOW (ref 3.80–5.10)
RDW: 13 % (ref 11.0–15.0)
Total Lymphocyte: 23.2 %
WBC: 7 10*3/uL (ref 3.8–10.8)

## 2020-02-10 LAB — URINALYSIS, ROUTINE W REFLEX MICROSCOPIC
Bacteria, UA: NONE SEEN /HPF
Bilirubin Urine: NEGATIVE
Glucose, UA: NEGATIVE
Hgb urine dipstick: NEGATIVE
Hyaline Cast: NONE SEEN /LPF
Ketones, ur: NEGATIVE
Nitrite: NEGATIVE
Protein, ur: NEGATIVE
RBC / HPF: NONE SEEN /HPF (ref 0–2)
Specific Gravity, Urine: 1.011 (ref 1.001–1.03)
pH: 6 (ref 5.0–8.0)

## 2020-02-10 LAB — COMPLETE METABOLIC PANEL WITH GFR
AG Ratio: 1.6 (calc) (ref 1.0–2.5)
ALT: 14 U/L (ref 6–29)
AST: 24 U/L (ref 10–35)
Albumin: 4.1 g/dL (ref 3.6–5.1)
Alkaline phosphatase (APISO): 68 U/L (ref 37–153)
BUN/Creatinine Ratio: 19 (calc) (ref 6–22)
BUN: 23 mg/dL (ref 7–25)
CO2: 26 mmol/L (ref 20–32)
Calcium: 9.2 mg/dL (ref 8.6–10.4)
Chloride: 99 mmol/L (ref 98–110)
Creat: 1.23 mg/dL — ABNORMAL HIGH (ref 0.60–0.88)
GFR, Est African American: 46 mL/min/{1.73_m2} — ABNORMAL LOW (ref >=60)
GFR, Est Non African American: 40 mL/min/{1.73_m2} — ABNORMAL LOW (ref >=60)
Globulin: 2.6 g/dL (calc) (ref 1.9–3.7)
Glucose, Bld: 97 mg/dL (ref 65–99)
Potassium: 4.9 mmol/L (ref 3.5–5.3)
Sodium: 133 mmol/L — ABNORMAL LOW (ref 135–146)
Total Bilirubin: 0.3 mg/dL (ref 0.2–1.2)
Total Protein: 6.7 g/dL (ref 6.1–8.1)

## 2020-02-10 LAB — LIPID PANEL
Cholesterol: 138 mg/dL (ref <200)
HDL: 39 mg/dL — ABNORMAL LOW (ref >=50)
LDL Cholesterol (Calc): 78 mg/dL (calc)
Non-HDL Cholesterol (Calc): 99 mg/dL (calc) (ref <130)
Total CHOL/HDL Ratio: 3.5 (calc) (ref <5.0)
Triglycerides: 120 mg/dL (ref <150)

## 2020-02-10 LAB — PTH, INTACT AND CALCIUM
Calcium: 9.2 mg/dL (ref 8.6–10.4)
PTH: 33 pg/mL (ref 14–64)

## 2020-02-10 LAB — TSH: TSH: 2.38 mIU/L (ref 0.40–4.50)

## 2020-02-10 LAB — MICROALBUMIN / CREATININE URINE RATIO
Creatinine, Urine: 53 mg/dL (ref 20–275)
Microalb Creat Ratio: 9 mcg/mg creat (ref <30)
Microalb, Ur: 0.5 mg/dL

## 2020-02-10 LAB — INSULIN, RANDOM: Insulin: 9.2 u[IU]/mL

## 2020-02-10 LAB — MAGNESIUM: Magnesium: 2 mg/dL (ref 1.5–2.5)

## 2020-02-10 LAB — HEMOGLOBIN A1C
Hgb A1c MFr Bld: 5.9 % of total Hgb — ABNORMAL HIGH (ref <5.7)
Mean Plasma Glucose: 123 (calc)
eAG (mmol/L): 6.8 (calc)

## 2020-02-10 LAB — VITAMIN D 25 HYDROXY (VIT D DEFICIENCY, FRACTURES): Vit D, 25-Hydroxy: 100 ng/mL (ref 30–100)

## 2020-02-10 MED ORDER — PREGABALIN 50 MG PO CAPS
ORAL_CAPSULE | ORAL | 0 refills | Status: DC
Start: 2020-02-10 — End: 2020-06-16

## 2020-02-10 MED ORDER — FLUCONAZOLE 150 MG PO TABS: ORAL_TABLET | ORAL | 2 refills | Status: DC

## 2020-02-10 NOTE — Progress Notes (Signed)
===========================================================  -    CBC -Mild chronic Anemia is stable  ===========================================================  -  Kidney functions - still Stage 3b and stable over the last 3 years ===========================================================  -  Chol = 138 and LDL 78 - Both  Excellent   - Very low risk for Heart Attack  / Stroke =============================================================  - A1c 5.9% about the same - mildly elevated blood sugar, So................  - Avoid Sweets, Candy & White Stuff   - Rice, Potatoes, Breads &  Pasta ===========================================================  -  Vitamin D = 100 - Wonderful   - Excellent  ===========================================================  -  PTH reflects kidney function on calcium balance & is Normal & OK  ===========================================================  -  All Else - Electrolytes - Liver - Magnesium & Thyroid    - all  Normal / OK ===========================================================   - Keep up the [place] Work ! ===========================================================

## 2020-02-16 ENCOUNTER — Telehealth: Payer: Self-pay | Admitting: Hematology and Oncology

## 2020-02-16 ENCOUNTER — Other Ambulatory Visit: Payer: Self-pay | Admitting: Internal Medicine

## 2020-02-16 DIAGNOSIS — M792 Neuralgia and neuritis, unspecified: Secondary | ICD-10-CM

## 2020-02-16 MED ORDER — AMITRIPTYLINE HCL 10 MG PO TABS: ORAL_TABLET | ORAL | 0 refills | Status: DC

## 2020-02-16 NOTE — Telephone Encounter (Signed)
Called pt per 9/07 sch msg - no answer. Left message for patient to call back to reschedule appt.

## 2020-02-23 ENCOUNTER — Inpatient Hospital Stay: Payer: Medicare HMO | Attending: Hematology and Oncology | Admitting: Hematology and Oncology

## 2020-02-23 ENCOUNTER — Inpatient Hospital Stay: Payer: Medicare HMO

## 2020-02-23 NOTE — Assessment & Plan Note (Deleted)
January 2020:Iron studies: Iron saturation 20%, ferritin 36, T41 and folic acid normal SPEP: No M protein, Serum creatinine: 1.34, BUN 28, Haptoglobin, reticulocyte count, LDH did not show evidence of hemolysis Erythropoietin: 8.7(inappropriately low) Hemoglobin: 10.5  02/23/2019: Hemoglobin 10.3, MCV 88.2 02/23/2020:   The diagnosis is anemia of chronic kidney disease stage III. Since her hemoglobin is more than 10 she does not need immediate erythropoietin replacement therapy. Only if she has persistent low hemoglobin is below 10 then we will initiate ESA therapy.  Return to clinic in 1 year with review of labs and follow-up.

## 2020-02-24 DIAGNOSIS — M1712 Unilateral primary osteoarthritis, left knee: Secondary | ICD-10-CM | POA: Diagnosis not present

## 2020-03-23 DIAGNOSIS — M1712 Unilateral primary osteoarthritis, left knee: Secondary | ICD-10-CM | POA: Diagnosis not present

## 2020-03-23 DIAGNOSIS — M25572 Pain in left ankle and joints of left foot: Secondary | ICD-10-CM | POA: Diagnosis not present

## 2020-03-28 ENCOUNTER — Ambulatory Visit: Payer: Medicare HMO | Admitting: Adult Health

## 2020-03-30 ENCOUNTER — Ambulatory Visit: Payer: Medicare HMO | Admitting: Adult Health

## 2020-05-19 ENCOUNTER — Ambulatory Visit: Payer: Medicare HMO | Admitting: Adult Health Nurse Practitioner

## 2020-06-16 ENCOUNTER — Other Ambulatory Visit: Payer: Self-pay | Admitting: Internal Medicine

## 2020-06-16 ENCOUNTER — Ambulatory Visit (INDEPENDENT_AMBULATORY_CARE_PROVIDER_SITE_OTHER): Payer: Medicare HMO | Admitting: Adult Health Nurse Practitioner

## 2020-06-16 ENCOUNTER — Other Ambulatory Visit: Payer: Self-pay

## 2020-06-16 ENCOUNTER — Encounter: Payer: Self-pay | Admitting: Adult Health Nurse Practitioner

## 2020-06-16 VITALS — BP 128/78 | HR 74 | Temp 97.7°F | Wt 166.0 lb

## 2020-06-16 DIAGNOSIS — R7309 Other abnormal glucose: Secondary | ICD-10-CM

## 2020-06-16 DIAGNOSIS — I10 Essential (primary) hypertension: Secondary | ICD-10-CM

## 2020-06-16 DIAGNOSIS — Z0000 Encounter for general adult medical examination without abnormal findings: Secondary | ICD-10-CM

## 2020-06-16 DIAGNOSIS — E039 Hypothyroidism, unspecified: Secondary | ICD-10-CM

## 2020-06-16 DIAGNOSIS — E782 Mixed hyperlipidemia: Secondary | ICD-10-CM

## 2020-06-16 DIAGNOSIS — R002 Palpitations: Secondary | ICD-10-CM

## 2020-06-16 DIAGNOSIS — R300 Dysuria: Secondary | ICD-10-CM | POA: Diagnosis not present

## 2020-06-16 DIAGNOSIS — I7300 Raynaud's syndrome without gangrene: Secondary | ICD-10-CM | POA: Diagnosis not present

## 2020-06-16 DIAGNOSIS — E663 Overweight: Secondary | ICD-10-CM

## 2020-06-16 DIAGNOSIS — N1832 Chronic kidney disease, stage 3b: Secondary | ICD-10-CM

## 2020-06-16 DIAGNOSIS — E2839 Other primary ovarian failure: Secondary | ICD-10-CM

## 2020-06-16 DIAGNOSIS — K5790 Diverticulosis of intestine, part unspecified, without perforation or abscess without bleeding: Secondary | ICD-10-CM

## 2020-06-16 DIAGNOSIS — Z8673 Personal history of transient ischemic attack (TIA), and cerebral infarction without residual deficits: Secondary | ICD-10-CM

## 2020-06-16 DIAGNOSIS — R6889 Other general symptoms and signs: Secondary | ICD-10-CM

## 2020-06-16 DIAGNOSIS — Z0001 Encounter for general adult medical examination with abnormal findings: Secondary | ICD-10-CM

## 2020-06-16 NOTE — Progress Notes (Signed)
MEDICARE ANNUAL WELLNESS VISIT AND FOLLOW UP   Assessment:     Encounter for Medicare annual wellness exam Yearly  Essential hypertension - continue medications, DASH diet, exercise and monitor at home. Call if greater than 130/80.  -check BP in the morning and at 9 oclock - taking benazepril and bASA in the morning, take norvasc 1/2 at 7 without checking BP before that - if your BP is over 140/80 at 10 pm, you can take 1/2 of the atenolol   Hyperlipidemia At goal without medications at this time;  Continue low cholesterol diet and exercise.  Defer lipid panel as would not aggressively treat secondary to age  Abnormal glucose Discussed disease and risks Discussed diet/exercise, weight management  A1C  CKD Stage III (GFR 56 ml/min) Increase fluids, avoid NSAIDS, monitor sugars, will monitor - CMP/GFR   Hypothyroidism, unspecified hypothyroidism type -check TSH level, continue medications the same, reminded to take on an empty stomach 30-65mins before food.     Diverticulosis of intestine without bleeding, unspecified intestinal tract location Controlled, diet    Raynaud phenomenon Declines meds at this time   Overweight Long discussion about weight loss, diet, and exercise Recommended diet heavy in fruits and veggies and low in animal meats, cheeses, and dairy products, appropriate calorie intake Discussed appropriate weight for height Follow up at next visit  History of TIA Control B/P, lipids, glucose bASA  Estrogen Deficiency DEXA UTD  Dysuria -UA Increase water intake   Vitamin D deficiency Continue supplementation to maintain goal of 70-100 Taking Vitamin D 2,000 IU daily Defer vitamin D level  Unifocal PVCs Monitor yearly, check labs  Medication management    Further disposition pending results if labs check today. Discussed med's effects and SE's.   Over 30 minutes of face to face interview, exam, counseling, chart review, and critical  decision making was performed.    Future Appointments  Date Time Provider [place]  09/27/2020  2:30 PM [name], MD [facility] None  02/09/2021  3:00 PM [name], MD [facility] None  06/19/2021  2:30 PM Garnet Sierras, NP [facility] None     Plan:   During the course of the visit the patient was educated and counseled about appropriate screening and preventive services including:    Pneumococcal vaccine   Influenza vaccine  Td vaccine  Screening electrocardiogram  Screening mammography  Bone densitometry screening  Colorectal cancer screening  Diabetes screening  Glaucoma screening  Nutrition counseling   Advanced directives: given info/requested   Subjective:   Mikayla Harrison is a 87 y.o. female who presents for Medicare Annual Wellness Visit and 3 month follow up on hypertension, prediabetes, hyperlipidemia, vitamin D def.   She has had ongoing ear pain at her jaw, causes dizziness, states if she opens and closes her mouth she can "pop" her ear and make it feel better/relieve the issues.  She is not on flonase.    Plantar fascitis and she has bone spurs on the left foot.  She has been to Foot specialist, Dr Earleen Newport.   On 4/11 she was given keflex and dexamethasone, she states she normal has swelling in her ankles but she did not have any since taking the prednisone for the bug bite but states she still had soreness. She has a history of CREST syndrome, may need to follow up with rheumatology.   She lives by herself, has a lot of anxiety at night, has clorazepate if needed, takes 1/2-1/4 tab occasionally.   BMI is  Body mass index is 30.86 kg/m., she has not been working on diet and exercise. Wt Readings from Last 3 Encounters:  06/16/20 166 lb (75.3 kg)  02/09/20 166 lb (75.3 kg)  08/18/19 165 lb (74.8 kg)   Her blood pressure has been controlled at home, today their BP is BP: 128/78. She was switched from ACE to ARB due to cough however  she has not switched and states cough got better with ABX/prednisone.  She checks her BP in the AM, at 5 pm occ will do 1/4 of atenolol or 1/2 of amlodipine, then she will check it at 8 and it will begin to accerate  Norvasc atenolol at 5, benazepril at 7 or 8 30.   She does not workout but stays active around the house. She denies chest pain, shortness of breath, dizziness.   She is not on cholesterol medication and denies myalgias. Her cholesterol is at goal. The cholesterol last visit was:   Lab Results  Component Value Date   CHOL 150 06/16/2020   HDL 39 (L) 06/16/2020   LDLCALC 89 06/16/2020   TRIG 122 06/16/2020   CHOLHDL 3.8 06/16/2020    She has CKD stage III due to HTN. Lab Results  Component Value Date   GFRNONAA 44 (L) 06/16/2020   She has been working on diet and exercise for prediabetes, and denies paresthesia of the feet, polydipsia, polyuria and visual disturbances. She is on bASA.  Last A1C in the office was:  Lab Results  Component Value Date   HGBA1C 5.9 (H) 02/09/2020   Patient is on Vitamin D supplement. Lab Results  Component Value Date   VD25OH 100 02/09/2020     She is on thyroid medication. Her medication was not changed last visit.   Lab Results  Component Value Date   TSH 2.04 06/16/2020   Patient was held overnight for observation in May 2018 with a (-) Head CT, MRI & carotid dopplers and she was discharged home on LD bASA for suspected possible TIA.  Medication Review Current Outpatient Medications on File Prior to Visit  Medication Sig  . acetaminophen (TYLENOL) 325 MG tablet Take 325 mg by mouth every 6 (six) hours as needed.  Marland Kitchen amitriptyline (ELAVIL) 10 MG tablet Take     1 to 2 tablets      2 to 3 x /day     for Neuritis pains of Feet & Legs  . amLODipine (NORVASC) 10 MG tablet TAKE 1/2 TO 1 (ONE-HALF TO ONE) TABLET BY MOUTH ONCE DAILY FOR BLOOD PRESSURE  . Ascorbic Acid (VITAMIN C PO) Take 1 tablet by mouth daily.  Marland Kitchen aspirin 81 MG tablet  Take 81 mg by mouth daily.    . benazepril (LOTENSIN) 40 MG tablet Take 1 tablet Daily for BP  . Cholecalciferol (VITAMIN D3) 1000 units CAPS Take 1,000 Units by mouth 2 (two) times daily.  . clorazepate (TRANXENE) 7.5 MG tablet TAKE 1/2 TO 1 (ONE-HALF TO ONE) TABLET BY MOUTH 2 TO 3 TIMES DAILY AS NEEDED FOR ANXIETY  . fluconazole (DIFLUCAN) 150 MG tablet Take 1 tablet 2 x /week  if needed for yeast infection  . Magnesium 500 MG TABS Take 2 tablets by mouth daily.  . meclizine (ANTIVERT) 25 MG tablet TAKE 1/2 TO 1 (ONE-HALF TO ONE) TABLET BY MOUTH THREE TIMES DAILY FOR  MOTION  SICKNESS/DIZZINESS  . SYNTHROID 150 MCG tablet Take 1 tablet daily on an empty stomach with only water for 30 minutes &  no Antacid meds, Calcium or Magnesium for 4 hours & avoid Biotin  . vitamin B-12 (CYANOCOBALAMIN) 500 MCG tablet Take 500 mcg by mouth daily.  Marland Kitchen zinc gluconate 50 MG tablet Take 50 mg by mouth. Takes 1/2 tablet daily.   No current facility-administered medications on file prior to visit.    Current Problems (verified) Patient Active Problem List   Diagnosis Date Noted  . Enrolled in chronic care management 11/14/2018  . Normocytic anemia 06/10/2018  . CREST syndrome ([place]) 02/12/2018  . Abnormal glucose 10/10/2017  . FHx: heart disease 10/10/2017  . History of TIA (transient ischemic attack) 11/06/2016  . Raynaud phenomenon 08/01/2015  . Overweight (BMI 25.0-29.9) 03/22/2015  . 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 CKD (chronic kidney disease) stage 3, GFR 30-59 ml/min (HCC) 03/16/2014  . Hyperlipidemia, mixed 10/05/2013  . Screening for colorectal cancer 10/05/2013  . Vitamin D deficiency 10/05/2013  . Prediabetes   . Diverticulosis   . Essential hypertension 01/14/2012  . Hypothyroidism 01/14/2012    Screening Tests Immunization History  Administered Date(s) Administered  . DT (Pediatric) 03/22/2015  . Influenza Split 03/04/2013  . Influenza, High Dose Seasonal PF 03/16/2014, 03/22/2015,  02/15/2016, 04/23/2017, 04/30/2018, 04/08/2019  . Pneumococcal Polysaccharide-23 03/16/2014  . Td 10/10/2017    Preventative care: Last colonoscopy:  2004, will not get another EGD: 2012, Dr. Henrene Pastor Last mammogram: 2011, declines another and declines breast exam at this time.  Last pap smear/pelvic exam: remote DEXA:remote at Dr. Delanna Ahmadi office, willing to get  Prior vaccinations: TD or Tdap:2016 Influenza: 2021 DUE Pneumococcal: 03/2014 Prevnar13:  03/2015 Shingles/Zostavax: declines  Names of Other Physician/Practitioners you currently use: 1. [facility]- here for primary care 2. Dr. Delman Cheadle, eye doctor, last visit 3 years, DUE 3. , dentist, last visit 3 years ago, has dentures  Patient Care Team: [name], MD as PCP - General (Internal Medicine)  Allergies Allergies  Allergen Reactions  . Sulfa Antibiotics Other (See Comments)  . Asa [Aspirin] Other (See Comments)    Take 81mg  cannot take 325mg  gets nervous  . Codeine Nausea Only  . Prednisone Nausea And Vomiting    SURGICAL HISTORY She  has a past surgical history that includes Cataract extraction; Vericose vein (1960); MACULAR TEAR; and Eye surgery. FAMILY HISTORY Her family history includes Colon polyps in her sister; Diabetes in her sister; Heart disease in her brother, father, and mother; Hypertension in her son; Stroke in her brother, mother, and sister. SOCIAL HISTORY She  reports that she quit smoking about 55 years ago. She has a 15.00 pack-year smoking history. She has never used smokeless tobacco. She reports that she does not drink alcohol and does not use drugs.  MEDICARE WELLNESS OBJECTIVES: Physical activity: yard work and walking Depression/mood screen:   Depression screen [facility] 2/9 06/16/2020  Decreased Interest 0  Down, Depressed, Hopeless 0  PHQ - 2 Score 0   ADLs:  In your present state of health, do you have any difficulty performing the following  activities: 06/16/2020 02/08/2020  Hearing? Y N  Vision? N N  Difficulty concentrating or making decisions? N N  Walking or climbing stairs? N N  Dressing or bathing? N N  Doing errands, shopping? N N  Preparing Food and eating ? N -  Using the Toilet? N -  In the past six months, have you accidently leaked urine? N -  Do you have problems with loss of bowel control? N -  Managing your Medications? N -  Managing your Finances? N -  Housekeeping or managing your Housekeeping? N -  Some recent data might be hidden    Cognitive Testing  Alert? Yes  Normal Appearance?Yes  Oriented to person? Yes  Place? Yes   Time? Yes  Recall of three objects?  Yes  Can perform simple calculations? Yes  Displays appropriate judgment?Yes  Can read the correct time from a watch face?Yes  EOL planning: Does Patient Have a Medical Advance Directive?: No No, declined information   Objective:   Blood pressure 128/78, pulse 74, temperature 97.7 F (36.5 C), weight 166 lb (75.3 kg), SpO2 96 %. Body mass index is 30.86 kg/m.  General Appearance: Well nourished, in no apparent distress. Eyes: PERRLA, EOMs, conjunctiva no swelling or erythema Sinuses: No Frontal/maxillary tenderness ENT/Mouth: Ext aud canals clear, TMs without erythema, bulging. No erythema, swelling, or exudate on post pharynx.  Tonsils not swollen or erythematous. Hearing normal.  Neck: Supple, thyroid normal.  Respiratory: Respiratory effort normal, BS equal bilaterally without rales, rhonchi, wheezing or stridor.  Cardio: RRR with no MRGs. Brisk peripheral pulses without edema.  Abdomen: Soft, + BS.  Non tender, no guarding, rebound, hernias, masses. Lymphatics: Non tender without lymphadenopathy.  Musculoskeletal: Full ROM, 5/5 strength, Normal gait Skin: Warm, dry without rashes, lesions, ecchymosis.  Neuro: Cranial nerves intact. No cerebellar symptoms.  Psych: Awake and oriented X 3, normal affect, Insight and Judgment  appropriate.    Medicare Attestation I have personally reviewed: The patient's medical and social history Their use of alcohol, tobacco or illicit drugs Their current medications and supplements The patient's functional ability including ADLs,fall risks, home safety risks, cognitive, and hearing and visual impairment Diet and physical activities Evidence for depression or mood disorders  The patient's weight, height, BMI, and visual acuity have been recorded in the chart.  I have made referrals, counseling, and provided education to the patient based on review of the above and I have provided the patient with a written personalized care plan for preventive services.       Garnet Sierras, Laqueta Jean, DNP [facility] 06/16/2020  5:17 PM

## 2020-06-16 NOTE — Patient Instructions (Addendum)
  Increase the amount of water you drink every day.  Aim for 64 ounces.  Try wearing the compression stocking at bedtime when you lay down at night and take off in the morning.  This may help while you are sleeping since it is hard to wear them during the day.   During the day you can take Mucinex  Take Zyrtec (Cetirizine) take one tablet at bedtime to help dry up the fluid in your ears.   Mikayla Harrison , Thank you for taking time to come for your Medicare Wellness Visit. I appreciate your ongoing commitment to your health goals. Please review the following plan we discussed and let me know if I can assist you in the future.   These are the goals we discussed: Goals    . Blood Pressure < 140/80     -check BP in the morning and at 9 oclock - take benazepril and bASA in the morning, take norvasc 1/2 at 7 without checking BP before that - if your BP is over 140/80 at 10 pm, you can take 1/2 of the atenolol       This is a list of the screening recommended for you and due dates:  Health Maintenance  Topic Date Due  . COVID-19 Vaccine (1) Never done  . Flu Shot  01/10/2020  . Tetanus Vaccine  10/11/2027  . DEXA scan (bone density measurement)  Completed  . Pneumonia vaccines  Completed

## 2020-06-18 LAB — LIPID PANEL
Cholesterol: 150 mg/dL (ref <200)
HDL: 39 mg/dL — ABNORMAL LOW (ref >=50)
LDL Cholesterol (Calc): 89 mg/dL (calc)
Non-HDL Cholesterol (Calc): 111 mg/dL (calc) (ref <130)
Total CHOL/HDL Ratio: 3.8 (calc) (ref <5.0)
Triglycerides: 122 mg/dL (ref <150)

## 2020-06-18 LAB — URINE CULTURE
MICRO NUMBER:: 11391304
SPECIMEN QUALITY:: ADEQUATE

## 2020-06-18 LAB — CBC WITH DIFFERENTIAL/PLATELET
Absolute Monocytes: 655 cells/uL (ref 200–950)
Basophils Absolute: 46 cells/uL (ref 0–200)
Basophils Relative: 0.6 %
Eosinophils Absolute: 200 cells/uL (ref 15–500)
Eosinophils Relative: 2.6 %
HCT: 31.2 % — ABNORMAL LOW (ref 35.0–45.0)
Hemoglobin: 10.5 g/dL — ABNORMAL LOW (ref 11.7–15.5)
Lymphs Abs: 1663 cells/uL (ref 850–3900)
MCH: 29.7 pg (ref 27.0–33.0)
MCHC: 33.7 g/dL (ref 32.0–36.0)
MCV: 88.4 fL (ref 80.0–100.0)
MPV: 11.8 fL (ref 7.5–12.5)
Monocytes Relative: 8.5 %
Neutro Abs: 5136 cells/uL (ref 1500–7800)
Neutrophils Relative %: 66.7 %
Platelets: 251 10*3/uL (ref 140–400)
RBC: 3.53 10*6/uL — ABNORMAL LOW (ref 3.80–5.10)
RDW: 12.6 % (ref 11.0–15.0)
Total Lymphocyte: 21.6 %
WBC: 7.7 10*3/uL (ref 3.8–10.8)

## 2020-06-18 LAB — URINALYSIS W MICROSCOPIC + REFLEX CULTURE
Bacteria, UA: NONE SEEN /HPF
Bilirubin Urine: NEGATIVE
Glucose, UA: NEGATIVE
Hgb urine dipstick: NEGATIVE
Hyaline Cast: NONE SEEN /LPF
Ketones, ur: NEGATIVE
Nitrites, Initial: NEGATIVE
Protein, ur: NEGATIVE
RBC / HPF: NONE SEEN /HPF (ref 0–2)
Specific Gravity, Urine: 1.011 (ref 1.001–1.03)
pH: 6.5 (ref 5.0–8.0)

## 2020-06-18 LAB — COMPLETE METABOLIC PANEL WITH GFR
AG Ratio: 1.4 (calc) (ref 1.0–2.5)
ALT: 14 U/L (ref 6–29)
AST: 22 U/L (ref 10–35)
Albumin: 3.8 g/dL (ref 3.6–5.1)
Alkaline phosphatase (APISO): 65 U/L (ref 37–153)
BUN/Creatinine Ratio: 16 (calc) (ref 6–22)
BUN: 18 mg/dL (ref 7–25)
CO2: 27 mmol/L (ref 20–32)
Calcium: 9.3 mg/dL (ref 8.6–10.4)
Chloride: 99 mmol/L (ref 98–110)
Creat: 1.14 mg/dL — ABNORMAL HIGH (ref 0.60–0.88)
GFR, Est African American: 50 mL/min/{1.73_m2} — ABNORMAL LOW (ref >=60)
GFR, Est Non African American: 44 mL/min/{1.73_m2} — ABNORMAL LOW (ref >=60)
Globulin: 2.7 g/dL (calc) (ref 1.9–3.7)
Glucose, Bld: 90 mg/dL (ref 65–99)
Potassium: 4.2 mmol/L (ref 3.5–5.3)
Sodium: 135 mmol/L (ref 135–146)
Total Bilirubin: 0.3 mg/dL (ref 0.2–1.2)
Total Protein: 6.5 g/dL (ref 6.1–8.1)

## 2020-06-18 LAB — TSH: TSH: 2.04 mIU/L (ref 0.40–4.50)

## 2020-06-18 LAB — CULTURE INDICATED

## 2020-06-22 DIAGNOSIS — M1712 Unilateral primary osteoarthritis, left knee: Secondary | ICD-10-CM | POA: Diagnosis not present

## 2020-07-05 ENCOUNTER — Other Ambulatory Visit: Payer: Self-pay | Admitting: Internal Medicine

## 2020-07-05 DIAGNOSIS — I10 Essential (primary) hypertension: Secondary | ICD-10-CM

## 2020-07-06 ENCOUNTER — Other Ambulatory Visit: Payer: Self-pay | Admitting: Internal Medicine

## 2020-07-06 DIAGNOSIS — E039 Hypothyroidism, unspecified: Secondary | ICD-10-CM

## 2020-07-06 MED ORDER — SYNTHROID 150 MCG PO TABS
ORAL_TABLET | ORAL | 0 refills | Status: DC
Start: 2020-07-06 — End: 2020-12-12

## 2020-08-04 ENCOUNTER — Encounter: Payer: Self-pay | Admitting: *Deleted

## 2020-08-04 DIAGNOSIS — H9209 Otalgia, unspecified ear: Secondary | ICD-10-CM | POA: Insufficient documentation

## 2020-08-23 ENCOUNTER — Ambulatory Visit (INDEPENDENT_AMBULATORY_CARE_PROVIDER_SITE_OTHER): Payer: Medicare HMO | Admitting: Otolaryngology

## 2020-08-23 ENCOUNTER — Ambulatory Visit: Payer: Medicare HMO | Admitting: Internal Medicine

## 2020-08-25 ENCOUNTER — Other Ambulatory Visit: Payer: Self-pay | Admitting: Adult Health

## 2020-08-30 ENCOUNTER — Telehealth: Payer: Self-pay | Admitting: Internal Medicine

## 2020-08-30 NOTE — Telephone Encounter (Signed)
Patient called to report another episode of nausea, vomiting, from severe vertigo. patient requesting specific ENT, Dr Jerrell Belfast, for recent ENT referral. Per Dr [name], ENT referral routed to [facility], Dr Jerrell Belfast.

## 2020-09-26 ENCOUNTER — Encounter: Payer: Self-pay | Admitting: Internal Medicine

## 2020-09-26 NOTE — Progress Notes (Signed)
Future Appointments  Date Time Provider [place]  09/27/2020  2:30 PM [name], MD [facility] None  02/09/2021  3:00 PM [name], MD [facility] None  06/19/2021  2:30 PM Garnet Sierras, NP [facility] None    History of Present Illness:       This very nice 87 y.o. WWF presents for 3 month follow up with HTN, HLD, Hypothyroidism,  Pre-Diabetes and Vitamin D Deficiency.        Patient is treated for HTN & BP has been controlled at home. Today's BP is at goal - 126/64. Patient has had no complaints of any cardiac type chest pain, palpitations, dyspnea / orthopnea / PND, dizziness, claudication, or dependent edema.       Hyperlipidemia is controlled with diet & meds. Patient denies myalgias or other med SE's. Last Lipids were at goal:  Lab Results  Component Value Date   CHOL 150 06/16/2020   HDL 39 (L) 06/16/2020   LDLCALC 89 06/16/2020   TRIG 122 06/16/2020   CHOLHDL 3.8 06/16/2020     Also, the patient has history of PreDiabetes and has had no symptoms of reactive hypoglycemia, diabetic polys, paresthesias or visual blurring.  Last A1c was not at goal:  Lab Results  Component Value Date   HGBA1C 5.9 (H) 02/09/2020            Further, the patient also has history of Vitamin D Deficiency and supplements vitamin D without any suspected side-effects. Last vitamin D was  Lab Results  Component Value Date   VD25OH 100 02/09/2020    Current Outpatient Medications on File Prior to Visit  Medication Sig  . acetaminophen 325 MG tablet Take every 6  hours as needed.  Marland Kitchen amLODipine 10 MG tablet TAKE 1/2 TO 1 TABLET  DAILY   . VITAMIN C  Take 1 tablet daily.  Marland Kitchen aspirin 81 MG tablet Take  daily.  Marland Kitchen atenolol  50 MG tablet Take 1 tablet  daily f   . benazepril 40 MG tablet Take 1 tablet  Daily   . VITAMIN D 1000 units  Take 1,000 Units by mouth 2 (two) times daily.  . clorazepate  7.5 MG tablet TAKE 1/2-1 TAB 2-3 x/ DAILY AS NEEDED   . Magnesium 500 MG  TABS Take 2 tablets  daily.  . meclizine  25 MG tablet TAKE 1/2 -1 TABLET THREE TIMES DAILY   . SYNTHROID 150 MCG tablet Take 1 tablet daily  . vitamin B-12  500 MCG tablet Take  daily.  Marland Kitchen zinc  50 MG tablet Takes 1/2 tablet daily.   No current facility-administered medications on file prior to visit.    Allergies  Allergen Reactions  . Sulfa Antibiotics   . Asa [Aspirin]     Take 81mg  cannot take 325mg  gets nervous  . Codeine Nausea Only  . Prednisone Nausea And Vomiting    PMHx:   Past Medical History:  Diagnosis Date  . CKD (chronic kidney disease) stage 3, GFR 30-59 ml/min (HCC)   . Colon polyp   . Diverticulosis   . Hypertension   . Iron deficiency   . Prediabetes   . Prediabetes   . Small bowel obstruction ([place])     Immunization History  Administered Date(s) Administered  . DT (Pediatric) 03/22/2015  . Influenza Split 03/04/2013  . Influenza, High Dose Seasonal PF 03/16/2014, 03/22/2015, 02/15/2016, 04/23/2017, 04/30/2018, 04/08/2019  . Pneumococcal Polysaccharide-23 03/16/2014  . Td 10/10/2017  Past Surgical History:  Procedure Laterality Date  . CATARACT EXTRACTION    . EYE SURGERY    . MACULAR TEAR    . Vericose vein  1960    FHx:    Reviewed / unchanged  SHx:    Reviewed / unchanged   Systems Review:  Constitutional: Denies fever, chills, wt changes, headaches, insomnia, fatigue, night sweats, change in appetite. Eyes: Denies redness, blurred vision, diplopia, discharge, itchy, watery eyes.  ENT: Denies discharge, congestion, post nasal drip, epistaxis, sore throat, earache, hearing loss, dental pain, tinnitus, vertigo, sinus pain, snoring.  CV: Denies chest pain, palpitations, irregular heartbeat, syncope, dyspnea, diaphoresis, orthopnea, PND, claudication or edema. Respiratory: denies cough, dyspnea, DOE, pleurisy, hoarseness, laryngitis, wheezing.  Gastrointestinal: Denies dysphagia, odynophagia, heartburn, reflux, water brash, abdominal pain  or cramps, nausea, vomiting, bloating, diarrhea, constipation, hematemesis, melena, hematochezia  or hemorrhoids. Genitourinary: Denies dysuria, frequency, urgency, nocturia, hesitancy, discharge, hematuria or flank pain. Musculoskeletal: Denies arthralgias, myalgias, stiffness, jt. swelling, pain, limping or strain/sprain.  Skin: Denies pruritus, rash, hives, warts, acne, eczema or change in skin lesion(s). Neuro: No weakness, tremor, incoordination, spasms, paresthesia or pain. Psychiatric: Denies confusion, memory loss or sensory loss. Endo: Denies change in weight, skin or hair change.  Heme/Lymph: No excessive bleeding, bruising or enlarged lymph nodes.  Physical Exam  BP 126/64   Pulse 65   Temp 97.7 F (36.5 C) (Temporal)   Resp 16   Ht 5\' 2"  (1.575 m)   Wt 159 lb 12.8 oz (72.5 kg)   SpO2 99%   BMI 29.23 kg/m   Appears  well nourished, well groomed  and in no distress.  Eyes: PERRLA, EOMs, conjunctiva no swelling or erythema. Sinuses: No frontal/maxillary tenderness ENT/Mouth: EAC's clear, TM's nl w/o erythema, bulging. Nares clear w/o erythema, swelling, exudates. Oropharynx clear without erythema or exudates. Oral hygiene is good. Tongue normal, non obstructing. Hearing intact.  Neck: Supple. Thyroid not palpable. Car 2+/2+ without bruits, nodes or JVD. Chest: Respirations nl with BS clear & equal w/o rales, rhonchi, wheezing or stridor.  Cor: Heart sounds normal w/ regular rate and rhythm without sig. murmurs, gallops, clicks or rubs. Peripheral pulses normal and equal  without edema.  Abdomen: Soft & bowel sounds normal. Non-tender w/o guarding, rebound, hernias, masses or organomegaly.  Lymphatics: Unremarkable.  Musculoskeletal: Full ROM all peripheral extremities, joint stability, 5/5 strength and normal gait.  Skin: Warm, dry without exposed rashes, lesions or ecchymosis apparent.  Neuro: Cranial nerves intact, reflexes equal bilaterally. Sensory-motor testing grossly  intact. Tendon reflexes grossly intact.  Pysch: Alert & oriented x 3.  Insight and judgement nl & appropriate. No ideations.  Assessment and Plan:  1. Essential hypertension  - Continue medication, monitor blood pressure at home.  - Continue DASH diet.  Reminder to go to the ER if any CP,  SOB, nausea, dizziness, severe HA, changes vision/speech.  - CBC with Differential/Platelet - COMPLETE METABOLIC PANEL WITH GFR - Magnesium  2. Hyperlipidemia, mixed  - Continue diet/meds, exercise,& lifestyle modifications.  - Continue monitor periodic cholesterol/liver & renal functions   - Lipid panel - TSH  3. Abnormal glucose  - Continue diet, exercise  - Lifestyle modifications.  - Monitor appropriate labs  - Hemoglobin A1c - Insulin, random  4. Vitamin D deficiency  - Continue supplementation.  - VITAMIN D 25 Hydroxy  5. Stage 3b chronic kidney disease (HCC)  - COMPLETE METABOLIC PANEL WITH GFR  6. Medication management  - CBC with Differential/Platelet - COMPLETE METABOLIC  PANEL WITH GFR - Magnesium - Lipid panel - TSH - Hemoglobin A1c - Insulin, random - VITAMIN D 25 Hydroxy         Discussed  regular exercise, BP monitoring, weight control to achieve/maintain BMI less than 25 and discussed med and SE's. Recommended labs to assess and monitor clinical status with further disposition pending results of labs.  I discussed the assessment and treatment plan with the patient. The patient was provided an opportunity to ask questions and all were answered. The patient agreed with the plan and demonstrated an understanding of the instructions.  I provided over 30 minutes of exam, counseling, chart review and  complex critical decision making.       The patient was advised to call back or seek an in-person evaluation if the symptoms worsen or if the condition fails to improve as anticipated.   Kirtland Bouchard, MD

## 2020-09-26 NOTE — Patient Instructions (Signed)

## 2020-09-27 ENCOUNTER — Ambulatory Visit (INDEPENDENT_AMBULATORY_CARE_PROVIDER_SITE_OTHER): Payer: Medicare Other | Admitting: Internal Medicine

## 2020-09-27 ENCOUNTER — Other Ambulatory Visit: Payer: Self-pay

## 2020-09-27 VITALS — BP 126/64 | HR 65 | Temp 97.7°F | Resp 16 | Ht 62.0 in | Wt 159.8 lb

## 2020-09-27 DIAGNOSIS — R7309 Other abnormal glucose: Secondary | ICD-10-CM

## 2020-09-27 DIAGNOSIS — I10 Essential (primary) hypertension: Secondary | ICD-10-CM | POA: Diagnosis not present

## 2020-09-27 DIAGNOSIS — E782 Mixed hyperlipidemia: Secondary | ICD-10-CM

## 2020-09-27 DIAGNOSIS — E559 Vitamin D deficiency, unspecified: Secondary | ICD-10-CM | POA: Diagnosis not present

## 2020-09-27 DIAGNOSIS — N1832 Chronic kidney disease, stage 3b: Secondary | ICD-10-CM

## 2020-09-27 DIAGNOSIS — Z79899 Other long term (current) drug therapy: Secondary | ICD-10-CM

## 2020-09-27 MED ORDER — GABAPENTIN 300 MG PO CAPS: ORAL_CAPSULE | ORAL | 3 refills | Status: DC

## 2020-09-28 LAB — CBC WITH DIFFERENTIAL/PLATELET
Absolute Monocytes: 630 cells/uL (ref 200–950)
Basophils Absolute: 50 cells/uL (ref 0–200)
Basophils Relative: 0.6 %
Eosinophils Absolute: 101 cells/uL (ref 15–500)
Eosinophils Relative: 1.2 %
HCT: 32.6 % — ABNORMAL LOW (ref 35.0–45.0)
Hemoglobin: 10.5 g/dL — ABNORMAL LOW (ref 11.7–15.5)
Lymphs Abs: 1646 cells/uL (ref 850–3900)
MCH: 28.3 pg (ref 27.0–33.0)
MCHC: 32.2 g/dL (ref 32.0–36.0)
MCV: 87.9 fL (ref 80.0–100.0)
MPV: 11.6 fL (ref 7.5–12.5)
Monocytes Relative: 7.5 %
Neutro Abs: 5972 cells/uL (ref 1500–7800)
Neutrophils Relative %: 71.1 %
Platelets: 273 10*3/uL (ref 140–400)
RBC: 3.71 10*6/uL — ABNORMAL LOW (ref 3.80–5.10)
RDW: 12.9 % (ref 11.0–15.0)
Total Lymphocyte: 19.6 %
WBC: 8.4 10*3/uL (ref 3.8–10.8)

## 2020-09-28 LAB — COMPLETE METABOLIC PANEL WITH GFR
AG Ratio: 1.4 (calc) (ref 1.0–2.5)
ALT: 16 U/L (ref 6–29)
AST: 23 U/L (ref 10–35)
Albumin: 3.8 g/dL (ref 3.6–5.1)
Alkaline phosphatase (APISO): 62 U/L (ref 37–153)
BUN/Creatinine Ratio: 20 (calc) (ref 6–22)
BUN: 22 mg/dL (ref 7–25)
CO2: 26 mmol/L (ref 20–32)
Calcium: 8.9 mg/dL (ref 8.6–10.4)
Chloride: 101 mmol/L (ref 98–110)
Creat: 1.09 mg/dL — ABNORMAL HIGH (ref 0.60–0.88)
GFR, Est African American: 53 mL/min/{1.73_m2} — ABNORMAL LOW (ref >=60)
GFR, Est Non African American: 46 mL/min/{1.73_m2} — ABNORMAL LOW (ref >=60)
Globulin: 2.8 g/dL (calc) (ref 1.9–3.7)
Glucose, Bld: 104 mg/dL — ABNORMAL HIGH (ref 65–99)
Potassium: 4.3 mmol/L (ref 3.5–5.3)
Sodium: 137 mmol/L (ref 135–146)
Total Bilirubin: 0.3 mg/dL (ref 0.2–1.2)
Total Protein: 6.6 g/dL (ref 6.1–8.1)

## 2020-09-28 LAB — TSH: TSH: 0.39 mIU/L — ABNORMAL LOW (ref 0.40–4.50)

## 2020-09-28 LAB — VITAMIN D 25 HYDROXY (VIT D DEFICIENCY, FRACTURES): Vit D, 25-Hydroxy: 112 ng/mL — ABNORMAL HIGH (ref 30–100)

## 2020-09-28 LAB — MAGNESIUM: Magnesium: 1.9 mg/dL (ref 1.5–2.5)

## 2020-09-28 LAB — HEMOGLOBIN A1C
Hgb A1c MFr Bld: 6 % of total Hgb — ABNORMAL HIGH (ref <5.7)
Mean Plasma Glucose: 126 mg/dL
eAG (mmol/L): 7 mmol/L

## 2020-09-28 LAB — LIPID PANEL
Cholesterol: 149 mg/dL (ref <200)
HDL: 43 mg/dL — ABNORMAL LOW (ref >=50)
LDL Cholesterol (Calc): 83 mg/dL (calc)
Non-HDL Cholesterol (Calc): 106 mg/dL (calc) (ref <130)
Total CHOL/HDL Ratio: 3.5 (calc) (ref <5.0)
Triglycerides: 134 mg/dL (ref <150)

## 2020-09-28 LAB — INSULIN, RANDOM: Insulin: 30.3 u[IU]/mL — ABNORMAL HIGH

## 2020-09-28 NOTE — Progress Notes (Signed)
============================================================ ============================================================  -    Chronic Kidney Disease 3a is stable                                      (& actually improved slightly) over the last 3 years   - Mild chronic anemia likewise is stable  ============================================================ ============================================================  -  Total Chol = 149 and LDL = 83 - Both  Excellent   - Very low risk for Heart Attack  / Stroke ========================================================  - A1c = 6.0% - is borderline elevated in the  early or pre-diabetes range which has the same   300% increased risk for heart attack, stroke, cancer and   alzheimer- type vascular dementia as full blown diabetes.   But the good news is that diet, exercise with  weight loss can cure the early diabetes at this point.  So . . . . Marland Kitchen  - Avoid Sweets, Candy & White Stuff   - White Rice, White Simpson, White Flour  - Breads &  Pasta ============================================================ ============================================================  - Vit D = 112  - Great - OK up to 115 , So please keep dose same  ============================================================ ============================================================  - All Else - CBC - Kidneys - Electrolytes - Liver - Magnesium & Thyroid    - all  Normal / OK ============================================================ ============================================================

## 2020-10-05 DIAGNOSIS — J302 Other seasonal allergic rhinitis: Secondary | ICD-10-CM | POA: Diagnosis not present

## 2020-10-05 DIAGNOSIS — H93233 Hyperacusis, bilateral: Secondary | ICD-10-CM | POA: Diagnosis not present

## 2020-10-05 DIAGNOSIS — H6983 Other specified disorders of Eustachian tube, bilateral: Secondary | ICD-10-CM | POA: Diagnosis not present

## 2020-10-05 DIAGNOSIS — R42 Dizziness and giddiness: Secondary | ICD-10-CM | POA: Diagnosis not present

## 2020-10-05 DIAGNOSIS — M26621 Arthralgia of right temporomandibular joint: Secondary | ICD-10-CM | POA: Diagnosis not present

## 2020-10-07 ENCOUNTER — Other Ambulatory Visit: Payer: Self-pay | Admitting: Internal Medicine

## 2020-10-07 DIAGNOSIS — I10 Essential (primary) hypertension: Secondary | ICD-10-CM

## 2020-10-12 ENCOUNTER — Telehealth: Payer: Self-pay | Admitting: *Deleted

## 2020-10-12 NOTE — Telephone Encounter (Signed)
Patient called and complained of leg cramps at night. Patient currently takes Magnesium 250 mg twice daily and Vitamin D 100000 units daily. Per Dr Melford Aase, patient can try Benadryl 25 mg 2 tablets at bedtime. Patient states Benadryl makes her very tired the next day and she will try 25 mg at bedtime.

## 2020-11-16 ENCOUNTER — Telehealth: Payer: Self-pay

## 2020-11-16 NOTE — Telephone Encounter (Signed)
Patient states that she believes that her atenolol is causing coughing and mucus that is clear. No appointment available to be seen today.

## 2020-11-17 NOTE — Telephone Encounter (Signed)
Left message on voice mail  to call back

## 2020-11-28 ENCOUNTER — Telehealth: Payer: Self-pay

## 2020-11-28 ENCOUNTER — Other Ambulatory Visit: Payer: Self-pay | Admitting: Adult Health

## 2020-11-28 NOTE — Telephone Encounter (Signed)
Referral request to seen Dr. Molli Posey for vaginal discharge. Doesn't want to come into our office. Please advise.

## 2020-11-28 NOTE — Telephone Encounter (Signed)
Left message on voice mail  to call back

## 2020-11-30 DIAGNOSIS — M1812 Unilateral primary osteoarthritis of first carpometacarpal joint, left hand: Secondary | ICD-10-CM | POA: Diagnosis not present

## 2020-12-12 ENCOUNTER — Ambulatory Visit: Payer: Self-pay

## 2020-12-12 ENCOUNTER — Other Ambulatory Visit: Payer: Self-pay | Admitting: Internal Medicine

## 2020-12-12 DIAGNOSIS — E039 Hypothyroidism, unspecified: Secondary | ICD-10-CM

## 2020-12-12 MED ORDER — SYNTHROID 150 MCG PO TABS: ORAL_TABLET | ORAL | 0 refills | Status: DC

## 2020-12-14 DIAGNOSIS — M5431 Sciatica, right side: Secondary | ICD-10-CM | POA: Diagnosis not present

## 2020-12-19 ENCOUNTER — Encounter: Payer: Self-pay | Admitting: Nurse Practitioner

## 2020-12-19 ENCOUNTER — Other Ambulatory Visit: Payer: Self-pay

## 2020-12-19 ENCOUNTER — Ambulatory Visit (INDEPENDENT_AMBULATORY_CARE_PROVIDER_SITE_OTHER): Payer: Medicare Other | Admitting: Nurse Practitioner

## 2020-12-19 VITALS — BP 115/63 | HR 62 | Temp 97.3°F | Resp 16 | Ht 66.0 in | Wt 157.4 lb

## 2020-12-19 DIAGNOSIS — B029 Zoster without complications: Secondary | ICD-10-CM | POA: Diagnosis not present

## 2020-12-19 MED ORDER — GABAPENTIN 100 MG PO CAPS: 100.0000 mg | ORAL_CAPSULE | Freq: Three times a day (TID) | ORAL | 2 refills | Status: DC

## 2020-12-19 MED ORDER — VALACYCLOVIR HCL 1 G PO TABS: 1000.0000 mg | ORAL_TABLET | Freq: Three times a day (TID) | ORAL | 0 refills | Status: DC

## 2020-12-19 NOTE — Progress Notes (Signed)
Assessment and Plan:  There are no diagnoses linked to this encounter.   Mikayla Harrison was seen today for rash on leg and lower back with associated pain.  Diagnoses and all orders for this visit:  Herpes zoster without complication -     valACYclovir (VALTREX) 1000 MG tablet; Take 1 tablet (1,000 mg total) by mouth 3 (three) times daily. Take for 7 days -     gabapentin (NEURONTIN) 100 MG capsule; Take 1 capsule (100 mg total) by mouth 3 (three) times daily.      Further disposition pending results of labs. Discussed med's effects and SE's.   Over 20 minutes of exam, counseling, chart review, and critical decision making was performed.   Future Appointments  Date Time Provider [place]  02/09/2021  3:00 PM [name], MD [facility] None  06/19/2021  2:30 PM McClanahan, Danton Sewer, NP [facility] None    ------------------------------------------------------------------------------------------------------------------   HPI BP 115/63 (BP Location: Right Arm, Patient Position: Sitting, Cuff Size: Normal)   Pulse 62   Temp (!) 97.3 F (36.3 C)   Resp 16   Ht 5\' 6"  (1.676 m)   Wt 157 lb 6.4 oz (71.4 kg)   SpO2 98%   BMI 25.41 kg/m  87 y.o.female presents for pain in right knee which began approximately 7days ago ,then yesterday she noticed little blisters appearing on her right knee and spreading up right leg and right lower back . The areas are very painful to the touch- states pain is 10/10 when anything contacts the areas, unable to sleep   She has a history of shingles in right leg approx 10 years ago   Lab Results  Component Value Date   GFRAA 53 (L) 09/27/2020    Past Medical History:  Diagnosis Date   CKD (chronic kidney disease) stage 3, GFR 30-59 ml/min (HCC)    Colon polyp    Diverticulosis    Hypertension    Iron deficiency    Prediabetes    Prediabetes    Small bowel obstruction (HCC)      Allergies  Allergen Reactions   Sulfa Antibiotics Other (See  Comments)   Asa [Aspirin] Other (See Comments)    Take 81mg  cannot take 325mg  gets nervous   Codeine Nausea Only   Prednisone Nausea And Vomiting    Current Outpatient Medications on File Prior to Visit  Medication Sig   acetaminophen (TYLENOL) 325 MG tablet Take 325 mg by mouth every 6 (six) hours as needed.   amLODipine (NORVASC) 10 MG tablet TAKE 1/2 TO 1 (ONE-HALF TO ONE) TABLET BY MOUTH ONCE DAILY FOR BLOOD PRESSURE   Ascorbic Acid (VITAMIN C PO) Take 1 tablet by mouth daily.   aspirin 81 MG tablet Take 81 mg by mouth daily.   atenolol (TENORMIN) 50 MG tablet Take 1 tablet by mouth once daily for blood pressure   benazepril (LOTENSIN) 40 MG tablet Take  1 tablet  Daily  for BP   Cholecalciferol (VITAMIN D3) 1000 units CAPS Take 1,000 Units by mouth 2 (two) times daily.   clorazepate (TRANXENE) 7.5 MG tablet TAKE 1/2 TO 1 (ONE-HALF TO ONE) TABLET BY MOUTH 2 TO 3 TIMES DAILY AS NEEDED FOR ANXIETY   Magnesium 500 MG TABS Take 2 tablets by mouth daily.   meclizine (ANTIVERT) 25 MG tablet TAKE 1/2 TO 1 (ONE-HALF TO ONE) TABLET BY MOUTH THREE TIMES DAILY FOR  MOTION  SICKNESS  OR  DIZZINESS   SYNTHROID 150 MCG tablet Take  1 tablet  Daily  on an empty stomach with only water for 30 minutes & no Antacid meds, Calcium or Magnesium for 4 hours & avoid Biotin   vitamin B-12 (CYANOCOBALAMIN) 500 MCG tablet Take 500 mcg by mouth daily.   zinc gluconate 50 MG tablet Take 50 mg by mouth. Takes 1/2 tablet daily.   No current facility-administered medications on file prior to visit.    ROS: all negative except above.   Physical Exam:  BP 115/63 (BP Location: Right Arm, Patient Position: Sitting, Cuff Size: Normal)   Pulse 62   Temp (!) 97.3 F (36.3 C)   Resp 16   Ht 5\' 6"  (1.676 m)   Wt 157 lb 6.4 oz (71.4 kg)   SpO2 98%   BMI 25.41 kg/m   General Appearance: Well nourished, in no apparent distress. Eyes: PERRLA, EOMs, conjunctiva no swelling or erythema Sinuses: No Frontal/maxillary  tenderness ENT/Mouth: Ext aud canals clear, TMs without erythema, bulging. No erythema, swelling, or exudate on post pharynx.  Tonsils not swollen or erythematous. Hearing normal.  Neck: Supple, thyroid normal.  Respiratory: Respiratory effort normal, BS equal bilaterally without rales, rhonchi, wheezing or stridor.  Cardio: RRR with no MRGs. Brisk peripheral pulses without edema.  Abdomen: Soft, + BS.  Non tender, no guarding, rebound, hernias, masses. Lymphatics: Non tender without lymphadenopathy.  Musculoskeletal: Full ROM, 5/5 strength, normal gait.  Skin: erythema and blisters of right calf/ knee/thigh and lower back, painful to touch  Neuro: Cranial nerves intact. Normal muscle tone, no cerebellar symptoms. Sensation intact.  Psych: Awake and oriented X 3, normal affect, Insight and Judgment appropriate.     [name], NP 3:34 PM [facility]

## 2020-12-19 NOTE — Patient Instructions (Addendum)
Acyclovir Cream or Ointment What is this medication? ACYCLOVIR (ay SYE kloe veer) helps manage infections, such as cold sores and genital herpes, caused by viruses. This medication does not kill the virus and it may still be possible to spread the virus to others. This medication willnot treat colds, the flu, or infections caused by bacteria. This medicine may be used for other purposes; ask your health care provider orpharmacist if you have questions. COMMON BRAND NAME(S): Zovirax What should I tell my care team before I take this medication? They need to know if you have any of these conditions: Large areas of burned or damaged skin An unusual or allergic reaction to acyclovir, other medications, foods, dyes, or preservatives Pregnant or trying to get pregnant Breast-feeding How should I use this medication? This medication is only for external use on the skin. Follow the directions on the prescription label. Wash hands before and after use. Use a finger cot or rubber gloves so that you do not spread the infection to another site. Apply a thin layer to cover all lesions. Do not get this medication in your eyes. If you do, rinse out with plenty of cool tap water. Use this medication at regular intervals. Do not use more often than directed. Finish the full course prescribed by your care team even if you think you are better. Do not stopusing except on your care team's advice. Talk to your care team regarding the use of this medication in children.Special care may be needed. Overdosage: If you think you have taken too much of this medicine contact apoison control center or emergency room at once. NOTE: This medicine is only for you. Do not share this medicine with others. What if I miss a dose? If you miss a dose, use it as soon as you can. If it is almost time for yournext dose, use only that dose. Do not use double or extra doses. What may interact with this medication? Interactions are not  expected. Do not apply other skin care products (cosmetics, lip balm, or sunscreens) to the sore unless your care team tellsyou to. This list may not describe all possible interactions. Give your health care provider a list of all the medicines, herbs, non-prescription drugs, or dietary supplements you use. Also tell them if you smoke, drink alcohol, or use illegaldrugs. Some items may interact with your medicine. What should I watch for while using this medication? Visit your care team for regular checks on your progress. Tell your care teamif your symptoms do not start to get better or if they get worse. If you are being treated for a sexually transmitted infection (STI), avoid sexual contact until you have finished your treatment. Your sexual partner mayalso need treatment. What side effects may I notice from receiving this medication? Side effects that you should report to your care team as soon as possible: Allergic reactions-skin rash, itching, hives, swelling of the face, lips, tongue, throat Side effects that usually do not require medical attention (report to your careteam if they continue or are bothersome): Burning, itching, crusting, or peeling of treated skin This list may not describe all possible side effects. Call your doctor for medical advice about side effects. You may report side effects to FDA at1-800-FDA-1088. Where should I keep my medication? Keep out of the reach of children and pets. Store at room temperature between 15 and 25 degrees C (59 and 77 degrees F). Protect from moisture. Keep the container tightly closed. Get rid of any  unusedmedication after the expiration date. NOTE: This sheet is a summary. It may not cover all possible information. If you have questions about this medicine, talk to your doctor, pharmacist, orhealth care provider.  2022 Elsevier/Gold Standard (2020-04-21 14:13:19)      Shingles  Shingles is an infection. It gives you a painful skin rash  and blisters that have fluid in them. Shingles is caused by the same germ (virus) that causes chickenpox. Shingles only happens in people who: Have had chickenpox. Have been given a shot (vaccine) to protect against chickenpox. Shingles is rare in this group. What are the causes? This condition is caused by varicella-zoster virus. This is the same germ that causes chickenpox. After a person is exposed to the germ, the germ stays in the body but is not active (dormant). Shingles develops if the germ becomes active again (is reactivated). This can happen many years after the first exposure to the germ. It is notknown what causes this germ to become active again. What increases the risk? People who have had chickenpox or received the chickenpox shot are at risk for shingles. This infection is more common in people who: Are older than 85 years of age. Have a weakened disease-fighting system (immune system), such as people with: HIV (human immunodeficiency virus). AIDS (acquired immunodeficiency syndrome). Cancer. Are taking medicines that weaken the immune system, such as organ transplant medicines. Have a lot of stress. What are the signs or symptoms? The first symptoms of shingles may be itching, tingling, or pain in an area onyour skin. A rash will show on your skin a few days or weeks later. This is what usually happens: The rash is likely to be on one side of your body. The rash usually has a shape like a belt or a band. Over time, the rash turns into fluid-filled blisters. The blisters will break open and change into scabs. The scabs usually dry up in about 2-3 weeks. You may also have: A fever. Chills. A headache. A feeling like you may vomit (nausea). How is this treated? The rash may last for several weeks. There is not a specific cure for thiscondition. Your doctor may prescribe medicines. Medicines may: Help with pain. Help you get better sooner. Help to prevent long-term  problems. Help with itching (antihistamines). If the area involved is on your face, you may need to see a specialist. Thismay be an eye doctor or an ear, nose, and throat (ENT) doctor. Follow these instructions at home: Medicines Take over-the-counter and prescription medicines only as told by your doctor. Put on an anti-itch cream or numbing cream where you have a rash, blisters, or scabs. Do this as told by your doctor. Helping with itching and discomfort  Put cold, wet cloths (cold compresses) on the area of the rash or blisters as told by your doctor. Cool baths can help you feel better. Try adding baking soda or dry oatmeal to the water to lessen itching. Do not bathe in hot water. Use calamine lotion as told by your doctor.  Blister and rash care Keep your rash covered with a loose bandage (dressing). Wear loose clothing that does not rub on your rash. Wash your hands with soap and water for at least 20 seconds before and after you change your bandage. If you cannot use soap and water, use hand sanitizer. Change your bandage as told by your doctor. Keep your rash and blisters clean. To do this, wash the area with mild soap and cool water  as told by your doctor. Check your rash every day for signs of infection. Check for: More redness, swelling, or pain. Fluid or blood. Warmth. Pus or a bad smell. Do not scratch your rash. Do not pick at your blisters. To help you to not scratch: Keep your fingernails clean and cut short. Wear gloves or mittens when you sleep, if scratching is a problem. General instructions Rest as told by your doctor. Wash your hands often with soap and water for at least 20 seconds. If you cannot use soap and water, use hand sanitizer. Doing this lowers your chance of getting a skin infection. Your infection can cause chickenpox in people who have never had chickenpox or never got a chickenpox vaccine shot. If you have blisters that did not change into scabs  yet, try not to touch other people or be around other people, especially: Babies. Pregnant women. Children who have areas of red, itchy, or rough skin (eczema). Older people who have organ transplants. People who have a long-term (chronic) illness, like cancer or AIDS. Keep all follow-up visits. How is this prevented? A vaccine shot is the best way to prevent shingles and protect against shinglesproblems. If you have not had a vaccine shot, talk with your doctor about getting it. Where to find more information Centers for Disease Control and Prevention: http://www.wolf.info/ Contact a doctor if: Your pain does not get better with medicine. Your pain does not get better after the rash heals. You have any of these signs of infection around the rash: More redness, swelling, or pain. Fluid or blood. Warmth. Pus or a bad smell. You have a fever. Get help right away if: The rash is on your face or nose. You have pain in your face or pain by your eye. You lose feeling on one side of your face. You have trouble seeing. You have ear pain, or you have ringing in your ear. You have a loss of taste. Your condition gets worse. Summary Shingles gives you a painful skin rash and blisters that have fluid in them. Shingles is caused by the same germ (virus) that causes chickenpox. Keep your rash covered with a loose bandage. Wear loose clothing that does not rub on your rash. If you have blisters that did not change into scabs yet, try not to touch other people or be around people. This information is not intended to replace advice given to you by your health care provider. Make sure you discuss any questions you have with your healthcare provider. Document Revised: 05/23/2020 Document Reviewed: 05/23/2020 Elsevier Patient Education  2022 [employer].

## 2020-12-21 ENCOUNTER — Other Ambulatory Visit: Payer: Self-pay | Admitting: Nurse Practitioner

## 2020-12-21 DIAGNOSIS — B029 Zoster without complications: Secondary | ICD-10-CM

## 2020-12-21 MED ORDER — DEXAMETHASONE 1 MG PO TABS: ORAL_TABLET | ORAL | 0 refills | Status: DC

## 2020-12-29 NOTE — Progress Notes (Deleted)
Assessment and Plan:  There are no diagnoses linked to this encounter.    Further disposition pending results of labs. Discussed med's effects and SE's.   Over 30 minutes of exam, counseling, chart review, and critical decision making was performed.   Future Appointments  Date Time Provider [place]  01/02/2021 10:30 AM [name], NP [facility] None  02/09/2021  3:00 PM [name], MD [facility] None  06/19/2021  2:30 PM Garnet Sierras, NP [facility] None    ------------------------------------------------------------------------------------------------------------------   HPI There were no vitals taken for this visit. 87 y.o.female presents for neuropathy  Past Medical History:  Diagnosis Date   CKD (chronic kidney disease) stage 3, GFR 30-59 ml/min (HCC)    Colon polyp    Diverticulosis    Hypertension    Iron deficiency    Prediabetes    Prediabetes    Small bowel obstruction (HCC)      Allergies  Allergen Reactions   Sulfa Antibiotics Other (See Comments)   Asa [Aspirin] Other (See Comments)    Take 81mg  cannot take 325mg  gets nervous   Codeine Nausea Only   Prednisone Nausea And Vomiting    Current Outpatient Medications on File Prior to Visit  Medication Sig   acetaminophen (TYLENOL) 325 MG tablet Take 325 mg by mouth every 6 (six) hours as needed.   amLODipine (NORVASC) 10 MG tablet TAKE 1/2 TO 1 (ONE-HALF TO ONE) TABLET BY MOUTH ONCE DAILY FOR BLOOD PRESSURE   Ascorbic Acid (VITAMIN C PO) Take 1 tablet by mouth daily.   aspirin 81 MG tablet Take 81 mg by mouth daily.   atenolol (TENORMIN) 50 MG tablet Take 1 tablet by mouth once daily for blood pressure   benazepril (LOTENSIN) 40 MG tablet Take  1 tablet  Daily  for BP   Cholecalciferol (VITAMIN D3) 1000 units CAPS Take 1,000 Units by mouth 2 (two) times daily.   clorazepate (TRANXENE) 7.5 MG tablet TAKE 1/2 TO 1 (ONE-HALF TO ONE) TABLET BY MOUTH 2 TO 3 TIMES DAILY AS NEEDED FOR ANXIETY    dexamethasone (DECADRON) 1 MG tablet Take 1 tabs 5 days. 1/2 tab x 6 days Take with food.   gabapentin (NEURONTIN) 100 MG capsule Take 1 capsule (100 mg total) by mouth 3 (three) times daily.   Magnesium 500 MG TABS Take 2 tablets by mouth daily.   meclizine (ANTIVERT) 25 MG tablet TAKE 1/2 TO 1 (ONE-HALF TO ONE) TABLET BY MOUTH THREE TIMES DAILY FOR  MOTION  SICKNESS  OR  DIZZINESS   SYNTHROID 150 MCG tablet Take  1 tablet  Daily  on an empty stomach with only water for 30 minutes & no Antacid meds, Calcium or Magnesium for 4 hours & avoid Biotin   valACYclovir (VALTREX) 1000 MG tablet Take 1 tablet (1,000 mg total) by mouth 3 (three) times daily. Take for 7 days   vitamin B-12 (CYANOCOBALAMIN) 500 MCG tablet Take 500 mcg by mouth daily.   zinc gluconate 50 MG tablet Take 50 mg by mouth. Takes 1/2 tablet daily.   No current facility-administered medications on file prior to visit.    ROS: all negative except above.   Physical Exam:  There were no vitals taken for this visit.  General Appearance: Well nourished, in no apparent distress. Eyes: PERRLA, EOMs, conjunctiva no swelling or erythema Sinuses: No Frontal/maxillary tenderness ENT/Mouth: Ext aud canals clear, TMs without erythema, bulging. No erythema, swelling, or exudate on post pharynx.  Tonsils not swollen or erythematous. Hearing  normal.  Neck: Supple, thyroid normal.  Respiratory: Respiratory effort normal, BS equal bilaterally without rales, rhonchi, wheezing or stridor.  Cardio: RRR with no MRGs. Brisk peripheral pulses without edema.  Abdomen: Soft, + BS.  Non tender, no guarding, rebound, hernias, masses. Lymphatics: Non tender without lymphadenopathy.  Musculoskeletal: Full ROM, 5/5 strength, normal gait.  Skin: Warm, dry without rashes, lesions, ecchymosis.  Neuro: Cranial nerves intact. Normal muscle tone, no cerebellar symptoms. Sensation intact.  Psych: Awake and oriented X 3, normal affect, Insight and Judgment  appropriate.     [name], NP 9:51 AM [facility]

## 2021-01-02 ENCOUNTER — Ambulatory Visit: Payer: Medicare Other | Admitting: Nurse Practitioner

## 2021-01-02 DIAGNOSIS — M792 Neuralgia and neuritis, unspecified: Secondary | ICD-10-CM

## 2021-01-02 DIAGNOSIS — Z79899 Other long term (current) drug therapy: Secondary | ICD-10-CM

## 2021-01-02 DIAGNOSIS — E039 Hypothyroidism, unspecified: Secondary | ICD-10-CM

## 2021-01-02 DIAGNOSIS — E782 Mixed hyperlipidemia: Secondary | ICD-10-CM

## 2021-01-02 DIAGNOSIS — N1832 Chronic kidney disease, stage 3b: Secondary | ICD-10-CM

## 2021-01-02 DIAGNOSIS — E559 Vitamin D deficiency, unspecified: Secondary | ICD-10-CM

## 2021-01-02 DIAGNOSIS — R7309 Other abnormal glucose: Secondary | ICD-10-CM

## 2021-01-02 DIAGNOSIS — I10 Essential (primary) hypertension: Secondary | ICD-10-CM

## 2021-02-09 ENCOUNTER — Ambulatory Visit (INDEPENDENT_AMBULATORY_CARE_PROVIDER_SITE_OTHER): Payer: Medicare Other | Admitting: Internal Medicine

## 2021-02-09 ENCOUNTER — Encounter: Payer: Self-pay | Admitting: Internal Medicine

## 2021-02-09 ENCOUNTER — Other Ambulatory Visit: Payer: Self-pay

## 2021-02-09 VITALS — BP 123/68 | HR 67 | Temp 97.3°F | Resp 16 | Ht 66.0 in | Wt 160.2 lb

## 2021-02-09 DIAGNOSIS — Z136 Encounter for screening for cardiovascular disorders: Secondary | ICD-10-CM | POA: Diagnosis not present

## 2021-02-09 DIAGNOSIS — Z0000 Encounter for general adult medical examination without abnormal findings: Secondary | ICD-10-CM

## 2021-02-09 DIAGNOSIS — N1832 Chronic kidney disease, stage 3b: Secondary | ICD-10-CM

## 2021-02-09 DIAGNOSIS — Z1211 Encounter for screening for malignant neoplasm of colon: Secondary | ICD-10-CM

## 2021-02-09 DIAGNOSIS — R7309 Other abnormal glucose: Secondary | ICD-10-CM

## 2021-02-09 DIAGNOSIS — Z87891 Personal history of nicotine dependence: Secondary | ICD-10-CM

## 2021-02-09 DIAGNOSIS — Z0001 Encounter for general adult medical examination with abnormal findings: Secondary | ICD-10-CM

## 2021-02-09 DIAGNOSIS — Z8249 Family history of ischemic heart disease and other diseases of the circulatory system: Secondary | ICD-10-CM | POA: Diagnosis not present

## 2021-02-09 DIAGNOSIS — I10 Essential (primary) hypertension: Secondary | ICD-10-CM | POA: Diagnosis not present

## 2021-02-09 DIAGNOSIS — E782 Mixed hyperlipidemia: Secondary | ICD-10-CM

## 2021-02-09 DIAGNOSIS — Z1212 Encounter for screening for malignant neoplasm of rectum: Secondary | ICD-10-CM

## 2021-02-09 DIAGNOSIS — R7303 Prediabetes: Secondary | ICD-10-CM

## 2021-02-09 DIAGNOSIS — E559 Vitamin D deficiency, unspecified: Secondary | ICD-10-CM

## 2021-02-09 NOTE — Patient Instructions (Signed)

## 2021-02-09 NOTE — Progress Notes (Signed)
Annual Screening/Preventative Visit & Comprehensive Evaluation &  Examination  Future Appointments  Date Time Provider [place]  02/09/2021  3:00 PM [name], MD [facility] None  06/19/2021  2:30 PM [name], NP [facility] None  02/13/2022  3:00 PM [name], MD [facility] None        This very nice 87 y.o.  WWF presents for a Screening /Preventative Visit & comprehensive evaluation and management of multiple medical co-morbidities.  Patient has been followed for HTN, HLD, Prediabetes  and Vitamin D Deficiency.        HTN predates circa 1986. Patient's BP has been controlled at home.  Patient has CKD3b (GFR 41) consequent of her AS & HTCVD.  Patient denies any cardiac symptoms as chest pain, palpitations, shortness of breath, dizziness or ankle swelling. Today's BP is at goal - 123/68 .       Patient's hyperlipidemia is controlled with diet and medications. Patient denies myalgias or other medication SE's. Last lipids were at goal;  Lab Results  Component Value Date   CHOL 149 09/27/2020   HDL 43 (L) 09/27/2020   LDLCALC 83 09/27/2020   TRIG 134 09/27/2020   CHOLHDL 3.5 09/27/2020        Patient has hx/o prediabetes  (A1c 5.7% /2014 and 5.8% /2018) and patient denies reactive hypoglycemic symptoms, visual blurring, diabetic polys or paresthesias. Last A1c was still not at goal:  Lab Results  Component Value Date   HGBA1C 6.0 (H) 09/27/2020        Finally, patient has history of Vitamin D Deficiency ("36" /2009 & "29" /2016)  and last Vitamin D was borderline elevated and dose was tapered:  Lab Results  Component Value Date   VD25OH 112 (H) 09/27/2020    Current Outpatient Medications on File Prior to Visit  Medication Sig   acetaminophen (TYLENOL) 325 MG tablet Take  every 6 hours as needed.   amLODipine (NORVASC) 10 MG tablet TAKE 1/2 TO 1 DAILY    Ascorbic Acid (VITAMIN C PO) Take 1 tablet  daily.   aspirin 81 MG tablet Take  daily.    atenolol (TENORMIN) 50 MG tablet Take 1 tablet once daily for blood pressure   benazepril (LOTENSIN) 40 MG tablet Take  1 tablet  Daily  for BP   Cholecalciferol (VITAMIN D3) 1000 units CAPS Take 1,000 Units  2 times daily.   clorazepate (TRANXENE) 7.5 MG tablet Take 1/2 TO 1  tab 2 -3 x /day as needed    gabapentin (NEURONTIN) 100 MG capsule Take 1 capsule 3 times daily.   Magnesium 500 MG TABS Take 2 tablets by   daily.   meclizine (25 MG tablet TAKE 1/2 TO 1 TAB  3 x /da as needed   SYNTHROID 150 MCG tablet Take  1 tablet  Daily     valACYclovir (VALTREX) 1000 MG tablet Take 1 tablet 3 times daily. Take for 7 days   vitamin B-12  500 MCG tablet Take  daily.   zinc 50 MG tablet  Takes 1/2 tablet daily.    Allergies  Allergen Reactions   Sulfa Antibiotics Other (See Comments)   Asa [Aspirin] Other (See Comments)    cannot take '325mg'$  gets nervous   Codeine Nausea Only   Prednisone Nausea And Vomiting     Past Medical History:  Diagnosis Date   CKD (chronic kidney disease) stage 3, GFR 30-59 ml/min (HCC)    Colon polyp    Diverticulosis  Hypertension    Iron deficiency    Prediabetes    Prediabetes    Small bowel obstruction [facility])     Health Maintenance  Topic Date Due   COVID-19 Vaccine (1) Never done   Zoster Vaccines- Shingrix (1 of 2) Never done   INFLUENZA VACCINE  01/09/2021   TETANUS/TDAP  10/11/2027   DEXA SCAN  Completed   PNA vac Low Risk Adult  Completed   HPV VACCINES  Aged Out    Immunization History  Administered Date(s) Administered   DT (Pediatric) 03/22/2015   Influenza Split 03/04/2013   Influenza, High Dose Seasonal PF 03/16/2014, 03/22/2015, 02/15/2016, 04/23/2017, 04/30/2018, 04/08/2019   Pneumococcal Polysaccharide-23 03/16/2014   Td 10/10/2017    Last Colon - 01/09/2011 - Dr Henrene Pastor - Tubovillous adenoma polyp & was recommend 3-6 mo f/u repeat Colonoscopy which patient  has repeatedly refused .   Last MGM - 01/25/2010- patient refuses to  schedule f/u MGM   Past Surgical History:  Procedure Laterality Date   CATARACT EXTRACTION     EYE SURGERY     MACULAR TEAR     Vericose vein  1960     Family History  Problem Relation Age of Onset   Heart disease Mother    Stroke Mother    Heart disease Father    Colon polyps Sister    Diabetes Sister    Stroke Sister    Heart disease Brother    Stroke Brother    Hypertension Son      Social History   Tobacco Use   Smoking status: Former    Packs/day: 1.00    Years: 15.00    Pack years: 15.00    Types: Cigarettes    Quit date: 06/11/1965    Years since quitting: 55.7   Smokeless tobacco: Never  Vaping Use   Vaping Use: Never used  Substance Use Topics   Alcohol use: No   Drug use: No      ROS Constitutional: Denies fever, chills, weight loss/gain, headaches, insomnia,  night sweats, and change in appetite. Does c/o fatigue. Eyes: Denies redness, blurred vision, diplopia, discharge, itchy, watery eyes.  ENT: Denies discharge, congestion, post nasal drip, epistaxis, sore throat, earache, hearing loss, dental pain, Tinnitus, Vertigo, Sinus pain, snoring.  Cardio: Denies chest pain, palpitations, irregular heartbeat, syncope, dyspnea, diaphoresis, orthopnea, PND, claudication, edema Respiratory: denies cough, dyspnea, DOE, pleurisy, hoarseness, laryngitis, wheezing.  Gastrointestinal: Denies dysphagia, heartburn, reflux, water brash, pain, cramps, nausea, vomiting, bloating, diarrhea, constipation, hematemesis, melena, hematochezia, jaundice, hemorrhoids Genitourinary: Denies dysuria, frequency, urgency, nocturia, hesitancy, discharge, hematuria, flank pain Breast: Breast lumps, nipple discharge, bleeding.  Musculoskeletal: Denies arthralgia, myalgia, stiffness, Jt. Swelling, pain, limp, and strain/sprain. Denies falls. Skin: Denies puritis, rash, hives, warts, acne, eczema, changing in skin lesion Neuro: No weakness, tremor, incoordination, spasms, paresthesia,  pain Psychiatric: Denies confusion, memory loss, sensory loss. Denies Depression. Endocrine: Denies change in weight, skin, hair change, nocturia, and paresthesia, diabetic polys, visual blurring, hyper / hypo glycemic episodes.  Heme/Lymph: No excessive bleeding, bruising, enlarged lymph nodes.  Physical Exam  BP 123/68   Pulse 67   Temp (!) 97.3 F (36.3 C)   Resp 16   Ht '5\' 6"'$  (1.676 m)   Wt 160 lb 3.2 oz (72.7 kg)   SpO2 99%   BMI 25.86 kg/m   General Appearance: Well nourished  and in no apparent distress.  Eyes: PERRLA, EOMs, conjunctiva no swelling or erythema, normal fundi and vessels. Sinuses: No frontal/maxillary tenderness  ENT/Mouth: EACs patent / TMs  nl. Nares clear without erythema, swelling, mucoid exudates. Oral hygiene is good. No erythema, swelling, or exudate. Tongue normal, non-obstructing. Tonsils not swollen or erythematous. Hearing normal.  Neck: Supple, thyroid not palpable. No bruits, nodes or JVD. Respiratory: Respiratory effort normal.  BS equal and clear bilateral without rales, rhonci, wheezing or stridor. Cardio: Heart sounds are normal with regular rate and rhythm and no murmurs, rubs or gallops. Peripheral pulses are normal and equal bilaterally without edema. No aortic or femoral bruits. Chest: symmetric with normal excursions and percussion. Breasts: Declined exam. Abdomen: Flat, soft with bowel sounds active. Nontender, no guarding, rebound, hernias, masses, or organomegaly.  Lymphatics: Non tender without lymphadenopathy.   Musculoskeletal: Full ROM all peripheral extremities, joint stability, 5/5 strength, and normal gait. Skin: Warm and dry without rashes, lesions, cyanosis, clubbing or  ecchymosis.  Neuro: Cranial nerves intact, reflexes equal bilaterally. Normal muscle tone, no cerebellar symptoms. Sensation intact.  Pysch: Alert and oriented X 3, normal affect, Insight and Judgment appropriate.    Assessment and Plan  1. Annual  Preventative Screening Examination   2. Essential hypertension  - EKG 12-Lead - Microalbumin / creatinine urine ratio - CBC with Differential/Platelet - COMPLETE METABOLIC PANEL WITH GFR - Magnesium  3. Hyperlipidemia, mixed  - EKG 12-Lead - Lipid panel - TSH  4. Abnormal glucose  - EKG 12-Lead - Hemoglobin A1c - Insulin, random  5. Vitamin D deficiency  - VITAMIN D 25 Hydroxy   6. Stage 3b chronic kidney disease (HCC)  - Microalbumin / creatinine urine ratio - PTH, intact and calcium  7. Screening for colorectal cancer  - POC Hemoccult Bld/Stl  - COMPLETE METABOLIC PANEL WITH GFR  8. Screening for ischemic heart disease  - EKG 12-Lead  9. FHx: heart disease  - EKG 12-Lead  10. Former smoker  - EKG 12-Lead - CBC with Differential/Platelet - COMPLETE METABOLIC PANEL WITH GFR - Magnesium - Lipid panel - TSH - Hemoglobin A1c - Insulin, random - VITAMIN D 25 Hydroxy          Patient was counseled in prudent diet to achieve/maintain BMI less than 25 for weight control, BP monitoring, regular exercise and medications. Discussed med's effects and SE's. Screening labs and tests as requested with regular follow-up as recommended. Over 40 minutes of exam, counseling, chart review and high complex critical decision making was performed.   Kirtland Bouchard, MD

## 2021-02-10 LAB — COMPLETE METABOLIC PANEL WITHOUT GFR
AG Ratio: 1.5 (calc) (ref 1.0–2.5)
ALT: 10 U/L (ref 6–29)
AST: 19 U/L (ref 10–35)
Albumin: 3.7 g/dL (ref 3.6–5.1)
Alkaline phosphatase (APISO): 65 U/L (ref 37–153)
BUN/Creatinine Ratio: 18 (calc) (ref 6–22)
BUN: 18 mg/dL (ref 7–25)
CO2: 26 mmol/L (ref 20–32)
Calcium: 8.9 mg/dL (ref 8.6–10.4)
Chloride: 101 mmol/L (ref 98–110)
Creat: 1.01 mg/dL — ABNORMAL HIGH (ref 0.60–0.95)
Globulin: 2.5 g/dL (ref 1.9–3.7)
Glucose, Bld: 90 mg/dL (ref 65–99)
Potassium: 4.2 mmol/L (ref 3.5–5.3)
Sodium: 137 mmol/L (ref 135–146)
Total Bilirubin: 0.3 mg/dL (ref 0.2–1.2)
Total Protein: 6.2 g/dL (ref 6.1–8.1)
eGFR: 54 mL/min/1.73m2 — ABNORMAL LOW

## 2021-02-10 LAB — LIPID PANEL
Cholesterol: 142 mg/dL (ref <200)
HDL: 40 mg/dL — ABNORMAL LOW (ref >=50)
LDL Cholesterol (Calc): 83 mg/dL (calc)
Non-HDL Cholesterol (Calc): 102 mg/dL (calc) (ref <130)
Total CHOL/HDL Ratio: 3.6 (calc) (ref <5.0)
Triglycerides: 91 mg/dL (ref <150)

## 2021-02-10 LAB — MAGNESIUM: Magnesium: 2 mg/dL (ref 1.5–2.5)

## 2021-02-10 LAB — CBC WITH DIFFERENTIAL/PLATELET
Absolute Monocytes: 696 {cells}/uL (ref 200–950)
Basophils Absolute: 43 {cells}/uL (ref 0–200)
Basophils Relative: 0.6 %
Eosinophils Absolute: 128 {cells}/uL (ref 15–500)
Eosinophils Relative: 1.8 %
HCT: 31.5 % — ABNORMAL LOW (ref 35.0–45.0)
Hemoglobin: 10 g/dL — ABNORMAL LOW (ref 11.7–15.5)
Lymphs Abs: 1576 {cells}/uL (ref 850–3900)
MCH: 28 pg (ref 27.0–33.0)
MCHC: 31.7 g/dL — ABNORMAL LOW (ref 32.0–36.0)
MCV: 88.2 fL (ref 80.0–100.0)
MPV: 11.4 fL (ref 7.5–12.5)
Monocytes Relative: 9.8 %
Neutro Abs: 4658 {cells}/uL (ref 1500–7800)
Neutrophils Relative %: 65.6 %
Platelets: 220 Thousand/uL (ref 140–400)
RBC: 3.57 Million/uL — ABNORMAL LOW (ref 3.80–5.10)
RDW: 13.8 % (ref 11.0–15.0)
Total Lymphocyte: 22.2 %
WBC: 7.1 Thousand/uL (ref 3.8–10.8)

## 2021-02-10 LAB — HEMOGLOBIN A1C
Hgb A1c MFr Bld: 5.8 % of total Hgb — ABNORMAL HIGH (ref <5.7)
Mean Plasma Glucose: 120 mg/dL
eAG (mmol/L): 6.6 mmol/L

## 2021-02-10 LAB — INSULIN, RANDOM: Insulin: 6.2 u[IU]/mL

## 2021-02-10 LAB — PTH, INTACT AND CALCIUM
Calcium: 8.9 mg/dL (ref 8.6–10.4)
PTH: 34 pg/mL (ref 16–77)

## 2021-02-10 LAB — TSH: TSH: 0.31 mIU/L — ABNORMAL LOW (ref 0.40–4.50)

## 2021-02-10 LAB — VITAMIN D 25 HYDROXY (VIT D DEFICIENCY, FRACTURES): Vit D, 25-Hydroxy: 113 ng/mL — ABNORMAL HIGH (ref 30–100)

## 2021-02-10 LAB — MICROALBUMIN / CREATININE URINE RATIO
Creatinine, Urine: 59 mg/dL (ref 20–275)
Microalb Creat Ratio: 10 ug/mg{creat}
Microalb, Ur: 0.6 mg/dL

## 2021-02-11 NOTE — Progress Notes (Signed)
============================================================ ============================================================  -    PTH hormone that regulates calcium balance is Normal  & OK  ============================================================ ============================================================  -  CBC shows chronic mild Anemia is stable  ============================================================ ============================================================  -  Kidney functions remain Stage 3a  & Stable  ============================================================ ============================================================  -  Total Chol = 142   & LDL Chol = 83    -    Both    Excellent   - Very low risk for Heart Attack  / Stroke ============================================================ ============================================================  -  Thyroid OK - Keep dose same.  ============================================================ ============================================================  -  A1c  - is  a little better - down from 6.0% to Now 5.8%   -  Getting closer to Normal  ( less than 5.7% )  ============================================================ ============================================================  -  Vitamin D = 113 - borderline high  ( OK less than 115)                                                                       - So please keep dose same ============================================================ ============================================================  -  All Else - CBC - Electrolytes - Liver &  Magnesium    - all  Normal / OK ===========================================================

## 2021-02-12 ENCOUNTER — Encounter: Payer: Self-pay | Admitting: Internal Medicine

## 2021-03-15 ENCOUNTER — Other Ambulatory Visit: Payer: Self-pay | Admitting: Internal Medicine

## 2021-03-15 DIAGNOSIS — E039 Hypothyroidism, unspecified: Secondary | ICD-10-CM

## 2021-03-15 MED ORDER — SYNTHROID 150 MCG PO TABS: ORAL_TABLET | ORAL | 3 refills | Status: DC

## 2021-03-21 ENCOUNTER — Other Ambulatory Visit: Payer: Self-pay | Admitting: Internal Medicine

## 2021-03-21 DIAGNOSIS — I10 Essential (primary) hypertension: Secondary | ICD-10-CM

## 2021-03-21 DIAGNOSIS — R002 Palpitations: Secondary | ICD-10-CM

## 2021-04-05 ENCOUNTER — Other Ambulatory Visit: Payer: Self-pay | Admitting: Internal Medicine

## 2021-04-05 DIAGNOSIS — I10 Essential (primary) hypertension: Secondary | ICD-10-CM

## 2021-04-20 ENCOUNTER — Encounter: Payer: Self-pay | Admitting: Internal Medicine

## 2021-06-06 ENCOUNTER — Other Ambulatory Visit: Payer: Self-pay | Admitting: Adult Health Nurse Practitioner

## 2021-06-15 NOTE — Progress Notes (Signed)
MEDICARE ANNUAL WELLNESS VISIT AND FOLLOW UP   Assessment:     Encounter for Medicare annual wellness exam Yearly  Essential hypertension - continue medications, DASH diet, exercise and monitor at home. Call if greater than 130/80.  -check BP in the morning and at 9 oclock - taking benazepril and bASA in the morning, take norvasc 1/2 at 7 without checking BP before that - if your BP is over 140/80 at 10 pm, you can take 1/2 of the atenolol  Hyperlipidemia At goal without medications at this time;  Continue low cholesterol diet and exercise.  Defer lipid panel as would not aggressively treat secondary to age  Abnormal glucose Discussed disease and risks Discussed diet/exercise, weight management  Defer A1C- check CMP for glucose  CKD Stage III (GFR 56 ml/min) Increase fluids, avoid NSAIDS, monitor sugars, will monitor - CMP/GFR  Hypothyroidism, unspecified hypothyroidism type -check TSH level, continue medications the same, reminded to take on an empty stomach 30-33mins before food.    Diverticulosis of intestine without bleeding, unspecified intestinal tract location Controlled, diet   Raynaud phenomenon Declines meds at this time  Overweight Long discussion about weight loss, diet, and exercise Recommended diet heavy in fruits and veggies and low in animal meats, cheeses, and dairy products, appropriate calorie intake Discussed appropriate weight for height Follow up at next visit  History of TIA Control B/P, lipids, glucose bASA  Bone spur of left foot Encouraged to follow up with orthopedics Encouraged supportive shoes  Postmenopausal bleeding Referred to Dr. Willis Modena for evaluation  Edema of lower extremities Encourage compression socks and elevating feet above heart level as much as possible Limit salt intake and push fluids  Estrogen Deficiency DEXA UTD  Candidiasis Lotrimin 1 % cream to area under breasts twice a day until healed Keep area clean  and dry Do not use Hydrocortisone cream  Vitamin D deficiency Continue supplementation to maintain goal of 70-100 Taking Vitamin D 2,000 IU daily Check vitamin D level  Unifocal PVCs  Monitor yearly, check labs  Medication management Continued  Right Eustachian tube dysfunction Mucinex twice a day  Further disposition pending results if labs check today. Discussed med's effects and SE's.   Over 30 minutes of face to face interview, exam, counseling, chart review, and critical decision making was performed.    Future Appointments  Date Time Provider [place]  10/04/2021  2:30 PM [name], NP [facility] None  02/13/2022  3:00 PM [name], MD [facility] None  06/19/2022 11:00 AM [name], NP [facility] None     Plan:   During the course of the visit the patient was educated and counseled about appropriate screening and preventive services including:   Pneumococcal vaccine  Influenza vaccine Td vaccine Screening electrocardiogram Screening mammography Bone densitometry screening Colorectal cancer screening Diabetes screening Glaucoma screening Nutrition counseling  Advanced directives: given info/requested   Subjective:   Mikayla Harrison is a 88 y.o. female who presents for Medicare Annual Wellness Visit and 3 month follow up on hypertension, prediabetes, hyperlipidemia, vitamin D def.   She has had ongoing ear pain at her jaw, causes dizziness, states if she opens and closes her mouth she can "pop" her ear and make it feel better/relieve the issues.    Plantar fascitis and she has bone spurs on the left foot.  She has been to Foot specialist, Dr Earleen Newport. She has used Gabapentin but the medication makes her sick and can not use. Dr Mardelle Matte has put shots  in her knees for arthritis  She has noted a rash under her breast for approximately 6 weeks, very itchy  Has tried hydrocortisone with no relief   She has had vaginal discharge that is blood tinged-  was to be seen by gynecologist but left before her appointment. Needs to be referred  Continues to have swelling in her right lower leg. She has a history of CREST syndrome, may need to follow up with rheumatology.   She lives by herself, has a lot of anxiety at night, has clorazepate if needed, takes 1/2-1/4 tab occasionally.    BMI is Body mass index is 25.44 kg/m., she has not been working on diet and exercise. Wt Readings from Last 3 Encounters:  06/19/21 157 lb 9.6 oz (71.5 kg)  02/09/21 160 lb 3.2 oz (72.7 kg)  12/19/20 157 lb 6.4 oz (71.4 kg)   Her blood pressure has been controlled at home, today their BP is BP: 124/60.  BP Readings from Last 3 Encounters:  06/19/21 124/60  02/09/21 123/68  12/19/20 115/63   She was switched from ACE to ARB due to cough however she has not switched and states cough got better with ABX/prednisone.  She checks her BP in the AM, at 5 pm occ will do 1/4 of atenolol or 1/2 of amlodipine, then she will check it at 8 and it will begin to accerate   She does not workout but stays active around the house. She denies chest pain, shortness of breath, dizziness.   She is not on cholesterol medication and denies myalgias. Her cholesterol is at goal. The cholesterol last visit was:   Lab Results  Component Value Date   CHOL 142 02/09/2021   HDL 40 (L) 02/09/2021   LDLCALC 83 02/09/2021   TRIG 91 02/09/2021   CHOLHDL 3.6 02/09/2021    She has CKD stage III due to HTN. Lab Results  Component Value Date   SPQZRAQT 62 (L) 09/27/2020   She has been working on diet and exercise for prediabetes, and denies paresthesia of the feet, polydipsia, polyuria and visual disturbances. She is on bASA.  Last A1C in the office was:  Lab Results  Component Value Date   HGBA1C 5.8 (H) 02/09/2021   Patient is on Vitamin D supplement. Lab Results  Component Value Date   VD25OH 113 (H) 02/09/2021     She is on thyroid medication. Her medication was not changed  last visit.   Lab Results  Component Value Date   TSH 0.31 (L) 02/09/2021   Patient was held overnight for observation in May 2018 with a (-) Head CT, MRI & carotid dopplers and she was discharged home on LD bASA for suspected possible TIA.  Medication Review Current Outpatient Medications on File Prior to Visit  Medication Sig   acetaminophen (TYLENOL) 325 MG tablet Take 325 mg by mouth every 6 (six) hours as needed.   amLODipine (NORVASC) 10 MG tablet TAKE 1/2 TO 1 (ONE-HALF TO ONE) TABLET BY MOUTH ONCE DAILY FOR BLOOD PRESSURE   Ascorbic Acid (VITAMIN C PO) Take 1 tablet by mouth daily.   aspirin 81 MG tablet Take 81 mg by mouth daily.   atenolol (TENORMIN) 50 MG tablet Take  1 tablet  Daily  for BP                         /       Take 1 tablet by mouth once daily  for blood pressure   benazepril (LOTENSIN) 40 MG tablet Take 1 tablet by mouth once daily for blood pressure   Cholecalciferol (VITAMIN D3) 1000 units CAPS Take 1,000 Units by mouth 2 (two) times daily.   clorazepate (TRANXENE) 7.5 MG tablet TAKE 1/2 TO 1 (ONE-HALF TO ONE) TABLET BY MOUTH 2 TO 3 TIMES DAILY AS NEEDED FOR ANXIETY   Magnesium 500 MG TABS Take 2 tablets by mouth daily.   meclizine (ANTIVERT) 25 MG tablet TAKE 1/2 TO 1 (ONE-HALF TO ONE) TABLET BY MOUTH THREE TIMES DAILY FOR  MOTION  SICKNESS  AND  DIZZINESS   SYNTHROID 150 MCG tablet Take  1 tablet  Daily  on an empty stomach with only water for 30 minutes & no Antacid meds, Calcium or Magnesium for 4 hours & avoid Biotin   vitamin B-12 (CYANOCOBALAMIN) 500 MCG tablet Take 500 mcg by mouth daily.   zinc gluconate 50 MG tablet Take 50 mg by mouth. Takes 1/2 tablet daily.   dexamethasone (DECADRON) 1 MG tablet Take 1 tabs 5 days. 1/2 tab x 6 days Take with food. (Patient not taking: Reported on 02/09/2021)   gabapentin (NEURONTIN) 100 MG capsule Take 1 capsule (100 mg total) by mouth 3 (three) times daily. (Patient not taking: Reported on 02/09/2021)   valACYclovir  (VALTREX) 1000 MG tablet Take 1 tablet (1,000 mg total) by mouth 3 (three) times daily. Take for 7 days (Patient not taking: Reported on 02/09/2021)   No current facility-administered medications on file prior to visit.    Current Problems (verified) Patient Active Problem List   Diagnosis Date Noted   Earlobe pain 08/04/2020   Enrolled in chronic care management 11/14/2018   Normocytic anemia 06/10/2018   CREST syndrome ([place]) 02/12/2018   Abnormal glucose 10/10/2017   FHx: heart disease 10/10/2017   History of TIA (transient ischemic attack) 11/06/2016   Raynaud phenomenon 08/01/2015   Overweight (BMI 25.0-29.9) 03/22/2015   Unifocal PVCs 11/02/2014   CKD (chronic kidney disease) stage 3, GFR 30-59 ml/min ([place]) 03/16/2014   Hyperlipidemia, mixed 10/05/2013   Screening for colorectal cancer 10/05/2013   Vitamin D deficiency 10/05/2013   Prediabetes    Diverticulosis    Essential hypertension 01/14/2012   Hypothyroidism 01/14/2012    Screening Tests Immunization History  Administered Date(s) Administered   DT (Pediatric) 03/22/2015   Influenza Split 03/04/2013   Influenza, High Dose Seasonal PF 03/16/2014, 03/22/2015, 02/15/2016, 04/23/2017, 04/30/2018, 04/08/2019, 06/19/2021   Pneumococcal Polysaccharide-23 03/16/2014   Td 10/10/2017    Preventative care: Last colonoscopy:  2004, will not get another EGD: 2012, Dr. Henrene Pastor Last mammogram: 2011, declines another and declines breast exam at this time.  Last pap smear/pelvic exam: remote DEXA:remote at Dr. Delanna Ahmadi office, willing to get  Prior vaccinations: TD or NFAO:1308 Influenza: 06/19/21 Pneumococcal: 03/2014 Prevnar13:  03/2015 Shingles/Zostavax: declines  Names of Other Physician/Practitioners you currently use: 1. [facility]- here for primary care 2. Dr. Delman Cheadle, eye doctor, 2021 3. , dentist, no- has dentures  Patient Care Team: [name], MD as PCP - General  (Internal Medicine)  Allergies Allergies  Allergen Reactions   Sulfa Antibiotics Other (See Comments)   Asa [Aspirin] Other (See Comments)    Take 81mg  cannot take 325mg  gets nervous   Codeine Nausea Only   Prednisone Nausea And Vomiting    SURGICAL HISTORY She  has a past surgical history that includes Cataract extraction; Vericose vein (1960); [place]; and Eye surgery. FAMILY HISTORY Her  family history includes Colon polyps in her sister; Diabetes in her sister; Heart disease in her brother, father, and mother; Hypertension in her son; Stroke in her brother, mother, and sister. SOCIAL HISTORY She  reports that she quit smoking about 56 years ago. Her smoking use included cigarettes. She has a 15.00 pack-year smoking history. She has never used smokeless tobacco. She reports that she does not drink alcohol and does not use drugs.  MEDICARE WELLNESS OBJECTIVES: Physical activity: yard work and walking Depression/mood screen:   Depression screen [facility] 2/9 06/19/2021  Decreased Interest 1  Down, Depressed, Hopeless 1  PHQ - 2 Score 2  Altered sleeping 0  Tired, decreased energy 1  Change in appetite 0  Feeling bad or failure about yourself  0  Trouble concentrating 0  Moving slowly or fidgety/restless 0  PHQ-9 Score 3  Difficult doing work/chores Not difficult at all   ADLs:  In your present state of health, do you have any difficulty performing the following activities: 06/19/2021 02/12/2021  Hearing? Y N  Vision? N N  Difficulty concentrating or making decisions? N N  Walking or climbing stairs? N N  Dressing or bathing? N N  Doing errands, shopping? N N  Some recent data might be hidden    Cognitive Testing  Alert? Yes  Normal Appearance?Yes  Oriented to person? Yes  Place? Yes   Time? Yes  Recall of three objects?  Yes  Can perform simple calculations? Yes  Displays appropriate judgment?Yes  Can read the correct time from a watch face?Yes  EOL planning: Does  Patient Have a Medical Advance Directive?: No Does patient want to make changes to medical advance directive?: No - Patient declined Would patient like information on creating a medical advance directive?: No - Patient declined No, declined information   Objective:   Blood pressure 124/60, pulse 86, temperature 97.7 F (36.5 C), weight 157 lb 9.6 oz (71.5 kg), SpO2 97 %. Body mass index is 25.44 kg/m.  General Appearance: Well nourished, in no apparent distress. Eyes: PERRLA, EOMs, conjunctiva no swelling or erythema Sinuses: No Frontal/maxillary tenderness ENT/Mouth: Ext aud canals clear, Tms fluid Right ear, no drainage or erythema. No erythema, swelling, or exudate on post pharynx.  Tonsils not swollen or erythematous. Hearing normal.  Neck: Supple, thyroid normal.  Respiratory: Respiratory effort normal, BS equal bilaterally without rales, rhonchi, wheezing or stridor.  Cardio: RRR with no MRGs. Brisk peripheral pulses.2+ pitting edema of LLE, 1+ nonpitting edema RLE Abdomen: Soft, + BS.  Non tender, no guarding, rebound, hernias, masses. Lymphatics: Non tender without lymphadenopathy.  Musculoskeletal: Full ROM, 5/5 strength, Normal gait Skin: Warm, dry . Red irregular border rash under breasts bilaterally  Neuro: Cranial nerves intact. No cerebellar symptoms.  Psych: Awake and oriented X 3, normal affect, Insight and Judgment appropriate.    Medicare Attestation I have personally reviewed: The patient's medical and social history Their use of alcohol, tobacco or illicit drugs Their current medications and supplements The patient's functional ability including ADLs,fall risks, home safety risks, cognitive, and hearing and visual impairment Diet and physical activities Evidence for depression or mood disorders  The patient's weight, height, BMI, and visual acuity have been recorded in the chart.  I have made referrals, counseling, and provided education to the patient based on  review of the above and I have provided the patient with a written personalized care plan for preventive services.       [name] ANP-C  [facility]  06/19/2021

## 2021-06-19 ENCOUNTER — Encounter: Payer: Self-pay | Admitting: Nurse Practitioner

## 2021-06-19 ENCOUNTER — Ambulatory Visit (INDEPENDENT_AMBULATORY_CARE_PROVIDER_SITE_OTHER): Payer: Medicare HMO | Admitting: Nurse Practitioner

## 2021-06-19 ENCOUNTER — Other Ambulatory Visit: Payer: Self-pay

## 2021-06-19 VITALS — BP 124/60 | HR 86 | Temp 97.7°F | Wt 157.6 lb

## 2021-06-19 DIAGNOSIS — E559 Vitamin D deficiency, unspecified: Secondary | ICD-10-CM

## 2021-06-19 DIAGNOSIS — Z0001 Encounter for general adult medical examination with abnormal findings: Secondary | ICD-10-CM

## 2021-06-19 DIAGNOSIS — I10 Essential (primary) hypertension: Secondary | ICD-10-CM

## 2021-06-19 DIAGNOSIS — N950 Postmenopausal bleeding: Secondary | ICD-10-CM

## 2021-06-19 DIAGNOSIS — R7309 Other abnormal glucose: Secondary | ICD-10-CM | POA: Diagnosis not present

## 2021-06-19 DIAGNOSIS — R6889 Other general symptoms and signs: Secondary | ICD-10-CM | POA: Diagnosis not present

## 2021-06-19 DIAGNOSIS — E039 Hypothyroidism, unspecified: Secondary | ICD-10-CM | POA: Diagnosis not present

## 2021-06-19 DIAGNOSIS — Z23 Encounter for immunization: Secondary | ICD-10-CM

## 2021-06-19 DIAGNOSIS — E663 Overweight: Secondary | ICD-10-CM

## 2021-06-19 DIAGNOSIS — Z79899 Other long term (current) drug therapy: Secondary | ICD-10-CM

## 2021-06-19 DIAGNOSIS — N1832 Chronic kidney disease, stage 3b: Secondary | ICD-10-CM | POA: Diagnosis not present

## 2021-06-19 DIAGNOSIS — M7752 Other enthesopathy of left foot: Secondary | ICD-10-CM

## 2021-06-19 DIAGNOSIS — K5790 Diverticulosis of intestine, part unspecified, without perforation or abscess without bleeding: Secondary | ICD-10-CM | POA: Diagnosis not present

## 2021-06-19 DIAGNOSIS — E782 Mixed hyperlipidemia: Secondary | ICD-10-CM | POA: Diagnosis not present

## 2021-06-19 DIAGNOSIS — R600 Localized edema: Secondary | ICD-10-CM

## 2021-06-19 DIAGNOSIS — D649 Anemia, unspecified: Secondary | ICD-10-CM

## 2021-06-19 DIAGNOSIS — E2839 Other primary ovarian failure: Secondary | ICD-10-CM

## 2021-06-19 DIAGNOSIS — B379 Candidiasis, unspecified: Secondary | ICD-10-CM

## 2021-06-19 DIAGNOSIS — I7300 Raynaud's syndrome without gangrene: Secondary | ICD-10-CM

## 2021-06-19 DIAGNOSIS — H6981 Other specified disorders of Eustachian tube, right ear: Secondary | ICD-10-CM

## 2021-06-19 DIAGNOSIS — Z0000 Encounter for general adult medical examination without abnormal findings: Secondary | ICD-10-CM

## 2021-06-19 DIAGNOSIS — H6991 Unspecified Eustachian tube disorder, right ear: Secondary | ICD-10-CM

## 2021-06-19 MED ORDER — CLOTRIMAZOLE 1 % EX CREA: 1.0000 "application " | TOPICAL_CREAM | Freq: Two times a day (BID) | CUTANEOUS | 0 refills | Status: DC

## 2021-06-19 NOTE — Patient Instructions (Signed)
Edema °Edema is an abnormal buildup of fluids in the body tissues and under the skin. Swelling of the legs, feet, and ankles is a common symptom that becomes more likely as you get older. Swelling is also common in looser tissues, like around the eyes. When the affected area is squeezed, the fluid may move out of that spot and leave a dent for a few moments. This dent is called pitting edema. °There are many possible causes of edema. Eating too much salt (sodium) and being on your feet or sitting for a long time can cause edema in your legs, feet, and ankles. Hot weather may make edema worse. Common causes of edema include: °Heart failure. °Liver or kidney disease. °Weak leg blood vessels. °Cancer. °An injury. °Pregnancy. °Medicines. °Being obese. °Low protein levels in the blood. °Edema is usually painless. Your skin may look swollen or shiny. °Follow these instructions at home: °Keep the affected body part raised (elevated) above the level of your heart when you are sitting or lying down. °Do not sit still or stand for long periods of time. °Do not wear tight clothing. Do not wear garters on your upper legs. °Exercise your legs to get your circulation going. This helps to move the fluid back into your blood vessels, and it may help the swelling go down. °Wear elastic bandages or support stockings to reduce swelling as told by your health care provider. °Eat a low-salt (low-sodium) diet to reduce fluid as told by your health care provider. °Depending on the cause of your swelling, you may need to limit how much fluid you drink (fluid restriction). °Take over-the-counter and prescription medicines only as told by your health care provider. °Contact a health care provider if: °Your edema does not get better with treatment. °You have heart, liver, or kidney disease and have symptoms of edema. °You have sudden and unexplained weight gain. °Get help right away if: °You develop shortness of breath or chest pain. °You  cannot breathe when you lie down. °You develop pain, redness, or warmth in the swollen areas. °You have heart, liver, or kidney disease and suddenly get edema. °You have a fever and your symptoms suddenly get worse. °Summary °Edema is an abnormal buildup of fluids in the body tissues and under the skin. °Eating too much salt (sodium) and being on your feet or sitting for a long time can cause edema in your legs, feet, and ankles. °Keep the affected body part raised (elevated) above the level of your heart when you are sitting or lying down. °This information is not intended to replace advice given to you by your health care provider. Make sure you discuss any questions you have with your health care provider. °Document Revised: 10/27/2020 Document Reviewed: 03/22/2020 °Elsevier Patient Education © 2022 [employer] Inc. ° °

## 2021-06-20 LAB — CBC WITH DIFFERENTIAL/PLATELET
Absolute Monocytes: 819 cells/uL (ref 200–950)
Basophils Absolute: 36 cells/uL (ref 0–200)
Basophils Relative: 0.4 %
Eosinophils Absolute: 73 cells/uL (ref 15–500)
Eosinophils Relative: 0.8 %
HCT: 31.6 % — ABNORMAL LOW (ref 35.0–45.0)
Hemoglobin: 10.5 g/dL — ABNORMAL LOW (ref 11.7–15.5)
Lymphs Abs: 1838 cells/uL (ref 850–3900)
MCH: 28.2 pg (ref 27.0–33.0)
MCHC: 33.2 g/dL (ref 32.0–36.0)
MCV: 84.7 fL (ref 80.0–100.0)
MPV: 11.4 fL (ref 7.5–12.5)
Monocytes Relative: 9 %
Neutro Abs: 6334 cells/uL (ref 1500–7800)
Neutrophils Relative %: 69.6 %
Platelets: 312 10*3/uL (ref 140–400)
RBC: 3.73 10*6/uL — ABNORMAL LOW (ref 3.80–5.10)
RDW: 13.2 % (ref 11.0–15.0)
Total Lymphocyte: 20.2 %
WBC: 9.1 10*3/uL (ref 3.8–10.8)

## 2021-06-20 LAB — COMPLETE METABOLIC PANEL WITH GFR
AG Ratio: 1.5 (calc) (ref 1.0–2.5)
ALT: 13 U/L (ref 6–29)
AST: 18 U/L (ref 10–35)
Albumin: 4 g/dL (ref 3.6–5.1)
Alkaline phosphatase (APISO): 77 U/L (ref 37–153)
BUN/Creatinine Ratio: 17 (calc) (ref 6–22)
BUN: 17 mg/dL (ref 7–25)
CO2: 24 mmol/L (ref 20–32)
Calcium: 9.2 mg/dL (ref 8.6–10.4)
Chloride: 99 mmol/L (ref 98–110)
Creat: 0.99 mg/dL — ABNORMAL HIGH (ref 0.60–0.95)
Globulin: 2.7 g/dL (calc) (ref 1.9–3.7)
Glucose, Bld: 97 mg/dL (ref 65–99)
Potassium: 4.4 mmol/L (ref 3.5–5.3)
Sodium: 135 mmol/L (ref 135–146)
Total Bilirubin: 0.4 mg/dL (ref 0.2–1.2)
Total Protein: 6.7 g/dL (ref 6.1–8.1)
eGFR: 55 mL/min/{1.73_m2} — ABNORMAL LOW (ref >=60)

## 2021-06-20 LAB — IRON, TOTAL/TOTAL IRON BINDING CAP
%SAT: 20 % (calc) (ref 16–45)
Iron: 54 ug/dL (ref 45–160)
TIBC: 275 mcg/dL (calc) (ref 250–450)

## 2021-06-20 LAB — FERRITIN: Ferritin: 61 ng/mL (ref 16–288)

## 2021-06-20 LAB — VITAMIN D 25 HYDROXY (VIT D DEFICIENCY, FRACTURES): Vit D, 25-Hydroxy: 113 ng/mL — ABNORMAL HIGH (ref 30–100)

## 2021-06-20 LAB — TSH: TSH: 0.83 mIU/L (ref 0.40–4.50)

## 2021-06-20 NOTE — Progress Notes (Signed)
If she is having burning on urination or frequency can return to leave a urine sample.  Decrease Vit D to 2000 units 2 cap daily.  Mikayla Harrison is working on [employer] referral for bloody vaginal discharge

## 2021-06-26 ENCOUNTER — Other Ambulatory Visit: Payer: Self-pay

## 2021-06-26 ENCOUNTER — Encounter: Payer: Self-pay | Admitting: Obstetrics and Gynecology

## 2021-06-26 ENCOUNTER — Ambulatory Visit (INDEPENDENT_AMBULATORY_CARE_PROVIDER_SITE_OTHER): Payer: Medicare HMO | Admitting: Obstetrics and Gynecology

## 2021-06-26 ENCOUNTER — Other Ambulatory Visit (HOSPITAL_COMMUNITY)
Admission: RE | Admit: 2021-06-26 | Discharge: 2021-06-26 | Disposition: A | Discharge status: Home / Self Care | Payer: Medicare HMO | Source: Ambulatory Visit | Attending: Obstetrics and Gynecology | Admitting: Obstetrics and Gynecology

## 2021-06-26 VITALS — BP 146/68 | HR 60 | Resp 16 | Ht 62.0 in | Wt 154.0 lb

## 2021-06-26 DIAGNOSIS — N888 Other specified noninflammatory disorders of cervix uteri: Secondary | ICD-10-CM | POA: Insufficient documentation

## 2021-06-26 DIAGNOSIS — Z124 Encounter for screening for malignant neoplasm of cervix: Secondary | ICD-10-CM

## 2021-06-26 DIAGNOSIS — N898 Other specified noninflammatory disorders of vagina: Secondary | ICD-10-CM

## 2021-06-26 DIAGNOSIS — Z780 Asymptomatic menopausal state: Secondary | ICD-10-CM | POA: Diagnosis not present

## 2021-06-26 DIAGNOSIS — Z1151 Encounter for screening for human papillomavirus (HPV): Secondary | ICD-10-CM | POA: Insufficient documentation

## 2021-06-26 DIAGNOSIS — Z113 Encounter for screening for infections with a predominantly sexual mode of transmission: Secondary | ICD-10-CM | POA: Insufficient documentation

## 2021-06-26 DIAGNOSIS — R87620 Atypical squamous cells of undetermined significance on cytologic smear of vagina (ASC-US): Secondary | ICD-10-CM | POA: Diagnosis not present

## 2021-06-26 DIAGNOSIS — N950 Postmenopausal bleeding: Secondary | ICD-10-CM

## 2021-06-26 NOTE — Progress Notes (Signed)
GYNECOLOGY  VISIT  REF: Mikayla Lamas, NP   HPI: 88 y.o.   Widowed  Caucasian  female   G5 P5. with No LMP recorded. Patient is postmenopausal.   here for postmenopausal bleeding.    Patient states vaginal bleeding began in June and has had vaginal discharge since. She states she had a gush of mucous and this was followed by discharge and blood, which continues.   Feels sick to her stomach for a couple of days.  No vomiting.  Has both constipation or diarrhea.  No fevers.   No hx GYN problems.   Takes a baby aspirin daily.   Not sexually active for many years.  Patient is a widow.   GYNECOLOGIC HISTORY: No LMP recorded. Patient is postmenopausal. Contraception:  PMP Menopausal hormone therapy:  none Last mammogram:  01-25-10 Neg/Birads1 Last pap smear:   years ago--always normal        OB History     Gravida  5   Para  5   Term      Preterm      AB      Living  5      SAB      IAB      Ectopic      Multiple      Live Births                 Patient Active Problem List   Diagnosis Date Noted   Earlobe pain 08/04/2020   Enrolled in chronic care management 11/14/2018   Normocytic anemia 06/10/2018   CREST syndrome ([place]) 02/12/2018   Abnormal glucose 10/10/2017   FHx: heart disease 10/10/2017   History of TIA (transient ischemic attack) 11/06/2016   Raynaud phenomenon 08/01/2015   Overweight (BMI 25.0-29.9) 03/22/2015   Unifocal PVCs 11/02/2014   CKD (chronic kidney disease) stage 3, GFR 30-59 ml/min ([facility]) 03/16/2014   Hyperlipidemia, mixed 10/05/2013   Screening for colorectal cancer 10/05/2013   Vitamin D deficiency 10/05/2013   Prediabetes    Diverticulosis    Essential hypertension 01/14/2012   Hypothyroidism 01/14/2012    Past Medical History:  Diagnosis Date   CKD (chronic kidney disease) stage 3, GFR 30-59 ml/min ([facility])    Colon polyp    Diverticulosis    Hypertension    Iron deficiency    Prediabetes    Prediabetes    Small  bowel obstruction ([facility])     Past Surgical History:  Procedure Laterality Date   CATARACT EXTRACTION     EYE SURGERY     MACULAR TEAR     Vericose vein  1960    Current Outpatient Medications  Medication Sig Dispense Refill   acetaminophen (TYLENOL) 325 MG tablet Take 325 mg by mouth every 6 (six) hours as needed.     amLODipine (NORVASC) 10 MG tablet TAKE 1/2 TO 1 (ONE-HALF TO ONE) TABLET BY MOUTH ONCE DAILY FOR BLOOD PRESSURE 90 tablet 1   Ascorbic Acid (VITAMIN C PO) Take 1 tablet by mouth daily.     aspirin 81 MG tablet Take 81 mg by mouth daily.     atenolol (TENORMIN) 50 MG tablet Take  1 tablet  Daily  for BP                         /       Take 1 tablet by mouth once daily for blood pressure 90 tablet 3   benazepril (LOTENSIN) 40  MG tablet Take 1 tablet by mouth once daily for blood pressure 90 tablet 0   Cholecalciferol (VITAMIN D3) 1000 units CAPS Take 1,000 Units by mouth 2 (two) times daily.     clorazepate (TRANXENE) 7.5 MG tablet TAKE 1/2 TO 1 (ONE-HALF TO ONE) TABLET BY MOUTH 2 TO 3 TIMES DAILY AS NEEDED FOR ANXIETY 90 tablet 0   clotrimazole (LOTRIMIN) 1 % cream Apply 1 application topically 2 (two) times daily. 30 g 0   Magnesium 500 MG TABS Take 2 tablets by mouth daily.     meclizine (ANTIVERT) 25 MG tablet TAKE 1/2 TO 1 (ONE-HALF TO ONE) TABLET BY MOUTH THREE TIMES DAILY FOR  MOTION  SICKNESS  AND  DIZZINESS 30 tablet 0   SYNTHROID 150 MCG tablet Take  1 tablet  Daily  on an empty stomach with only water for 30 minutes & no Antacid meds, Calcium or Magnesium for 4 hours & avoid Biotin 90 tablet 3   vitamin B-12 (CYANOCOBALAMIN) 500 MCG tablet Take 500 mcg by mouth daily.     zinc gluconate 50 MG tablet Take 50 mg by mouth. Takes 1/2 tablet daily.     No current facility-administered medications for this visit.     ALLERGIES: Sulfa antibiotics, Asa [aspirin], Codeine, and Prednisone  Family History  Problem Relation Age of Onset   Heart disease Mother    Stroke  Mother    Heart disease Father    Colon polyps Sister    Diabetes Sister    Stroke Sister    Heart disease Brother    Stroke Brother    Hypertension Son     Social History   Socioeconomic History   Marital status: Widowed    Spouse name: Not on file   Number of children: Not on file   Years of education: Not on file   Highest education level: Not on file  Occupational History   Not on file  Tobacco Use   Smoking status: Former    Packs/day: 1.00    Years: 15.00    Pack years: 15.00    Types: Cigarettes    Quit date: 06/11/1965    Years since quitting: 56.0   Smokeless tobacco: Never  Vaping Use   Vaping Use: Never used  Substance and Sexual Activity   Alcohol use: No   Drug use: No   Sexual activity: Not Currently  Other Topics Concern   Not on file  Social History Narrative   Not on file   Social Determinants of Health   Financial Resource Strain: Not on file  Food Insecurity: Not on file  Transportation Needs: Not on file  Physical Activity: Not on file  Stress: Not on file  Social Connections: Not on file  Intimate Partner Violence: Not on file    Review of Systems  See HPI.   PHYSICAL EXAMINATION:    BP (!) 146/68    Pulse 60    Resp 16    Ht 5\' 2"  (1.575 m)    Wt 154 lb (69.9 kg)    BMI 28.17 kg/m     General appearance: alert, cooperative and appears stated age Head: Normocephalic, without obvious abnormality, atraumatic Neck: no adenopathy, supple, symmetrical, trachea midline and thyroid normal to inspection and palpation Lungs: clear to auscultation bilaterally Heart: regular rate and rhythm Abdomen: soft, non-tender, protruberant lower abdomen. Extremities: extremities normal, atraumatic, no cyanosis or edema Skin: Skin color, texture, turgor normal. No rashes or lesions Lymph nodes: Cervical, supraclavicular,  and axillary nodes normal. No abnormal inguinal nodes palpated Neurologic: Grossly normal  Pelvic: External genitalia:  no lesions               Urethra:  normal appearing urethra with no masses, tenderness or lesions              Bartholins and Skenes: normal                 Vagina: normal appearing vagina with normal color and discharge, no lesions              Cervix: large nabothian cyst on cervix.                 Bimanual Exam:  Uterus:  normal size, contour, position, consistency, mobility, non-tender.                Adnexa:  pelvic mass?              Rectal exam: Yes.  .  Confirms.              Anus:  normal sphincter tone, no lesions  Chaperone was present for exam:  Estill Bamberg, CMA  ASSESSMENT  Postmenopausal bleeding.  Large nabothian cyst.  Vaginal discharge. Possible pelvic mass.  Stage 3 chronic kidney disease.   PLAN  We discussed postmenopausal bleeding and possible etiologies:  atrophy, polyps, cysts, and malignancy.  We discussed her nabothian cyst also.  Pap and HR HPV testing.  Vaginitis testing.  Return for pelvic ultrasound and possible endometrial biopsy.   Procedures and rationale explained.  Questions invited and answered.  An After Visit Summary was printed and given to the patient.

## 2021-06-26 NOTE — Patient Instructions (Signed)
Postmenopausal Bleeding Postmenopausal bleeding is any bleeding that occurs after menopause. Menopause is a time in a woman's life when monthly periods stop. Any type of bleeding after menopause should be checked by your doctor. Treatment will depend on the cause. This kind of bleeding can be caused by: Taking hormones during menopause. Low or high amounts of female hormones in the body. This can cause the lining of the womb (uterus) to become too thin or too thick. Cancer. Growths in the womb that are not cancer. Follow these instructions at home:  Watch for any changes in your symptoms. Let your doctor know about them. Avoid using tampons and douches as told by your doctor. Change your pads regularly. Get regular pelvic exams. This includes Pap tests. Take iron pills as told by your doctor. Take over-the-counter and prescription medicines only as told by your doctor. Keep all follow-up visits. Contact a doctor if: You have new bleeding from the vagina after menopause. You have pain in your belly (abdomen). Get help right away if: You have a fever or chills. You have very bad pain with bleeding. You have clumps of blood (blood clots) coming from your vagina. You have a lot of bleeding, and: You use more than 1 pad an hour. This kind of bleeding has never happened before. You have headaches. You feel dizzy or you feel like you are going to pass out (faint). Summary Any type of bleeding after menopause should be checked by your doctor. Avoid using tampons or douches. Get regular pelvic exams. This includes Pap tests. Contact a doctor if you have new bleeding or pain in your belly. Watch for any changes in your symptoms. Let your doctor know about them. This information is not intended to replace advice given to you by your health care provider. Make sure you discuss any questions you have with your health care provider. Document Revised: 11/12/2019 Document Reviewed:  11/12/2019 Elsevier Patient Education  [place].

## 2021-06-27 LAB — CERVICOVAGINAL ANCILLARY ONLY
Bacterial Vaginitis (gardnerella): NEGATIVE
Candida Glabrata: NEGATIVE
Candida Vaginitis: NEGATIVE
Comment: NEGATIVE
Comment: NEGATIVE
Comment: NEGATIVE
Comment: NEGATIVE
Trichomonas: NEGATIVE

## 2021-06-28 LAB — CYTOLOGY - PAP
Comment: NEGATIVE
Diagnosis: UNDETERMINED — AB
High risk HPV: NEGATIVE

## 2021-07-04 ENCOUNTER — Other Ambulatory Visit: Payer: Medicare HMO | Admitting: Obstetrics and Gynecology

## 2021-07-04 ENCOUNTER — Other Ambulatory Visit: Payer: Medicare HMO

## 2021-07-11 ENCOUNTER — Other Ambulatory Visit: Payer: Self-pay | Admitting: Nurse Practitioner

## 2021-07-11 DIAGNOSIS — I10 Essential (primary) hypertension: Secondary | ICD-10-CM

## 2021-07-20 ENCOUNTER — Ambulatory Visit: Payer: Medicare HMO | Admitting: Obstetrics and Gynecology

## 2021-07-20 ENCOUNTER — Other Ambulatory Visit: Payer: Medicare HMO | Admitting: Obstetrics and Gynecology

## 2021-07-20 ENCOUNTER — Ambulatory Visit (INDEPENDENT_AMBULATORY_CARE_PROVIDER_SITE_OTHER): Payer: Medicare HMO

## 2021-07-20 ENCOUNTER — Encounter: Payer: Self-pay | Admitting: Obstetrics and Gynecology

## 2021-07-20 ENCOUNTER — Other Ambulatory Visit: Payer: Self-pay

## 2021-07-20 ENCOUNTER — Other Ambulatory Visit: Payer: Medicare HMO

## 2021-07-20 VITALS — BP 106/58 | HR 71 | Ht 62.0 in | Wt 154.0 lb

## 2021-07-20 DIAGNOSIS — N950 Postmenopausal bleeding: Secondary | ICD-10-CM

## 2021-07-20 DIAGNOSIS — R9389 Abnormal findings on diagnostic imaging of other specified body structures: Secondary | ICD-10-CM | POA: Diagnosis not present

## 2021-07-20 NOTE — Progress Notes (Signed)
GYNECOLOGY  VISIT   HPI: 88 y.o.   Widowed  Caucasian  female   G5P5 with No LMP recorded. Patient is postmenopausal.   here for pelvic ultrasound.   Her postmenopausal bleeding has stopped.   GYNECOLOGIC HISTORY: No LMP recorded. Patient is postmenopausal. Contraception:  PMP Menopausal hormone therapy:  none Last mammogram:   01-25-10 Neg/Birads1 Last pap smear:   06-26-21 ASCUS:Neg HR HPV        OB History     Gravida  5   Para  5   Term      Preterm      AB      Living  5      SAB      IAB      Ectopic      Multiple      Live Births                 Patient Active Problem List   Diagnosis Date Noted   Earlobe pain 08/04/2020   Enrolled in chronic care management 11/14/2018   Normocytic anemia 06/10/2018   CREST syndrome ([place]) 02/12/2018   Abnormal glucose 10/10/2017   FHx: heart disease 10/10/2017   History of TIA (transient ischemic attack) 11/06/2016   Raynaud phenomenon 08/01/2015   Overweight (BMI 25.0-29.9) 03/22/2015   Unifocal PVCs 11/02/2014   CKD (chronic kidney disease) stage 3, GFR 30-59 ml/min (HCC) 03/16/2014   Hyperlipidemia, mixed 10/05/2013   Screening for colorectal cancer 10/05/2013   Vitamin D deficiency 10/05/2013   Prediabetes    Diverticulosis    Essential hypertension 01/14/2012   Hypothyroidism 01/14/2012    Past Medical History:  Diagnosis Date   CKD (chronic kidney disease) stage 3, GFR 30-59 ml/min (HCC)    Colon polyp    Diverticulosis    Hypertension    Iron deficiency    Prediabetes    Prediabetes    Small bowel obstruction (HCC)     Past Surgical History:  Procedure Laterality Date   CATARACT EXTRACTION     EYE SURGERY     MACULAR TEAR     Vericose vein  1960    Current Outpatient Medications  Medication Sig Dispense Refill   acetaminophen (TYLENOL) 325 MG tablet Take 325 mg by mouth every 6 (six) hours as needed.     amLODipine (NORVASC) 10 MG tablet TAKE 1/2 TO 1 (ONE-HALF TO ONE) TABLET BY  MOUTH ONCE DAILY FOR BLOOD PRESSURE 90 tablet 1   Ascorbic Acid (VITAMIN C PO) Take 1 tablet by mouth daily.     aspirin 81 MG tablet Take 81 mg by mouth daily.     atenolol (TENORMIN) 50 MG tablet Take  1 tablet  Daily  for BP                         /       Take 1 tablet by mouth once daily for blood pressure 90 tablet 3   benazepril (LOTENSIN) 40 MG tablet Take 1 tablet by mouth once daily for blood pressure 90 tablet 0   BINAXNOW COVID-19 AG HOME TEST KIT Use as Directed on the Package     Cholecalciferol (VITAMIN D3) 1000 units CAPS Take 1,000 Units by mouth 2 (two) times daily.     clorazepate (TRANXENE) 7.5 MG tablet TAKE 1/2 TO 1 (ONE-HALF TO ONE) TABLET BY MOUTH 2 TO 3 TIMES DAILY AS NEEDED FOR ANXIETY 90 tablet 0  clotrimazole (LOTRIMIN) 1 % cream Apply 1 application topically 2 (two) times daily. 30 g 0   Magnesium 500 MG TABS Take 2 tablets by mouth daily.     meclizine (ANTIVERT) 25 MG tablet TAKE 1/2 TO 1 (ONE-HALF TO ONE) TABLET BY MOUTH THREE TIMES DAILY FOR  MOTION  SICKNESS  AND  DIZZINESS 30 tablet 0   SYNTHROID 150 MCG tablet Take  1 tablet  Daily  on an empty stomach with only water for 30 minutes & no Antacid meds, Calcium or Magnesium for 4 hours & avoid Biotin 90 tablet 3   vitamin B-12 (CYANOCOBALAMIN) 500 MCG tablet Take 500 mcg by mouth daily.     zinc gluconate 50 MG tablet Take 50 mg by mouth. Takes 1/2 tablet daily.     No current facility-administered medications for this visit.     ALLERGIES: Other, Sulfa antibiotics, Asa [aspirin], Codeine, and Prednisone  Family History  Problem Relation Age of Onset   Heart disease Mother    Stroke Mother    Heart disease Father    Colon polyps Sister    Diabetes Sister    Stroke Sister    Heart disease Brother    Stroke Brother    Hypertension Son     Social History   Socioeconomic History   Marital status: Widowed    Spouse name: Not on file   Number of children: Not on file   Years of education: Not on  file   Highest education level: Not on file  Occupational History   Not on file  Tobacco Use   Smoking status: Former    Packs/day: 1.00    Years: 15.00    Pack years: 15.00    Types: Cigarettes    Quit date: 06/11/1965    Years since quitting: 56.1   Smokeless tobacco: Never  Vaping Use   Vaping Use: Never used  Substance and Sexual Activity   Alcohol use: No   Drug use: No   Sexual activity: Not Currently  Other Topics Concern   Not on file  Social History Narrative   Not on file   Social Determinants of Health   Financial Resource Strain: Not on file  Food Insecurity: Not on file  Transportation Needs: Not on file  Physical Activity: Not on file  Stress: Not on file  Social Connections: Not on file  Intimate Partner Violence: Not on file    Review of Systems  All other systems reviewed and are negative.  PHYSICAL EXAMINATION:    BP (!) 106/58    Pulse 71    Ht 5' 2" (1.575 m)    Wt 154 lb (69.9 kg)    SpO2 98%    BMI 28.17 kg/m     General appearance: alert, cooperative and appears stated age   Pelvic US  Uterus 7.32 x 4.91 x 3.60 cm.  EMS 29.05 mm. Heterogeneous endometrium with solid and cystic components, slightly vascular, measuring 3.2 x 3.9 cm.  Right ovary atrophic.  Left ovary not seen.  No adnexal masses.  No free fluid.  ASSESSMENT  Postmenopausal bleeding.  Endometrial thickening.   PLAN  Ultrasound findings and images reviewed.  Endometrial biopsy in office recommended.  Procedure, risks and benefits, and rational explained.  Risks may include but are not limited to bleeding, infection, uterine perforation, and additional procedures and care following the biopsy.  Will plan for paracervical block, Tylenol 500 mg prior to procedure, and have a support person with her  to drive her home.      An After Visit Summary was printed and given to the patient.  30 min  total time was spent for this patient encounter, including preparation,  face-to-face counseling with the patient, coordination of care, and documentation of the encounter.

## 2021-07-20 NOTE — Patient Instructions (Signed)
Endometrial Biopsy ?An endometrial biopsy is a procedure to remove tissue samples from the endometrium, which is the lining of the uterus. The tissue that is removed can then be checked under a microscope for disease. ?This procedure is used to diagnose conditions such as endometrial cancer, endometrial tuberculosis, polyps, or other inflammatory conditions. This procedure may also be used to investigate uterine bleeding to determine where you are in your menstrual cycle or how your hormone levels are affecting the lining of the uterus. ?Tell a health care provider about: ?Any allergies you have. ?All medicines you are taking, including vitamins, herbs, eye drops, creams, and over-the-counter medicines. ?Any problems you or family members have had with anesthetic medicines. ?Any blood disorders you have. ?Any surgeries you have had. ?Any medical conditions you have. ?Whether you are pregnant or may be pregnant. ?What are the risks? ?Generally, this is a safe procedure. However, problems may occur, including: ?Bleeding. ?Pelvic infection. ?Puncture of the wall of the uterus with the biopsy device (rare). ?Allergic reactions to medicines. ?What happens before the procedure? ?Keep a record of your menstrual cycles as told by your health care provider. You may need to schedule your procedure for a specific time in your cycle. ?You may want to bring a sanitary pad to wear after the procedure. ?Plan to have someone take you home from the hospital or clinic. ?Ask your health care provider about: ?Changing or stopping your regular medicines. This is especially important if you are taking diabetes medicines, arthritis medicines, or blood thinners. ?Taking medicines such as aspirin and ibuprofen. These medicines can thin your blood. Do not take these medicines unless your health care provider tells you to take them. ?Taking over-the-counter medicines, vitamins, herbs, and supplements. ?What happens during the procedure? ?You  will lie on an exam table with your feet and legs supported as in a pelvic exam. ?Your health care provider will insert an instrument (speculum) into your vagina to see your cervix. ?Your cervix will be cleansed with an antiseptic solution. ?A medicine (local anesthetic) will be used to numb the cervix. ?A forceps instrument (tenaculum) will be used to hold your cervix steady for the biopsy. ?A thin, rod-like instrument (uterine sound) will be inserted through your cervix to determine the length of your uterus and the location where the biopsy sample will be removed. ?A thin, flexible tube (catheter) will be inserted through your cervix and into the uterus. The catheter will be used to collect the biopsy sample from your endometrial tissue. ?The catheter and speculum will then be removed, and the tissue sample will be sent to a lab for examination. ?The procedure may vary among health care providers and hospitals. ?What can I expect after procedure? ?You will rest in a recovery area until you are ready to go home. ?You may have mild cramping and a small amount of vaginal bleeding. This is normal. ?You may have a small amount of vaginal bleeding for a few days. This is normal. ?It is up to you to get the results of your procedure. Ask your health care provider, or the department that is doing the procedure, when your results will be ready. ?Follow these instructions at home: ?Take over-the-counter and prescription medicines only as told by your health care provider. ?Do not douche, use tampons, or have sexual intercourse until your health care provider approves. ?Return to your normal activities as told by your health care provider. Ask your health care provider what activities are safe for you. ?Follow instructions   from your health care provider about any activity restrictions, such as restrictions on strenuous exercise or heavy lifting. ?Keep all follow-up visits. This is important. ?Contact a health care  provider: ?You have heavy bleeding, or bleed for longer than 2 days after the procedure. ?You have bad smelling discharge from your vagina. ?You have a fever or chills. ?You have a burning sensation when urinating or you have difficulty urinating. ?You have severe pain in your lower abdomen. ?Get help right away if you: ?You have severe cramps in your stomach or back. ?You pass large blood clots. ?Your bleeding increases. ?You become weak or light-headed, or you faint or lose consciousness. ?Summary ?An endometrial biopsy is a procedure to remove tissue samples is taken from the endometrium, which is the lining of the uterus. ?The tissue sample that is removed will be checked under a microscope for disease. ?This procedure is used to diagnose conditions such as endometrial cancer, endometrial tuberculosis, polyps, or other inflammatory conditions. ?After the procedure, it is common to have mild cramping and a small amount of vaginal bleeding for a few days. ?Do not douche, use tampons, or have sexual intercourse until your health care provider approves. Ask your health care provider which activities are safe for you. ?This information is not intended to replace advice given to you by your health care provider. Make sure you discuss any questions you have with your health care provider. ?Document Revised: 02/08/2021 Document Reviewed: 12/21/2019 ?Elsevier Patient Education ? 2022 Elsevier Inc. ? ?

## 2021-07-21 ENCOUNTER — Other Ambulatory Visit: Payer: Self-pay | Admitting: *Deleted

## 2021-07-21 DIAGNOSIS — N950 Postmenopausal bleeding: Secondary | ICD-10-CM

## 2021-08-01 ENCOUNTER — Other Ambulatory Visit: Payer: Medicare HMO

## 2021-08-01 ENCOUNTER — Other Ambulatory Visit: Payer: Medicare HMO | Admitting: Obstetrics and Gynecology

## 2021-08-02 ENCOUNTER — Other Ambulatory Visit: Payer: Self-pay | Admitting: Internal Medicine

## 2021-08-16 NOTE — Progress Notes (Signed)
GYNECOLOGY  VISIT ?  ?HPI: ?88 y.o.   Widowed  Caucasian  female   ?G5P5 with No LMP recorded. Patient is postmenopausal.   ?here for endometrial biopsy for postmenopausal bleeding.  ?States she had to reschedule this appointment twice due to family.  ? ?Pelvic US showed her endometrium with solid and cystic components, slightly vascular measuring 3.2 x 3/9 cm.  ?Right ovary atrophic.  ?Left ovary not seen,.  ?No free fluid. ? ?GYNECOLOGIC HISTORY: ?No LMP recorded. Patient is postmenopausal. ?Contraception:  PMP ?Menopausal hormone therapy:  none ?Last mammogram:   01-25-10 Neg/Birads1 ?Last pap smear:    06-26-21 ASCUS:Neg HR HPV ?       ?OB History   ? ? Gravida  ?5  ? Para  ?5  ? Term  ?   ? Preterm  ?   ? AB  ?   ? Living  ?5  ?  ? ? SAB  ?   ? IAB  ?   ? Ectopic  ?   ? Multiple  ?   ? Live Births  ?   ?   ?  ?  ?    ? ?Patient Active Problem List  ? Diagnosis Date Noted  ? Earlobe pain 08/04/2020  ? Enrolled in chronic care management 11/14/2018  ? Normocytic anemia 06/10/2018  ? CREST syndrome ([place]) 02/12/2018  ? Abnormal glucose 10/10/2017  ? FHx: heart disease 10/10/2017  ? History of TIA (transient ischemic attack) 11/06/2016  ? Raynaud phenomenon 08/01/2015  ? Overweight (BMI 25.0-29.9) 03/22/2015  ? Unifocal PVCs 11/02/2014  ? CKD (chronic kidney disease) stage 3, GFR 30-59 ml/min (HCC) 03/16/2014  ? Hyperlipidemia, mixed 10/05/2013  ? Screening for colorectal cancer 10/05/2013  ? Vitamin D deficiency 10/05/2013  ? Prediabetes   ? Diverticulosis   ? Essential hypertension 01/14/2012  ? Hypothyroidism 01/14/2012  ? ? ?Past Medical History:  ?Diagnosis Date  ? CKD (chronic kidney disease) stage 3, GFR 30-59 ml/min (HCC)   ? Colon polyp   ? Diverticulosis   ? Hypertension   ? Iron deficiency   ? Prediabetes   ? Prediabetes   ? Small bowel obstruction ([place])   ? ? ?Past Surgical History:  ?Procedure Laterality Date  ? CATARACT EXTRACTION    ? EYE SURGERY    ? MACULAR TEAR    ? Vericose vein  1960  ? ? ?Current  Outpatient Medications  ?Medication Sig Dispense Refill  ? acetaminophen (TYLENOL) 325 MG tablet Take 325 mg by mouth every 6 (six) hours as needed.    ? amLODipine (NORVASC) 10 MG tablet TAKE 1/2 TO 1 (ONE-HALF TO ONE) TABLET BY MOUTH ONCE DAILY FOR BLOOD PRESSURE 90 tablet 1  ? Ascorbic Acid (VITAMIN C PO) Take 1 tablet by mouth daily.    ? aspirin 81 MG tablet Take 81 mg by mouth daily.    ? atenolol (TENORMIN) 50 MG tablet Take  1 tablet  Daily  for BP                         /       Take 1 tablet by mouth once daily for blood pressure 90 tablet 3  ? benazepril (LOTENSIN) 40 MG tablet Take 1 tablet by mouth once daily for blood pressure 90 tablet 0  ? BINAXNOW COVID-19 AG HOME TEST KIT Use as Directed on the Package    ? Cholecalciferol (VITAMIN D3) 1000 units CAPS Take 1,000  Units by mouth 2 (two) times daily.    ? clorazepate (TRANXENE) 7.5 MG tablet Take 1/2 - 1 tablet 2 - 3 x /day ONLY if needed for Anxiety Attack &  limit to 5 days /week to avoid Addiction & Dementia 30 tablet 0  ? clotrimazole (LOTRIMIN) 1 % cream Apply 1 application topically 2 (two) times daily. 30 g 0  ? Magnesium 500 MG TABS Take 2 tablets by mouth daily.    ? meclizine (ANTIVERT) 25 MG tablet TAKE 1/2 TO 1 (ONE-HALF TO ONE) TABLET BY MOUTH THREE TIMES DAILY FOR  MOTION  SICKNESS  AND  DIZZINESS 30 tablet 0  ? SYNTHROID 150 MCG tablet Take  1 tablet  Daily  on an empty stomach with only water for 30 minutes & no Antacid meds, Calcium or Magnesium for 4 hours & avoid Biotin 90 tablet 3  ? vitamin B-12 (CYANOCOBALAMIN) 500 MCG tablet Take 500 mcg by mouth daily.    ? zinc gluconate 50 MG tablet Take 50 mg by mouth. Takes 1/2 tablet daily.    ? ?No current facility-administered medications for this visit.  ?  ? ?ALLERGIES: Other, Sulfa antibiotics, Asa [aspirin], Codeine, and Prednisone ? ?Family History  ?Problem Relation Age of Onset  ? Heart disease Mother   ? Stroke Mother   ? Heart disease Father   ? Colon polyps Sister   ? Diabetes  Sister   ? Stroke Sister   ? Heart disease Brother   ? Stroke Brother   ? Hypertension Son   ? ? ?Social History  ? ?Socioeconomic History  ? Marital status: Widowed  ?  Spouse name: Not on file  ? Number of children: Not on file  ? Years of education: Not on file  ? Highest education level: Not on file  ?Occupational History  ? Not on file  ?Tobacco Use  ? Smoking status: Former  ?  Packs/day: 1.00  ?  Years: 15.00  ?  Pack years: 15.00  ?  Types: Cigarettes  ?  Quit date: 06/11/1965  ?  Years since quitting: 56.2  ? Smokeless tobacco: Never  ?Vaping Use  ? Vaping Use: Never used  ?Substance and Sexual Activity  ? Alcohol use: No  ? Drug use: No  ? Sexual activity: Not Currently  ?Other Topics Concern  ? Not on file  ?Social History Narrative  ? Not on file  ? ?Social Determinants of Health  ? ?Financial Resource Strain: Not on file  ?Food Insecurity: Not on file  ?Transportation Needs: Not on file  ?Physical Activity: Not on file  ?Stress: Not on file  ?Social Connections: Not on file  ?Intimate Partner Violence: Not on file  ? ? ?Review of Systems  ?All other systems reviewed and are negative. ? ?PHYSICAL EXAMINATION:   ? ?BP 122/62   Pulse 64   Resp 16   Ht $R'5\' 2"'GW$  (1.575 m)   Wt 154 lb (69.9 kg)   BMI 28.17 kg/m?     ?General appearance: alert, cooperative and appears stated age ?  ?Pelvic: External genitalia:  no lesions ?             Urethra:  normal appearing urethra with no masses, tenderness or lesions ?             Bartholins and Skenes: normal    ?             Vagina: normal appearing vagina with normal color and discharge, no  lesions ?             Cervix:  1 cm mass at 12:00, nabothian cyst? ?               ?Endometrial biopsy and then cervical biopsy ?Consent done.  ?Sterile prep with Hibiclens.  ?Paracervical block with 10 cc 1% lidocaine, lot [id number], exp 10/10/22.  ?Os finder used.  ?Pipelle passed to 8 cm twice.  Large amount of tissue obtained and sent to pathology.  ?Recommendation made to biopsy  cervical mass.  ?Tichler used and specimen sent to pathology separately.  ?Silver nitrate placed on cervical biopsy site.  ?Minimal EBL.  ?No complications.  ? ?Chaperone was present for exam:  Estill Bamberg, CMA ? ?ASSESSMENT ? ?Postmenopausal bleeding.  ?Endometrial thickening.  ?Cervical mass.  ?ASCUS pap and negative HR HPV. ? ?PLAN ? ?Fu biopsies.  ?Post biopsy precautions.  ?Final plan to follow.  ?  ?An After Visit Summary was printed and given to the patient. ? ?  ? ?

## 2021-08-21 ENCOUNTER — Ambulatory Visit: Payer: Medicare HMO | Admitting: Obstetrics and Gynecology

## 2021-08-21 ENCOUNTER — Encounter: Payer: Self-pay | Admitting: Obstetrics and Gynecology

## 2021-08-21 ENCOUNTER — Other Ambulatory Visit (HOSPITAL_COMMUNITY)
Admission: RE | Admit: 2021-08-21 | Discharge: 2021-08-21 | Disposition: A | Discharge status: Home / Self Care | Payer: Medicare HMO | Source: Ambulatory Visit | Attending: Obstetrics and Gynecology | Admitting: Obstetrics and Gynecology

## 2021-08-21 ENCOUNTER — Other Ambulatory Visit: Payer: Self-pay

## 2021-08-21 VITALS — BP 122/62 | HR 64 | Resp 16 | Ht 62.0 in | Wt 154.0 lb

## 2021-08-21 DIAGNOSIS — R9389 Abnormal findings on diagnostic imaging of other specified body structures: Secondary | ICD-10-CM | POA: Insufficient documentation

## 2021-08-21 DIAGNOSIS — N888 Other specified noninflammatory disorders of cervix uteri: Secondary | ICD-10-CM

## 2021-08-21 DIAGNOSIS — N950 Postmenopausal bleeding: Secondary | ICD-10-CM

## 2021-08-21 DIAGNOSIS — C541 Malignant neoplasm of endometrium: Secondary | ICD-10-CM | POA: Diagnosis not present

## 2021-08-21 DIAGNOSIS — N841 Polyp of cervix uteri: Secondary | ICD-10-CM | POA: Diagnosis not present

## 2021-08-21 NOTE — Patient Instructions (Signed)
ENDOMETRIAL BIOPSY POST-PROCEDURE INSTRUCTIONS  You may take Ibuprofen, Aleve or Tylenol for pain if needed.  Cramping should resolve within in 24 hours.  You may have a small amount of spotting.  You should wear a mini pad for the next few days.  You may have intercourse after 24 hours.  You need to call if you have any pelvic pain, fever, heavy bleeding or foul smelling vaginal discharge.  Shower or bathe as normal  6. We will call you within one week with results or we will discuss   the results at your follow-up appointment if needed.   

## 2021-08-24 LAB — SURGICAL PATHOLOGY

## 2021-08-25 ENCOUNTER — Telehealth: Payer: Self-pay | Admitting: Obstetrics and Gynecology

## 2021-08-25 NOTE — Telephone Encounter (Signed)
Phone call with patient and her sister completed before end of business day today.  ? ?Patient informed of results of endometrial biopsy showing endometrial cancer.  ? ?She accepts referral to Dr. Valarie Cones for GYN ONC consultation.  ?Will proceed with referral.  ?

## 2021-08-25 NOTE — Telephone Encounter (Signed)
Phone call made in attempt to discuss results of endometrial biopsy results.  ? 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  ? 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  ? ?I will need to speak with the patient to explain her results, and I will make a recommendation for her to see GYN ONCOLOGY. ?

## 2021-08-28 ENCOUNTER — Other Ambulatory Visit: Payer: Self-pay

## 2021-08-28 ENCOUNTER — Other Ambulatory Visit: Payer: Self-pay | Admitting: Gynecologic Oncology

## 2021-08-28 ENCOUNTER — Telehealth: Payer: Self-pay

## 2021-08-28 DIAGNOSIS — C55 Malignant neoplasm of uterus, part unspecified: Secondary | ICD-10-CM

## 2021-08-28 DIAGNOSIS — C541 Malignant neoplasm of endometrium: Secondary | ICD-10-CM

## 2021-08-28 NOTE — Telephone Encounter (Signed)
Thank you so much.  ?Encounter reviewed and closed.  ?

## 2021-08-28 NOTE — Telephone Encounter (Signed)
Following up with Mikayla Harrison advised patient of the following appointments: ? ?Lab appointment [facility]: 08/30/21 at 2:15 pm. Patient will pickup contrast and instructions at this appointment.  ? ?CT Scan [facility]: 09/04/21 at 3:30. Patient to arrive by 3:15 pm. She is to be NPO 4 hours prior to scan. She will drink her 1st bottle of contrast at 1:30 pm and her second bottle at 2:30 pm. Instructions and appointment details for CT scan will be with contrast when patient picks it up on 08/30/21. ? ?Gyn Oncology with Dr. Jeral Pinch: 09/07/21 at 11:15 am. Patient to arrive 30 min prior to appointment. ? ?Reviewed all appointment times and details with patient. Provided patient with direct number to the GYN Oncology [phone]) and instructed to call with any questions or concerns.  ? ?Patient wants to ensure that CT scan will be covered under her insurance. Assured patient that our office is sending the scan for authorization and will notify her if authorization is not obtained. She verbalized understanding.  ?

## 2021-08-28 NOTE — Telephone Encounter (Signed)
Spoke with Ms. Mikayla Harrison regarding her referral to GYN oncology. She has an appointment scheduled with Dr. Berline Lopes on 09/07/21 at 11:15 am. Patient agrees to date and time. She has been provided with office address and location. She is also aware of our mask and visitor policy. Patient verbalized understanding and will call with any questions.    ? ?Advised patient that our office is going to go ahead and start working on some things for her. We are going to try to get a CT scan setup and performed before she sees Dr. Berline Lopes on March 30th. We will need to check labs (kidney functions) prior to the CT scan. ? ?We are also going to hold a surgery date for her on April 11th, 2023. She may receive a phone call from the hospital to arrange for a pre-operative appointment but this appointment will be after she sees Dr. Berline Lopes. All of these things that we have put in place can be changed if needed. We are trying to be proactive and have spots saved. ? ?Patient reports afternoon appointments work best for her. Advised that I will work on scheduling her appointments and follow up with her this afternoon. Patient verbalized understanding.  ?

## 2021-08-28 NOTE — Telephone Encounter (Signed)
Referral Placed in [place]. ?

## 2021-08-28 NOTE — Telephone Encounter (Signed)
Patient is scheduled for Dr. Berline Lopes on 09/07/2021. ?

## 2021-08-28 NOTE — Progress Notes (Signed)
Per Dr. 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 Plan to hold OR time for September 19, 2021. ?

## 2021-08-29 NOTE — Progress Notes (Signed)
I read the report of the pap which stated ASCUS.  I am not a pathologist and do not read cytology on pap smears.   Thank you.   Kagan Hietpas A. Edward Jolly, MD

## 2021-08-29 NOTE — Addendum Note (Signed)
 Encounter addended by: Patton Salles, MD on: 08/29/2021 9:13 PM  Actions taken: Clinical Note Signed

## 2021-08-30 ENCOUNTER — Inpatient Hospital Stay: Payer: Medicare HMO | Attending: Gynecologic Oncology

## 2021-08-30 ENCOUNTER — Other Ambulatory Visit: Payer: Self-pay

## 2021-08-30 ENCOUNTER — Telehealth: Payer: Self-pay

## 2021-08-30 DIAGNOSIS — C541 Malignant neoplasm of endometrium: Secondary | ICD-10-CM | POA: Insufficient documentation

## 2021-08-30 DIAGNOSIS — M722 Plantar fascial fibromatosis: Secondary | ICD-10-CM | POA: Diagnosis not present

## 2021-08-30 DIAGNOSIS — K5900 Constipation, unspecified: Secondary | ICD-10-CM | POA: Diagnosis not present

## 2021-08-30 DIAGNOSIS — Z780 Asymptomatic menopausal state: Secondary | ICD-10-CM | POA: Diagnosis not present

## 2021-08-30 DIAGNOSIS — G629 Polyneuropathy, unspecified: Secondary | ICD-10-CM | POA: Diagnosis not present

## 2021-08-30 DIAGNOSIS — Z7982 Long term (current) use of aspirin: Secondary | ICD-10-CM | POA: Diagnosis not present

## 2021-08-30 DIAGNOSIS — C55 Malignant neoplasm of uterus, part unspecified: Secondary | ICD-10-CM

## 2021-08-30 DIAGNOSIS — I129 Hypertensive chronic kidney disease with stage 1 through stage 4 chronic kidney disease, or unspecified chronic kidney disease: Secondary | ICD-10-CM | POA: Insufficient documentation

## 2021-08-30 DIAGNOSIS — Z79899 Other long term (current) drug therapy: Secondary | ICD-10-CM | POA: Diagnosis not present

## 2021-08-30 DIAGNOSIS — N183 Chronic kidney disease, stage 3 unspecified: Secondary | ICD-10-CM | POA: Insufficient documentation

## 2021-08-30 LAB — BASIC METABOLIC PANEL
Anion gap: 8 (ref 5–15)
BUN: 23 mg/dL (ref 8–23)
CO2: 27 mmol/L (ref 22–32)
Calcium: 8.9 mg/dL (ref 8.9–10.3)
Chloride: 101 mmol/L (ref 98–111)
Creatinine, Ser: 0.88 mg/dL (ref 0.44–1.00)
GFR, Estimated: >60 mL/min (ref >60)
Glucose, Bld: 93 mg/dL (ref 70–99)
Potassium: 4 mmol/L (ref 3.5–5.1)
Sodium: 136 mmol/L (ref 135–145)

## 2021-08-30 NOTE — Telephone Encounter (Signed)
Spoke with patient this afternoon and reviewed BMP results (normal). RN inquired if she was able to pick up the contrast for her CT scan today. Patient states " they did not have anything for me at the desk for my CT scan. I asked multiple times." Apologized to patient and informed her that she will need to pickup the contrast at the cancer center on Thursday or Friday of this week. Instructed patient to call our office directly if she has any issues with this. Patient verbalized understanding. ? ?RN assured that patient contrast is at the cancer center registation desk with her name clearly marked on it.  ? ?

## 2021-08-31 ENCOUNTER — Ambulatory Visit: Payer: Medicare HMO | Admitting: Gynecologic Oncology

## 2021-08-31 ENCOUNTER — Telehealth: Payer: Self-pay

## 2021-08-31 NOTE — Telephone Encounter (Signed)
Patient left message requesting return call. Attempted to contact patient,unable to reach her. Left message requesting return call.  ?

## 2021-09-01 NOTE — Telephone Encounter (Signed)
Following up with patient requesting a return call.  Mikayla Harrison was concerned about driving home in the dark after her CT scan at 3:30 on Monday.  I told her that CT scans usually take an hour or less, so she should be home by dark. She was very pleased.  ? ?Reviewed instructions of CT scan with her, appt time and NPO after 11:30.  ? ?Patient is picking up her contrast today at the cancer center.  ?

## 2021-09-04 ENCOUNTER — Ambulatory Visit (HOSPITAL_COMMUNITY)
Admission: RE | Admit: 2021-09-04 | Discharge: 2021-09-04 | Disposition: A | Discharge status: Home / Self Care | Payer: Medicare HMO | Source: Ambulatory Visit | Attending: Gynecologic Oncology | Admitting: Gynecologic Oncology

## 2021-09-04 ENCOUNTER — Other Ambulatory Visit: Payer: Self-pay

## 2021-09-04 DIAGNOSIS — C55 Malignant neoplasm of uterus, part unspecified: Secondary | ICD-10-CM | POA: Insufficient documentation

## 2021-09-04 DIAGNOSIS — C541 Malignant neoplasm of endometrium: Secondary | ICD-10-CM | POA: Diagnosis not present

## 2021-09-04 DIAGNOSIS — E278 Other specified disorders of adrenal gland: Secondary | ICD-10-CM | POA: Diagnosis not present

## 2021-09-04 MED ORDER — IOHEXOL 300 MG/ML  SOLN
100.0000 mL | Freq: Once | INTRAMUSCULAR | Status: AC | PRN
  Administered 2021-09-04: 100 mL via INTRAVENOUS

## 2021-09-05 ENCOUNTER — Telehealth: Payer: Self-pay | Admitting: *Deleted

## 2021-09-05 NOTE — Telephone Encounter (Signed)
Spoke with pt this afternoon. Pt stated that her other doctor told her she needed to bring someone else to her 1st visit with Dr.Tucker. She stated everyone is busy and she doesn't want to bother them and wondered if it's absolutely necessary to bring someone with her. Educated pt that it's not absolutely necessary to have someone with her if she can't find someone to come with her. Pt verbalized understanding and did not voice any other concerns.  ?

## 2021-09-07 ENCOUNTER — Telehealth: Payer: Self-pay | Admitting: *Deleted

## 2021-09-07 ENCOUNTER — Encounter: Payer: Self-pay | Admitting: Gynecologic Oncology

## 2021-09-07 ENCOUNTER — Inpatient Hospital Stay (HOSPITAL_BASED_OUTPATIENT_CLINIC_OR_DEPARTMENT_OTHER): Payer: Medicare HMO | Admitting: Gynecologic Oncology

## 2021-09-07 ENCOUNTER — Other Ambulatory Visit: Payer: Self-pay

## 2021-09-07 ENCOUNTER — Ambulatory Visit (HOSPITAL_BASED_OUTPATIENT_CLINIC_OR_DEPARTMENT_OTHER)
Admission: RE | Admit: 2021-09-07 | Discharge: 2021-09-07 | Disposition: A | Discharge status: Home / Self Care | Payer: Medicare HMO | Source: Ambulatory Visit | Attending: Gynecologic Oncology | Admitting: Gynecologic Oncology

## 2021-09-07 VITALS — BP 139/70 | HR 72 | Temp 98.5°F | Resp 16 | Ht 62.0 in | Wt 153.1 lb

## 2021-09-07 DIAGNOSIS — Z79899 Other long term (current) drug therapy: Secondary | ICD-10-CM | POA: Diagnosis not present

## 2021-09-07 DIAGNOSIS — C55 Malignant neoplasm of uterus, part unspecified: Secondary | ICD-10-CM

## 2021-09-07 DIAGNOSIS — C541 Malignant neoplasm of endometrium: Secondary | ICD-10-CM

## 2021-09-07 DIAGNOSIS — R600 Localized edema: Secondary | ICD-10-CM

## 2021-09-07 DIAGNOSIS — Z7982 Long term (current) use of aspirin: Secondary | ICD-10-CM | POA: Diagnosis not present

## 2021-09-07 DIAGNOSIS — Z780 Asymptomatic menopausal state: Secondary | ICD-10-CM | POA: Diagnosis not present

## 2021-09-07 DIAGNOSIS — I129 Hypertensive chronic kidney disease with stage 1 through stage 4 chronic kidney disease, or unspecified chronic kidney disease: Secondary | ICD-10-CM | POA: Diagnosis not present

## 2021-09-07 DIAGNOSIS — G629 Polyneuropathy, unspecified: Secondary | ICD-10-CM | POA: Diagnosis not present

## 2021-09-07 DIAGNOSIS — M722 Plantar fascial fibromatosis: Secondary | ICD-10-CM | POA: Diagnosis not present

## 2021-09-07 DIAGNOSIS — K5900 Constipation, unspecified: Secondary | ICD-10-CM | POA: Diagnosis not present

## 2021-09-07 DIAGNOSIS — E663 Overweight: Secondary | ICD-10-CM

## 2021-09-07 DIAGNOSIS — N183 Chronic kidney disease, stage 3 unspecified: Secondary | ICD-10-CM | POA: Diagnosis not present

## 2021-09-07 NOTE — Progress Notes (Signed)
 GYNECOLOGIC ONCOLOGY NEW PATIENT CONSULTATION   Patient Name: Mikayla Harrison  Patient Age: 88 y.o. Date of Service: 08/11/2021 Referring Provider: Dr. Wyvonnia Lora  Primary Care Provider: Lucky Cowboy, MD Consulting Provider: Eugene Garnet, MD   Assessment/Plan:  Postmenopausal patient with clinical stage I endometrial cancer.  We reviewed the nature of endometrial cancer and its recommended surgical staging, including total hysterectomy, bilateral salpingo-oophorectomy, and lymph node assessment. The patient is a suitable candidate for staging via a minimally invasive approach to surgery.  We reviewed that robotic assistance would be used to complete the surgery.   Given the patient's age and medical comorbidities, she would need clearance from her primary care provider.  She has significant concerns about major surgery.  We spent quite some time today discussing these concerns as well as options for modifying treatment plans.  This includes deferring any sort of lymph node evaluation and deferring for lymph node dissection if the patient does not map on sentinel lymph node injection.  We discussed that most endometrial cancer is detected early, however, we reviewed that adjuvant therapy may be recommended based on the patient's biopsy, however, we will defer to final pathology results. Given her high risk histology, CT scan was obtained preoperatively to rule out metastatic disease.  This is negative for definitive evidence of metastatic disease.  Patient inquired about nonsurgical treatment options.  I stressed my concern about the clear cell component seen on her biopsy.  While this may be overall low grade endometrioid adenocarcinoma with foci or small component of clear cell carcinoma, we discussed that this is a high-grade cell type which can be more aggressive and difficult to treat.  The patient is reassured given CT findings that do not show definitive evidence of metastatic disease.   I discussed the limitations of CT scan, especially in the setting of microscopic metastatic disease.  The gold standard for staging is with surgery, not imaging.  We then discussed surgery.  We reviewed the sentinel lymph node technique. Risks and benefits of sentinel lymph node biopsy was reviewed. We reviewed the technique and ICG dye. The patient DOES NOT have an iodine allergy or known liver dysfunction. We reviewed the false negative rate (0.4%), and that 3% of patients with metastatic disease will not have it detected by SLN biopsy in endometrial cancer. A low risk of allergic reaction to the dye, <0.2% for ICG, has been reported. We also discussed that in the case of failed mapping, which occurs 40% of the time, a bilateral or unilateral lymphadenectomy will be performed at the surgeon's discretion.   Potential benefits of sentinel nodes including a higher detection rate for metastasis due to ultrastaging and potential reduction in operative morbidity. However, there remains uncertainty as to the role for treatment of micrometastatic disease. Further, the benefit of operative morbidity associated with the SLN technique in endometrial cancer is not yet completely known. In other patient populations (e.g. the cervical cancer population) there has been observed reductions in morbidity with SLN biopsy compared to pelvic lymphadenectomy. Lymphedema, nerve dysfunction and lymphocysts are all potential risks with the SLN technique as with complete lymphadenectomy. Additional risks to the patient include the risk of damage to an internal organ while operating in an altered view (e.g. the black and white image of the robotic fluorescence imaging mode).   Reviewed the tentative plan for a robotic assisted hysterectomy, bilateral salpingo-oophorectomy, sentinel lymph node evaluation, possible lymph node dissection, possible laparotomy. The risks of surgery were discussed in detail  and she understands these to  include infection; wound separation; hernia; vaginal cuff separation, injury to adjacent organs such as bowel, bladder, blood vessels, ureters and nerves; bleeding which may require blood transfusion; anesthesia risk; thromboembolic events; possible death; unforeseen complications; possible need for re-exploration; medical complications such as heart attack, stroke, pleural effusion and pneumonia; and, if full lymphadenectomy is performed the risk of lymphedema and lymphocyst. The patient will receive DVT and antibiotic prophylaxis as indicated. She voiced a clear understanding. She had the opportunity to ask questions. Perioperative instructions were reviewed with her.   The patient asked for some time to think about treatment options.  She will call and let me know if she is amenable to proceeding with the surgery that we discussed today.  If she ultimately elects not to proceed with staging surgery, I would recommend D&C for further tissue sampling to help guide discussion of nonsurgical treatment options and better delineate the component of high risk histology that her tumor comprises.  Given the left lower leg edema greater than the right, I recommended that we get a lower extremity Doppler.  This was scheduled for today.  A copy of this note was sent to the patient's referring provider.   65 minutes of total time was spent for this patient encounter, including preparation, face-to-face counseling with the patient and coordination of care, and documentation of the encounter.  Eugene Garnet, MD  Division of Gynecologic Oncology  Department of Obstetrics and Gynecology  [facility]  ___________________________________________  Chief Complaint: Chief Complaint  Patient presents with   Clear cell adenocarcinoma of uterus [facility])    History of Present Illness:  Mikayla Harrison is a 88 y.o. y.o. female who is seen in consultation at the request of Dr. Edward Jolly for an  evaluation of endometrial cancer.  Patient initially saw her OB/GYN in January with postmenopausal bleeding that began in June 2022.  She initially had what she describes as a "gush of mucus" with subsequent discharge and bleeding.  Patient notes that after an episode like a menses, her bleeding stopped.  She then had intermittent episodes of bleeding.  She describes to be bleeding as not happening daily, not associated with any clots.  She wears a pad although sometimes there is very little on the pad and sometimes more.  She denies any associated pain or cramping.  She endorses a good appetite without nausea or emesis.  Denies any weight changes.  She is constipated intermittently at baseline, mostly does not need to use any medications.  Denies any change to bladder function.  Patient had a Pap smear performed which showed ASCUS Pap, high-risk HPV not detected.  She then underwent pelvic ultrasound exam on 07/20/2021 with a uterus measuring 7.3 x 4.9 x 3.6 cm and an endometrial lining of 29 mm.  Endometrium was noted to be heterogenous with solid and cystic components, slightly vascular.  Right ovary atrophic, left ovary not seen.  Patient then represented on 3/13 for endometrial biopsy.  Cervical biopsy was also performed.  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  IHC staining shows clear cell carcinoma pattern and high-grade foci with the remainder of the carcinoma within the endometrioid and low-grade pattern.  Cervical biopsy shows mesodermal stromal polyp, squamous mucosa negative for dysplasia or malignancy.  CT of the chest, abdomen, and pelvis was performed on 3/27.  This showed a low-attenuation heterogenously enhancing masslike area in the left side of the uterine  body and fundus measuring 1.7 x 1.9 x 3.5 cm.  No definitive findings to suggest metastatic disease.  Small left adrenal nodule, stable compared to prior study from 2015  and considered likely benign.  History is notable for an admission with abdominal pain, nausea and emesis approximately 10 years ago where she was found to have a partial small bowel obstruction on imaging.  This resolved with conservative measures.  She denies any further episodes since.  PAST MEDICAL HISTORY:  Past Medical History:  Diagnosis Date   CKD (chronic kidney disease) stage 3, GFR 30-59 ml/min (HCC)    Colon polyp    Diverticulosis    Hypertension    Iron deficiency    Peripheral neuropathy    in bilateral legs   Plantar fasciitis    Prediabetes    Small bowel obstruction (HCC)      PAST SURGICAL HISTORY:  Past Surgical History:  Procedure Laterality Date   CATARACT EXTRACTION     EYE SURGERY     MACULAR TEAR     Vericose vein  1965    OB/GYN HISTORY:  OB History  Gravida Para Term Preterm AB Living  5 5       5   SAB IAB Ectopic Multiple Live Births               # Outcome Date GA Lbr Len/2nd Weight Sex Delivery Anes PTL Lv  5 Para           4 Para           3 Para           2 Para           1 Para             No LMP recorded. Patient is postmenopausal.  Age at menarche: 73 Age at menopause: Approximately 81 Hx of HRT: Denies Hx of STDs: Denies Last pap: See HPI History of abnormal pap smears: Other than most recent, denies  SCREENING STUDIES:  Last mammogram: 2011  Last colonoscopy: 2004  MEDICATIONS: Outpatient Encounter Medications as of 09/07/2021  Medication Sig   acetaminophen (TYLENOL) 325 MG tablet Take 325 mg by mouth every 6 (six) hours as needed.   amLODipine (NORVASC) 10 MG tablet TAKE 1/2 TO 1 (ONE-HALF TO ONE) TABLET BY MOUTH ONCE DAILY FOR BLOOD PRESSURE   Ascorbic Acid (VITAMIN C PO) Take 1 tablet by mouth daily.   aspirin 81 MG tablet Take 81 mg by mouth daily.   atenolol (TENORMIN) 50 MG tablet Take  1 tablet  Daily  for BP                         /       Take 1 tablet by mouth once daily for blood pressure   benazepril  (LOTENSIN) 40 MG tablet Take 1 tablet by mouth once daily for blood pressure   Cholecalciferol (VITAMIN D3) 1000 units CAPS Take 1,000 Units by mouth 2 (two) times daily.   clorazepate (TRANXENE) 7.5 MG tablet Take 1/2 - 1 tablet 2 - 3 x /day ONLY if needed for Anxiety Attack &  limit to 5 days /week to avoid Addiction & Dementia   clotrimazole (LOTRIMIN) 1 % cream Apply 1 application topically 2 (two) times daily.   Magnesium 500 MG TABS Take 2 tablets by mouth daily.   meclizine (ANTIVERT) 25 MG tablet TAKE 1/2 TO 1 (ONE-HALF TO ONE) TABLET  BY MOUTH THREE TIMES DAILY FOR  MOTION  SICKNESS  AND  DIZZINESS   SYNTHROID 150 MCG tablet Take  1 tablet  Daily  on an empty stomach with only water for 30 minutes & no Antacid meds, Calcium or Magnesium for 4 hours & avoid Biotin   vitamin B-12 (CYANOCOBALAMIN) 500 MCG tablet Take 500 mcg by mouth daily.   zinc gluconate 50 MG tablet Take 50 mg by mouth. Takes 1/2 tablet daily.   BINAXNOW COVID-19 AG HOME TEST KIT Use as Directed on the Package   No facility-administered encounter medications on file as of 09/07/2021.    ALLERGIES:  Allergies  Allergen Reactions   Other Other (See Comments)   Sulfa Antibiotics Other (See Comments)   Asa [Aspirin] Other (See Comments)    Take 81mg  cannot take 325mg  gets nervous   Codeine Nausea Only   Prednisone Nausea And Vomiting, Nausea Only and Other (See Comments)     FAMILY HISTORY:  Family History  Problem Relation Age of Onset   Heart disease Mother    Stroke Mother    Heart disease Father    Colon polyps Sister    Diabetes Sister    Stroke Sister    Heart disease Brother    Stroke Brother    Hypertension Son    Kidney cancer Nephew      SOCIAL HISTORY:  Social Connections: Not on file    REVIEW OF SYSTEMS:  Pertinent positives include constipation, joint pain. Denies appetite changes, fevers, chills, fatigue, unexplained weight changes. Denies hearing loss, neck lumps or masses, mouth  sores, ringing in ears or voice changes. Denies cough or wheezing.  Denies shortness of breath. Denies chest pain or palpitations. Denies leg swelling. Denies abdominal distention, pain, blood in stools, diarrhea, nausea, vomiting, or early satiety. Denies pain with intercourse, dysuria, frequency, hematuria or incontinence. Denies hot flashes, pelvic pain, vaginal bleeding or vaginal discharge.   Denies back pain or muscle pain/cramps. Denies itching, rash, or wounds. Denies dizziness, headaches, numbness or seizures. Denies swollen lymph nodes or glands, denies easy bruising or bleeding. Denies anxiety, depression, confusion, or decreased concentration.  Physical Exam:  Vital Signs for this encounter:  Blood pressure 139/70, pulse 72, temperature 98.5 F (36.9 C), temperature source Oral, resp. rate 16, height 5\' 2"  (1.575 m), weight 153 lb 1.6 oz (69.4 kg), SpO2 100 %. Body mass index is 28 kg/m. General: Alert, oriented, no acute distress.  HEENT: Normocephalic, atraumatic. Sclera anicteric.  Chest: Clear to auscultation bilaterally. No wheezes, rhonchi, or rales. Cardiovascular: Regular rate and rhythm, no murmurs, rubs, or gallops.  Abdomen: Normoactive bowel sounds. Soft, moderately distended in the lower abdomen and tympanitic, nontender to palpation.  Significant rectus diastases.  No masses or hepatosplenomegaly appreciated. No palpable fluid wave.  Extremities: Grossly normal range of motion. Warm, well perfused.  1+ edema bilaterally, more notable on the left lower extremity along the lower anterior and lateral leg.  Skin: No rashes or lesions.  Lymphatics: No cervical, supraclavicular, or inguinal adenopathy.  GU:  Normal external female genitalia. No lesions. No discharge or bleeding.             Bladder/urethra:  No lesions or masses, well supported bladder             Vagina: Moderate vaginal atrophy.             Cervix: 3-4 cm in size, somewhat prominent mass with  slightly atypical vascularity along the anterior cervix that  looks consistent with a nabothian cyst, otherwise normal-appearing.             Uterus: Small, mobile, no parametrial involvement or nodularity.             Adnexa: No masses appreciated.  Rectal: Deferred.  LABORATORY AND RADIOLOGIC DATA:  Outside medical records were reviewed to synthesize the above history, along with the history and physical obtained during the visit.   Lab Results  Component Value Date   WBC 9.1 06/19/2021   HGB 10.5 (L) 06/19/2021   HCT 31.6 (L) 06/19/2021   PLT 312 06/19/2021   GLUCOSE 93 08/30/2021   CHOL 142 02/09/2021   TRIG 91 02/09/2021   HDL 40 (L) 02/09/2021   LDLCALC 83 02/09/2021   ALT 13 06/19/2021   AST 18 06/19/2021   NA 136 08/30/2021   K 4.0 08/30/2021   CL 101 08/30/2021   CREATININE 0.88 08/30/2021   BUN 23 08/30/2021   CO2 27 08/30/2021   TSH 0.83 06/19/2021   INR 1.03 11/06/2016   HGBA1C 5.8 (H) 02/09/2021   MICROALBUR 0.6 02/09/2021

## 2021-09-07 NOTE — Telephone Encounter (Signed)
Vascular called and reported negative for DVT bilaterally on the ultrasound of the lower extremity venous. Joylene John, NP notified.  ?

## 2021-09-07 NOTE — Patient Instructions (Signed)
 Instructions for surgery are below for your review. Please call the office at [phone] when you have decided on how you would like to proceed.   Dr. Pricilla Holm would like for you to have a doppler, which is like an ultrasound of your legs, to make sure there are no blood clots before surgery.   Preparing for your Surgery   Plan for surgery on September 19, 2021 with Dr. Eugene Garnet at [facility]. You will be scheduled for robotic assisted total laparoscopic hysterectomy (removal of the uterus and cervix), bilateral salpingo-oophorectomy (removal of both ovaries and fallopian tubes), sentinel lymph node biopsy, possible lymph node dissection, possible laparotomy (larger incision on your abdomen if needed), possible staging.    If you are doing well, we usually send patients home the same day of surgery and you would need someone to drive you home and to stay with you for the first 24 hours while the anesthesia is getting out of your system.   Pre-operative Testing -You will receive a phone call from presurgical testing at [facility] to arrange for a pre-operative appointment and lab work.   -Bring your insurance card, copy of an advanced directive if applicable, medication list   -At that visit, you will be asked to sign a consent for a possible blood transfusion in case a transfusion becomes necessary during surgery.  The need for a blood transfusion is rare but having consent is a necessary part of your care.      -You should not be taking blood thinners or aspirin at least ten days prior to surgery unless instructed by your surgeon.   -Do not take supplements such as fish oil (omega 3), red yeast rice, turmeric before your surgery. You want to avoid medications with aspirin in them including headache powders such as BC or Goody's), Excedrin migraine.   Day Before Surgery at Home -You will be asked to take in a light diet the day before surgery. You will be advised you can  have clear liquids up until 3 hours before your surgery.     Eat a light diet the day before surgery.  Examples including soups, broths, toast, yogurt, mashed potatoes.  AVOID GAS PRODUCING FOODS. Things to avoid include carbonated beverages (fizzy beverages, sodas), raw fruits and raw vegetables (uncooked), or beans.    If your bowels are filled with gas, your surgeon will have difficulty visualizing your pelvic organs which increases your surgical risks.   Your role in recovery Your role is to become active as soon as directed by your doctor, while still giving yourself time to heal.  Rest when you feel tired. You will be asked to do the following in order to speed your recovery:   - Cough and breathe deeply. This helps to clear and expand your lungs and can prevent pneumonia after surgery.  - STAY ACTIVE WHEN YOU GET HOME. Do mild physical activity. Walking or moving your legs help your circulation and body functions return to normal. Do not try to get up or walk alone the first time after surgery.   -If you develop swelling on one leg or the other, pain in the back of your leg, redness/warmth in one of your legs, please call the office or go to the Emergency Room to have a doppler to rule out a blood clot. For shortness of breath, chest pain-seek care in the Emergency Room as soon as possible. - Actively manage your pain. Managing your pain lets you  move in comfort. We will ask you to rate your pain on a scale of zero to 10. It is your responsibility to tell your doctor or nurse where and how much you hurt so your pain can be treated.   Special Considerations -If you are diabetic, you may be placed on insulin after surgery to have closer control over your blood sugars to promote healing and recovery.  This does not mean that you will be discharged on insulin.  If applicable, your oral antidiabetics will be resumed when you are tolerating a solid diet.   -Your final pathology results from surgery  should be available around one week after surgery and the results will be relayed to you when available.   -Dr. Antionette Char is the surgeon that assists your GYN Oncologist with surgery.  If you end up staying the night, the next day after your surgery you will either see Dr. Pricilla Holm or Dr. Antionette Char.   -FMLA forms can be faxed to [phone] and please allow 5-7 business days for completion.   Pain Management After Surgery -You will be prescribed your pain medication and bowel regimen medications before surgery so that you can have these available when you are discharged from the hospital. The pain medication is for use ONLY AFTER surgery and a new prescription will not be given.    -Make sure that you have Tylenol and Ibuprofen IF YOU ARE ABLE TO TAKE THESE MEDICATIONS at home to use on a regular basis after surgery for pain control. We recommend alternating the medications every hour to six hours since they work differently and are processed in the body differently for pain relief.   -Review the attached handout on narcotic use and their risks and side effects.    Bowel Regimen -You will be prescribed Sennakot-S to take nightly to prevent constipation especially if you are taking the narcotic pain medication intermittently.  It is important to prevent constipation and drink adequate amounts of liquids. You can stop taking this medication when you are not taking pain medication and you are back on your normal bowel routine.   Risks of Surgery Risks of surgery are low but include bleeding, infection, damage to surrounding structures, re-operation, blood clots, and very rarely death.     Blood Transfusion Information (For the consent to be signed before surgery)   We will be checking your blood type before surgery so in case of emergencies, we will know what type of blood you would need.                                             WHAT IS A BLOOD TRANSFUSION?   A transfusion  is the replacement of blood or some of its parts. Blood is made up of multiple cells which provide different functions. Red blood cells carry oxygen and are used for blood loss replacement. White blood cells fight against infection. Platelets control bleeding. Plasma helps clot blood. Other blood products are available for specialized needs, such as hemophilia or other clotting disorders. BEFORE THE TRANSFUSION  Who gives blood for transfusions?  You may be able to donate blood to be used at a later date on yourself (autologous donation). Relatives can be asked to donate blood. This is generally not any safer than if you have received blood from a stranger. The same precautions are taken to ensure  safety when a relative's blood is donated. Healthy volunteers who are fully evaluated to make sure their blood is safe. This is blood bank blood. Transfusion therapy is the safest it has ever been in the practice of medicine. Before blood is taken from a donor, a complete history is taken to make sure that person has no history of diseases nor engages in risky social behavior (examples are intravenous drug use or sexual activity with multiple partners). The donor's travel history is screened to minimize risk of transmitting infections, such as malaria. The donated blood is tested for signs of infectious diseases, such as HIV and hepatitis. The blood is then tested to be sure it is compatible with you in order to minimize the chance of a transfusion reaction. If you or a relative donates blood, this is often done in anticipation of surgery and is not appropriate for emergency situations. It takes many days to process the donated blood. RISKS AND COMPLICATIONS Although transfusion therapy is very safe and saves many lives, the main dangers of transfusion include:  Getting an infectious disease. Developing a transfusion reaction. This is an allergic reaction to something in the blood you were given. Every  precaution is taken to prevent this. The decision to have a blood transfusion has been considered carefully by your caregiver before blood is given. Blood is not given unless the benefits outweigh the risks.   AFTER SURGERY INSTRUCTIONS   Return to work: 4-6 weeks if applicable   Activity: 1. Be up and out of the bed during the day.  Take a nap if needed.  You may walk up steps but be careful and use the hand rail.  Stair climbing will tire you more than you think, you may need to stop part way and rest.    2. No lifting or straining for 6 weeks over 10 pounds. No pushing, pulling, straining for 6 weeks.   3. No driving for around 1 week(s).  Do not drive if you are taking narcotic pain medicine and make sure that your reaction time has returned.    4. You can shower as soon as the next day after surgery. Shower daily.  Use your regular soap and water (not directly on the incision) and pat your incision(s) dry afterwards; don't rub.  No tub baths or submerging your body in water until cleared by your surgeon. If you have the soap that was given to you by pre-surgical testing that was used before surgery, you do not need to use it afterwards because this can irritate your incisions.    5. No sexual activity and nothing in the vagina for 8 weeks.   6. You may experience a small amount of clear drainage from your incisions, which is normal.  If the drainage persists, increases, or changes color please call the office.   7. Do not use creams, lotions, or ointments such as neosporin on your incisions after surgery until advised by your surgeon because they can cause removal of the dermabond glue on your incisions.     8. You may experience vaginal spotting after surgery or around the 6-8 week mark from surgery when the stitches at the top of the vagina begin to dissolve.  The spotting is normal but if you experience heavy bleeding, call our office.   9. Take Tylenol or ibuprofen first for pain and  only use narcotic pain medication for severe pain not relieved by the Tylenol or Ibuprofen.  Monitor your Tylenol intake to  a max of 4,000 mg in a 24 hour period. You can alternate these medications after surgery.   Diet: 1. Low sodium Heart Healthy Diet is recommended but you are cleared to resume your normal (before surgery) diet after your procedure.   2. It is safe to use a laxative, such as Miralax or Colace, if you have difficulty moving your bowels. You have been prescribed Sennakot-S to take at bedtime every evening after surgery to keep bowel movements regular and to prevent constipation.     Wound Care: 1. Keep clean and dry.  Shower daily.   Reasons to call the Doctor: Fever - Oral temperature greater than 100.4 degrees Fahrenheit Foul-smelling vaginal discharge Difficulty urinating Nausea and vomiting Increased pain at the site of the incision that is unrelieved with pain medicine. Difficulty breathing with or without chest pain New calf pain especially if only on one side Sudden, continuing increased vaginal bleeding with or without clots.   Contacts: For questions or concerns you should contact:   Dr. Eugene Garnet at [phone]   Warner Mccreedy, NP at [phone]   After Hours: call [phone] and have the GYN Oncologist paged/contacted (after 5 pm or on the weekends).   Messages sent via mychart are for non-urgent matters and are not responded to after hours so for urgent needs, please call the after hours number.

## 2021-09-07 NOTE — Progress Notes (Signed)
Anesthesia Review: ? ?PCP: Gillian Shields LOV 06/19/21  ?Cardiologist : ?Chest x-ray : ?CT chest- 09/04/21  ?EKG :02/09/21  ?Echo : ?Stress test: ?Cardiac Cath :  ?Activity level:  ?Sleep Study/ CPAP : ?Fasting Blood Sugar :      / Checks Blood Sugar -- times a day:   ?Blood Thinner/ Instructions /Last Dose: ?ASA / Instructions/ Last Dose :   ?

## 2021-09-07 NOTE — Progress Notes (Signed)
Bilateral lower extremity venous duplex has been completed. ?Preliminary results can be found in CV Proc through chart review.  ?Results were given to [facility] at [facility]' NP office. ? ?09/07/21 1:10 PM ?Carlos Levering RVT   ?

## 2021-09-08 DIAGNOSIS — C541 Malignant neoplasm of endometrium: Secondary | ICD-10-CM | POA: Insufficient documentation

## 2021-09-08 DIAGNOSIS — R600 Localized edema: Secondary | ICD-10-CM | POA: Insufficient documentation

## 2021-09-08 NOTE — Progress Notes (Signed)
Patient here for new patient consultation with Dr. Jeral Pinch and for a pre-operative discussion prior to her scheduled surgery on September 19, 2021. She is scheduled for robotic assisted total laparoscopic hysterectomy, bilateral salpingo-oophorectomy, sentinel lymph node biopsy, possible lymph node dissection, possible laparotomy, possible staging. The surgery was discussed in detail.  See after visit summary for additional details. Visual aids used to discuss items related to surgery including the incentive spirometer, sequential compression stockings, foley catheter, IV pump, multi-modal pain regimen including tylenol, photo of the surgical robot, female reproductive system to discuss surgery in detail.    ?  ?Discussed post-op pain management in detail including the aspects of the enhanced recovery pathway.  Advised her that a new prescription would be sent in for tramadol if she decides to proceed with surgery and it is only to be used for after her upcoming surgery.  We discussed the use of tylenol post-op and to monitor for a maximum of 4,000 mg in a 24 hour period. Will also plan to prescribe sennakot to be used after surgery and to hold if having loose stools.  Discussed bowel regimen in detail.   ?  ?Discussed the use of SCDs and measures to take at home to prevent DVT including frequent mobility.  Reportable signs and symptoms of DVT discussed. Post-operative instructions discussed and expectations for after surgery. Incisional care discussed as well including reportable signs and symptoms including erythema, drainage, wound separation.  ?   ?10 minutes spent with the patient.  Verbalizing understanding of material discussed. No needs or concerns voiced at the end of the visit.   Advised patient to call for any needs. At end of visit, pt escorted [facility] for doppler to rule out DVT. She is to call our office with her decision about moving forward with surgery.   ? ?This appointment is included in the  global surgical bundle as pre-operative teaching and has no charge.     ?

## 2021-09-08 NOTE — Progress Notes (Signed)
DUE TO COVID-19 ONLY 2 VISITOR IS ALLOWED TO COME WITH YOU AND STAY IN THE WAITING ROOM ONLY DURING PRE OP AND PROCEDURE DAY OF SURGERY.   4 VISITOR  MAY VISIT WITH YOU AFTER SURGERY IN YOUR PRIVATE ROOM DURING VISITING HOURS ONLY! ?YOU MAY HAVE ONE PERSON SPEND THE NITE WITH YOU IN YOUR ROOM AFTER SURGERY.   ? ? ? ? Your procedure is scheduled on:  ?  09/19/2021  ? Report to [facility] Main  Entrance ? ? Report to admitting at    0515            AM ?DO NOT [place], PICTURE ID OR WALLET DAY OF SURGERY.  ?  ? ? Call this number if you have problems the morning of surgery [phone]  ?Eat a light diet the day before surgery.  Avoid gas producing foods.   ? REMEMBER: NO  SOLID FOODS , CANDY, GUM OR MINTS AFTER MIDNITE THE NITE BEFORE SURGERY .       [place] [employer] CLEAR LIQUIDS UNTIL      0430am          DAY OF SURGERY.      PLEASE FINISH ENSURE DRINK PER SURGEON ORDER  WHICH NEEDS TO BE COMPLETED AT      0430am      MORNING OF SURGERY.   ? ? ? ? ?CLEAR LIQUID DIET ? ? ?Foods Allowed      ?WATER ?BLACK COFFEE ( SUGAR OK, NO MILK, CREAM OR CREAMER) REGULAR AND DECAF  ?TEA ( SUGAR OK NO MILK, CREAM, OR CREAMER) REGULAR AND DECAF  ?PLAIN JELLO ( NO RED)  ?FRUIT ICES ( NO RED, NO FRUIT PULP)  ?POPSICLES ( NO RED)  ?JUICE- APPLE, WHITE GRAPE AND WHITE CRANBERRY  ?SPORT DRINK LIKE GATORADE ( NO RED)  ?CLEAR BROTH ( VEGETABLE , CHICKEN OR BEEF)                                                               ? ?    ? ?BRUSH YOUR TEETH MORNING OF SURGERY AND RINSE YOUR MOUTH OUT, NO CHEWING GUM CANDY OR MINTS. ?  ? ? Take these medicines the morning of surgery with A SIP OF WATER: amlodipine, atenolol, synthroid  ? ? ?DO NOT TAKE ANY DIABETIC MEDICATIONS DAY OF YOUR SURGERY ?                  ?            You may not have any metal on your body including hair pins and  ?            piercings  Do not wear jewelry, make-up, lotions, powders or perfumes, deodorant ?            Do not wear nail polish on your fingernails.    ?           IF YOU ARE A FEMALE AND WANT TO SHAVE UNDER ARMS OR LEGS PRIOR TO SURGERY YOU MUST DO SO AT LEAST 48 HOURS PRIOR TO SURGERY.  ?            Men may shave face and neck. ? ? Do not bring valuables to the hospital. [facility] NOT ?  RESPONSIBLE   FOR VALUABLES. ? Contacts, dentures or bridgework may not be worn into surgery. ? Leave suitcase in the car. After surgery it may be brought to your room. ? ?  ? Patients discharged the day of surgery will not be allowed to drive home. IF YOU ARE HAVING SURGERY AND GOING HOME THE SAME DAY, YOU MUST HAVE AN ADULT TO DRIVE YOU HOME AND BE WITH YOU FOR 24 HOURS. YOU MAY GO HOME BY TAXI OR UBER OR ORTHERWISE, BUT AN ADULT MUST ACCOMPANY YOU HOME AND STAY WITH YOU FOR 24 HOURS. ?  ? ?            Please read over the following fact sheets you were given: ?_____________________________________________________________________ ? ?[place] - Preparing for Surgery ?Before surgery, you can play an important role.  Because skin is not sterile, your skin needs to be as free of germs as possible.  You can reduce the number of germs on your skin by washing with CHG (chlorahexidine gluconate) soap before surgery.  CHG is an antiseptic cleaner which kills germs and bonds with the skin to continue killing germs even after washing. ?Please DO NOT use if you have an allergy to CHG or antibacterial soaps.  If your skin becomes reddened/irritated stop using the CHG and inform your nurse when you arrive at Short Stay. ?Do not shave (including legs and underarms) for at least 48 hours prior to the first CHG shower.  You may shave your face/neck. ?Please follow these instructions carefully: ? 1.  Shower with CHG Soap the night before surgery and the  morning of Surgery. ? 2.  If you choose to wash your hair, wash your hair first as usual with your  normal  shampoo. ? 3.  After you shampoo, rinse your hair and body thoroughly to remove the  shampoo.                            4.  Use CHG as you would any other liquid soap.  You can apply chg directly  to the skin and wash  ?                     Gently with a scrungie or clean washcloth. ? 5.  Apply the CHG Soap to your body ONLY FROM THE NECK DOWN.   Do not use on face/ open      ?                     Wound or open sores. Avoid contact with eyes, ears mouth and genitals (private parts).  ?                     Production manager,  Genitals (private parts) with your normal soap. ?            6.  Wash thoroughly, paying special attention to the area where your surgery  will be performed. ? 7.  Thoroughly rinse your body with warm water from the neck down. ? 8.  DO NOT shower/wash with your normal soap after using and rinsing off  the CHG Soap. ?               9.  Pat yourself dry with a clean towel. ?           10.  Wear clean pajamas. ?  11.  Place clean sheets on your bed the night of your first shower and do not  sleep with pets. ?Day of Surgery : ?Do not apply any lotions/deodorants the morning of surgery.  Please wear clean clothes to the hospital/surgery center. ? ?FAILURE TO FOLLOW THESE INSTRUCTIONS MAY RESULT IN THE CANCELLATION OF YOUR SURGERY ?PATIENT SIGNATURE_________________________________ ? ?NURSE SIGNATURE__________________________________ ? ?________________________________________________________________________  ? ? ?           ?

## 2021-09-08 NOTE — Patient Instructions (Signed)
 Instructions for surgery are below for your review. Please call the office at [phone] when you have decided on how you would like to proceed.   Dr. Pricilla Holm would like for you to have a doppler, which is like an ultrasound of your legs, to make sure there are no blood clots before surgery.   Preparing for your Surgery   Plan for surgery on September 19, 2021 with Dr. Eugene Garnet at [facility]. You will be scheduled for robotic assisted total laparoscopic hysterectomy (removal of the uterus and cervix), bilateral salpingo-oophorectomy (removal of both ovaries and fallopian tubes), sentinel lymph node biopsy, possible lymph node dissection, possible laparotomy (larger incision on your abdomen if needed), possible staging.    If you are doing well, we usually send patients home the same day of surgery and you would need someone to drive you home and to stay with you for the first 24 hours while the anesthesia is getting out of your system.   Pre-operative Testing -You will receive a phone call from presurgical testing at [facility] to arrange for a pre-operative appointment and lab work.   -Bring your insurance card, copy of an advanced directive if applicable, medication list   -At that visit, you will be asked to sign a consent for a possible blood transfusion in case a transfusion becomes necessary during surgery.  The need for a blood transfusion is rare but having consent is a necessary part of your care.      -You should not be taking blood thinners or aspirin at least ten days prior to surgery unless instructed by your surgeon.   -Do not take supplements such as fish oil (omega 3), red yeast rice, turmeric before your surgery. You want to avoid medications with aspirin in them including headache powders such as BC or Goody's), Excedrin migraine.   Day Before Surgery at Home -You will be asked to take in a light diet the day before surgery. You will be advised you can  have clear liquids up until 3 hours before your surgery.     Eat a light diet the day before surgery.  Examples including soups, broths, toast, yogurt, mashed potatoes.  AVOID GAS PRODUCING FOODS. Things to avoid include carbonated beverages (fizzy beverages, sodas), raw fruits and raw vegetables (uncooked), or beans.    If your bowels are filled with gas, your surgeon will have difficulty visualizing your pelvic organs which increases your surgical risks.   Your role in recovery Your role is to become active as soon as directed by your doctor, while still giving yourself time to heal.  Rest when you feel tired. You will be asked to do the following in order to speed your recovery:   - Cough and breathe deeply. This helps to clear and expand your lungs and can prevent pneumonia after surgery.  - STAY ACTIVE WHEN YOU GET HOME. Do mild physical activity. Walking or moving your legs help your circulation and body functions return to normal. Do not try to get up or walk alone the first time after surgery.   -If you develop swelling on one leg or the other, pain in the back of your leg, redness/warmth in one of your legs, please call the office or go to the Emergency Room to have a doppler to rule out a blood clot. For shortness of breath, chest pain-seek care in the Emergency Room as soon as possible. - Actively manage your pain. Managing your pain lets you  move in comfort. We will ask you to rate your pain on a scale of zero to 10. It is your responsibility to tell your doctor or nurse where and how much you hurt so your pain can be treated.   Special Considerations -If you are diabetic, you may be placed on insulin after surgery to have closer control over your blood sugars to promote healing and recovery.  This does not mean that you will be discharged on insulin.  If applicable, your oral antidiabetics will be resumed when you are tolerating a solid diet.   -Your final pathology results from surgery  should be available around one week after surgery and the results will be relayed to you when available.   -Dr. Antionette Char is the surgeon that assists your GYN Oncologist with surgery.  If you end up staying the night, the next day after your surgery you will either see Dr. Pricilla Holm or Dr. Antionette Char.   -FMLA forms can be faxed to [phone] and please allow 5-7 business days for completion.   Pain Management After Surgery -You will be prescribed your pain medication and bowel regimen medications before surgery so that you can have these available when you are discharged from the hospital. The pain medication is for use ONLY AFTER surgery and a new prescription will not be given.    -Make sure that you have Tylenol and Ibuprofen IF YOU ARE ABLE TO TAKE THESE MEDICATIONS at home to use on a regular basis after surgery for pain control. We recommend alternating the medications every hour to six hours since they work differently and are processed in the body differently for pain relief.   -Review the attached handout on narcotic use and their risks and side effects.    Bowel Regimen -You will be prescribed Sennakot-S to take nightly to prevent constipation especially if you are taking the narcotic pain medication intermittently.  It is important to prevent constipation and drink adequate amounts of liquids. You can stop taking this medication when you are not taking pain medication and you are back on your normal bowel routine.   Risks of Surgery Risks of surgery are low but include bleeding, infection, damage to surrounding structures, re-operation, blood clots, and very rarely death.     Blood Transfusion Information (For the consent to be signed before surgery)   We will be checking your blood type before surgery so in case of emergencies, we will know what type of blood you would need.                                             WHAT IS A BLOOD TRANSFUSION?   A transfusion  is the replacement of blood or some of its parts. Blood is made up of multiple cells which provide different functions. Red blood cells carry oxygen and are used for blood loss replacement. White blood cells fight against infection. Platelets control bleeding. Plasma helps clot blood. Other blood products are available for specialized needs, such as hemophilia or other clotting disorders. BEFORE THE TRANSFUSION  Who gives blood for transfusions?  You may be able to donate blood to be used at a later date on yourself (autologous donation). Relatives can be asked to donate blood. This is generally not any safer than if you have received blood from a stranger. The same precautions are taken to ensure  safety when a relative's blood is donated. Healthy volunteers who are fully evaluated to make sure their blood is safe. This is blood bank blood. Transfusion therapy is the safest it has ever been in the practice of medicine. Before blood is taken from a donor, a complete history is taken to make sure that person has no history of diseases nor engages in risky social behavior (examples are intravenous drug use or sexual activity with multiple partners). The donor's travel history is screened to minimize risk of transmitting infections, such as malaria. The donated blood is tested for signs of infectious diseases, such as HIV and hepatitis. The blood is then tested to be sure it is compatible with you in order to minimize the chance of a transfusion reaction. If you or a relative donates blood, this is often done in anticipation of surgery and is not appropriate for emergency situations. It takes many days to process the donated blood. RISKS AND COMPLICATIONS Although transfusion therapy is very safe and saves many lives, the main dangers of transfusion include:  Getting an infectious disease. Developing a transfusion reaction. This is an allergic reaction to something in the blood you were given. Every  precaution is taken to prevent this. The decision to have a blood transfusion has been considered carefully by your caregiver before blood is given. Blood is not given unless the benefits outweigh the risks.   AFTER SURGERY INSTRUCTIONS   Return to work: 4-6 weeks if applicable   Activity: 1. Be up and out of the bed during the day.  Take a nap if needed.  You may walk up steps but be careful and use the hand rail.  Stair climbing will tire you more than you think, you may need to stop part way and rest.    2. No lifting or straining for 6 weeks over 10 pounds. No pushing, pulling, straining for 6 weeks.   3. No driving for around 1 week(s).  Do not drive if you are taking narcotic pain medicine and make sure that your reaction time has returned.    4. You can shower as soon as the next day after surgery. Shower daily.  Use your regular soap and water (not directly on the incision) and pat your incision(s) dry afterwards; don't rub.  No tub baths or submerging your body in water until cleared by your surgeon. If you have the soap that was given to you by pre-surgical testing that was used before surgery, you do not need to use it afterwards because this can irritate your incisions.    5. No sexual activity and nothing in the vagina for 8 weeks.   6. You may experience a small amount of clear drainage from your incisions, which is normal.  If the drainage persists, increases, or changes color please call the office.   7. Do not use creams, lotions, or ointments such as neosporin on your incisions after surgery until advised by your surgeon because they can cause removal of the dermabond glue on your incisions.     8. You may experience vaginal spotting after surgery or around the 6-8 week mark from surgery when the stitches at the top of the vagina begin to dissolve.  The spotting is normal but if you experience heavy bleeding, call our office.   9. Take Tylenol or ibuprofen first for pain and  only use narcotic pain medication for severe pain not relieved by the Tylenol or Ibuprofen.  Monitor your Tylenol intake to  a max of 4,000 mg in a 24 hour period. You can alternate these medications after surgery.   Diet: 1. Low sodium Heart Healthy Diet is recommended but you are cleared to resume your normal (before surgery) diet after your procedure.   2. It is safe to use a laxative, such as Miralax or Colace, if you have difficulty moving your bowels. You have been prescribed Sennakot-S to take at bedtime every evening after surgery to keep bowel movements regular and to prevent constipation.     Wound Care: 1. Keep clean and dry.  Shower daily.   Reasons to call the Doctor: Fever - Oral temperature greater than 100.4 degrees Fahrenheit Foul-smelling vaginal discharge Difficulty urinating Nausea and vomiting Increased pain at the site of the incision that is unrelieved with pain medicine. Difficulty breathing with or without chest pain New calf pain especially if only on one side Sudden, continuing increased vaginal bleeding with or without clots.   Contacts: For questions or concerns you should contact:   Dr. Eugene Garnet at [phone]   Warner Mccreedy, NP at [phone]   After Hours: call [phone] and have the GYN Oncologist paged/contacted (after 5 pm or on the weekends).   Messages sent via mychart are for non-urgent matters and are not responded to after hours so for urgent needs, please call the after hours number.

## 2021-09-09 NOTE — Addendum Note (Signed)
 Encounter addended by: Patton Salles, MD on: 09/09/2021 12:52 PM  Actions taken: Clinical Note Signed

## 2021-09-09 NOTE — Progress Notes (Signed)
 High risk HPV testing and the pap were performed for cervical cancer screening.   Conley Simmonds, MD

## 2021-09-12 ENCOUNTER — Telehealth: Payer: Self-pay | Admitting: *Deleted

## 2021-09-12 ENCOUNTER — Encounter (HOSPITAL_COMMUNITY): Payer: Self-pay

## 2021-09-12 ENCOUNTER — Encounter (HOSPITAL_COMMUNITY)
Admission: RE | Admit: 2021-09-12 | Discharge: 2021-09-12 | Disposition: A | Discharge status: Home / Self Care | Payer: Medicare HMO | Source: Ambulatory Visit | Attending: Gynecologic Oncology | Admitting: Gynecologic Oncology

## 2021-09-12 ENCOUNTER — Other Ambulatory Visit: Payer: Self-pay

## 2021-09-12 VITALS — BP 133/64 | HR 77 | Temp 99.1°F | Resp 20 | Ht 66.0 in | Wt 147.8 lb

## 2021-09-12 DIAGNOSIS — Z01812 Encounter for preprocedural laboratory examination: Secondary | ICD-10-CM | POA: Diagnosis not present

## 2021-09-12 DIAGNOSIS — R7303 Prediabetes: Secondary | ICD-10-CM | POA: Insufficient documentation

## 2021-09-12 DIAGNOSIS — C55 Malignant neoplasm of uterus, part unspecified: Secondary | ICD-10-CM | POA: Diagnosis not present

## 2021-09-12 HISTORY — DX: Prediabetes: R73.03

## 2021-09-12 HISTORY — DX: Gastro-esophageal reflux disease without esophagitis: K21.9

## 2021-09-12 HISTORY — DX: Unspecified osteoarthritis, unspecified site: M19.90

## 2021-09-12 HISTORY — DX: Malignant neoplasm of endometrium: C54.1

## 2021-09-12 LAB — COMPREHENSIVE METABOLIC PANEL
ALT: 14 U/L (ref 0–44)
AST: 22 U/L (ref 15–41)
Albumin: 3.8 g/dL (ref 3.5–5.0)
Alkaline Phosphatase: 71 U/L (ref 38–126)
Anion gap: 5 (ref 5–15)
BUN: 16 mg/dL (ref 8–23)
CO2: 27 mmol/L (ref 22–32)
Calcium: 8.9 mg/dL (ref 8.9–10.3)
Chloride: 104 mmol/L (ref 98–111)
Creatinine, Ser: 1 mg/dL (ref 0.44–1.00)
GFR, Estimated: 55 mL/min — ABNORMAL LOW (ref >60)
Glucose, Bld: 105 mg/dL — ABNORMAL HIGH (ref 70–99)
Potassium: 4.3 mmol/L (ref 3.5–5.1)
Sodium: 136 mmol/L (ref 135–145)
Total Bilirubin: 0.3 mg/dL (ref 0.3–1.2)
Total Protein: 7.3 g/dL (ref 6.5–8.1)

## 2021-09-12 LAB — CBC
HCT: 32.8 % — ABNORMAL LOW (ref 36.0–46.0)
Hemoglobin: 10.3 g/dL — ABNORMAL LOW (ref 12.0–15.0)
MCH: 27.5 pg (ref 26.0–34.0)
MCHC: 31.4 g/dL (ref 30.0–36.0)
MCV: 87.7 fL (ref 80.0–100.0)
Platelets: 269 10*3/uL (ref 150–400)
RBC: 3.74 MIL/uL — ABNORMAL LOW (ref 3.87–5.11)
RDW: 13.8 % (ref 11.5–15.5)
WBC: 8.1 10*3/uL (ref 4.0–10.5)
nRBC: 0 % (ref 0.0–0.2)

## 2021-09-12 LAB — GLUCOSE, CAPILLARY: Glucose-Capillary: 108 mg/dL — ABNORMAL HIGH (ref 70–99)

## 2021-09-12 NOTE — Progress Notes (Addendum)
COVID swab appointment: N/A ? ?COVID Vaccine Completed:  Yes x1 ?Date COVID Vaccine completed: ?Has received booster: ?COVID vaccine manufacturer: Unsure ? ?Date of COVID positive in last 90 days:  No ? ?PCP - [name], MD ?Cardiologist - Shelva Majestic, MD ? ?Chest x-ray - 09-04-21 CT chest Epic ?EKG - 02-09-21 Epic ?Stress Test - N/A ?ECHO - N/A ?Cardiac Cath - N/A ?Pacemaker/ICD device last checked: ?Spinal Cord Stimulator: ? ?Bowel Prep - N/A ? ?Sleep Study - N/A ?CPAP -  ? ?Fasting Blood Sugar - N/A ?Checks Blood Sugar _____ times a day ? ?Blood Thinner Instructions: ?Aspirin Instructions:  ASA 81 mg.  Patient states she is to stop the day before  ?Last Dose: ? ?Activity level:   Can go up a flight of stairs and perform activities of daily living without stopping and without symptoms of chest pain or shortness of breath.  Able to exercise without symptoms ?  ?Anesthesia review:  Evaluated by cardiology for dyspnea and chest pain in 2017.  Patient denies any current symptoms of dyspnea or chest pain. ? ?Patient denies shortness of breath, fever, cough and chest pain at PAT appointment ? ?Patient verbalized understanding of instructions that were given to them at the PAT appointment. Patient was also instructed that they will need to review over the PAT instructions again at home before surgery.  ?

## 2021-09-12 NOTE — Patient Instructions (Addendum)
 DUE TO COVID-19 ONLY TWO VISITORS  (aged 86 and older)  IS ALLOWED TO COME WITH YOU AND STAY IN THE WAITING ROOM ONLY DURING PRE OP AND PROCEDURE.   **NO VISITORS ARE ALLOWED IN THE SHORT STAY AREA OR RECOVERY ROOM!!**   You are not required to quarantine, however you are required to wear a well-fitted mask when you are out and around people not in your household.  Hand Hygiene often Do NOT share personal items Notify your provider if you are in close contact with someone who has COVID or you develop fever 100.4 or greater, new onset of sneezing, cough, sore throat, shortness of breath or body aches.        Your procedure is scheduled on:  09-19-21   Report to [facility] Main Entrance    Report to admitting at 5:15 AM   Call this number if you have problems the morning of surgery [phone]   Follow a light diet day before surgery (avoid gas producing foods)   Do not eat food :After Midnight.   After Midnight you may have the following liquids until 4:30 AM DAY OF SURGERY  Water Black Coffee (sugar ok, NO MILK/CREAM OR CREAMERS)  Tea (sugar ok, NO MILK/CREAM OR CREAMERS) regular and decaf                             Plain Jell-O (NO RED)                                           Fruit ices (not with fruit pulp, NO RED)                                     Popsicles (NO RED)                                                                  Juice: apple, WHITE grape, WHITE cranberry Sports drinks like Gatorade (NO RED) Clear broth(vegetable,chicken,beef)               Drink 1 G2 AT 4:30 AM the morning of surgery.      The day of surgery:  Drink ONE (1) Pre-Surgery G2  the morning of surgery. Drink in one sitting. Do not sip.  This drink was given to you during your hospital  pre-op appointment visit. Nothing else to drink after completing the Pre-Surgery G2 .          If you have questions, please contact your surgeon's office.   FOLLOW ANY ADDITIONAL PRE OP  INSTRUCTIONS YOU RECEIVED FROM YOUR SURGEON'S OFFICE!!!     Oral Hygiene is also important to reduce your risk of infection.                                    Remember - BRUSH YOUR TEETH THE MORNING OF SURGERY WITH YOUR REGULAR TOOTHPASTE   Do NOT smoke after Midnight  Take these  medicines the morning of surgery with A SIP OF WATER:  Tylenol, Amlodipine, Atenolol, Synthroid                              You may not have any metal on your body including hair pins, jewelry, and body piercing             Do not wear make-up, lotions, powders, perfumes or deodorant  Do not wear nail polish including gel and S&S, artificial/acrylic nails, or any other type of covering on natural nails including finger and toenails. If you have artificial nails, gel coating, etc. that needs to be removed by a nail salon please have this removed prior to surgery or surgery may need to be canceled/ delayed if the surgeon/ anesthesia feels like they are unable to be safely monitored.   Do not shave  48 hours prior to surgery.              Do not bring valuables to the hospital. [place] IS NOT RESPONSIBLE   FOR VALUABLES.   Contacts, dentures or bridgework may not be worn into surgery.   Patients discharged on the day of surgery will not be allowed to drive home.  Someone NEEDS to stay with you for the first 24 hours after anesthesia.  Please read over the following fact sheets you were given: IF YOU HAVE QUESTIONS ABOUT YOUR PRE-OP INSTRUCTIONS PLEASE CALL [phone] [facility] - Preparing for Surgery Before surgery, you can play an important role.  Because skin is not sterile, your skin needs to be as free of germs as possible.  You can reduce the number of germs on your skin by washing with CHG (chlorahexidine gluconate) soap before surgery.  CHG is an antiseptic cleaner which kills germs and bonds with the skin to continue killing germs even after washing. Please DO NOT use if you have an allergy  to CHG or antibacterial soaps.  If your skin becomes reddened/irritated stop using the CHG and inform your nurse when you arrive at Short Stay. Do not shave (including legs and underarms) for at least 48 hours prior to the first CHG shower.  You may shave your face/neck.  Please follow these instructions carefully:  1.  Shower with CHG Soap the night before surgery and the  morning of surgery.  2.  If you choose to wash your hair, wash your hair first as usual with your normal  shampoo.  3.  After you shampoo, rinse your hair and body thoroughly to remove the shampoo.                             4.  Use CHG as you would any other liquid soap.  You can apply chg directly to the skin and wash.  Gently with a scrungie or clean washcloth.  5.  Apply the CHG Soap to your body ONLY FROM THE NECK DOWN.   Do   not use on face/ open                           Wound or open sores. Avoid contact with eyes, ears mouth and   genitals (private parts).                       Wash face,  Genitals (  private parts) with your normal soap.             6.  Wash thoroughly, paying special attention to the area where your    surgery  will be performed.  7.  Thoroughly rinse your body with warm water from the neck down.  8.  DO NOT shower/wash with your normal soap after using and rinsing off the CHG Soap.                9.  Pat yourself dry with a clean towel.            10.  Wear clean pajamas.            11.  Place clean sheets on your bed the night of your first shower and do not  sleep with pets. Day of Surgery : Do not apply any lotions/deodorants the morning of surgery.  Please wear clean clothes to the hospital/surgery center.  FAILURE TO FOLLOW THESE INSTRUCTIONS MAY RESULT IN THE CANCELLATION OF YOUR SURGERY  PATIENT SIGNATURE_________________________________  NURSE SIGNATURE__________________________________  ________________________________________________________________________    WHAT IS A BLOOD  TRANSFUSION? Blood Transfusion Information  A transfusion is the replacement of blood or some of its parts. Blood is made up of multiple cells which provide different functions. Red blood cells carry oxygen and are used for blood loss replacement. White blood cells fight against infection. Platelets control bleeding. Plasma helps clot blood. Other blood products are available for specialized needs, such as hemophilia or other clotting disorders. BEFORE THE TRANSFUSION  Who gives blood for transfusions?  Healthy volunteers who are fully evaluated to make sure their blood is safe. This is blood bank blood. Transfusion therapy is the safest it has ever been in the practice of medicine. Before blood is taken from a donor, a complete history is taken to make sure that person has no history of diseases nor engages in risky social behavior (examples are intravenous drug use or sexual activity with multiple partners). The donor's travel history is screened to minimize risk of transmitting infections, such as malaria. The donated blood is tested for signs of infectious diseases, such as HIV and hepatitis. The blood is then tested to be sure it is compatible with you in order to minimize the chance of a transfusion reaction. If you or a relative donates blood, this is often done in anticipation of surgery and is not appropriate for emergency situations. It takes many days to process the donated blood. RISKS AND COMPLICATIONS Although transfusion therapy is very safe and saves many lives, the main dangers of transfusion include:  Getting an infectious disease. Developing a transfusion reaction. This is an allergic reaction to something in the blood you were given. Every precaution is taken to prevent this. The decision to have a blood transfusion has been considered carefully by your caregiver before blood is given. Blood is not given unless the benefits outweigh the risks. AFTER THE TRANSFUSION Right after  receiving a blood transfusion, you will usually feel much better and more energetic. This is especially true if your red blood cells have gotten low (anemic). The transfusion raises the level of the red blood cells which carry oxygen, and this usually causes an energy increase. The nurse administering the transfusion will monitor you carefully for complications. HOME CARE INSTRUCTIONS  No special instructions are needed after a transfusion. You may find your energy is better. Speak with your caregiver about any limitations on activity for underlying diseases you may have.  SEEK MEDICAL CARE IF:  Your condition is not improving after your transfusion. You develop redness or irritation at the intravenous (IV) site. SEEK IMMEDIATE MEDICAL CARE IF:  Any of the following symptoms occur over the next 12 hours: Shaking chills. You have a temperature by mouth above 102 F (38.9 C), not controlled by medicine. Chest, back, or muscle pain. People around you feel you are not acting correctly or are confused. Shortness of breath or difficulty breathing. Dizziness and fainting. You get a rash or develop hives. You have a decrease in urine output. Your urine turns a dark color or changes to pink, red, or brown. Any of the following symptoms occur over the next 10 days: You have a temperature by mouth above 102 F (38.9 C), not controlled by medicine. Shortness of breath. Weakness after normal activity. The white part of the eye turns yellow (jaundice). You have a decrease in the amount of urine or are urinating less often. Your urine turns a dark color or changes to pink, red, or brown. Document Released: 05/25/2000 Document Revised: 08/20/2011 Document Reviewed: 01/12/2008 [facility] Patient Information 2014 [place], [place].  _______________________________________________________________________

## 2021-09-12 NOTE — Telephone Encounter (Signed)
Patient called and stated "I need to talk with [facility] regarding my surgery. I'm having second thoughts about this big surgery. The cancer is not in my cervix or other places. So not sure why they all need to come out." Explained that Melissa APP is out of the office and would call her back tomorrow ?

## 2021-09-12 NOTE — Telephone Encounter (Signed)
Per Dr Tucker fax office note and surgical optimization form to the patient's PCP  

## 2021-09-13 ENCOUNTER — Telehealth: Payer: Self-pay | Admitting: Gynecologic Oncology

## 2021-09-13 LAB — HEMOGLOBIN A1C
Hgb A1c MFr Bld: 5.9 % — ABNORMAL HIGH (ref 4.8–5.6)
Mean Plasma Glucose: 123 mg/dL

## 2021-09-13 NOTE — Telephone Encounter (Signed)
Returned call to patient. Patient with multiple questions related to her upcoming surgery including "why does her cervix and ovaries have to be removed?Mikayla Harrison She states she is overwhelmed with the diagnosis and plan but she "does not want to have cancer" and states she is "not an expert" so she will plan to proceed with surgery as scheduled. All questions answered. Patient verbalizing understanding. Advised her to call for any needs or further questions. ?

## 2021-09-14 NOTE — Telephone Encounter (Signed)
Received surgical optimization from the patient's PCP office, copy fax to pre admission dept and copy placed in chart  ?

## 2021-09-18 ENCOUNTER — Telehealth: Payer: Self-pay | Admitting: *Deleted

## 2021-09-18 ENCOUNTER — Encounter: Payer: Self-pay | Admitting: Nurse Practitioner

## 2021-09-18 ENCOUNTER — Ambulatory Visit (INDEPENDENT_AMBULATORY_CARE_PROVIDER_SITE_OTHER): Payer: Medicare HMO | Admitting: Nurse Practitioner

## 2021-09-18 VITALS — BP 108/62 | HR 79 | Temp 97.9°F | Resp 16 | Ht 62.0 in | Wt 150.4 lb

## 2021-09-18 DIAGNOSIS — R051 Acute cough: Secondary | ICD-10-CM

## 2021-09-18 DIAGNOSIS — R5383 Other fatigue: Secondary | ICD-10-CM | POA: Diagnosis not present

## 2021-09-18 DIAGNOSIS — R062 Wheezing: Secondary | ICD-10-CM

## 2021-09-18 DIAGNOSIS — J018 Other acute sinusitis: Secondary | ICD-10-CM | POA: Diagnosis not present

## 2021-09-18 MED ORDER — AZITHROMYCIN 250 MG PO TABS: ORAL_TABLET | ORAL | 1 refills | Status: DC

## 2021-09-18 MED ORDER — BENZONATATE 100 MG PO CAPS: 100.0000 mg | ORAL_CAPSULE | Freq: Three times a day (TID) | ORAL | 1 refills | Status: DC | PRN

## 2021-09-18 NOTE — Telephone Encounter (Signed)
Pt called this morning and stated that she feels like she has a sinus infection with coughing spasms. She is afebrile and COVID negative when she took a home test. She stated she would like to reschedule her surgery because she does not feel like its safe to go into surgery with the way she feels and the coughing would not be good for her incisions. Per Joylene John, NP and Dr.Tucker we've had pt's with sinus infections before go into surgery and they've done well. We'll assess her the morning of and see if she is well enough to proceed. Pt stated that she still does not feel comfortable with proceeding with surgery at this time and would like to reschedule the surgery. Per Dr.Tucker pt needs to see her PCP to get assessed. Pt stated that she has an appointment with her PCP at 11:30 am today. Informed pt that Joylene John, NP will reach out to pt regarding rescheduling with pt.  ?

## 2021-09-18 NOTE — Progress Notes (Signed)
Assessment and Plan: ? ?Mikayla Harrison was seen today for an episodic visit. ? ?Diagnoses and all orders for this visit: ? ?1. Acute non-recurrent sinusitis of other sinus/acute cough/acute wheezing ? ?Start Abx ?Start Benzonatate  ?Zyrtec sampeles provided ?Discussed Coricidin OTC PRN ?Stay well hydrated to keep mucus thin and productive. ? ?- azithromycin (ZITHROMAX) 250 MG tablet; Take 2 tablets (500 mg) on Day 1 followed by 1 tablet  (250 mg) daily until complete.  Dispense: 6 each; Refill: 1 ?- benzonatate (TESSALON PERLES) 100 MG capsule; Take 1 capsule (100 mg total) by mouth 3 (three) times daily as needed for cough.  Dispense: 30 capsule; Refill: 1 ? ?2. Other fatigue ? ?Rest. ? ?If s/s fail to improve within 24-48 contact office for further review and evaluation.  ? ?Further disposition pending results of labs. Discussed med's effects and SE's.   ?Over 30 minutes of exam, counseling, chart review, and critical decision making was performed.  ? ?Future Appointments  ?Date Time Provider Mikayla Harrison  ?09/26/2021  4:20 PM Mikayla Mosses, MD [facility] None  ?10/04/2021  2:30 PM Harrison, Mikayla Roger, NP [facility] None  ?10/12/2021  3:45 PM Mikayla Mosses, MD [facility] None  ?02/13/2022  3:00 PM Mikayla Pinto, MD [facility] None  ?06/19/2022 11:00 AM Mikayla Bernheim, NP [facility] None  ? ? ?------------------------------------------------------------------------------------------------------------------ ? ? ?HPI ?BP 108/62   Pulse 79   Temp 97.9 ?F (36.6 ?C)   Resp 16   Ht '5\' 2"'$  (1.575 m)   Wt 150 lb 6.4 oz (68.2 kg)   SpO2 96%   BMI 27.51 kg/m?  ? ?88 y.o.female presents with c/o head congestion, drainage, cough for 5 days. Reports that she is scheduled to have hysterectomy tomorrow with Dr. Berline Harrison and was advised to visit PCP for tmt p/t surgery.  Patient is concerned with  moving forward with surgery based on cough.  She feels as though coughing will inhibit her post-op healing and increase risk for  suture rupture.  She is not comfortable with moving forward with surgery until her current symptoms are well treated and resolved.  She has not tried any OTC medications for tmt.  She is trying to stay well hydrated.  She has lost some of her appetite. She is unable to sleep at night d/t cough. Reports PND and feeling as though her ears are clogged.  Denies N/V, fever, chills.   ? ? ?Past Medical History:  ?Diagnosis Date  ? Anemia   ? Arthritis   ? CKD (chronic kidney disease) stage 3, GFR 30-59 ml/min (HCC)   ? Colon polyp   ? Diverticulosis   ? Endometrial cancer ([place])   ? GERD (gastroesophageal reflux disease)   ? Hypertension   ? Hypothyroidism   ? Iron deficiency   ? Peripheral neuropathy   ? in bilateral legs  ? Plantar fasciitis   ? Pre-diabetes   ? Prediabetes   ? Pt denies  ? Small bowel obstruction ([place])   ?  ? ?Allergies  ?Allergen Reactions  ? Other Other (See Comments)  ? Sulfa Antibiotics Other (See Comments)  ? Codeine Nausea Only  ? Prednisone Nausea And Vomiting, Nausea Only and Other (See Comments)  ? Asa [Aspirin] Anxiety and Other (See Comments)  ?  Take '81mg'$  cannot take '325mg'$  gets nervous  ? ? ?Current Outpatient Medications on File Prior to Visit  ?Medication Sig  ? acetaminophen (TYLENOL) 325 MG tablet Take 325 mg by mouth every 6 (six) hours as  needed.  ? amLODipine (NORVASC) 10 MG tablet TAKE 1/2 TO 1 (ONE-HALF TO ONE) TABLET BY MOUTH ONCE DAILY FOR BLOOD PRESSURE (Patient taking differently: Take 2.5 mg by mouth daily as needed (elevated blood pressure.).)  ? Ascorbic Acid (VITAMIN C PO) Take 1,000 mg by mouth daily.  ? aspirin 81 MG EC tablet Take 81 mg by mouth in the morning.  ? atenolol (TENORMIN) 50 MG tablet Take  1 tablet  Daily  for BP                         /       Take 1 tablet by mouth once daily for blood pressure (Patient taking differently: Take 12.5 mg by mouth daily as needed (elevated blood pressure.).)  ? benazepril (LOTENSIN) 40 MG tablet Take 1 tablet by mouth once  daily for blood pressure  ? Cholecalciferol (VITAMIN D3) 125 MCG (5000 UT) TABS Take 5,000 Units by mouth daily.  ? clorazepate (TRANXENE) 7.5 MG tablet Take 1/2 - 1 tablet 2 - 3 x /day ONLY if needed for Anxiety Attack &  limit to 5 days /week to avoid Addiction & Dementia (Patient taking differently: Take 1.875 mg by mouth daily as needed for anxiety. Take 1/2 - 1 tablet 2 - 3 x /day ONLY if needed for Anxiety Attack &  limit to 5 days /week to avoid Addiction & Dementia)  ? clotrimazole (LOTRIMIN) 1 % cream Apply 1 application topically 2 (two) times daily.  ? Cyanocobalamin (VITAMIN B-12) 2500 MCG SUBL Take 2,500 mcg by mouth daily.  ? Magnesium 250 MG TABS Take 1 tablet by mouth daily.  ? meclizine (ANTIVERT) 25 MG tablet TAKE 1/2 TO 1 (ONE-HALF TO ONE) TABLET BY MOUTH THREE TIMES DAILY FOR  MOTION  SICKNESS  AND  DIZZINESS  ? SYNTHROID 150 MCG tablet Take  1 tablet  Daily  on an empty stomach with only water for 30 minutes & no Antacid meds, Calcium or Magnesium for 4 hours & avoid Biotin  ? zinc gluconate 50 MG tablet Take 50 mg by mouth daily.  ? ?No current facility-administered medications on file prior to visit.  ? ? ?ROS: all negative except what is noted in the HPI.  ? ?Physical Exam: ? ?BP 108/62   Pulse 79   Temp 97.9 ?F (36.6 ?C)   Resp 16   Ht '5\' 2"'$  (1.575 m)   Wt 150 lb 6.4 oz (68.2 kg)   SpO2 96%   BMI 27.51 kg/m?  ? ?General Appearance: NAD.  Awake, conversant and cooperative. ?Eyes: PERRLA, EOMs intact.  Sclera white.  Conjunctiva without erythema. ?Sinuses: Frontal and maxillary tenderness.  No nasal discharge. Nares patent.  ?ENT/Mouth: Ext aud canals clear.  Bilateral TMs w/DOL and without erythema or bulging. Hearing intact.  Posterior pharynx without swelling or exudate.  Tonsils without swelling or erythema.  ?Neck: Supple.  No masses, nodules or thyromegaly. ?Respiratory: Effort is regular with non-labored breathing. Breath sounds are equal bilaterally without rales, rhonchi,  wheezing or stridor.  ?Cardio: RRR with no MRGs. Brisk peripheral pulses without edema.  ?Abdomen: Active BS in all four quadrants.  Soft and non-tender without guarding, rebound tenderness, hernias or masses. ?Lymphatics: Non tender without lymphadenopathy.  ?Musculoskeletal: Full ROM, 5/5 strength, normal ambulation.  No clubbing or cyanosis. ?Skin: Appropriate color for ethnicity. Warm without rashes, lesions, ecchymosis, ulcers.  ?Neuro: CN II-XII grossly normal. Normal muscle tone without cerebellar symptoms and intact sensation.   ?  Psych: AO X 3,  appropriate mood and affect, insight and judgment.  ?  ? ?Darrol Jump, NP ?11:48 AM ?[facility] ? ?

## 2021-09-19 DIAGNOSIS — C55 Malignant neoplasm of uterus, part unspecified: Secondary | ICD-10-CM

## 2021-09-19 LAB — TYPE AND SCREEN
ABO/RH(D): A POS
Antibody Screen: NEGATIVE

## 2021-09-25 ENCOUNTER — Telehealth: Payer: Self-pay | Admitting: *Deleted

## 2021-09-25 NOTE — Telephone Encounter (Signed)
Spoke with pt this morning and offered her the surgery date of Oct 18, 2021. Pt said this date would work. Offered to call her and let her know if something becomes available next week but she stated that she has to coordinate with her daughter so the date of May 10th would be best. Informed her we are canceling her appointments for 4/18 and 5/4. She verbalized understanding.  ?

## 2021-09-26 ENCOUNTER — Inpatient Hospital Stay: Payer: Medicare HMO | Admitting: Gynecologic Oncology

## 2021-09-29 ENCOUNTER — Telehealth: Payer: Self-pay

## 2021-09-29 NOTE — Telephone Encounter (Signed)
Spoke with Ms. Mikayla Harrison this afternoon. Offered patient earlier surgical date of 10/04/21. Patient would prefer to keep her surgery as scheduled due to family arrangements.   ?

## 2021-09-29 NOTE — Telephone Encounter (Signed)
Attempted to contact patient to offer her earlier surgical date of 10/04/21. Unable to reach patient. Left message requesting return call.  ?

## 2021-10-03 NOTE — Progress Notes (Deleted)
3 MONTH FOLLOW UP   Assessment:     Essential hypertension - continue medications, DASH diet, exercise and monitor at home. Call if greater than 130/80.  -check BP in the morning and at 9 oclock - taking benazepril and bASA in the morning, take norvasc 1/2 at 7 without checking BP before that - if your BP is over 140/80 at 10 pm, you can take 1/2 of the atenolol  Hyperlipidemia At goal without medications at this time;  Continue low cholesterol diet and exercise.  Defer lipid panel as would not aggressively treat secondary to age  Abnormal glucose Discussed disease and risks Discussed diet/exercise, weight management  Defer A1C- check CMP for glucose  CKD Stage III (GFR 56 ml/min) Increase fluids, avoid NSAIDS, monitor sugars, will monitor - CMP/GFR  Hypothyroidism, unspecified hypothyroidism type -check TSH level, continue medications the same, reminded to take on an empty stomach 30-78mns before food.    Diverticulosis of intestine without bleeding, unspecified intestinal tract location Controlled, diet   Raynaud phenomenon Declines meds at this time  Overweight Long discussion about weight loss, diet, and exercise Recommended diet heavy in fruits and veggies and low in animal meats, cheeses, and dairy products, appropriate calorie intake Discussed appropriate weight for height Follow up at next visit   Edema of lower extremities Encourage compression socks and elevating feet above heart level as much as possible Limit salt intake and push fluids   Vitamin D deficiency Continue supplementation to maintain goal of 70-100 Taking Vitamin D 2,000 IU daily Check vitamin D level  Medication management Continued   Further disposition pending results if labs check today. Discussed med's effects and SE's.   Over 30 minutes of face to face interview, exam, counseling, chart review, and critical decision making was performed.    Future Appointments  Date Time  Provider DL[place] 10/04/2021  2:30 PM MMagda Bernheim NP [facility] None  10/10/2021  1:00 PM [facility] PAT 1 [facility] None  02/13/2022  3:00 PM MUnk Pinto MD [facility] None  06/19/2022 11:00 AM MMagda Bernheim NP [facility] None        Subjective:   Mikayla RIESEis a 88y.o. female who presents for Medicare Annual Wellness Visit and 3 month follow up on hypertension, prediabetes, hyperlipidemia, vitamin D def.   She has had ongoing ear pain at her jaw, causes dizziness, states if she opens and closes her mouth she can "pop" her ear and make it feel better/relieve the issues.    Plantar fascitis and she has bone spurs on the left foot.  She has been to Foot specialist, Dr WEarleen Newport She has used Gabapentin but the medication makes her sick and can not use. Dr LMardelle Mattehas put shots in her knees for arthritis  She has noted a rash under her breast for approximately 6 weeks, very itchy  Has tried hydrocortisone with no relief   She has had vaginal discharge that is blood tinged- was to be seen by gynecologist but left before her appointment. Needs to be referred  Continues to have swelling in her right lower leg. She has a history of CREST syndrome, may need to follow up with rheumatology.   She lives by herself, has a lot of anxiety at night, has clorazepate if needed, takes 1/2-1/4 tab occasionally.    BMI is There is no height or weight on file to calculate BMI., she has not been working on diet and exercise. Wt Readings from Last 3 Encounters:  09/18/21 150 lb 6.4 oz (68.2 kg)  09/12/21 147 lb 12.8 oz (67 kg)  09/07/21 153 lb 1.6 oz (69.4 kg)   Her blood pressure has been controlled at home, today their BP is  .  BP Readings from Last 3 Encounters:  09/18/21 108/62  09/12/21 133/64  09/07/21 139/70   She was switched from ACE to ARB due to cough however she has not switched and states cough got better with ABX/prednisone.  She checks her BP in the AM, at 5 pm occ will do 1/4  of atenolol or 1/2 of amlodipine, then she will check it at 8 and it will begin to accerate   She does not workout but stays active around the house. She denies chest pain, shortness of breath, dizziness.   She is not on cholesterol medication and denies myalgias. Her cholesterol is at goal. The cholesterol last visit was:   Lab Results  Component Value Date   CHOL 142 02/09/2021   HDL 40 (L) 02/09/2021   LDLCALC 83 02/09/2021   TRIG 91 02/09/2021   CHOLHDL 3.6 02/09/2021    She has CKD stage III due to HTN. Lab Results  Component Value Date   GFRNONAA 84 (L) 09/12/2021   She has been working on diet and exercise for prediabetes, and denies paresthesia of the feet, polydipsia, polyuria and visual disturbances. She is on bASA.  Last A1C in the office was:  Lab Results  Component Value Date   HGBA1C 5.9 (H) 09/12/2021   Patient is on Vitamin D supplement. Lab Results  Component Value Date   VD25OH 113 (H) 06/19/2021     She is on thyroid medication. Her medication was not changed last visit.   Lab Results  Component Value Date   TSH 0.83 06/19/2021   Patient was held overnight for observation in May 2018 with a (-) Head CT, MRI & carotid dopplers and she was discharged home on LD bASA for suspected possible TIA.  Medication Review Current Outpatient Medications on File Prior to Visit  Medication Sig   acetaminophen (TYLENOL) 325 MG tablet Take 325 mg by mouth every 6 (six) hours as needed.   amLODipine (NORVASC) 10 MG tablet TAKE 1/2 TO 1 (ONE-HALF TO ONE) TABLET BY MOUTH ONCE DAILY FOR BLOOD PRESSURE (Patient taking differently: Take 2.5 mg by mouth daily as needed (elevated blood pressure.).)   Ascorbic Acid (VITAMIN C PO) Take 1,000 mg by mouth daily.   aspirin 81 MG EC tablet Take 81 mg by mouth in the morning.   atenolol (TENORMIN) 50 MG tablet Take  1 tablet  Daily  for BP                         /       Take 1 tablet by mouth once daily for blood pressure (Patient  taking differently: Take 12.5 mg by mouth daily as needed (elevated blood pressure.).)   azithromycin (ZITHROMAX) 250 MG tablet Take 2 tablets (500 mg) on Day 1 followed by 1 tablet  (250 mg) daily until complete.   benazepril (LOTENSIN) 40 MG tablet Take 1 tablet by mouth once daily for blood pressure   benzonatate (TESSALON PERLES) 100 MG capsule Take 1 capsule (100 mg total) by mouth 3 (three) times daily as needed for cough.   Cholecalciferol (VITAMIN D3) 125 MCG (5000 UT) TABS Take 5,000 Units by mouth daily.   clorazepate (TRANXENE) 7.5 MG tablet Take 1/2 -  1 tablet 2 - 3 x /day ONLY if needed for Anxiety Attack &  limit to 5 days /week to avoid Addiction & Dementia (Patient taking differently: Take 1.875 mg by mouth daily as needed for anxiety. Take 1/2 - 1 tablet 2 - 3 x /day ONLY if needed for Anxiety Attack &  limit to 5 days /week to avoid Addiction & Dementia)   clotrimazole (LOTRIMIN) 1 % cream Apply 1 application topically 2 (two) times daily.   Cyanocobalamin (VITAMIN B-12) 2500 MCG SUBL Take 2,500 mcg by mouth daily.   Magnesium 250 MG TABS Take 1 tablet by mouth daily.   meclizine (ANTIVERT) 25 MG tablet TAKE 1/2 TO 1 (ONE-HALF TO ONE) TABLET BY MOUTH THREE TIMES DAILY FOR  MOTION  SICKNESS  AND  DIZZINESS   SYNTHROID 150 MCG tablet Take  1 tablet  Daily  on an empty stomach with only water for 30 minutes & no Antacid meds, Calcium or Magnesium for 4 hours & avoid Biotin   zinc gluconate 50 MG tablet Take 50 mg by mouth daily.   No current facility-administered medications on file prior to visit.    Current Problems (verified) Patient Active Problem List   Diagnosis Date Noted   Endometrial cancer ([place]) 09/08/2021   Edema of left lower extremity 09/08/2021   [place] pain 08/04/2020   Enrolled in chronic care management 11/14/2018   Normocytic anemia 06/10/2018   CREST syndrome ([place]) 02/12/2018   Abnormal glucose 10/10/2017   FHx: heart disease 10/10/2017   History of TIA  (transient ischemic attack) 11/06/2016   Raynaud phenomenon 08/01/2015   Overweight (BMI 25.0-29.9) 03/22/2015   Unifocal PVCs 11/02/2014   CKD (chronic kidney disease) stage 3, GFR 30-59 ml/min (HCC) 03/16/2014   Hyperlipidemia, mixed 10/05/2013   Screening for colorectal cancer 10/05/2013   Vitamin D deficiency 10/05/2013   Prediabetes    Diverticulosis    Essential hypertension 01/14/2012   Hypothyroidism 01/14/2012    Screening Tests Immunization History  Administered Date(s) Administered   DT (Pediatric) 03/22/2015   Influenza Split 03/04/2013   Influenza, High Dose Seasonal PF 03/16/2014, 03/22/2015, 02/15/2016, 04/23/2017, 04/30/2018, 04/08/2019, 06/19/2021   Pneumococcal Polysaccharide-23 03/16/2014   Td 10/10/2017    Names of Other Physician/Practitioners you currently use: 1. [facility]- here for primary care 2. Dr. Delman Cheadle, eye doctor, 2021 3. , dentist, no- has dentures  Patient Care Team: [name], MD as PCP - General (Internal Medicine)  Allergies Allergies  Allergen Reactions   Other Other (See Comments)   Sulfa Antibiotics Other (See Comments)   Codeine Nausea Only   Prednisone Nausea And Vomiting, Nausea Only and Other (See Comments)   Asa [Aspirin] Anxiety and Other (See Comments)    Take '81mg'$  cannot take '325mg'$  gets nervous    SURGICAL HISTORY She  has a past surgical history that includes Cataract extraction; Vericose vein (1965); [place]; and Eye surgery. FAMILY HISTORY Her family history includes Colon polyps in her sister; Diabetes in her sister; Heart disease in her brother, father, and mother; Hypertension in her son; Kidney cancer in her nephew; Stroke in her brother, mother, and sister. SOCIAL HISTORY She  reports that she quit smoking about 56 years ago. Her smoking use included cigarettes. She has a 15.00 pack-year smoking history. She has never used smokeless tobacco. She reports that she  does not drink alcohol and does not use drugs.    Objective:   There were no vitals taken for this  visit. There is no height or weight on file to calculate BMI.  General Appearance: Well nourished, in no apparent distress. Eyes: PERRLA, EOMs, conjunctiva no swelling or erythema Sinuses: No Frontal/maxillary tenderness ENT/Mouth: Ext aud canals clear, Tms fluid Right ear, no drainage or erythema. No erythema, swelling, or exudate on post pharynx.  Tonsils not swollen or erythematous. Hearing normal.  Neck: Supple, thyroid normal.  Respiratory: Respiratory effort normal, BS equal bilaterally without rales, rhonchi, wheezing or stridor.  Cardio: RRR with no MRGs. Brisk peripheral pulses.2+ pitting edema of LLE, 1+ nonpitting edema RLE Abdomen: Soft, + BS.  Non tender, no guarding, rebound, hernias, masses. Lymphatics: Non tender without lymphadenopathy.  Musculoskeletal: Full ROM, 5/5 strength, Normal gait Skin: Warm, dry . Red irregular border rash under breasts bilaterally  Neuro: Cranial nerves intact. No cerebellar symptoms.  Psych: Awake and oriented X 3, normal affect, Insight and Judgment appropriate.     [name] ANP-C  [facility]  10/03/2021

## 2021-10-04 ENCOUNTER — Ambulatory Visit: Payer: Medicare PPO | Admitting: Nurse Practitioner

## 2021-10-09 NOTE — Progress Notes (Addendum)
DUE TO COVID-19 ONLY ONE VISITOR IS ALLOWED TO COME WITH YOU AND STAY IN THE WAITING ROOM ONLY DURING PRE OP AND PROCEDURE DAY OF SURGERY.  2 VISITOR  MAY VISIT WITH YOU AFTER SURGERY IN YOUR PRIVATE ROOM DURING VISITING HOURS ONLY! ?YOU MAY HAVE ONE PERSON SPEND THE NITE WITH YOU IN YOUR ROOM AFTER SURGERY.   ? ? ? ? Your procedure is scheduled on:  ?       10/18/2021  ? Report to [facility] Main  Entrance ? ? Report to admitting at     1215 pm             ?DO NOT BRING INSURANCE CARD, PICTURE ID OR WALLET DAY OF SURGERY.  ?  ? ? Call this number if you have problems the morning of surgery [phone]  ?EAt a light diet the day before surgery.  Avoid gas producing foods.   ? REMEMBER: NO  SOLID FOODS , CANDY, GUM OR MINTS AFTER MIDNITE THE NITE BEFORE SURGERY .       Marland Kitchen CLEAR LIQUIDS UNTIL     1130am            DAY OF SURGERY.      PLEASE FINISH ENSURE DRINK PER SURGEON ORDER  WHICH NEEDS TO BE COMPLETED AT    1130am        MORNING OF SURGERY.   ? ? ? ? ?CLEAR LIQUID DIET ? ? ?Foods Allowed      ?WATER ?BLACK COFFEE ( SUGAR OK, NO MILK, CREAM OR CREAMER) REGULAR AND DECAF  ?TEA ( SUGAR OK NO MILK, CREAM, OR CREAMER) REGULAR AND DECAF  ?PLAIN JELLO ( NO RED)  ?FRUIT ICES ( NO RED, NO FRUIT PULP)  ?POPSICLES ( NO RED)  ?JUICE- APPLE, WHITE GRAPE AND WHITE CRANBERRY  ?SPORT DRINK LIKE GATORADE ( NO RED)  ?CLEAR BROTH ( VEGETABLE , CHICKEN OR BEEF)                                                               ? ?    ? ?BRUSH YOUR TEETH MORNING OF SURGERY AND RINSE YOUR MOUTH OUT, NO CHEWING GUM CANDY OR MINTS. ?  ? ? Take these medicines the morning of surgery with A SIP OF WATER:  amlodipine if needed, atenolol if needed , synthroid  ? ? ?DO NOT TAKE ANY DIABETIC MEDICATIONS DAY OF YOUR SURGERY ?                  ?            You may not have any metal on your body including hair pins and  ?            piercings  Do not wear jewelry, make-up, lotions, powders or perfumes, deodorant ?            Do not wear  nail polish on your fingernails.   ?           IF YOU ARE A FEMALE AND WANT TO SHAVE UNDER ARMS OR LEGS PRIOR TO SURGERY YOU MUST DO SO AT LEAST 48 HOURS PRIOR TO SURGERY.  ?            Men may shave face and neck. ? ?  Do not bring valuables to the hospital. [facility] NOT ?            RESPONSIBLE   FOR VALUABLES. ? Contacts, dentures or bridgework may not be worn into surgery. ? Leave suitcase in the car. After surgery it may be brought to your room. ? ?  ? Patients discharged the day of surgery will not be allowed to drive home. IF YOU ARE HAVING SURGERY AND GOING HOME THE SAME DAY, YOU MUST HAVE AN ADULT TO DRIVE YOU HOME AND BE WITH YOU FOR 24 HOURS. YOU MAY GO HOME BY TAXI OR UBER OR ORTHERWISE, BUT AN ADULT MUST ACCOMPANY YOU HOME AND STAY WITH YOU FOR 24 HOURS. ?  ? ?            Please read over the following fact sheets you were given: ?_____________________________________________________________________ ? ?[place] - Preparing for Surgery ?Before surgery, you can play an important role.  Because skin is not sterile, your skin needs to be as free of germs as possible.  You can reduce the number of germs on your skin by washing with CHG (chlorahexidine gluconate) soap before surgery.  CHG is an antiseptic cleaner which kills germs and bonds with the skin to continue killing germs even after washing. ?Please DO NOT use if you have an allergy to CHG or antibacterial soaps.  If your skin becomes reddened/irritated stop using the CHG and inform your nurse when you arrive at Short Stay. ?Do not shave (including legs and underarms) for at least 48 hours prior to the first CHG shower.  You may shave your face/neck. ?Please follow these instructions carefully: ? 1.  Shower with CHG Soap the night before surgery and the  morning of Surgery. ? 2.  If you choose to wash your hair, wash your hair first as usual with your  normal  shampoo. ? 3.  After you shampoo, rinse your hair and body thoroughly to remove the   shampoo.                           4.  Use CHG as you would any other liquid soap.  You can apply chg directly  to the skin and wash  ?                     Gently with a scrungie or clean washcloth. ? 5.  Apply the CHG Soap to your body ONLY FROM THE NECK DOWN.   Do not use on face/ open      ?                     Wound or open sores. Avoid contact with eyes, ears mouth and genitals (private parts).  ?                     Production manager,  Genitals (private parts) with your normal soap. ?            6.  Wash thoroughly, paying special attention to the area where your surgery  will be performed. ? 7.  Thoroughly rinse your body with warm water from the neck down. ? 8.  DO NOT shower/wash with your normal soap after using and rinsing off  the CHG Soap. ?               9.  Pat yourself dry with a clean towel. ?  10.  Wear clean pajamas. ?           11.  Place clean sheets on your bed the night of your first shower and do not  sleep with pets. ?Day of Surgery : ?Do not apply any lotions/deodorants the morning of surgery.  Please wear clean clothes to the hospital/surgery center. ? ?FAILURE TO FOLLOW THESE INSTRUCTIONS MAY RESULT IN THE CANCELLATION OF YOUR SURGERY ?PATIENT SIGNATURE_________________________________ ? ?NURSE SIGNATURE__________________________________ ? ?________________________________________________________________________  ? ? ?           ?

## 2021-10-09 NOTE — Progress Notes (Addendum)
Anesthesia Review: ? ?PCP: Darrol Jump- [facility] 09/18/21  ?Gwyndolyn Saxon [facility]  ?Cardiologist : DR Corky Downs [facility] 2017  ?Chest x-ray : ?CT Chest- 09/06/21  ?EKG : 02/09/21  ?Echo : ?Stress test: ?Cardiac Cath :  ?Activity level: can do a flight of stairs without difficulty  ?Sleep Study/ CPAP : none  ?Fasting Blood Sugar :      / Checks Blood Sugar -- times a day:   ?Blood Thinner/ Instructions /Last Dose: ?ASA / Instructions/ Last Dose :   ?81 mg Aspirin  ?Prediabetes- does not check glucose at home  ?Hgba1c- 09/12/21- 5.9  ?Surgery cancelled 09/2021 due to sinus infections.  Pt had followuop on 09/18/21( see above).   ?CBC done 10/10/21 rotued to Melissa Cross,Np and Dr Jeral Pinch.  ?

## 2021-10-10 ENCOUNTER — Encounter (HOSPITAL_COMMUNITY)
Admission: RE | Admit: 2021-10-10 | Discharge: 2021-10-10 | Disposition: A | Discharge status: Home / Self Care | Payer: Medicare HMO | Source: Ambulatory Visit | Attending: Gynecologic Oncology | Admitting: Gynecologic Oncology

## 2021-10-10 ENCOUNTER — Other Ambulatory Visit: Payer: Self-pay

## 2021-10-10 ENCOUNTER — Encounter (HOSPITAL_COMMUNITY): Payer: Self-pay

## 2021-10-10 VITALS — BP 141/80 | HR 67 | Temp 98.5°F | Resp 16 | Ht 66.0 in | Wt 149.0 lb

## 2021-10-10 DIAGNOSIS — Z01812 Encounter for preprocedural laboratory examination: Secondary | ICD-10-CM | POA: Insufficient documentation

## 2021-10-10 DIAGNOSIS — I10 Essential (primary) hypertension: Secondary | ICD-10-CM | POA: Insufficient documentation

## 2021-10-10 DIAGNOSIS — R7303 Prediabetes: Secondary | ICD-10-CM | POA: Insufficient documentation

## 2021-10-10 HISTORY — DX: Anxiety disorder, unspecified: F41.9

## 2021-10-10 LAB — CBC
HCT: 32.7 % — ABNORMAL LOW (ref 36.0–46.0)
Hemoglobin: 10.3 g/dL — ABNORMAL LOW (ref 12.0–15.0)
MCH: 27.5 pg (ref 26.0–34.0)
MCHC: 31.5 g/dL (ref 30.0–36.0)
MCV: 87.4 fL (ref 80.0–100.0)
Platelets: 263 10*3/uL (ref 150–400)
RBC: 3.74 MIL/uL — ABNORMAL LOW (ref 3.87–5.11)
RDW: 14.1 % (ref 11.5–15.5)
WBC: 7.2 10*3/uL (ref 4.0–10.5)
nRBC: 0 % (ref 0.0–0.2)

## 2021-10-10 LAB — BASIC METABOLIC PANEL
Anion gap: 6 (ref 5–15)
BUN: 16 mg/dL (ref 8–23)
CO2: 27 mmol/L (ref 22–32)
Calcium: 9.2 mg/dL (ref 8.9–10.3)
Chloride: 105 mmol/L (ref 98–111)
Creatinine, Ser: 1 mg/dL (ref 0.44–1.00)
GFR, Estimated: 55 mL/min — ABNORMAL LOW (ref >60)
Glucose, Bld: 98 mg/dL (ref 70–99)
Potassium: 4 mmol/L (ref 3.5–5.1)
Sodium: 138 mmol/L (ref 135–145)

## 2021-10-10 LAB — GLUCOSE, CAPILLARY: Glucose-Capillary: 88 mg/dL (ref 70–99)

## 2021-10-12 ENCOUNTER — Encounter: Payer: Medicare HMO | Admitting: Gynecologic Oncology

## 2021-10-16 ENCOUNTER — Other Ambulatory Visit: Payer: Self-pay | Admitting: Adult Health

## 2021-10-17 ENCOUNTER — Other Ambulatory Visit: Payer: Self-pay | Admitting: Gynecologic Oncology

## 2021-10-17 ENCOUNTER — Telehealth: Payer: Self-pay

## 2021-10-17 DIAGNOSIS — C55 Malignant neoplasm of uterus, part unspecified: Secondary | ICD-10-CM

## 2021-10-17 MED ORDER — TRAMADOL HCL 50 MG PO TABS: 50.0000 mg | ORAL_TABLET | Freq: Four times a day (QID) | ORAL | 0 refills | Status: DC | PRN

## 2021-10-17 MED ORDER — SENNOSIDES-DOCUSATE SODIUM 8.6-50 MG PO TABS: 2.0000 | ORAL_TABLET | Freq: Every day | ORAL | 0 refills | Status: DC

## 2021-10-17 NOTE — Telephone Encounter (Signed)
Telephone call to check on pre-operative status instructions. Reinforced nothing to eat after midnight. Per pt, surgery center told her surgery at 2:30p.Clear liquids until 11:30. Arrive at 12:30p. Instructed to call for any needs ?

## 2021-10-17 NOTE — Progress Notes (Unsigned)
Post-op meds sent in pre-operatively.  °

## 2021-10-17 NOTE — Anesthesia Preprocedure Evaluation (Addendum)
Anesthesia Evaluation  ?Patient identified by MRN, date of birth, ID band ?Patient awake ? ? ? ?Reviewed: ?Allergy & Precautions, NPO status , Patient's Chart, lab work & pertinent test results ? ?Airway ?Mallampati: III ? ?TM Distance: >3 FB ?Neck ROM: Full ? ? ? Dental ?no notable dental hx. ?(+) Edentulous Lower, Missing, Caps, Dental Advisory Given ?  ?Pulmonary ?former smoker,  ?  ?Pulmonary exam normal ?breath sounds clear to auscultation ? ? ? ? ? ? Cardiovascular ?hypertension, Pt. on medications ?Normal cardiovascular exam ?Rhythm:Regular Rate:Normal ? ? ?  ?Neuro/Psych ?Anxiety  Neuromuscular disease   ? GI/Hepatic ?Neg liver ROS,   ?Endo/Other  ?Hypothyroidism  ? Renal/GU ?Lab Results ?     Component                Value               Date                 ?     CREATININE               1.00                10/10/2021           ?     BUN                      16                  10/10/2021           ?     NA                       138                 10/10/2021           ?     K                        4.0                 10/10/2021           ?     CL                       105                 10/10/2021           ?     CO2                      27                  10/10/2021           ?  ? ?  ?Musculoskeletal ? ?(+) Arthritis ,  ? Abdominal ?  ?Peds ? Hematology ? ?(+) Blood dyscrasia, anemia , Lab Results ?     Component                Value               Date                 ?     WBC  7.2                 10/10/2021           ?     HGB                      10.3 (L)            10/10/2021           ?     HCT                      32.7 (L)            10/10/2021           ?     MCV                      87.4                10/10/2021           ?     PLT                      263                 10/10/2021           ?   ?Anesthesia Other Findings ?All: codeine sulfa ASA ? Reproductive/Obstetrics ? ?  ? ? ? ? ? ? ? ? ? ? ? ? ? ?  ?  ? ? ? ? ? ? ? ?Anesthesia  Physical ?Anesthesia Plan ? ?ASA: 3 ? ?Anesthesia Plan: General  ? ?Post-op Pain Management: Lidocaine infusion*  ? ?Induction: Intravenous ? ?PONV Risk Score and Plan: 4 or greater and Treatment may vary due to age or medical condition and Ondansetron ? ?Airway Management Planned: Oral ETT ? ?Additional Equipment:  ? ?Intra-op Plan:  ? ?Post-operative Plan: Extubation in OR ? ?Informed Consent: I have reviewed the patients History and Physical, chart, labs and discussed the procedure including the risks, benefits and alternatives for the proposed anesthesia with the patient or authorized representative who has indicated his/her understanding and acceptance.  ? ? ? ?Dental advisory given ? ?Plan Discussed with: CRNA and Anesthesiologist ? ?Anesthesia Plan Comments:   ? ? ? ? ? ?Anesthesia Quick Evaluation ? ?

## 2021-10-18 ENCOUNTER — Other Ambulatory Visit: Payer: Self-pay

## 2021-10-18 ENCOUNTER — Ambulatory Visit (HOSPITAL_COMMUNITY)
Admission: RE | Admit: 2021-10-18 | Discharge: 2021-10-19 | Disposition: A | Discharge status: Home / Self Care | Payer: Medicare HMO | Source: Ambulatory Visit | Attending: Gynecologic Oncology | Admitting: Gynecologic Oncology

## 2021-10-18 ENCOUNTER — Encounter (HOSPITAL_COMMUNITY)
Admission: RE | Disposition: A | Discharge status: Home / Self Care | Payer: Self-pay | Source: Ambulatory Visit | Attending: Gynecologic Oncology

## 2021-10-18 ENCOUNTER — Ambulatory Visit (HOSPITAL_COMMUNITY): Payer: Medicare HMO | Admitting: Physician Assistant

## 2021-10-18 ENCOUNTER — Ambulatory Visit (HOSPITAL_BASED_OUTPATIENT_CLINIC_OR_DEPARTMENT_OTHER): Payer: Medicare HMO | Admitting: Certified Registered Nurse Anesthetist

## 2021-10-18 ENCOUNTER — Encounter (HOSPITAL_COMMUNITY): Payer: Self-pay | Admitting: Gynecologic Oncology

## 2021-10-18 DIAGNOSIS — D630 Anemia in neoplastic disease: Secondary | ICD-10-CM | POA: Diagnosis not present

## 2021-10-18 DIAGNOSIS — C541 Malignant neoplasm of endometrium: Secondary | ICD-10-CM | POA: Diagnosis present

## 2021-10-18 DIAGNOSIS — Z87891 Personal history of nicotine dependence: Secondary | ICD-10-CM | POA: Insufficient documentation

## 2021-10-18 DIAGNOSIS — I129 Hypertensive chronic kidney disease with stage 1 through stage 4 chronic kidney disease, or unspecified chronic kidney disease: Secondary | ICD-10-CM | POA: Diagnosis not present

## 2021-10-18 DIAGNOSIS — N183 Chronic kidney disease, stage 3 unspecified: Secondary | ICD-10-CM | POA: Diagnosis not present

## 2021-10-18 DIAGNOSIS — E119 Type 2 diabetes mellitus without complications: Secondary | ICD-10-CM | POA: Insufficient documentation

## 2021-10-18 DIAGNOSIS — I10 Essential (primary) hypertension: Secondary | ICD-10-CM

## 2021-10-18 DIAGNOSIS — C55 Malignant neoplasm of uterus, part unspecified: Secondary | ICD-10-CM

## 2021-10-18 DIAGNOSIS — M25519 Pain in unspecified shoulder: Secondary | ICD-10-CM

## 2021-10-18 DIAGNOSIS — M1990 Unspecified osteoarthritis, unspecified site: Secondary | ICD-10-CM | POA: Diagnosis not present

## 2021-10-18 HISTORY — PX: CYSTOSCOPY: SHX5120

## 2021-10-18 HISTORY — PX: SENTINEL NODE BIOPSY: SHX6608

## 2021-10-18 HISTORY — PX: ROBOTIC ASSISTED TOTAL HYSTERECTOMY WITH BILATERAL SALPINGO OOPHERECTOMY: SHX6086

## 2021-10-18 LAB — GLUCOSE, CAPILLARY: Glucose-Capillary: 130 mg/dL — ABNORMAL HIGH (ref 70–99)

## 2021-10-18 LAB — TYPE AND SCREEN
ABO/RH(D): A POS
Antibody Screen: NEGATIVE

## 2021-10-18 SURGERY — HYSTERECTOMY, TOTAL, ROBOT-ASSISTED, LAPAROSCOPIC, WITH BILATERAL SALPINGO-OOPHORECTOMY
Anesthesia: General

## 2021-10-18 MED ORDER — ONDANSETRON HCL 4 MG PO TABS: 4.0000 mg | ORAL_TABLET | Freq: Four times a day (QID) | ORAL | Status: DC | PRN

## 2021-10-18 MED ORDER — FENTANYL CITRATE PF 50 MCG/ML IJ SOSY
PREFILLED_SYRINGE | INTRAMUSCULAR | Status: AC
  Filled 2021-10-18: qty 1

## 2021-10-18 MED ORDER — TRAMADOL HCL 50 MG PO TABS: 50.0000 mg | ORAL_TABLET | Freq: Four times a day (QID) | ORAL | Status: DC | PRN

## 2021-10-18 MED ORDER — CHLORHEXIDINE GLUCONATE 0.12 % MT SOLN: 15.0000 mL | Freq: Once | OROMUCOSAL | Status: DC

## 2021-10-18 MED ORDER — LACTATED RINGERS IV SOLN: INTRAVENOUS | Status: DC

## 2021-10-18 MED ORDER — ACETAMINOPHEN 500 MG PO TABS: 1000.0000 mg | ORAL_TABLET | ORAL | Status: DC

## 2021-10-18 MED ORDER — LIDOCAINE 2% (20 MG/ML) 5 ML SYRINGE
INTRAMUSCULAR | Status: DC | PRN
  Administered 2021-10-18: 1.5 mg/kg/h via INTRAVENOUS

## 2021-10-18 MED ORDER — ACETAMINOPHEN 500 MG PO TABS
1000.0000 mg | ORAL_TABLET | ORAL | Status: AC
  Administered 2021-10-18: 500 mg via ORAL
  Filled 2021-10-18: qty 2

## 2021-10-18 MED ORDER — FENTANYL CITRATE (PF) 100 MCG/2ML IJ SOLN
INTRAMUSCULAR | Status: AC
Start: 2021-10-18 — End: ?
  Filled 2021-10-18: qty 2

## 2021-10-18 MED ORDER — SENNOSIDES-DOCUSATE SODIUM 8.6-50 MG PO TABS: 2.0000 | ORAL_TABLET | Freq: Every day | ORAL | Status: DC

## 2021-10-18 MED ORDER — CLORAZEPATE DIPOTASSIUM 3.75 MG PO TABS: 1.8750 mg | ORAL_TABLET | Freq: Every day | ORAL | Status: DC | PRN

## 2021-10-18 MED ORDER — OXYCODONE HCL 5 MG PO TABS: 5.0000 mg | ORAL_TABLET | ORAL | Status: DC | PRN

## 2021-10-18 MED ORDER — PROPOFOL 10 MG/ML IV BOLUS
INTRAVENOUS | Status: DC | PRN
  Administered 2021-10-18: 60 mg via INTRAVENOUS

## 2021-10-18 MED ORDER — CEFAZOLIN SODIUM-DEXTROSE 2-4 GM/100ML-% IV SOLN
2.0000 g | INTRAVENOUS | Status: AC
  Administered 2021-10-18: 2 g via INTRAVENOUS
  Filled 2021-10-18: qty 100

## 2021-10-18 MED ORDER — ONDANSETRON HCL 4 MG/2ML IJ SOLN: 4.0000 mg | Freq: Once | INTRAMUSCULAR | Status: DC | PRN

## 2021-10-18 MED ORDER — ENOXAPARIN SODIUM 40 MG/0.4ML IJ SOSY: 40.0000 mg | PREFILLED_SYRINGE | INTRAMUSCULAR | Status: DC

## 2021-10-18 MED ORDER — ONDANSETRON HCL 4 MG/2ML IJ SOLN: 4.0000 mg | Freq: Four times a day (QID) | INTRAMUSCULAR | Status: DC | PRN

## 2021-10-18 MED ORDER — ACETAMINOPHEN 10 MG/ML IV SOLN
1000.0000 mg | Freq: Once | INTRAVENOUS | Status: DC | PRN
  Administered 2021-10-18: 1000 mg via INTRAVENOUS

## 2021-10-18 MED ORDER — ROCURONIUM BROMIDE 10 MG/ML (PF) SYRINGE
PREFILLED_SYRINGE | INTRAVENOUS | Status: AC
  Filled 2021-10-18: qty 10

## 2021-10-18 MED ORDER — ACETAMINOPHEN 10 MG/ML IV SOLN
INTRAVENOUS | Status: AC
  Filled 2021-10-18: qty 100

## 2021-10-18 MED ORDER — BUPIVACAINE HCL 0.25 % IJ SOLN
INTRAMUSCULAR | Status: DC | PRN
  Administered 2021-10-18: 30 mL

## 2021-10-18 MED ORDER — DEXAMETHASONE SODIUM PHOSPHATE 10 MG/ML IJ SOLN
INTRAMUSCULAR | Status: AC
  Filled 2021-10-18: qty 1

## 2021-10-18 MED ORDER — SUGAMMADEX SODIUM 200 MG/2ML IV SOLN
INTRAVENOUS | Status: DC | PRN
  Administered 2021-10-18: 135.2 mg via INTRAVENOUS

## 2021-10-18 MED ORDER — HEPARIN SODIUM (PORCINE) 5000 UNIT/ML IJ SOLN
5000.0000 [IU] | INTRAMUSCULAR | Status: AC
  Administered 2021-10-18: 5000 [IU] via SUBCUTANEOUS
  Filled 2021-10-18: qty 1

## 2021-10-18 MED ORDER — LEVOTHYROXINE SODIUM 75 MCG PO TABS
150.0000 ug | ORAL_TABLET | Freq: Every day | ORAL | Status: DC
  Administered 2021-10-19: 150 ug via ORAL
  Filled 2021-10-18: qty 2

## 2021-10-18 MED ORDER — BUPIVACAINE HCL 0.25 % IJ SOLN
INTRAMUSCULAR | Status: AC
  Filled 2021-10-18: qty 1

## 2021-10-18 MED ORDER — ACETAMINOPHEN 500 MG PO TABS
500.0000 mg | ORAL_TABLET | Freq: Four times a day (QID) | ORAL | Status: DC
  Administered 2021-10-19: 500 mg via ORAL
  Filled 2021-10-18: qty 1

## 2021-10-18 MED ORDER — ONDANSETRON HCL 4 MG/2ML IJ SOLN
INTRAMUSCULAR | Status: AC
  Filled 2021-10-18: qty 2

## 2021-10-18 MED ORDER — LIDOCAINE 2% (20 MG/ML) 5 ML SYRINGE
INTRAMUSCULAR | Status: DC | PRN
  Administered 2021-10-18: 60 mg via INTRAVENOUS

## 2021-10-18 MED ORDER — STERILE WATER FOR INJECTION IJ SOLN
INTRAMUSCULAR | Status: DC | PRN
  Administered 2021-10-18: 4 mL via INTRAMUSCULAR

## 2021-10-18 MED ORDER — LACTATED RINGERS IR SOLN
Status: DC | PRN
  Administered 2021-10-18: 1000 mL

## 2021-10-18 MED ORDER — DEXAMETHASONE SODIUM PHOSPHATE 4 MG/ML IJ SOLN: 4.0000 mg | INTRAMUSCULAR | Status: DC

## 2021-10-18 MED ORDER — FENTANYL CITRATE (PF) 100 MCG/2ML IJ SOLN
INTRAMUSCULAR | Status: DC | PRN
  Administered 2021-10-18 (×2): 50 ug via INTRAVENOUS

## 2021-10-18 MED ORDER — FENTANYL CITRATE PF 50 MCG/ML IJ SOSY
25.0000 ug | PREFILLED_SYRINGE | INTRAMUSCULAR | Status: DC | PRN
  Administered 2021-10-18 (×2): 25 ug via INTRAVENOUS

## 2021-10-18 MED ORDER — ORAL CARE MOUTH RINSE: 15.0000 mL | Freq: Once | OROMUCOSAL | Status: DC

## 2021-10-18 MED ORDER — KCL IN DEXTROSE-NACL 10-5-0.45 MEQ/L-%-% IV SOLN
INTRAVENOUS | Status: DC
  Filled 2021-10-18: qty 1000

## 2021-10-18 MED ORDER — ONDANSETRON HCL 4 MG/2ML IJ SOLN
INTRAMUSCULAR | Status: DC | PRN
  Administered 2021-10-18: 4 mg via INTRAVENOUS

## 2021-10-18 MED ORDER — EPHEDRINE 5 MG/ML INJ
INTRAVENOUS | Status: AC
  Filled 2021-10-18: qty 5

## 2021-10-18 MED ORDER — CHLORHEXIDINE GLUCONATE 0.12 % MT SOLN
15.0000 mL | Freq: Once | OROMUCOSAL | Status: AC
  Administered 2021-10-18: 15 mL via OROMUCOSAL

## 2021-10-18 MED ORDER — STERILE WATER FOR IRRIGATION IR SOLN
Status: DC | PRN
  Administered 2021-10-18: 1000 mL

## 2021-10-18 MED ORDER — ROCURONIUM BROMIDE 10 MG/ML (PF) SYRINGE
PREFILLED_SYRINGE | INTRAVENOUS | Status: DC | PRN
  Administered 2021-10-18: 50 mg via INTRAVENOUS

## 2021-10-18 MED ORDER — STERILE WATER FOR INJECTION IJ SOLN
INTRAMUSCULAR | Status: AC
  Filled 2021-10-18: qty 10

## 2021-10-18 MED ORDER — ORAL CARE MOUTH RINSE: 15.0000 mL | Freq: Once | OROMUCOSAL | Status: AC

## 2021-10-18 MED ORDER — LIDOCAINE HCL (PF) 2 % IJ SOLN
INTRAMUSCULAR | Status: AC
  Filled 2021-10-18: qty 15

## 2021-10-18 MED ORDER — PHENYLEPHRINE 80 MCG/ML (10ML) SYRINGE FOR IV PUSH (FOR BLOOD PRESSURE SUPPORT)
PREFILLED_SYRINGE | INTRAVENOUS | Status: AC
  Filled 2021-10-18: qty 10

## 2021-10-18 SURGICAL SUPPLY — 76 items
ADH SKN CLS APL DERMABOND .7 (GAUZE/BANDAGES/DRESSINGS) ×2
AGENT HMST KT MTR STRL THRMB (HEMOSTASIS)
APL ESCP 34 STRL LF DISP (HEMOSTASIS)
APPLICATOR SURGIFLO ENDO (HEMOSTASIS) IMPLANT
BACTOSHIELD CHG 4% 4OZ (MISCELLANEOUS) ×1
BAG LAPAROSCOPIC 12 15 PORT 16 (BASKET) IMPLANT
BAG RETRIEVAL 10 (BASKET)
BAG RETRIEVAL 12/15 (BASKET) IMPLANT
BLADE SURG SZ10 CARB STEEL (BLADE) IMPLANT
COVER BACK TABLE 60X90IN (DRAPES) ×3 IMPLANT
COVER TIP SHEARS 8 DVNC (MISCELLANEOUS) ×2 IMPLANT
COVER TIP SHEARS 8MM DA VINCI (MISCELLANEOUS) ×3
DERMABOND ADVANCED (GAUZE/BANDAGES/DRESSINGS) ×1
DERMABOND ADVANCED .7 DNX12 (GAUZE/BANDAGES/DRESSINGS) ×2 IMPLANT
DRAPE ARM DVNC X/XI (DISPOSABLE) ×8 IMPLANT
DRAPE COLUMN DVNC XI (DISPOSABLE) ×2 IMPLANT
DRAPE DA VINCI XI ARM (DISPOSABLE) ×12
DRAPE DA VINCI XI COLUMN (DISPOSABLE) ×3
DRAPE SHEET LG 3/4 BI-LAMINATE (DRAPES) ×3 IMPLANT
DRAPE SURG IRRIG POUCH 19X23 (DRAPES) ×3 IMPLANT
DRSG OPSITE POSTOP 4X6 (GAUZE/BANDAGES/DRESSINGS) IMPLANT
DRSG OPSITE POSTOP 4X8 (GAUZE/BANDAGES/DRESSINGS) IMPLANT
ELECT PENCIL ROCKER SW 15FT (MISCELLANEOUS) IMPLANT
ELECT REM PT RETURN 15FT ADLT (MISCELLANEOUS) ×3 IMPLANT
GAUZE 4X4 16PLY ~~LOC~~+RFID DBL (SPONGE) ×3 IMPLANT
GLOVE BIO SURGEON STRL SZ 6 (GLOVE) ×12 IMPLANT
GLOVE BIO SURGEON STRL SZ 6.5 (GLOVE) ×6 IMPLANT
GOWN STRL REUS W/ TWL LRG LVL3 (GOWN DISPOSABLE) ×8 IMPLANT
GOWN STRL REUS W/TWL LRG LVL3 (GOWN DISPOSABLE) ×12
HOLDER FOLEY CATH W/STRAP (MISCELLANEOUS) IMPLANT
IRRIG SUCT STRYKERFLOW 2 WTIP (MISCELLANEOUS) ×3 IMPLANT
IRRIGATION SUCT STRKRFLW 2 WTP (MISCELLANEOUS) ×2 IMPLANT
KIT PROCEDURE DA VINCI SI (MISCELLANEOUS)
KIT PROCEDURE DVNC SI (MISCELLANEOUS) IMPLANT
KIT TURNOVER KIT A (KITS) IMPLANT
LIGASURE IMPACT 36 18CM CVD LR (INSTRUMENTS) IMPLANT
MANIPULATOR ADVINCU DEL 3.0 PL (MISCELLANEOUS) IMPLANT
MANIPULATOR ADVINCU DEL 3.5 PL (MISCELLANEOUS) IMPLANT
MANIPULATOR UTERINE 4.5 ZUMI (MISCELLANEOUS) ×1 IMPLANT
NDL HYPO 21X1.5 SAFETY (NEEDLE) ×2 IMPLANT
NDL SPNL 18GX3.5 QUINCKE PK (NEEDLE) IMPLANT
NEEDLE HYPO 21X1.5 SAFETY (NEEDLE) ×3 IMPLANT
NEEDLE SPNL 18GX3.5 QUINCKE PK (NEEDLE) IMPLANT
OBTURATOR OPTICAL STANDARD 8MM (TROCAR) ×3
OBTURATOR OPTICAL STND 8 DVNC (TROCAR) ×2 IMPLANT
OBTURATOR OPTICALSTD 8 DVNC (TROCAR) ×2 IMPLANT
PACK ROBOT GYN CUSTOM WL (TRAY / TRAY PROCEDURE) ×3 IMPLANT
PAD POSITIONING PINK XL (MISCELLANEOUS) ×3 IMPLANT
PORT ACCESS TROCAR AIRSEAL 12 (TROCAR) ×2 IMPLANT
PORT ACCESS TROCAR AIRSEAL 5M (TROCAR) ×1
SCRUB CHG 4% DYNA-HEX 4OZ (MISCELLANEOUS) ×2 IMPLANT
SEAL CANN UNIV 5-8 DVNC XI (MISCELLANEOUS) ×8 IMPLANT
SEAL XI 5MM-8MM UNIVERSAL (MISCELLANEOUS) ×12
SET TRI-LUMEN FLTR TB AIRSEAL (TUBING) ×3 IMPLANT
SPIKE FLUID TRANSFER (MISCELLANEOUS) ×3 IMPLANT
SPONGE T-LAP 18X18 ~~LOC~~+RFID (SPONGE) IMPLANT
SURGIFLO W/THROMBIN 8M KIT (HEMOSTASIS) IMPLANT
SUT MNCRL AB 4-0 PS2 18 (SUTURE) IMPLANT
SUT PDS AB 1 TP1 96 (SUTURE) IMPLANT
SUT VIC AB 0 CT1 27 (SUTURE)
SUT VIC AB 0 CT1 27XBRD ANTBC (SUTURE) IMPLANT
SUT VIC AB 2-0 CT1 27 (SUTURE)
SUT VIC AB 2-0 CT1 TAPERPNT 27 (SUTURE) IMPLANT
SUT VIC AB 4-0 PS2 18 (SUTURE) ×6 IMPLANT
SYR 10ML LL (SYRINGE) IMPLANT
SYS BAG RETRIEVAL 10MM (BASKET) IMPLANT
SYS WOUND ALEXIS 18CM MED (MISCELLANEOUS) IMPLANT
SYSTEM BAG RETRIEVAL 10MM (BASKET) IMPLANT
SYSTEM WOUND ALEXIS 18CM MED (MISCELLANEOUS) IMPLANT
TOWEL OR NON WOVEN STRL DISP B (DISPOSABLE) IMPLANT
TRAP SPECIMEN MUCUS 40CC (MISCELLANEOUS) IMPLANT
TRAY FOLEY MTR SLVR 16FR STAT (SET/KITS/TRAYS/PACK) ×3 IMPLANT
TROCAR XCEL NON-BLD 5MMX100MML (ENDOMECHANICALS) IMPLANT
UNDERPAD 30X36 HEAVY ABSORB (UNDERPADS AND DIAPERS) ×6 IMPLANT
WATER STERILE IRR 1000ML POUR (IV SOLUTION) ×3 IMPLANT
YANKAUER SUCT BULB TIP 10FT TU (MISCELLANEOUS) IMPLANT

## 2021-10-18 NOTE — Transfer of Care (Signed)
Immediate Anesthesia Transfer of Care Note ? ?Patient: Mikayla Harrison ? ?Procedure(s) Performed: XI ROBOTIC ASSISTED TOTAL HYSTERECTOMY WITH BILATERAL SALPINGO OOPHORECTOMY ?SENTINEL NODE BIOPSY ?CYSTOSCOPY ? ?Patient Location: PACU ? ?Anesthesia Type:General ? ?Level of Consciousness: drowsy and patient cooperative ? ?Airway & Oxygen Therapy: Patient Spontanous Breathing and Patient connected to face mask oxygen ? ?Post-op Assessment: Report given to RN and Post -op Vital signs reviewed and stable ? ?Post vital signs: Reviewed and stable ? ?Last Vitals:  ?Vitals Value Taken Time  ?BP    ?Temp    ?Pulse    ?Resp    ?SpO2    ? ? ?Last Pain:  ?Vitals:  ? 10/18/21 1348  ?TempSrc:   ?PainSc: 0-No pain  ?   ? ?Patients Stated Pain Goal: 3 (10/18/21 1348) ? ?Complications: No notable events documented. ?

## 2021-10-18 NOTE — H&P (Signed)
Gynecologic Oncology H&P ? ?10/18/21 ? ?Treatment History: ?Patient initially saw her OB/GYN in January with postmenopausal bleeding that began in June 2022.  She initially had what she describes as a "gush of mucus" with subsequent discharge and bleeding.  Patient notes that after an episode like a menses, her bleeding stopped.  She then had intermittent episodes of bleeding.  She describes to be bleeding as not happening daily, not associated with any clots.  She wears a pad although sometimes there is very little on the pad and sometimes more.  She denies any associated pain or cramping. ?  ?She endorses a good appetite without nausea or emesis.  Denies any weight changes.  She is constipated intermittently at baseline, mostly does not need to use any medications.  Denies any change to bladder function. ?  ?Patient had a Pap smear performed which showed ASCUS Pap, high-risk HPV not detected.  She then underwent pelvic ultrasound exam on 07/20/2021 with a uterus measuring 7.3 x 4.9 x 3.6 cm and an endometrial lining of 29 mm.  Endometrium was noted to be heterogenous with solid and cystic components, slightly vascular.  Right ovary atrophic, left ovary not seen.  Patient then represented on 3/13 for endometrial biopsy.  Cervical biopsy was also performed.  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  IHC staining shows clear cell carcinoma pattern and high-grade foci with the remainder of the carcinoma within the endometrioid and low-grade pattern.  Cervical biopsy shows mesodermal stromal polyp, squamous mucosa negative for dysplasia or malignancy. ?  ?CT of the chest, abdomen, and pelvis was performed on 3/27.  This showed a low-attenuation heterogenously enhancing masslike area in the left side of the uterine body and fundus measuring 1.7 x 1.9 x 3.5 cm.  No definitive findings to suggest metastatic disease.  Small left adrenal nodule, stable compared  to prior study from 2015 and considered likely benign. ?  ?History is notable for an admission with abdominal pain, nausea and emesis approximately 10 years ago where she was found to have a partial small bowel obstruction on imaging.  This resolved with conservative measures.  She denies any further episodes since. ?  ?Interval History: ?Doing well. No new symptoms. ? ?Past Medical/Surgical History: ?Past Medical History:  ?Diagnosis Date  ? Anemia   ? Anxiety   ? Arthritis   ? CKD (chronic kidney disease) stage 3, GFR 30-59 ml/min (HCC)   ? Colon polyp   ? Diverticulosis   ? Endometrial cancer ([place])   ? Hypertension   ? Hypothyroidism   ? Iron deficiency   ? Peripheral neuropathy   ? in bilateral legs  ? Plantar fasciitis   ? Pre-diabetes   ? Prediabetes   ? Pt denies  ? Small bowel obstruction ([place])   ? ? ?Past Surgical History:  ?Procedure Laterality Date  ? CATARACT EXTRACTION    ? EYE SURGERY    ? MACULAR TEAR    ? Vericose vein  1965  ? ? ?Family History  ?Problem Relation Age of Onset  ? Heart disease Mother   ? Stroke Mother   ? Heart disease Father   ? Colon polyps Sister   ? Diabetes Sister   ? Stroke Sister   ? Heart disease Brother   ? Stroke Brother   ? Hypertension Son   ? Kidney cancer Nephew   ? ? ?Social History  ? ?Socioeconomic History  ? Marital status: Widowed  ?  Spouse  name: Not on file  ? Number of children: Not on file  ? Years of education: Not on file  ? Highest education level: Not on file  ?Occupational History  ? Not on file  ?Tobacco Use  ? Smoking status: Former  ?  Packs/day: 1.00  ?  Years: 15.00  ?  Pack years: 15.00  ?  Types: Cigarettes  ?  Quit date: 06/11/1965  ?  Years since quitting: 56.3  ? Smokeless tobacco: Never  ?Vaping Use  ? Vaping Use: Never used  ?Substance and Sexual Activity  ? Alcohol use: No  ? Drug use: No  ? Sexual activity: Not Currently  ?Other Topics Concern  ? Not on file  ?Social History Narrative  ? Not on file  ? ?Social Determinants of Health   ? ?Financial Resource Strain: Not on file  ?Food Insecurity: Not on file  ?Transportation Needs: Not on file  ?Physical Activity: Not on file  ?Stress: Not on file  ?Social Connections: Not on file  ? ? ?Current Medications: ? ?Current Facility-Administered Medications:  ?  ceFAZolin (ANCEF) IVPB 2g/100 mL premix, 2 g, Intravenous, On Call to OR, Cross, Melissa D, NP ?  dexamethasone (DECADRON) injection 4 mg, 4 mg, Intravenous, On Call to OR, Cross, Melissa D, NP ?  lactated ringers infusion, , Intravenous, Continuous, Lynda Rainwater, MD, Last Rate: 10 mL/hr at 10/18/21 1430, New Bag at 10/18/21 1430 ?  lactated ringers infusion, , Intravenous, Continuous, Duane Boston, MD ? ?Review of Systems: ?Pertinent positives include constipation, joint pain. ?Denies appetite changes, fevers, chills, fatigue, unexplained weight changes. ?Denies hearing loss, neck lumps or masses, mouth sores, ringing in ears or voice changes. ?Denies cough or wheezing.  Denies shortness of breath. ?Denies chest pain or palpitations. Denies leg swelling. ?Denies abdominal distention, pain, blood in stools, diarrhea, nausea, vomiting, or early satiety. ?Denies pain with intercourse, dysuria, frequency, hematuria or incontinence. ?Denies hot flashes, pelvic pain, vaginal bleeding or vaginal discharge.   ?Denies back pain or muscle pain/cramps. ?Denies itching, rash, or wounds. ?Denies dizziness, headaches, numbness or seizures. ?Denies swollen lymph nodes or glands, denies easy bruising or bleeding. ?Denies anxiety, depression, confusion, or decreased concentration. ? ?Physical Exam: ?BP (!) 142/67   Pulse 68   Temp 98.3 ?F (36.8 ?C) (Oral)   Resp 16   Ht '5\' 6"'$  (1.676 m)   Wt 149 lb 0.5 oz (67.6 kg)   SpO2 99%   BMI 24.05 kg/m?  ?General: Alert, oriented, no acute distress.  ?HEENT: Normocephalic, atraumatic. Sclera anicteric.  ?Chest: Clear to auscultation bilaterally. No wheezes, rhonchi, or rales. ?Cardiovascular: Regular rate  and rhythm, no murmurs, rubs, or gallops.  ?Abdomen: Normoactive bowel sounds. Soft, moderately distended in the lower abdomen and tympanitic, nontender to palpation.  Significant rectus diastases.  No masses or hepatosplenomegaly appreciated. No palpable fluid wave.  ?Extremities: Grossly normal range of motion. Warm, well perfused.  1+ edema bilaterally, more notable on the left lower extremity along the lower anterior and lateral leg.  ? ?Laboratory & Radiologic Studies: ? ?  Latest Ref Rng & Units 10/10/2021  ?  2:30 PM 09/12/2021  ?  1:26 PM 06/19/2021  ?  3:23 PM  ?CBC  ?WBC 4.0 - 10.5 K/uL 7.2   8.1   9.1    ?Hemoglobin 12.0 - 15.0 g/dL 10.3   10.3   10.5    ?Hematocrit 36.0 - 46.0 % 32.7   32.8   31.6    ?Platelets 150 -  400 K/uL 263   269   312    ? ?CT A/P on 3/29: ?1. Heterogeneously enhancing mass-like area in the left side of the ?uterine body and fundus, which presumably corresponds to the ?patient's reported clear cell endometrial carcinoma, as above. No ?definitive findings to suggest metastatic disease to the chest, ?abdomen or pelvis. ?2. Small left adrenal nodule, stable compared to prior study from ?2015, considered benign (likely an adenoma). ?3. Aortic atherosclerosis, in addition to 2 vessel coronary artery ?disease. ?4. Additional incidental findings, as above. ? ?Assessment & Plan: ?Mikayla Harrison is a 88 y.o. woman with linical stage I endometrial cancer, high grade. ? ?See prior note for counseling. Definitive stage surgery today. Plan for robotic TRH/BSO/ lymph node assessment.  ? ?Jeral Pinch, MD  ?Division of Gynecologic Oncology  ?Department of Obstetrics and Gynecology  ?[facility]  ? ?

## 2021-10-18 NOTE — Op Note (Signed)
OPERATIVE NOTE ? ?Pre-operative Diagnosis: endometrial cancer, high grade ? ?Post-operative Diagnosis: same ? ?Operation: Robotic-assisted laparoscopic total hysterectomy with bilateral salpingoophorectomy, SLN biopsy  ? ?Surgeon: Jeral Pinch MD ? ?Assistant Surgeon: Joylene John NP ? ?Anesthesia: GET ? ?Urine Output: 900 cc ? ?Operative Findings: On EUA, small mobile uterus. Normal upper abdominal survey on intra-abdominal entry. Normal omentum, small and large bowel. Significant diverticular disease. Normal adnexa. Uterus 10cm with some small subserosal fibroids. Atrophic adnexa. Mapping successful to right obturator and left external iliac SLN. Significant bladder prolapse. No intra-abdominal or pelvic evidence of disease. ?Cystoscopy performed given some difficulty developing bladder flap; dome intact, bilateral ureteral orifices.  ? ?Estimated Blood Loss:  75cc      ? ?Total IV Fluids: see I&O flowsheet ?        ?Specimens: uterus, cervix, bilateral tubes and ovaries, bilateral SLNs (right obturator, left external iliac) ?        ?Complications:  None appreciated; patient tolerated the procedure well. ?        ?Disposition: PACU - hemodynamically stable. ? ?Procedure Details  ?The patient was seen in the Holding Room. The risks, benefits, complications, treatment options, and expected outcomes were discussed with the patient.  The patient concurred with the proposed plan, giving informed consent.  The site of surgery properly noted/marked. The patient was identified as Mikayla Harrison and the procedure verified as a Robotic-assisted hysterectomy with bilateral salpingo oophorectomy with SLN biopsy.  ? ?After induction of anesthesia, the patient was draped and prepped in the usual sterile manner. Patient was placed in supine position after anesthesia and draped and prepped in the usual sterile manner as follows: Her arms were tucked to her side with all appropriate precautions.  The patient was secured to the  bed with tape and padding.  The patient was placed in the semi-lithotomy position in [place].  The perineum and vagina were prepped with CholoraPrep. The patient was draped after the CholoraPrep had been allowed to dry for 3 minutes.  A Time Out was held and the above information confirmed. ? ?The urethra was prepped with Betadine. Foley catheter was placed.  A sterile speculum was placed in the vagina.  The cervix was grasped with a single-tooth tenaculum. '2mg'$  total of ICG was injected into the cervical stroma at 2 and 9 o'clock with 1cc injected at a 1cm and 81m depth (concentration 0.'5mg'$ /ml) in all locations. The cervix was dilated with PKennon Roundsdilators.  The ZUMI uterine manipulator with a medium colpotomizer ring was placed without difficulty.  A pneum occluder balloon was placed over the manipulator.  OG tube placement was confirmed and to suction.  ? ?Next, a 10 mm skin incision was made 1 cm below the subcostal margin in the midclavicular line.  The 5 mm Optiview port and scope was used for direct entry.  Opening pressure was under 10 mm CO2.  The abdomen was insufflated and the findings were noted as above.   At this point and all points during the procedure, the patient's intra-abdominal pressure did not exceed 15 mmHg. Next, an 8 mm skin incision was made inferior to the umbilicus and a right and left port were placed about 8 cm lateral to the robot port on the right and left side.  A fourth arm was placed on the right.  The 5 mm assist trocar was exchanged for a 10-12 mm port. All ports were placed under direct visualization.  The patient was placed in steep Trendelenburg.  Bowel  was folded away into the upper abdomen.  The robot was docked in the normal manner. ? ?The right and left peritoneum were opened parallel to the IP ligament to open the retroperitoneal spaces bilaterally. The round ligaments were transected. The SLN mapping was performed in bilateral pelvic basins. After identifying the  ureters, the para rectal and paravesical spaces were opened up entirely with careful dissection below the level of the ureters bilaterally and to the depth of the uterine artery origin in order to skeletonize the uterine "web" and ensure visualization of all parametrial channels. The para-aortic basins were carefully exposed and evaluated for isolated para-aortic SLN's. Lymphatic channels were identified travelling to the following visualized sentinel lymph node's: right obturator, left external iliac. These SLN's were separated from their surrounding lymphatic tissue, removed and sent for permanent pathology. ? ?The hysterectomy was started.  The ureter was again noted to be on the medial leaf of the broad ligament.  The peritoneum above the ureter was incised and stretched and the infundibulopelvic ligament was skeletonized, cauterized and cut.  The posterior peritoneum was taken down to the level of the KOH ring.  The anterior peritoneum was also taken down.  The bladder flap was created to the level of the KOH ring.  This was done with some difficulty given prolapse and difficulty visualizing the KOH ring. The uterine artery on the right side was skeletonized, cauterized and cut in the normal manner.  A similar procedure was performed on the left.  The colpotomy was made and the uterus, cervix, bilateral ovaries and tubes were amputated and delivered through the vagina.  Pedicles were inspected and excellent hemostasis was achieved.   ? ?The colpotomy at the vaginal cuff was closed with Vicryl on a CT1 needle in a running manner.  Irrigation was used and excellent hemostasis was achieved.  At this point in the procedure was completed.  Robotic instruments were removed under direct visulaization.  The robot was undocked. The fascia at the 10-12 mm port was closed with 0 Vicryl on a UR-5 needle.  The subcuticular tissue was closed with 4-0 Vicryl and the skin was closed with 4-0 Monocryl in a subcuticular manner.   Dermabond was applied.   ? ?Cystoscopy was performed with findings noted above. Foley was replaced after cystoscopy was performed. ? ?The vagina was swabbed with  minimal bleeding noted.   All sponge, lap and needle counts were correct x  3.  ? ?The patient was transferred to the recovery room in stable condition. ? ?Jeral Pinch, MD ? ?

## 2021-10-18 NOTE — Anesthesia Procedure Notes (Signed)
Procedure Name: Intubation ?Date/Time: 10/18/2021 5:32 PM ?Performed by: Gean Maidens, CRNA ?Pre-anesthesia Checklist: Patient identified, Emergency Drugs available, Suction available, Patient being monitored and Timeout performed ?Patient Re-evaluated:Patient Re-evaluated prior to induction ?Oxygen Delivery Method: Circle system utilized ?Preoxygenation: Pre-oxygenation with 100% oxygen ?Induction Type: IV induction ?Ventilation: Mask ventilation without difficulty ?Laryngoscope Size: 3 and Mac ?Grade View: Grade I ?Tube type: Oral ?Tube size: 7.0 mm ?Number of attempts: 1 ?Airway Equipment and Method: Stylet ?Placement Confirmation: ETT inserted through vocal cords under direct vision, positive ETCO2 and breath sounds checked- equal and bilateral ?Secured at: 21 cm ?Tube secured with: Tape ?Dental Injury: Teeth and Oropharynx as per pre-operative assessment  ? ? ? ? ?

## 2021-10-18 NOTE — Progress Notes (Signed)
I tried to call the patient?s daughter on her cell phone multiple times after surgery. No answer. Also went out to the waiting room but no one was there. ? ?Dorian Pod MD

## 2021-10-18 NOTE — Discharge Instructions (Addendum)
AFTER SURGERY INSTRUCTIONS ?  ?Return to work: 4-6 weeks if applicable ? ?YOU CAN RESTART TAKING YOUR ASPIRIN 81 MG TOMORROW, MAY 12 ?  ?Activity: ?1. Be up and out of the bed during the day.  Take a nap if needed.  You may walk up steps but be careful and use the hand rail.  Stair climbing will tire you more than you think, you may need to stop part way and rest.  ?  ?2. No lifting or straining for 6 weeks over 10 pounds. No pushing, pulling, straining for 6 weeks. ?  ?3. No driving for around 1 week(s).  Do not drive if you are taking narcotic pain medicine and make sure that your reaction time has returned.  ?  ?4. You can shower as soon as the next day after surgery. Shower daily.  Use your regular soap and water (not directly on the incision) and pat your incision(s) dry afterwards; don't rub.  No tub baths or submerging your body in water until cleared by your surgeon. If you have the soap that was given to you by pre-surgical testing that was used before surgery, you do not need to use it afterwards because this can irritate your incisions.  ?  ?5. No sexual activity and nothing in the vagina for 8 weeks. ?  ?6. You may experience a small amount of clear drainage from your incisions, which is normal.  If the drainage persists, increases, or changes color please call the office. ?  ?7. Do not use creams, lotions, or ointments such as neosporin on your incisions after surgery until advised by your surgeon because they can cause removal of the dermabond glue on your incisions.   ?  ?8. You may experience vaginal spotting after surgery or around the 6-8 week mark from surgery when the stitches at the top of the vagina begin to dissolve.  The spotting is normal but if you experience heavy bleeding, call our office. ?  ?9. Take Tylenol or ibuprofen first for pain and only use narcotic pain medication for severe pain not relieved by the Tylenol or Ibuprofen.  Monitor your Tylenol intake to a max of 4,000 mg in a 24  hour period. You can alternate these medications after surgery. ?  ?Diet: ?1. Low sodium Heart Healthy Diet is recommended but you are cleared to resume your normal (before surgery) diet after your procedure. ?  ?2. It is safe to use a laxative, such as Miralax or Colace, if you have difficulty moving your bowels. You have been prescribed Sennakot-S to take at bedtime every evening after surgery to keep bowel movements regular and to prevent constipation.   ?  ?Wound Care: ?1. Keep clean and dry.  Shower daily. ?  ?Reasons to call the Doctor: ?Fever - Oral temperature greater than 100.4 degrees Fahrenheit ?Foul-smelling vaginal discharge ?Difficulty urinating ?Nausea and vomiting ?Increased pain at the site of the incision that is unrelieved with pain medicine. ?Difficulty breathing with or without chest pain ?New calf pain especially if only on one side ?Sudden, continuing increased vaginal bleeding with or without clots. ?  ?Contacts: ?For questions or concerns you should contact: ?  ?Dr. Jeral Pinch at [phone] ?  ?Joylene John, NP at [phone] ?  ?After Hours: call [phone] and have the GYN Oncologist paged/contacted (after 5 pm or on the weekends). ?  ?Messages sent via mychart are for non-urgent matters and are not responded to after hours so for urgent needs, please call  the after hours number. ?

## 2021-10-19 ENCOUNTER — Telehealth: Payer: Self-pay | Admitting: *Deleted

## 2021-10-19 DIAGNOSIS — N183 Chronic kidney disease, stage 3 unspecified: Secondary | ICD-10-CM | POA: Diagnosis not present

## 2021-10-19 DIAGNOSIS — Z87891 Personal history of nicotine dependence: Secondary | ICD-10-CM | POA: Diagnosis not present

## 2021-10-19 DIAGNOSIS — I129 Hypertensive chronic kidney disease with stage 1 through stage 4 chronic kidney disease, or unspecified chronic kidney disease: Secondary | ICD-10-CM | POA: Diagnosis not present

## 2021-10-19 DIAGNOSIS — C541 Malignant neoplasm of endometrium: Secondary | ICD-10-CM | POA: Diagnosis not present

## 2021-10-19 DIAGNOSIS — E119 Type 2 diabetes mellitus without complications: Secondary | ICD-10-CM | POA: Diagnosis not present

## 2021-10-19 LAB — BASIC METABOLIC PANEL
Anion gap: 9 (ref 5–15)
BUN: 13 mg/dL (ref 8–23)
CO2: 24 mmol/L (ref 22–32)
Calcium: 8.2 mg/dL — ABNORMAL LOW (ref 8.9–10.3)
Chloride: 101 mmol/L (ref 98–111)
Creatinine, Ser: 0.89 mg/dL (ref 0.44–1.00)
GFR, Estimated: >60 mL/min (ref >60)
Glucose, Bld: 122 mg/dL — ABNORMAL HIGH (ref 70–99)
Potassium: 4.6 mmol/L (ref 3.5–5.1)
Sodium: 134 mmol/L — ABNORMAL LOW (ref 135–145)

## 2021-10-19 LAB — CBC
HCT: 31.8 % — ABNORMAL LOW (ref 36.0–46.0)
Hemoglobin: 10.4 g/dL — ABNORMAL LOW (ref 12.0–15.0)
MCH: 27.7 pg (ref 26.0–34.0)
MCHC: 32.7 g/dL (ref 30.0–36.0)
MCV: 84.6 fL (ref 80.0–100.0)
Platelets: 232 10*3/uL (ref 150–400)
RBC: 3.76 MIL/uL — ABNORMAL LOW (ref 3.87–5.11)
RDW: 14.3 % (ref 11.5–15.5)
WBC: 10.9 10*3/uL — ABNORMAL HIGH (ref 4.0–10.5)
nRBC: 0 % (ref 0.0–0.2)

## 2021-10-19 MED ORDER — MUSCLE RUB 10-15 % EX CREA
TOPICAL_CREAM | Freq: Two times a day (BID) | CUTANEOUS | Status: DC | PRN
  Filled 2021-10-19 (×2): qty 85

## 2021-10-19 NOTE — Progress Notes (Signed)
Transition of Care (TOC) Screening Note ? ?Patient Details  ?Name: Mikayla Harrison ?Date of Birth: 06-28-1931 ? ?Transition of Care (TOC) CM/SW Contact:    ?Sherie Don, LCSW ?Phone Number: ?10/19/2021, 10:50 AM ? ?Transition of Care Department [facility]) has reviewed patient and no TOC needs have been identified at this time. We will continue to monitor patient advancement through interdisciplinary progression rounds. If new patient transition needs arise, please place a TOC consult. ?

## 2021-10-19 NOTE — Telephone Encounter (Signed)
Spoke with pt's daughter Ms.Kepley this morning to inform her of prescriptions sent in for pt to pick up before she had her surgery and it has not been picked up yet. She needs to pick up those prescription before pt gets discharged from the hospital. Per Joylene John, NP pt needs to take sennakot every night. She verbalized understanding.  ?

## 2021-10-19 NOTE — Discharge Summary (Signed)
?Physician Discharge Summary  ?Patient ID: ?Mikayla Harrison ?MRN: [id number] ?DOB/AGE: 1932-01-02 88 y.o. ? ?Admit date: 10/18/2021 ?Discharge date: 10/19/2021 ? ?Admission Diagnoses: Endometrial cancer ([place]) ? ?Discharge Diagnoses:  ?Principal Problem: ?  Endometrial cancer ([place]) ?Active Problems: ?  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 ? ?Discharged Condition:  The patient is in good condition and stable for discharge.   ? ?Hospital Course: On 10/18/2021, the patient underwent the following: Procedure(s): ?XI ROBOTIC ASSISTED TOTAL HYSTERECTOMY WITH BILATERAL SALPINGO-OOPHORECTOMY, SENTINEL NODE BIOPSY, CYSTOSCOPY. She was monitored overnight with a foley catheter in place. The postoperative course was uneventful.  She was discharged to home on postoperative day 1 tolerating a regular diet, ambulating, pain controlled, passing flatus, voiding s/p foley removal, labs stable.  ? ?Consults: None ? ?Significant Diagnostic Studies: labs ? ?Treatments: surgery: see above ? ?Discharge Exam: ?Blood pressure 122/60, pulse 68, temperature 98.2 ?F (36.8 ?C), resp. rate 17, height '5\' 6"'$  (1.676 m), weight 149 lb 0.5 oz (67.6 kg), SpO2 100 %. ?General appearance: alert, cooperative, and no distress ?Resp: clear to auscultation bilaterally ?Cardio: regular rate and rhythm, S1, S2 normal, no murmur, click, rub or gallop ?GI: soft, non-tender; bowel sounds normal; no masses,  no organomegaly ?Extremities: pt has chronic LLE edema-doppler preop negative, no significant edema currently  ?Incision/Wound: lap sites to the abdomen with dermabond intact without drainage ? ?Disposition: Discharge disposition: 01-Home or Self Care ? ? ? ? ? ? ?Discharge Instructions   ? ? Call MD for:  difficulty breathing, headache or visual disturbances   Complete by: As directed ?  ? Call MD for:  extreme fatigue   Complete by: As directed ?  ? Call MD for:  hives   Complete by: As directed ?  ? Call MD for:  persistant dizziness or light-headedness    Complete by: As directed ?  ? Call MD for:  persistant nausea and vomiting   Complete by: As directed ?  ? Call MD for:  redness, tenderness, or signs of infection (pain, swelling, redness, odor or green/yellow discharge around incision site)   Complete by: As directed ?  ? Call MD for:  severe uncontrolled pain   Complete by: As directed ?  ? Call MD for:  temperature >100.4   Complete by: As directed ?  ? Diet - low sodium heart healthy   Complete by: As directed ?  ? Driving Restrictions   Complete by: As directed ?  ? No driving for 1 week(s) if you were able to drive before surgery.  Do not take narcotics and drive. You need to make sure your reaction time has returned.  ? Increase activity slowly   Complete by: As directed ?  ? Lifting restrictions   Complete by: As directed ?  ? No lifting greater than 10 lbs, pushing, pulling, straining for 6 weeks.  ? No wound care   Complete by: As directed ?  ? Sexual Activity Restrictions   Complete by: As directed ?  ? No sexual activity, nothing in the vagina, for 8 weeks.  ? ?  ? ?Allergies as of 10/19/2021   ? ?   Reactions  ? Sulfa Antibiotics Other (See Comments)  ? Codeine Nausea Only  ? Prednisone Nausea And Vomiting, Nausea Only, Other (See Comments)  ? Asa [aspirin] Anxiety, Other (See Comments)  ? Take '81mg'$  cannot take '325mg'$  gets nervous  ? ?  ? ?  ?Medication List  ?  ? ?STOP taking these medications   ? ?  aspirin 81 MG EC tablet ?  ? ?  ? ?TAKE these medications   ? ?acetaminophen 325 MG tablet ?Commonly known as: TYLENOL ?Take 325 mg by mouth every 6 (six) hours as needed. ?  ?amLODipine 10 MG tablet ?Commonly known as: NORVASC ?TAKE 1/2 TO 1 (ONE-HALF TO ONE) TABLET BY MOUTH ONCE DAILY FOR BLOOD PRESSURE ?What changed: See the new instructions. ?  ?atenolol 50 MG tablet ?Commonly known as: TENORMIN ?Take  1 tablet  Daily  for BP                         /       Take 1 tablet by mouth once daily for blood pressure ?What changed:  ?how much to take ?how to take  this ?when to take this ?reasons to take this ?additional instructions ?  ?azithromycin 250 MG tablet ?Commonly known as: Zithromax ?Take 2 tablets (500 mg) on Day 1 followed by 1 tablet  (250 mg) daily until complete. ?  ?benazepril 40 MG tablet ?Commonly known as: LOTENSIN ?Take 1 tablet by mouth once daily for blood pressure ?What changed:  ?how much to take ?how to take this ?when to take this ?additional instructions ?  ?benzonatate 100 MG capsule ?Commonly known as: Best boy ?Take 1 capsule (100 mg total) by mouth 3 (three) times daily as needed for cough. ?  ?clorazepate 7.5 MG tablet ?Commonly known as: TRANXENE ?Take 1/2 - 1 tablet 2 - 3 x /day ONLY if needed for Anxiety Attack &  limit to 5 days /week to avoid Addiction & Dementia ?What changed:  ?how much to take ?how to take this ?when to take this ?reasons to take this ?  ?clotrimazole 1 % cream ?Commonly known as: LOTRIMIN ?Apply 1 application topically 2 (two) times daily. ?  ?Magnesium 250 MG Tabs ?Take 1 tablet by mouth daily. ?  ?meclizine 25 MG tablet ?Commonly known as: ANTIVERT ?TAKE 1/2 TO 1 (ONE-HALF TO ONE) TABLET BY MOUTH THREE TIMES DAILY FOR MOTION SICKNESS AND DIZINESS ?  ?senna-docusate 8.6-50 MG tablet ?Commonly known as: Senokot-S ?Take 2 tablets by mouth at bedtime. For AFTER surgery, do not take if having diarrhea ?  ?Synthroid 150 MCG tablet ?Generic drug: levothyroxine ?Take  1 tablet  Daily  on an empty stomach with only water for 30 minutes & no Antacid meds, Calcium or Magnesium for 4 hours & avoid Biotin ?  ?traMADol 50 MG tablet ?Commonly known as: ULTRAM ?Take 1 tablet (50 mg total) by mouth every 6 (six) hours as needed for severe pain. For AFTER surgery only, do not take and drive ?  ?Vitamin B-12 2500 MCG Subl ?Take 2,500 mcg by mouth daily. ?  ?VITAMIN C PO ?Take 1,000 mg by mouth daily. ?  ?Vitamin D3 125 MCG (5000 UT) Tabs ?Take 5,000 Units by mouth daily. ?  ?zinc gluconate 50 MG tablet ?Take 50 mg by mouth  daily. ?  ? ?  ? ? Follow-up Information   ? ? Lafonda Mosses, MD Follow up on 11/03/2021.   ?Specialty: Gynecologic Oncology ?Why: at 4:50pm will be a PHONE visit with Dr. Berline Lopes to check in and discuss pathology. IN PERSON visit on 11/10/21 at 9:45am at the [facility]. ?Contact information: ?[address] ?[place] [address] ?[phone] ? ? ?  ?  ? ?  ?  ? ?  ? ? ?Greater than thirty minutes were spend for face to face  discharge instructions and discharge orders/summary in EPIC.  ? ?Signed: ?Francia Verry D Jennifer Payes ?10/19/2021, 1:36 PM ? ? ? ?  ?

## 2021-10-19 NOTE — Progress Notes (Signed)
1 Day Post-Op Procedure(s) (LRB): ?XI ROBOTIC ASSISTED TOTAL HYSTERECTOMY WITH BILATERAL SALPINGO OOPHORECTOMY (N/A) ?SENTINEL NODE BIOPSY (N/A) ?CYSTOSCOPY ? ?Subjective: ?Patient reports doing well this am. No nausea or emesis reported. Pain controlled with prn medications. She is tolerating liquids but has not had solid food. Passing flatus. Instructed on IS. No needs or concerns voiced.  ? ?Objective: ?Vital signs in last 24 hours: ?Temp:  [96.5 ?F (35.8 ?C)-98.3 ?F (36.8 ?C)] 98.2 ?F (36.8 ?C) (05/11 0177) ?Pulse Rate:  [60-80] 80 (05/11 0927) ?Resp:  [10-18] 18 (05/11 9390) ?BP: (117-168)/(59-95) 141/77 (05/11 0927) ?SpO2:  [96 %-100 %] 100 % (05/11 0927) ?Weight:  [149 lb 0.5 oz (67.6 kg)] 149 lb 0.5 oz (67.6 kg) (05/10 1331) ?  ? ?Intake/Output from previous day: ?05/10 0701 - 05/11 0700 ?In: 2300 [I.V.:2200; IV Piggyback:100] ?Out: 4750 [ZESPQ:3300; Blood:75] ? ?Physical Examination: ?General: alert, cooperative, and no distress ?Resp: clear to auscultation bilaterally ?Cardio: regular rate and rhythm, S1, S2 normal, no murmur, click, rub or gallop ?GI: soft, non-tender; bowel sounds normal; no masses,  no organomegaly and incision: lap sites to the abdomen with dermabond intact without drainage ?Extremities: extremities normal, atraumatic, no cyanosis or edema ?Foley catheter removed without difficulty with 200 cc of yellow urine in the bag ? ?Labs: ?WBC/Hgb/Hct/Plts:  10.9/10.4/31.8/232 (05/11 7622) BUN/Cr/glu/ALT/AST/amyl/lip:  13/0.89/--/--/--/--/-- (05/11 6333) ? ?Assessment: ?88 y.o. s/p Procedure(s): ?XI ROBOTIC ASSISTED TOTAL HYSTERECTOMY WITH BILATERAL SALPINGO-OOPHORECTOMY, SENTINEL NODE BIOPSY, CYSTOSCOPY: stable ?Pain:  Pain is well-controlled on PRN medications. ? ?Heme: Hgb 10.4 and Hct 31.8 this am. Appropriate compared with preop values and surgical losses. ? ?CV: BP and HR stable. Continue to monitor with ordered vital signs. ? ?GI: Tolerating liquids. Antiemetics ordered as needed. ? ?GU:  Adequate urine output documented. Foley removed this am.   ? ?FEN: No critical labs this am. ? ?Prophylaxis: SCDs and lovenox ordered. ? ?Plan: ?Saline lock IV ?Foley removed ?Instructed on how to order diet. Diet as tolerated ?Encouraged IS use ?Encouraged increasing mobility with assistance ?Continue plan of care per Dr. Berline Lopes ?If meeting milestones later today, plan for discharge home ? ? LOS: 0 days  ? ? ?Mikayla Harrison ?10/19/2021, 9:30 AM ? ? ? ?   ?

## 2021-10-19 NOTE — Plan of Care (Signed)
Patient ID: Mikayla Harrison, female   DOB: 01-23-1932, 88 y.o.   MRN: [id number] ? ?Problem: Education: ?Goal: Knowledge of General Education information will improve ?Description: Including pain rating scale, medication(s)/side effects and non-pharmacologic comfort measures ?Outcome: Adequate for Discharge ?  ?Problem: Health Behavior/Discharge Planning: ?Goal: Ability to manage health-related needs will improve ?Outcome: Adequate for Discharge ?  ?Problem: Clinical Measurements: ?Goal: Ability to maintain clinical measurements within normal limits will improve ?Outcome: Adequate for Discharge ?Goal: Will remain free from infection ?Outcome: Adequate for Discharge ?Goal: Diagnostic test results will improve ?Outcome: Adequate for Discharge ?Goal: Respiratory complications will improve ?Outcome: Adequate for Discharge ?Goal: Cardiovascular complication will be avoided ?Outcome: Adequate for Discharge ?  ?Problem: Activity: ?Goal: Risk for activity intolerance will decrease ?Outcome: Adequate for Discharge ?  ?Problem: Nutrition: ?Goal: Adequate nutrition will be maintained ?Outcome: Adequate for Discharge ?  ?Problem: Coping: ?Goal: Level of anxiety will decrease ?Outcome: Adequate for Discharge ?  ?Problem: Elimination: ?Goal: Will not experience complications related to bowel motility ?Outcome: Adequate for Discharge ?Goal: Will not experience complications related to urinary retention ?Outcome: Adequate for Discharge ?  ?Problem: Pain Managment: ?Goal: General experience of comfort will improve ?Outcome: Adequate for Discharge ?  ?Problem: Safety: ?Goal: Ability to remain free from injury will improve ?Outcome: Adequate for Discharge ?  ?Problem: Skin Integrity: ?Goal: Risk for impaired skin integrity will decrease ?Outcome: Adequate for Discharge ?  ? ?Haydee Salter, RN ? ?

## 2021-10-19 NOTE — Anesthesia Postprocedure Evaluation (Signed)
Anesthesia Post Note ? ?Patient: Mikayla Harrison ? ?Procedure(s) Performed: XI ROBOTIC ASSISTED TOTAL HYSTERECTOMY WITH BILATERAL SALPINGO OOPHORECTOMY ?SENTINEL NODE BIOPSY ?CYSTOSCOPY ? ?  ? ?Patient location during evaluation: Other ?Anesthesia Type: General ?Level of consciousness: awake and alert ?Pain management: pain level controlled ?Vital Signs Assessment: post-procedure vital signs reviewed and stable ?Respiratory status: spontaneous breathing, nonlabored ventilation and respiratory function stable ?Cardiovascular status: blood pressure returned to baseline and stable ?Postop Assessment: no apparent nausea or vomiting ?Anesthetic complications: no ? ? ?No notable events documented. ? ?Last Vitals:  ?Vitals:  ? 10/19/21 0034 10/19/21 0432  ?BP: 126/67 117/63  ?Pulse: 64 61  ?Resp: 16 16  ?Temp: (!) 36.4 ?C 36.7 ?C  ?SpO2: 99% 100%  ?  ?Last Pain:  ?Vitals:  ? 10/18/21 2030  ?TempSrc:   ?PainSc: Asleep  ? ? ?  ?  ?  ?  ?  ?  ? ?Ori Kreiter,W. EDMOND ? ? ? ? ?

## 2021-10-20 ENCOUNTER — Telehealth: Payer: Self-pay | Admitting: *Deleted

## 2021-10-20 ENCOUNTER — Encounter (HOSPITAL_COMMUNITY): Payer: Self-pay | Admitting: Gynecologic Oncology

## 2021-10-20 NOTE — Telephone Encounter (Signed)
Spoke with Mikayla Harrison this morning. She states she is eating, drinking and urinating well. She has not had a BM yet but is passing gas. She is taking senokot as prescribed and encouraged her to drink plenty of water. Pt asked about an enema. Educated pt on miralax and pt stated she has some and she'll take it. She denies fever or chills. Incisions are dry and intact. She rates her pain 8/10. Her pain is controlled with Tylenol. She stated she does not like to take any extra medication if it's not absolutely necessary.  Educated her on the use of Tramadol and the benefits of decrease pain as part of healing. She stated she is fine with a pain of an 8/10 and its tolerable for her.  ? ?Instructed to call office with any fever, chills, purulent drainage, uncontrolled pain or any other questions or concerns. Patient verbalizes understanding.  ? ?Pt aware of post op appointments as well as the office number [phone] and after hours number [phone] to call if she has any questions or concerns  ?

## 2021-10-24 ENCOUNTER — Ambulatory Visit: Payer: Medicare PPO | Admitting: Nurse Practitioner

## 2021-10-24 LAB — SURGICAL PATHOLOGY

## 2021-10-25 ENCOUNTER — Other Ambulatory Visit: Payer: Self-pay | Admitting: Nurse Practitioner

## 2021-10-25 DIAGNOSIS — I10 Essential (primary) hypertension: Secondary | ICD-10-CM

## 2021-10-30 ENCOUNTER — Ambulatory Visit: Payer: Medicare PPO | Admitting: Nurse Practitioner

## 2021-10-30 ENCOUNTER — Other Ambulatory Visit: Payer: Self-pay | Admitting: Oncology

## 2021-10-30 ENCOUNTER — Encounter: Payer: Self-pay | Admitting: Oncology

## 2021-10-30 NOTE — Progress Notes (Signed)
Gynecologic Oncology Multi-Disciplinary Disposition Conference Note  Date of the Conference: 10/30/2021  Patient Name: Mikayla Harrison  Referring Provider: Dr. Quincy Simmonds Primary GYN Oncologist: Dr. 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 Disposition is to EBRT with brachytherapy or EBRT with carbo/Taxol or brachytherapy alone.   This Multidisciplinary conference took place involving physicians from [place], Medical Oncology, Radiation Oncology, Pathology, Radiology along with the Gynecologic Oncology Nurse Practitioner and Gynecologic Oncology Nurse Navigator.  Comprehensive assessment of the patient's malignancy, staging, need for surgery, chemotherapy, radiation therapy, and need for further testing were reviewed. Supportive measures, both inpatient and following discharge were also discussed. The recommended plan of care is documented. Greater than 35 minutes were spent correlating and coordinating this patient's care.

## 2021-10-31 NOTE — Progress Notes (Unsigned)
3 MONTH FOLLOW UP   Assessment:     Essential hypertension - continue medications, DASH diet, exercise and monitor at home. Call if greater than 130/80.  -check BP in the morning and at 9 oclock- hold medication if BP is running 120/80 or less - CBC, CMP  Hyperlipidemia At goal without medications at this time;  Continue low cholesterol diet and exercise.  - lipid panel  Abnormal glucose Discussed disease and risks Discussed diet/exercise, weight management  Check A1c  CKD Stage III (GFR 56 ml/min) Increase fluids, avoid NSAIDS, monitor sugars, will monitor - CMP/GFR  Hypothyroidism, unspecified hypothyroidism type -check TSH level, continue medications the same, reminded to take on an empty stomach 30-37mns before food.    Diverticulosis of intestine without bleeding, unspecified intestinal tract location Controlled, diet   Raynaud phenomenon Declines meds at this time  Overweight Long discussion about weight loss, diet, and exercise Recommended diet heavy in fruits and veggies and low in animal meats, cheeses, and dairy products, appropriate calorie intake Discussed appropriate weight for height Follow up at next visit   Edema of lower extremities Encourage compression socks and elevating feet above heart level as much as possible Limit salt intake and push fluids   Vitamin D deficiency Continue supplementation to maintain goal of 70-100 Taking Vitamin D 2,000 IU daily Check vitamin D level  Medication management Continued   Further disposition pending results if labs check today. Discussed med's effects and SE's.   Over 30 minutes of face to face interview, exam, counseling, chart review, and critical decision making was performed.    Future Appointments  Date Time Provider [place] 11/03/2021  4:50 PM TLafonda Mosses MD [facility] None  11/10/2021  9:45 AM TLafonda Mosses MD [facility] None  02/13/2022  3:00 PM MUnk Pinto MD  [facility] None  06/19/2022 11:00 AM MMagda Bernheim NP [facility] QuintNone        Subjective:   Mikayla BELKNAPis a 88y.o. female who presents for Medicare Annual Wellness Visit and 3 month follow up on hypertension, prediabetes, hyperlipidemia, vitamin D def.    Plantar fascitis and she has bone spurs on the left foot.  She has been to Foot specialist, Dr WEarleen Newport She has used Gabapentin but the medication makes her sick and can not use. Dr LMardelle Mattehas put shots in her knees for arthritis. She has neuropathy in her legs. She is using lotion with aloe that is helping.   She had robot assisted total hysterectomy with BSO and sentinel node biopsy.  Follows up on 11/03/21 with Dr. TBerline Lopes She is doing very well, no pain. Uses Tylenol if needs anything.   Continues to have swelling in her right lower leg. She has a history of CREST syndrome, may need to follow up with rheumatology.   She lives by herself, has a lot of anxiety at night, has clorazepate if needed, takes 1/2-1/4 tab occasionally.    BMI is Body mass index is 24.18 kg/m., she has not been working on diet and exercise. Wt Readings from Last 3 Encounters:  11/01/21 149 lb 12.8 oz (67.9 kg)  10/18/21 149 lb 0.5 oz (67.6 kg)  10/10/21 149 lb (67.6 kg)   Her blood pressure has been controlled at home 110-120's/80's, today their BP is BP: (!) 126/50.  She is currently on Amlodipine 2.5 mg  daily, benazepril 40 mg QD and Atenolol 12.5 mg if BP is elevated > 140/90 BP Readings from Last 3 Encounters:  11/01/21 (!) 126/50  10/19/21 122/60  10/10/21 (!) 141/80   She was switched from ACE to ARB due to cough however she has not switched and states cough got better with ABX/prednisone.    She does not workout but stays active around the house. She denies chest pain, shortness of breath, dizziness.   She is not on cholesterol medication and denies myalgias. Her cholesterol is at goal. The cholesterol last visit was:   Lab Results  Component  Value Date   CHOL 142 02/09/2021   HDL 40 (L) 02/09/2021   LDLCALC 83 02/09/2021   TRIG 91 02/09/2021   CHOLHDL 3.6 02/09/2021    She has CKD stage III due to HTN. Lab Results  Component Value Date   GFRNONAA >60 10/19/2021   She has been working on diet and exercise for prediabetes, and denies paresthesia of the feet, polydipsia, polyuria and visual disturbances. She is on bASA.  Last A1C in the office was:  Lab Results  Component Value Date   HGBA1C 5.9 (H) 09/12/2021   Patient is on Vitamin D supplement. Lab Results  Component Value Date   VD25OH 113 (H) 06/19/2021     She is on thyroid medication. Her medication was not changed last visit.   Lab Results  Component Value Date   TSH 0.83 06/19/2021   Patient was held overnight for observation in May 2018 with a (-) Head CT, MRI & carotid dopplers and she was discharged home on LD bASA for suspected possible TIA.  Medication Review Current Outpatient Medications on File Prior to Visit  Medication Sig   acetaminophen (TYLENOL) 325 MG tablet Take 325 mg by mouth every 6 (six) hours as needed.   amLODipine (NORVASC) 10 MG tablet TAKE 1/2 TO 1 (ONE-HALF TO ONE) TABLET BY MOUTH ONCE DAILY FOR BLOOD PRESSURE (Patient taking differently: Take 2.5 mg by mouth daily as needed (elevated blood pressure.). Pt checks b/p in am and pm if blood pressure is elevated at nite she will take)   Ascorbic Acid (VITAMIN C PO) Take 1,000 mg by mouth daily.   atenolol (TENORMIN) 50 MG tablet Take  1 tablet  Daily  for BP                         /       Take 1 tablet by mouth once daily for blood pressure (Patient taking differently: Take 12.5 mg by mouth daily as needed (elevated blood pressure.). Pt checks blood pressure am and pm if blood pressure Is elevated in the pm pt will take)   benazepril (LOTENSIN) 40 MG tablet Take 1 tablet by mouth once daily for blood pressure   Cholecalciferol (VITAMIN D3) 125 MCG (5000 UT) TABS Take 5,000 Units by mouth  daily.   clorazepate (TRANXENE) 7.5 MG tablet Take 1/2 - 1 tablet 2 - 3 x /day ONLY if needed for Anxiety Attack &  limit to 5 days /week to avoid Addiction & Dementia (Patient taking differently: Take 1.875 mg by mouth daily as needed for anxiety. Take 1/2 - 1 tablet 2 - 3 x /day ONLY if needed for Anxiety Attack &  limit to 5 days /week to avoid Addiction & Dementia)   clotrimazole (LOTRIMIN) 1 % cream Apply 1 application topically 2 (two) times daily.   Cyanocobalamin (VITAMIN B-12) 2500 MCG SUBL Take 2,500 mcg by mouth daily.   Magnesium 250 MG TABS Take 1 tablet by mouth daily.  meclizine (ANTIVERT) 25 MG tablet TAKE 1/2 TO 1 (ONE-HALF TO ONE) TABLET BY MOUTH THREE TIMES DAILY FOR MOTION SICKNESS AND DIZINESS   senna-docusate (SENOKOT-S) 8.6-50 MG tablet Take 2 tablets by mouth at bedtime. For AFTER surgery, do not take if having diarrhea   SYNTHROID 150 MCG tablet Take  1 tablet  Daily  on an empty stomach with only water for 30 minutes & no Antacid meds, Calcium or Magnesium for 4 hours & avoid Biotin   zinc gluconate 50 MG tablet Take 50 mg by mouth daily.   azithromycin (ZITHROMAX) 250 MG tablet Take 2 tablets (500 mg) on Day 1 followed by 1 tablet  (250 mg) daily until complete. (Patient not taking: Reported on 11/01/2021)   benzonatate (TESSALON PERLES) 100 MG capsule Take 1 capsule (100 mg total) by mouth 3 (three) times daily as needed for cough. (Patient not taking: Reported on 11/01/2021)   traMADol (ULTRAM) 50 MG tablet Take 1 tablet (50 mg total) by mouth every 6 (six) hours as needed for severe pain. For AFTER surgery only, do not take and drive (Patient not taking: Reported on 11/01/2021)   No current facility-administered medications on file prior to visit.    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[place])    Endometrial cancer ([place]) 09/08/2021   Edema of left lower extremity 09/08/2021   [place] pain  08/04/2020   Enrolled in chronic care management 11/14/2018   Normocytic anemia 06/10/2018   CREST syndrome ([place]) 02/12/2018   Abnormal glucose 10/10/2017   FHx: heart disease 10/10/2017   History of TIA (transient ischemic attack) 11/06/2016   Raynaud phenomenon 08/01/2015   Overweight (BMI 25.0-29.9) 03/22/2015   Unifocal PVCs 11/02/2014   CKD (chronic kidney disease) stage 3, GFR 30-59 ml/min (HCC) 03/16/2014   Hyperlipidemia, mixed 10/05/2013   Screening for colorectal cancer 10/05/2013   Vitamin D deficiency 10/05/2013   Prediabetes    Diverticulosis    Essential hypertension 01/14/2012   Hypothyroidism 01/14/2012    Screening Tests Immunization History  Administered Date(s) Administered   DT (Pediatric) 03/22/2015   Influenza Split 03/04/2013   Influenza, High Dose Seasonal PF 03/16/2014, 03/22/2015, 02/15/2016, 04/23/2017, 04/30/2018, 04/08/2019, 06/19/2021   Pneumococcal Polysaccharide-23 03/16/2014   Td 10/10/2017    Names of Other Physician/Practitioners you currently use: 1. [facility]- here for primary care 2. Dr. Delman Cheadle, eye doctor, 2021 3. , dentist, no- has dentures  Patient Care Team: [name], MD as PCP - General (Internal Medicine)  Allergies Allergies  Allergen Reactions   Sulfa Antibiotics Other (See Comments)   Codeine Nausea Only   Prednisone Nausea And Vomiting, Nausea Only and Other (See Comments)   Asa [Aspirin] Anxiety and Other (See Comments)    Take '81mg'$  cannot take '325mg'$  gets nervous    SURGICAL HISTORY She  has a past surgical history that includes Cataract extraction; Vericose vein (1965); [place]; Eye surgery; Robotic assisted total hysterectomy with bilateral salpingo oophorectomy (N/A, 10/18/2021); Sentinel node biopsy (N/A, 10/18/2021); and Cystoscopy (10/18/2021). FAMILY HISTORY Her family history includes Colon polyps in her sister; Diabetes in her sister; Heart disease in her  brother, father, and mother; Hypertension in her son; Kidney cancer in her nephew; Stroke in her brother, mother, and sister. SOCIAL HISTORY She  reports that she quit smoking about 56 years ago. Her smoking use included cigarettes. She has a 15.00 pack-year smoking history. She has  never used smokeless tobacco. She reports that she does not drink alcohol and does not use drugs.    Objective:   Blood pressure (!) 126/50, pulse 83, temperature (!) 97.5 F (36.4 C), weight 149 lb 12.8 oz (67.9 kg), SpO2 98 %. Body mass index is 24.18 kg/m.  General Appearance: Well nourished, in no apparent distress. Eyes: PERRLA, EOMs, conjunctiva no swelling or erythema Sinuses: No Frontal/maxillary tenderness ENT/Mouth: Ext aud canals clear, Tms fluid Right ear, no drainage or erythema. No erythema, swelling, or exudate on post pharynx.  Tonsils not swollen or erythematous. Hearing normal.  Neck: Supple, thyroid normal.  Respiratory: Respiratory effort normal, BS equal bilaterally without rales, rhonchi, wheezing or stridor.  Cardio: RRR with no MRGs. Brisk peripheral pulses.2+ pitting edema of LLE, 1+ nonpitting edema RLE Abdomen: Soft, + BS.  Non tender, no guarding, rebound, hernias, masses. Lymphatics: Non tender without lymphadenopathy.  Musculoskeletal: Full ROM, 5/5 strength, Normal gait Skin: Warm, dry .Incisions are healing well on abdomen- no s/s of infection Neuro: Cranial nerves intact. No cerebellar symptoms.  Psych: Awake and oriented X 3, normal affect, Insight and Judgment appropriate.     [name] ANP-C  [facility]  11/01/2021

## 2021-11-01 ENCOUNTER — Ambulatory Visit (INDEPENDENT_AMBULATORY_CARE_PROVIDER_SITE_OTHER): Payer: Medicare HMO | Admitting: Nurse Practitioner

## 2021-11-01 ENCOUNTER — Encounter: Payer: Self-pay | Admitting: Nurse Practitioner

## 2021-11-01 VITALS — BP 126/50 | HR 83 | Temp 97.5°F | Wt 149.8 lb

## 2021-11-01 DIAGNOSIS — E663 Overweight: Secondary | ICD-10-CM | POA: Diagnosis not present

## 2021-11-01 DIAGNOSIS — I7300 Raynaud's syndrome without gangrene: Secondary | ICD-10-CM | POA: Diagnosis not present

## 2021-11-01 DIAGNOSIS — R600 Localized edema: Secondary | ICD-10-CM

## 2021-11-01 DIAGNOSIS — E782 Mixed hyperlipidemia: Secondary | ICD-10-CM

## 2021-11-01 DIAGNOSIS — E039 Hypothyroidism, unspecified: Secondary | ICD-10-CM | POA: Diagnosis not present

## 2021-11-01 DIAGNOSIS — R7309 Other abnormal glucose: Secondary | ICD-10-CM

## 2021-11-01 DIAGNOSIS — E559 Vitamin D deficiency, unspecified: Secondary | ICD-10-CM

## 2021-11-01 DIAGNOSIS — N1832 Chronic kidney disease, stage 3b: Secondary | ICD-10-CM

## 2021-11-01 DIAGNOSIS — Z79899 Other long term (current) drug therapy: Secondary | ICD-10-CM

## 2021-11-01 DIAGNOSIS — I10 Essential (primary) hypertension: Secondary | ICD-10-CM | POA: Diagnosis not present

## 2021-11-01 DIAGNOSIS — K5790 Diverticulosis of intestine, part unspecified, without perforation or abscess without bleeding: Secondary | ICD-10-CM

## 2021-11-02 ENCOUNTER — Telehealth: Payer: Self-pay

## 2021-11-02 LAB — LIPID PANEL
Cholesterol: 152 mg/dL (ref <200)
HDL: 43 mg/dL — ABNORMAL LOW (ref >=50)
LDL Cholesterol (Calc): 89 mg/dL (calc)
Non-HDL Cholesterol (Calc): 109 mg/dL (calc) (ref <130)
Total CHOL/HDL Ratio: 3.5 (calc) (ref <5.0)
Triglycerides: 103 mg/dL (ref <150)

## 2021-11-02 LAB — CBC WITH DIFFERENTIAL/PLATELET
Absolute Monocytes: 679 cells/uL (ref 200–950)
Basophils Absolute: 39 cells/uL (ref 0–200)
Basophils Relative: 0.5 %
Eosinophils Absolute: 382 cells/uL (ref 15–500)
Eosinophils Relative: 4.9 %
HCT: 31 % — ABNORMAL LOW (ref 35.0–45.0)
Hemoglobin: 10 g/dL — ABNORMAL LOW (ref 11.7–15.5)
Lymphs Abs: 1685 cells/uL (ref 850–3900)
MCH: 27.5 pg (ref 27.0–33.0)
MCHC: 32.3 g/dL (ref 32.0–36.0)
MCV: 85.2 fL (ref 80.0–100.0)
MPV: 11.5 fL (ref 7.5–12.5)
Monocytes Relative: 8.7 %
Neutro Abs: 5015 cells/uL (ref 1500–7800)
Neutrophils Relative %: 64.3 %
Platelets: 287 10*3/uL (ref 140–400)
RBC: 3.64 10*6/uL — ABNORMAL LOW (ref 3.80–5.10)
RDW: 13.8 % (ref 11.0–15.0)
Total Lymphocyte: 21.6 %
WBC: 7.8 10*3/uL (ref 3.8–10.8)

## 2021-11-02 LAB — COMPLETE METABOLIC PANEL WITH GFR
AG Ratio: 1.2 (calc) (ref 1.0–2.5)
ALT: 9 U/L (ref 6–29)
AST: 19 U/L (ref 10–35)
Albumin: 3.7 g/dL (ref 3.6–5.1)
Alkaline phosphatase (APISO): 73 U/L (ref 37–153)
BUN/Creatinine Ratio: 18 (calc) (ref 6–22)
BUN: 18 mg/dL (ref 7–25)
CO2: 25 mmol/L (ref 20–32)
Calcium: 9.2 mg/dL (ref 8.6–10.4)
Chloride: 103 mmol/L (ref 98–110)
Creat: 1.02 mg/dL — ABNORMAL HIGH (ref 0.60–0.95)
Globulin: 3 g/dL (calc) (ref 1.9–3.7)
Glucose, Bld: 99 mg/dL (ref 65–99)
Potassium: 4.6 mmol/L (ref 3.5–5.3)
Sodium: 137 mmol/L (ref 135–146)
Total Bilirubin: 0.4 mg/dL (ref 0.2–1.2)
Total Protein: 6.7 g/dL (ref 6.1–8.1)
eGFR: 53 mL/min/{1.73_m2} — ABNORMAL LOW (ref >=60)

## 2021-11-02 LAB — HEMOGLOBIN A1C
Hgb A1c MFr Bld: 5.8 % of total Hgb — ABNORMAL HIGH (ref <5.7)
Mean Plasma Glucose: 120 mg/dL
eAG (mmol/L): 6.6 mmol/L

## 2021-11-02 LAB — TSH: TSH: 0.24 mIU/L — ABNORMAL LOW (ref 0.40–4.50)

## 2021-11-02 NOTE — Telephone Encounter (Signed)
error 

## 2021-11-03 ENCOUNTER — Inpatient Hospital Stay: Payer: Medicare HMO | Attending: Gynecologic Oncology | Admitting: Gynecologic Oncology

## 2021-11-03 ENCOUNTER — Encounter: Payer: Self-pay | Admitting: Gynecologic Oncology

## 2021-11-03 DIAGNOSIS — C55 Malignant neoplasm of uterus, part unspecified: Secondary | ICD-10-CM

## 2021-11-03 DIAGNOSIS — Z90722 Acquired absence of ovaries, bilateral: Secondary | ICD-10-CM

## 2021-11-03 DIAGNOSIS — C541 Malignant neoplasm of endometrium: Secondary | ICD-10-CM

## 2021-11-03 DIAGNOSIS — Z90710 Acquired absence of both cervix and uterus: Secondary | ICD-10-CM

## 2021-11-03 DIAGNOSIS — Z7189 Other specified counseling: Secondary | ICD-10-CM

## 2021-11-03 NOTE — Progress Notes (Signed)
Gynecologic Oncology Telehealth Consult Note: Gyn-Onc  I connected with [name] on 11/03/21 at  4:50 PM EDT by telephone and verified that I am speaking with the correct person using two identifiers.  I discussed the limitations, risks, security and privacy concerns of performing an evaluation and management service by telemedicine and the availability of in-person appointments. I also discussed with the patient that there may be a patient responsible charge related to this service. The patient expressed understanding and agreed to proceed.  Other persons participating in the visit and their role in the encounter: None.  Patient's location: Home Provider's location: [facility]  Reason for Visit: Follow-up after surgery, review of pathology  Treatment History: Oncology History Overview Note  MMR IHC - loss of MLH1 and PMS2   Endometrial cancer ([place])  08/21/2021 Initial Biopsy   Endometrial biopsy shows adenocarcinoma with mixed clear cell and endometrioid, low-grade.   09/04/2021 Imaging   CT A/P: IMPRESSION: 1. 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 No definitive findings to suggest metastatic disease to the chest, abdomen or pelvis. 2. Small left adrenal nodule, stable compared to prior study from 2015, considered benign (likely an adenoma). 3. Aortic atherosclerosis, in addition to 2 vessel coronary artery disease. 4. Additional incidental findings, as above.   09/08/2021 Initial Diagnosis   Endometrial cancer ([place])   10/18/2021 Surgery   Trh/BSO, bilateral SLN biopsy  Findings: On EUA, small mobile uterus. Normal upper abdominal survey on intra-abdominal entry. Normal omentum, small and large bowel. Significant diverticular disease. Normal adnexa. Uterus 10cm with some small subserosal fibroids. Atrophic adnexa. Mapping  successful to right obturator and left external iliac SLN. Significant bladder prolapse. No intra-abdominal or pelvic evidence of disease. Cystoscopy performed given some difficulty developing bladder flap; dome intact, bilateral ureteral orifices.    10/18/2021 Pathologic Stage   Stage IB mixed clear cell (15%) and grade 1 endometrioid (85%). MI 60% No LVI Negative Slns      Interval History: Patient reports overall doing very well.  She denies any significant pain since surgery.  Had vaginal spotting although this has stopped.  Denies any vaginal discharge.  Reports regular bowel and bladder function.  Past Medical/Surgical History: Past Medical History:  Diagnosis Date   Anemia    Anxiety    Arthritis    CKD (chronic kidney disease) stage 3, GFR 30-59 ml/min (HCC)    Colon polyp    Diverticulosis    Endometrial cancer ([place])    Hypertension    Hypothyroidism    Iron deficiency    Peripheral neuropathy    in bilateral legs   Plantar fasciitis    Pre-diabetes    Prediabetes    Pt denies   Small bowel obstruction ([place])     Past Surgical History:  Procedure Laterality Date   CATARACT EXTRACTION     CYSTOSCOPY  10/18/2021   Procedure: CYSTOSCOPY;  Surgeon: Lafonda Mosses, MD;  Location: [facility];  Service: Gynecology;;   EYE SURGERY     MACULAR TEAR     ROBOTIC ASSISTED TOTAL HYSTERECTOMY WITH BILATERAL SALPINGO OOPHERECTOMY N/A 10/18/2021   Procedure: XI ROBOTIC ASSISTED TOTAL HYSTERECTOMY WITH BILATERAL SALPINGO OOPHORECTOMY;  Surgeon: Lafonda Mosses, MD;  Location: [facility];  Service: Gynecology;  Laterality: N/A;   SENTINEL NODE BIOPSY N/A 10/18/2021   Procedure: SENTINEL NODE BIOPSY;  Surgeon: Lafonda Mosses, MD;  Location: [facility];  Service: Gynecology;  Laterality: N/A;   Vericose vein  1965    Family History  Problem Relation Age of Onset   Heart disease Mother    Stroke Mother    Heart disease Father    Colon polyps Sister    Diabetes Sister     Stroke Sister    Heart disease Brother    Stroke Brother    Hypertension Son    Kidney cancer Nephew     Social History   Socioeconomic History   Marital status: Widowed    Spouse name: Not on file   Number of children: Not on file   Years of education: Not on file   Highest education level: Not on file  Occupational History   Not on file  Tobacco Use   Smoking status: Former    Packs/day: 1.00    Years: 15.00    Pack years: 15.00    Types: Cigarettes    Quit date: 06/11/1965    Years since quitting: 56.4   Smokeless tobacco: Never  Vaping Use   Vaping Use: Never used  Substance and Sexual Activity   Alcohol use: No   Drug use: No   Sexual activity: Not Currently  Other Topics Concern   Not on file  Social History Narrative   Not on file   Social Determinants of Health   Financial Resource Strain: Not on file  Food Insecurity: Not on file  Transportation Needs: Not on file  Physical Activity: Not on file  Stress: Not on file  Social Connections: Not on file    Current Medications:  Current Outpatient Medications:    acetaminophen (TYLENOL) 325 MG tablet, Take 325 mg by mouth every 6 (six) hours as needed., Disp: , Rfl:    amLODipine (NORVASC) 10 MG tablet, TAKE 1/2 TO 1 (ONE-HALF TO ONE) TABLET BY MOUTH ONCE DAILY FOR BLOOD PRESSURE (Patient taking differently: Take 2.5 mg by mouth daily as needed (elevated blood pressure.). Pt checks b/p in am and pm if blood pressure is elevated at nite she will take), Disp: 90 tablet, Rfl: 1   Ascorbic Acid (VITAMIN C PO), Take 1,000 mg by mouth daily., Disp: , Rfl:    atenolol (TENORMIN) 50 MG tablet, Take  1 tablet  Daily  for BP                         /       Take 1 tablet by mouth once daily for blood pressure (Patient taking differently: Take 12.5 mg by mouth daily as needed (elevated blood pressure.). Pt checks blood pressure am and pm if blood pressure Is elevated in the pm pt will take), Disp: 90 tablet, Rfl: 3    benazepril (LOTENSIN) 40 MG tablet, Take 1 tablet by mouth once daily for blood pressure, Disp: 90 tablet, Rfl: 0   Cholecalciferol (VITAMIN D3) 125 MCG (5000 UT) TABS, Take 5,000 Units by mouth daily., Disp: , Rfl:    clorazepate (TRANXENE) 7.5 MG tablet, Take 1/2 - 1 tablet 2 - 3 x /day ONLY if needed for Anxiety Attack &  limit to 5 days /week to avoid Addiction & Dementia (Patient taking differently: Take 1.875 mg by mouth daily as needed for anxiety. Take 1/2 - 1 tablet 2 - 3 x /day ONLY if needed for Anxiety Attack &  limit to 5 days /week to avoid Addiction & Dementia), Disp: 30 tablet, Rfl: 0  clotrimazole (LOTRIMIN) 1 % cream, Apply 1 application topically 2 (two) times daily., Disp: 30 g, Rfl: 0   Cyanocobalamin (VITAMIN B-12) 2500 MCG SUBL, Take 2,500 mcg by mouth daily., Disp: , Rfl:    Magnesium 250 MG TABS, Take 1 tablet by mouth daily., Disp: , Rfl:    meclizine (ANTIVERT) 25 MG tablet, TAKE 1/2 TO 1 (ONE-HALF TO ONE) TABLET BY MOUTH THREE TIMES DAILY FOR MOTION SICKNESS AND DIZINESS, Disp: 30 tablet, Rfl: 0   senna-docusate (SENOKOT-S) 8.6-50 MG tablet, Take 2 tablets by mouth at bedtime. For AFTER surgery, do not take if having diarrhea, Disp: 30 tablet, Rfl: 0   SYNTHROID 150 MCG tablet, Take  1 tablet  Daily  on an empty stomach with only water for 30 minutes & no Antacid meds, Calcium or Magnesium for 4 hours & avoid Biotin, Disp: 90 tablet, Rfl: 3   zinc gluconate 50 MG tablet, Take 50 mg by mouth daily., Disp: , Rfl:   Review of Symptoms: Pertinent positives as per HPI.  Physical Exam: There were no vitals taken for this visit. Deferred given limitations of phone visit.  Laboratory & Radiologic Studies: None new, see pathology above  Assessment & Plan: Mikayla Harrison is a 88 y.o. 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  Patient is overall doing very well and meeting postoperative  milestones.  Discussed continued expectations and restrictions.  Reviewed her pathology in detail.  The majority of her tumor is a low-grade endometrioid type, but there is a small component that is high grade (clear cell).  We discussed her case at tumor board.  We discussed various options for treatment ranging from vaginal brachytherapy only, to pelvic radiation, to chemotherapy with vaginal brachytherapy.  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  Patients with clear cell cancer made up a small subset of this group, but those with MMR deficient tumors and a 5-year recurrence free survival that was not statistically different between chemotherapy with radiation versus radiation alone.  The patient is somewhat hesitant about chemotherapy.  She is amenable to meeting both with her medical oncologist and radiation oncologist to hear more about treatment options before making a decision.  I recommend that at minimum, even if she does not want pelvic radiation or chemotherapy, that she consider vaginal brachytherapy.  I messaged our nurse navigator to help coordinate scheduling a visit with radiation oncology and medical oncology.  I discussed the assessment and treatment plan with the patient. The patient was provided with an opportunity to ask questions and all were answered. The patient agreed with the plan and demonstrated an understanding of the instructions.   The patient was advised to call back or see an in-person evaluation if the symptoms worsen or if the condition fails to improve as anticipated.   18 minutes of total time was spent for this patient encounter, including preparation, face-to-face counseling with the patient and coordination of care, and documentation of the encounter.   Jeral Pinch, MD  Division of Gynecologic Oncology   Department of Obstetrics and Gynecology  [facility]

## 2021-11-07 ENCOUNTER — Telehealth: Payer: Self-pay | Admitting: Radiation Oncology

## 2021-11-07 ENCOUNTER — Other Ambulatory Visit: Payer: Self-pay | Admitting: Oncology

## 2021-11-07 ENCOUNTER — Telehealth: Payer: Self-pay | Admitting: Oncology

## 2021-11-07 DIAGNOSIS — C541 Malignant neoplasm of endometrium: Secondary | ICD-10-CM

## 2021-11-07 NOTE — Progress Notes (Signed)
Referral placed to Dr. Sondra Come to discuss radiation.

## 2021-11-07 NOTE — Telephone Encounter (Signed)
5/30 @ 9:46 am spoke to patient.  She would like to hold off to schedule any appts right now with Dr. Sondra Come till she speak to Dr. Berline Lopes on 6/1.  Will defer ref till hear back from patient.

## 2021-11-07 NOTE — Telephone Encounter (Signed)
[facility] and discussed scheduling appointments with Dr. Alvy Bimler and Dr. Sondra Come.  She said she is still thinking about it and wants to talk to Dr. Berline Lopes again before making a decision.  Provided her with my contact information if she would like to schedule appointments.  Also discussed genetic counseling and she is interested in an appointment.  Scheduled appointment for 12/07/21 at 2:00.  She verbalized understanding and agreement.

## 2021-11-09 NOTE — Progress Notes (Unsigned)
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   09/04/2021 Imaging   CT A/P: IMPRESSION: 1. 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 No definitive findings to suggest metastatic disease to the chest, abdomen or pelvis. 2. Small left adrenal nodule, stable compared to prior study from 2015, considered benign (likely an adenoma). 3. Aortic atherosclerosis, in addition to 2 vessel coronary artery disease. 4. Additional incidental findings, as above.   09/08/2021 Initial Diagnosis   Endometrial cancer ([place])   10/18/2021 Surgery   Trh/BSO, bilateral SLN biopsy  Findings: On EUA, small mobile uterus. Normal upper abdominal survey on intra-abdominal entry. Normal omentum, small and large bowel. Significant diverticular disease. Normal adnexa. Uterus 10cm with some small subserosal fibroids. Atrophic adnexa. Mapping successful to right obturator and left external iliac SLN. Significant bladder prolapse. No intra-abdominal or pelvic evidence of disease. Cystoscopy performed given some difficulty developing bladder flap; dome intact, bilateral ureteral orifices.    10/18/2021 Pathologic Stage   Stage IB mixed clear cell (15%) and grade 1 endometrioid (85%). MI 60% No LVI Negative Slns      Interval History: ***  Past Medical/Surgical History: Past Medical History:  Diagnosis Date   Anemia    Anxiety    Arthritis    CKD (chronic kidney disease) stage 3, GFR 30-59 ml/min (HCC)    Colon polyp     Diverticulosis    Endometrial cancer ([place])    Hypertension    Hypothyroidism    Iron deficiency    Peripheral neuropathy    in bilateral legs   Plantar fasciitis    Pre-diabetes    Prediabetes    Pt denies   Small bowel obstruction ([place])     Past Surgical History:  Procedure Laterality Date   CATARACT EXTRACTION     CYSTOSCOPY  10/18/2021   Procedure: CYSTOSCOPY;  Surgeon: Lafonda Mosses, MD;  Location: [facility];  Service: Gynecology;;   EYE SURGERY     MACULAR TEAR     ROBOTIC ASSISTED TOTAL HYSTERECTOMY WITH BILATERAL SALPINGO OOPHERECTOMY N/A 10/18/2021   Procedure: XI ROBOTIC ASSISTED TOTAL HYSTERECTOMY WITH BILATERAL SALPINGO OOPHORECTOMY;  Surgeon: Lafonda Mosses, MD;  Location: [facility];  Service: Gynecology;  Laterality: N/A;   SENTINEL NODE BIOPSY N/A 10/18/2021   Procedure: SENTINEL NODE BIOPSY;  Surgeon: Lafonda Mosses, MD;  Location: [facility];  Service: Gynecology;  Laterality: N/A;   Vericose vein  1965    Family History  Problem Relation Age of Onset   Heart disease Mother    Stroke Mother    Heart disease Father    Colon polyps Sister    Diabetes Sister    Stroke Sister    Heart disease Brother    Stroke Brother    Hypertension Son    Kidney cancer Nephew     Social History   Socioeconomic History   Marital status: Widowed    Spouse name: Not on file   Number of children: Not on file   Years of education: Not on file   Highest education level: Not on file  Occupational  History   Not on file  Tobacco Use   Smoking status: Former    Packs/day: 1.00    Years: 15.00    Pack years: 15.00    Types: Cigarettes    Quit date: 06/11/1965    Years since quitting: 56.4   Smokeless tobacco: Never  Vaping Use   Vaping Use: Never used  Substance and Sexual Activity   Alcohol use: No   Drug use: No   Sexual activity: Not Currently  Other Topics Concern   Not on file  Social History Narrative   Not on file   Social Determinants of Health    Financial Resource Strain: Not on file  Food Insecurity: Not on file  Transportation Needs: Not on file  Physical Activity: Not on file  Stress: Not on file  Social Connections: Not on file    Current Medications:  Current Outpatient Medications:    acetaminophen (TYLENOL) 325 MG tablet, Take 325 mg by mouth every 6 (six) hours as needed., Disp: , Rfl:    amLODipine (NORVASC) 10 MG tablet, TAKE 1/2 TO 1 (ONE-HALF TO ONE) TABLET BY MOUTH ONCE DAILY FOR BLOOD PRESSURE (Patient taking differently: Take 2.5 mg by mouth daily as needed (elevated blood pressure.). Pt checks b/p in am and pm if blood pressure is elevated at nite she will take), Disp: 90 tablet, Rfl: 1   Ascorbic Acid (VITAMIN C PO), Take 1,000 mg by mouth daily., Disp: , Rfl:    atenolol (TENORMIN) 50 MG tablet, Take  1 tablet  Daily  for BP                         /       Take 1 tablet by mouth once daily for blood pressure (Patient taking differently: Take 12.5 mg by mouth daily as needed (elevated blood pressure.). Pt checks blood pressure am and pm if blood pressure Is elevated in the pm pt will take), Disp: 90 tablet, Rfl: 3   benazepril (LOTENSIN) 40 MG tablet, Take 1 tablet by mouth once daily for blood pressure, Disp: 90 tablet, Rfl: 0   Cholecalciferol (VITAMIN D3) 125 MCG (5000 UT) TABS, Take 5,000 Units by mouth daily., Disp: , Rfl:    clorazepate (TRANXENE) 7.5 MG tablet, Take 1/2 - 1 tablet 2 - 3 x /day ONLY if needed for Anxiety Attack &  limit to 5 days /week to avoid Addiction & Dementia (Patient taking differently: Take 1.875 mg by mouth daily as needed for anxiety. Take 1/2 - 1 tablet 2 - 3 x /day ONLY if needed for Anxiety Attack &  limit to 5 days /week to avoid Addiction & Dementia), Disp: 30 tablet, Rfl: 0   clotrimazole (LOTRIMIN) 1 % cream, Apply 1 application topically 2 (two) times daily., Disp: 30 g, Rfl: 0   Cyanocobalamin (VITAMIN B-12) 2500 MCG SUBL, Take 2,500 mcg by mouth daily., Disp: , Rfl:     Magnesium 250 MG TABS, Take 1 tablet by mouth daily., Disp: , Rfl:    meclizine (ANTIVERT) 25 MG tablet, TAKE 1/2 TO 1 (ONE-HALF TO ONE) TABLET BY MOUTH THREE TIMES DAILY FOR MOTION SICKNESS AND DIZINESS, Disp: 30 tablet, Rfl: 0   senna-docusate (SENOKOT-S) 8.6-50 MG tablet, Take 2 tablets by mouth at bedtime. For AFTER surgery, do not take if having diarrhea, Disp: 30 tablet, Rfl: 0   SYNTHROID 150 MCG tablet, Take  1 tablet  Daily  on an empty stomach with  only water for 30 minutes & no Antacid meds, Calcium or Magnesium for 4 hours & avoid Biotin, Disp: 90 tablet, Rfl: 3   zinc gluconate 50 MG tablet, Take 50 mg by mouth daily., Disp: , Rfl:   Review of Systems: Denies appetite changes, fevers, chills, fatigue, unexplained weight changes. Denies hearing loss, neck lumps or masses, mouth sores, ringing in ears or voice changes. Denies cough or wheezing.  Denies shortness of breath. Denies chest pain or palpitations. Denies leg swelling. Denies abdominal distention, pain, blood in stools, constipation, diarrhea, nausea, vomiting, or early satiety. Denies pain with intercourse, dysuria, frequency, hematuria or incontinence. Denies hot flashes, pelvic pain, vaginal bleeding or vaginal discharge.   Denies joint pain, back pain or muscle pain/cramps. Denies itching, rash, or wounds. Denies dizziness, headaches, numbness or seizures. Denies swollen lymph nodes or glands, denies easy bruising or bleeding. Denies anxiety, depression, confusion, or decreased concentration.  Physical Exam: There were no vitals taken for this visit. General: ***Alert, oriented, no acute distress. HEENT: ***Posterior oropharynx clear, sclera anicteric. Chest: ***Clear to auscultation bilaterally.  ***Port site clean. Cardiovascular: ***Regular rate and rhythm, no murmurs. Abdomen: ***Obese, soft, nontender.  Normoactive bowel sounds.  No masses or hepatosplenomegaly appreciated.  ***Well-healed scar. Extremities:  ***Grossly normal range of motion.  Warm, well perfused.  No edema bilaterally. Skin: ***No rashes or lesions noted. Lymphatics: ***No cervical, supraclavicular, or inguinal adenopathy. GU: Normal appearing external genitalia without erythema, excoriation, or lesions.  Speculum exam reveals ***.  Bimanual exam reveals ***.  ***Rectovaginal exam  confirms ___.  Laboratory & Radiologic Studies: ***  Assessment & Plan: Mikayla Harrison is a 88 y.o. woman with Stage IB (IIC by 2023 staging) mixed low-grade endometrioid and clear cell who presents for follow-up after surgery. MSI-H, MMR abn, MLH1 promoter hypermethylation absent.    Patient is overall doing very well and meeting postoperative milestones.  Discussed continued expectations and restrictions.   Reviewed her pathology in detail.  The majority of her tumor is a low-grade endometrioid type, but there is a small component that is high grade (clear cell).  We discussed her case at tumor board.  We discussed various options for treatment ranging from vaginal brachytherapy only, to pelvic radiation, to chemotherapy with vaginal brachytherapy.  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  Patients with clear cell cancer made up a small subset of this group, but those with MMR deficient tumors and a 5-year recurrence free survival that was not statistically different between chemotherapy with radiation versus radiation alone.   The patient is somewhat hesitant about chemotherapy.  She is amenable to meeting both with her medical oncologist and radiation oncologist to hear more about treatment options before making a decision.  I recommend that at minimum, even if she does not want pelvic radiation or chemotherapy, that she consider vaginal brachytherapy.  *** minutes of total time was spent for this patient  encounter, including preparation, face-to-face counseling with the patient and coordination of care, and documentation of the encounter.  Jeral Pinch, MD  Division of Gynecologic Oncology  Department of Obstetrics and Gynecology  [facility]

## 2021-11-10 ENCOUNTER — Inpatient Hospital Stay: Payer: Medicare HMO | Admitting: Gynecologic Oncology

## 2021-11-10 DIAGNOSIS — C541 Malignant neoplasm of endometrium: Secondary | ICD-10-CM

## 2021-11-13 ENCOUNTER — Encounter: Payer: Self-pay | Admitting: Gynecologic Oncology

## 2021-11-15 ENCOUNTER — Telehealth: Payer: Self-pay | Admitting: *Deleted

## 2021-11-15 NOTE — Telephone Encounter (Signed)
Levada Dy from radiation called and stated "The patient wishes to wait to schedule an appt with Dr Sondra Come until after her appt with Dr Berline Lopes." Explained that the message would be forwarded to Dr Berline Lopes

## 2021-11-17 ENCOUNTER — Encounter: Payer: Self-pay | Admitting: Gynecologic Oncology

## 2021-11-17 ENCOUNTER — Inpatient Hospital Stay: Payer: Medicare HMO | Attending: Gynecologic Oncology | Admitting: Gynecologic Oncology

## 2021-11-17 ENCOUNTER — Inpatient Hospital Stay: Payer: Medicare HMO

## 2021-11-17 ENCOUNTER — Other Ambulatory Visit: Payer: Self-pay

## 2021-11-17 ENCOUNTER — Institutional Professional Consult (permissible substitution): Payer: Medicare HMO | Admitting: Radiation Oncology

## 2021-11-17 VITALS — BP 149/72 | HR 73 | Temp 97.8°F | Resp 16 | Ht 62.0 in | Wt 150.1 lb

## 2021-11-17 DIAGNOSIS — R600 Localized edema: Secondary | ICD-10-CM | POA: Diagnosis not present

## 2021-11-17 DIAGNOSIS — R300 Dysuria: Secondary | ICD-10-CM | POA: Insufficient documentation

## 2021-11-17 DIAGNOSIS — C541 Malignant neoplasm of endometrium: Secondary | ICD-10-CM | POA: Diagnosis not present

## 2021-11-17 DIAGNOSIS — Z90722 Acquired absence of ovaries, bilateral: Secondary | ICD-10-CM

## 2021-11-17 DIAGNOSIS — Z90710 Acquired absence of both cervix and uterus: Secondary | ICD-10-CM

## 2021-11-17 DIAGNOSIS — K5900 Constipation, unspecified: Secondary | ICD-10-CM | POA: Diagnosis not present

## 2021-11-17 DIAGNOSIS — Z79899 Other long term (current) drug therapy: Secondary | ICD-10-CM | POA: Insufficient documentation

## 2021-11-17 DIAGNOSIS — Z7189 Other specified counseling: Secondary | ICD-10-CM

## 2021-11-17 DIAGNOSIS — R350 Frequency of micturition: Secondary | ICD-10-CM

## 2021-11-17 DIAGNOSIS — Z87891 Personal history of nicotine dependence: Secondary | ICD-10-CM | POA: Insufficient documentation

## 2021-11-17 LAB — URINALYSIS, COMPLETE (UACMP) WITH MICROSCOPIC
Bilirubin Urine: NEGATIVE
Glucose, UA: NEGATIVE mg/dL
Ketones, ur: NEGATIVE mg/dL
Nitrite: NEGATIVE
Protein, ur: 30 mg/dL — AB
RBC / HPF: >50 RBC/hpf — ABNORMAL HIGH (ref 0–5)
Specific Gravity, Urine: 1.008 (ref 1.005–1.030)
WBC, UA: >50 WBC/hpf — ABNORMAL HIGH (ref 0–5)
pH: 5 (ref 5.0–8.0)

## 2021-11-17 NOTE — Patient Instructions (Addendum)
It was good to see you today.  You are healing well from surgery.  Remember, no heavy lifting for 6 weeks and nothing in the vagina for at least 8 weeks.  We will have a phone visit in a couple of weeks after you have met with our radiation doctor and our medical oncologist.  This will be to help define what the plan is for treatment after surgery.  Our office will give you a call with your appointments with Dr. Sondra Come in Radiation and Dr. Alvy Bimler with Medical Oncology.

## 2021-11-17 NOTE — Progress Notes (Signed)
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   09/04/2021 Imaging   CT A/P: IMPRESSION: 1. 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 No definitive findings to suggest metastatic disease to the chest, abdomen or pelvis. 2. Small left adrenal nodule, stable compared to prior study from 2015, considered benign (likely an adenoma). 3. Aortic atherosclerosis, in addition to 2 vessel coronary artery disease. 4. Additional incidental findings, as above.   09/08/2021 Initial Diagnosis   Endometrial cancer ([place])   10/18/2021 Surgery   Trh/BSO, bilateral SLN biopsy  Findings: On EUA, small mobile uterus. Normal upper abdominal survey on intra-abdominal entry. Normal omentum, small and large bowel. Significant diverticular disease. Normal adnexa. Uterus 10cm with some small subserosal fibroids. Atrophic adnexa. Mapping successful to right obturator and left external iliac SLN. Significant bladder prolapse. No intra-abdominal or pelvic evidence of disease. Cystoscopy performed given some difficulty developing bladder flap; dome intact, bilateral ureteral orifices.    10/18/2021 Pathologic Stage   Stage IB mixed clear cell (15%) and grade 1 endometrioid (85%). MI 60% No LVI Negative Slns      Interval History: Doing well, denies any pain.  Recovery has been much easier than she was expecting.  Continues to have some constipation, is using Senokot and MiraLAX.  Has constipation at baseline.  Has noted  some dysuria, thinks she may have a urinary tract infection or yeast infection.  Denies any vaginal pain or pruritus.  Denies any bleeding or discharge.  Past Medical/Surgical History: Past Medical History:  Diagnosis Date   Anemia    Anxiety    Arthritis    CKD (chronic kidney disease) stage 3, GFR 30-59 ml/min (HCC)    Colon polyp    Diverticulosis    Endometrial cancer ([place])    Hypertension    Hypothyroidism    Iron deficiency    Peripheral neuropathy    in bilateral legs   Plantar fasciitis    Pre-diabetes    Prediabetes    Pt denies   Small bowel obstruction ([place])     Past Surgical History:  Procedure Laterality Date   CATARACT EXTRACTION     CYSTOSCOPY  10/18/2021   Procedure: CYSTOSCOPY;  Surgeon: Lafonda Mosses, MD;  Location: [facility];  Service: Gynecology;;   EYE SURGERY     MACULAR TEAR     ROBOTIC ASSISTED TOTAL HYSTERECTOMY WITH BILATERAL SALPINGO OOPHERECTOMY N/A 10/18/2021   Procedure: XI ROBOTIC ASSISTED TOTAL HYSTERECTOMY WITH BILATERAL SALPINGO OOPHORECTOMY;  Surgeon: Lafonda Mosses, MD;  Location: [facility];  Service: Gynecology;  Laterality: N/A;   SENTINEL NODE BIOPSY N/A 10/18/2021   Procedure: SENTINEL NODE BIOPSY;  Surgeon: Lafonda Mosses, MD;  Location: [facility];  Service: Gynecology;  Laterality: N/A;   Vericose vein  1965    Family History  Problem Relation Age of Onset   Heart disease Mother    Stroke Mother    Heart disease Father    Colon polyps Sister    Diabetes Sister    Stroke Sister    Heart disease Brother    Stroke Brother  Hypertension Son    Kidney cancer Nephew     Social History   Socioeconomic History   Marital status: Widowed    Spouse name: Not on file   Number of children: Not on file   Years of education: Not on file   Highest education level: Not on file  Occupational History   Not on file  Tobacco Use   Smoking status: Former    Packs/day: 1.00    Years: 15.00    Total pack years: 15.00    Types:  Cigarettes    Quit date: 06/11/1965    Years since quitting: 56.4   Smokeless tobacco: Never  Vaping Use   Vaping Use: Never used  Substance and Sexual Activity   Alcohol use: No   Drug use: No   Sexual activity: Not Currently  Other Topics Concern   Not on file  Social History Narrative   Not on file   Social Determinants of Health   Financial Resource Strain: Not on file  Food Insecurity: Not on file  Transportation Needs: Not on file  Physical Activity: Not on file  Stress: Not on file  Social Connections: Not on file    Current Medications:  Current Outpatient Medications:    acetaminophen (TYLENOL) 325 MG tablet, Take 325 mg by mouth every 6 (six) hours as needed., Disp: , Rfl:    amLODipine (NORVASC) 10 MG tablet, TAKE 1/2 TO 1 (ONE-HALF TO ONE) TABLET BY MOUTH ONCE DAILY FOR BLOOD PRESSURE (Patient taking differently: Take 2.5 mg by mouth daily as needed (elevated blood pressure.). Pt checks b/p in am and pm if blood pressure is elevated at nite she will take), Disp: 90 tablet, Rfl: 1   Ascorbic Acid (VITAMIN C PO), Take 1,000 mg by mouth daily., Disp: , Rfl:    atenolol (TENORMIN) 50 MG tablet, Take  1 tablet  Daily  for BP                         /       Take 1 tablet by mouth once daily for blood pressure (Patient taking differently: Take 12.5 mg by mouth daily as needed (elevated blood pressure.). Pt checks blood pressure am and pm if blood pressure Is elevated in the pm pt will take), Disp: 90 tablet, Rfl: 3   benazepril (LOTENSIN) 40 MG tablet, Take 1 tablet by mouth once daily for blood pressure, Disp: 90 tablet, Rfl: 0   Cholecalciferol (VITAMIN D3) 125 MCG (5000 UT) TABS, Take 5,000 Units by mouth daily., Disp: , Rfl:    clorazepate (TRANXENE) 7.5 MG tablet, Take 1/2 - 1 tablet 2 - 3 x /day ONLY if needed for Anxiety Attack &  limit to 5 days /week to avoid Addiction & Dementia (Patient taking differently: Take 1.875 mg by mouth daily as needed for anxiety. Take 1/2 -  1 tablet 2 - 3 x /day ONLY if needed for Anxiety Attack &  limit to 5 days /week to avoid Addiction & Dementia), Disp: 30 tablet, Rfl: 0   clotrimazole (LOTRIMIN) 1 % cream, Apply 1 application topically 2 (two) times daily., Disp: 30 g, Rfl: 0   Cyanocobalamin (VITAMIN B-12) 2500 MCG SUBL, Take 2,500 mcg by mouth daily., Disp: , Rfl:    Magnesium 250 MG TABS, Take 1 tablet by mouth daily., Disp: , Rfl:    meclizine (ANTIVERT) 25 MG tablet, TAKE 1/2 TO 1 (ONE-HALF TO ONE) TABLET BY MOUTH THREE  TIMES DAILY FOR MOTION SICKNESS AND DIZINESS, Disp: 30 tablet, Rfl: 0   senna-docusate (SENOKOT-S) 8.6-50 MG tablet, Take 2 tablets by mouth at bedtime. For AFTER surgery, do not take if having diarrhea, Disp: 30 tablet, Rfl: 0   SYNTHROID 150 MCG tablet, Take  1 tablet  Daily  on an empty stomach with only water for 30 minutes & no Antacid meds, Calcium or Magnesium for 4 hours & avoid Biotin, Disp: 90 tablet, Rfl: 3   zinc gluconate 50 MG tablet, Take 50 mg by mouth daily., Disp: , Rfl:   Review of Systems: + frequency, constipation, dysuria Denies appetite changes, fevers, chills, fatigue, unexplained weight changes. Denies hearing loss, neck lumps or masses, mouth sores, ringing in ears or voice changes. Denies cough or wheezing.  Denies shortness of breath. Denies chest pain or palpitations. Denies leg swelling. Denies abdominal distention, pain, blood in stools, diarrhea, nausea, vomiting, or early satiety. Denies pain with intercourse, hematuria or incontinence. Denies hot flashes, pelvic pain, vaginal bleeding or vaginal discharge.   Denies joint pain, back pain or muscle pain/cramps. Denies itching, rash, or wounds. Denies dizziness, headaches, numbness or seizures. Denies swollen lymph nodes or glands, denies easy bruising or bleeding. Denies anxiety, depression, confusion, or decreased concentration.  Physical Exam: BP (!) 149/72 (BP Location: Left Arm, Patient Position: Sitting)   Pulse 73    Temp 97.8 F (36.6 C) (Tympanic)   Resp 16   Ht 5' 2"  (1.575 m)   Wt 150 lb 1.6 oz (68.1 kg)   SpO2 100%   BMI 27.45 kg/m  General: Alert, oriented, no acute distress. HEENT: Normocephalic, atraumatic, sclera anicteric. Chest: Unlabored breathing on room air. Abdomen: soft, nontender.  Normoactive bowel sounds.  No masses or hepatosplenomegaly appreciated.  Well-healed incisions, remaining suture excised.  Some laxity of anterior abdominal wall. Extremities: Grossly normal range of motion.  Warm, well perfused.  1+ pitting edema of the left lower leg more than the right, no pain with palpation of either calf, slight bulge noted along the external aspect of the left lower leg. Skin: No rashes or lesions noted. Lymphatics: No cervical, supraclavicular, or inguinal adenopathy. GU: Normal appearing external genitalia without erythema, excoriation, or lesions.  Speculum exam reveals moderately atrophic vaginal mucosa, no discharge or bleeding.  Cuff intact with suture visible.  Bimanual exam reveals cuff intact, no fluctuance or tenderness with palpation.    Laboratory & Radiologic Studies: None new  Assessment & Plan: Mikayla Harrison is a 88 y.o. woman with Stage IB (IIC by 2023 staging) mixed low-grade endometrioid and clear cell who presents for follow-up after surgery. MSI-H, MMR abn, MLH1 promoter hypermethylation absent.    Patient is overall doing very well and meeting postoperative milestones.  Discussed continued expectations and restrictions.  We reviewed her pathology in detail again.  The majority of her tumor is a low-grade endometrioid type, but there is a small component that is high grade (clear cell).  We discussed her case at tumor board.  We discussed various options for treatment ranging from vaginal brachytherapy only, to pelvic radiation, to chemotherapy with vaginal brachytherapy.  Given her abnormal MMR (I suspect she will be MSI-H), by analysis using molecular  classification of the PORTEC 3 data, there could be an argument made for treating the patient with RT alone.  Patients with clear cell cancer made up a small subset of this group, but those with MMR deficient tumors and a 5-year recurrence free survival that was not statistically  different between chemotherapy with radiation versus radiation alone.  The patient had wanted to speak with me before being scheduled to see either medical or radiation oncology.  After our discussion today, she is amenable to meeting with Drs. Kinard and Cove.  She and I will have a phone visit in a couple of weeks to finalize her treatment plan.  I have encouraged her to at minimum consider vaginal brachytherapy.  With regard to her left lower leg edema, she has had this since at least March.  There is not been any significant change.  She has no calf tenderness to palpation and we obtained a lower extremity Doppler at the end of March which was negative for any evidence of DVT.  I encouraged her to reach out to her primary care provider.  The patient has knee issues on that leg as well as [place] fasciitis.  She may benefit from additional assessment of her musculoskeletal issues.  Plan to send urinalysis and culture today given her symptoms.   28 minutes of total time was spent for this patient encounter, including preparation, face-to-face counseling with the patient and coordination of care, and documentation of the encounter.  Jeral Pinch, MD  Division of Gynecologic Oncology  Department of Obstetrics and Gynecology  [facility]

## 2021-11-18 ENCOUNTER — Other Ambulatory Visit: Payer: Self-pay | Admitting: Gynecologic Oncology

## 2021-11-18 DIAGNOSIS — N390 Urinary tract infection, site not specified: Secondary | ICD-10-CM

## 2021-11-18 MED ORDER — AMOXICILLIN 500 MG PO TABS: 500.0000 mg | ORAL_TABLET | Freq: Two times a day (BID) | ORAL | 0 refills | Status: AC

## 2021-11-18 NOTE — Progress Notes (Signed)
Called patient. Left her a message regarding antibiotic sent to pharmacy for UTI.

## 2021-11-19 LAB — URINE CULTURE: Culture: >=100000 — AB

## 2021-11-20 ENCOUNTER — Telehealth: Payer: Self-pay | Admitting: *Deleted

## 2021-11-20 ENCOUNTER — Other Ambulatory Visit: Payer: Self-pay | Admitting: Adult Health

## 2021-11-20 NOTE — Telephone Encounter (Signed)
Spoke with pt this morning who stated that the pain and burning with urination has decreased significantly. She denies pain, fever, chills, discharge, or odor. Informed pt to let us know if any new symptoms develop or worsens. She verbalized understanding.

## 2021-11-28 ENCOUNTER — Ambulatory Visit
Admission: RE | Admit: 2021-11-28 | Discharge: 2021-11-28 | Disposition: A | Discharge status: Home / Self Care | Payer: Medicare HMO | Source: Ambulatory Visit | Attending: Radiation Oncology | Admitting: Radiation Oncology

## 2021-11-28 ENCOUNTER — Encounter: Payer: Self-pay | Admitting: Radiation Oncology

## 2021-11-28 ENCOUNTER — Encounter: Payer: Self-pay | Admitting: Hematology and Oncology

## 2021-11-28 ENCOUNTER — Inpatient Hospital Stay (HOSPITAL_BASED_OUTPATIENT_CLINIC_OR_DEPARTMENT_OTHER): Payer: Medicare HMO | Admitting: Hematology and Oncology

## 2021-11-28 ENCOUNTER — Other Ambulatory Visit: Payer: Self-pay

## 2021-11-28 VITALS — BP 121/70 | HR 76 | Temp 97.2°F | Resp 18 | Ht 62.0 in | Wt 150.1 lb

## 2021-11-28 DIAGNOSIS — C55 Malignant neoplasm of uterus, part unspecified: Secondary | ICD-10-CM

## 2021-11-28 DIAGNOSIS — C541 Malignant neoplasm of endometrium: Secondary | ICD-10-CM

## 2021-11-28 NOTE — Progress Notes (Signed)
Radiation Oncology         [phone] ________________________________  Initial Outpatient Consultation  Name: Mikayla Harrison MRN: [id number]  Date: 11/28/2021  DOB: Sep 05, 1931  CC:[name], MD  Lafonda Mosses, MD   REFERRING PHYSICIAN: Lafonda Mosses, MD  DIAGNOSIS: The primary encounter diagnosis was Clear cell adenocarcinoma of uterus [facility]). A diagnosis of Endometrial cancer [facility]) was also pertinent to this visit.  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 female who is seen as a courtesy of Dr. Berline Lopes for an opinion concerning radiation therapy as part of management for her recently diagnosed endometrial cancer.   The patient presented to her Gynecologist, Dr. Judeth Horn, on 06/26/21 with the cc's of the intermittent postmenopausal bleeding and vaginal discharge since June of 2022. She also endorsed constipation, diarrhea, and feeling sick to her stomach for a couple of days. Pap smear performed during this visit revealed HPV negative ASC-US. Vaginitis testing also performed was negative for infection.   Pelvic/transvaginal US performed on 07/20/21 revealed a uterus measuring 7.3 x 4.9 x 3.6 cm; endometrial lining of 29.1 mm; and a heterogeneous endometrium with solid and cystic components, slightly vascular, measuring 3.2 x 3.9 cm. [place] also showed an atrophic appearing right ovary. Left ovary not visualized. No masses were appreciated.   Endometrial biopsy collected on 08/21/21 by Dr. Judeth Horn revealed adenocarcinoma with mixed clear cell carcinoma and endometrioid carcinoma (low-grade).  IHC staining showed clear cell carcinoma pattern, high-grade foci, with the remainder of the carcinoma within the endometrioid being a low-grade pattern. 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    CT of the chest, abdomen, and pelvis performed on 09/04/21 demonstrated a low-attenuation heterogenously enhancing masslike area in the left side of the uterine body and fundus measuring 1.7 x 1.9 x 3.5 cm.  No definitive findings to suggest metastatic disease were appreciated.  Other findings of potential clinical significance noted on CT included a small likely benign left adrenal nodule, stable compared to prior study from 2015.   Accordingly, the patient was referred to Dr. Berline Lopes on 09/07/21 for further management. Physical exam performed during this visit was normal other than the 3-4 cm cervical cyst along the anterior cervix which was appreciated by Dr. 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 The patient was also found to have left greater than right LE edema, for which Dr. Berline Lopes ordered an LE doppler which revealed no evidence of DVT in either LE . In terms of treatment options, the patient agreed to proceed with hysterectomy, BSO, and nodal biopsies.   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 Cervix, bilateral fallopian tubes and ovaries were benign. Nodal status of 2/2 sentinel lymph node biopsies negative for carcinoma (1 right obturator SLN and 1 left external SLN). Molecular pathology showed MSI-high and abnormal MMR.   Dr. Berline Lopes discussed the patients case at the tumor board held on 10/30/21.  Various options for treatment ranging from vaginal brachytherapy only, to pelvic radiation, to chemotherapy with vaginal brachytherapy were discussed at that time. Given her abnormal MMR, Dr. Berline Lopes noted that there may be  an indication for treating the patient with RT alone. Following thorough discussion with  Dr. Berline Lopes, the patient agreed to meet with myself and Dr. Alvy Bimler for further review of her treatment options.   During her most recent follow-up visit with Dr. Berline Lopes on 11/17/21, the patient was noted to be recovering well from her procedure. She however reported some dysuria, which she suspects may be due to a UTI or yeast infection. She otherwise denied any vaginal pain, pruritus, bleeding or discharge. For her urinary symptoms, Dr. Berline Lopes sent in for urinalysis and culture which revealed findings consistent with UTI. She has was prescribed abx with improvement of symptoms.    PREVIOUS RADIATION THERAPY: No  PAST MEDICAL HISTORY:  Past Medical History:  Diagnosis Date   Anemia    Anxiety    Arthritis    CKD (chronic kidney disease) stage 3, GFR 30-59 ml/min (HCC)    Colon polyp    Diverticulosis    Endometrial cancer ([place])    Hypertension    Hypothyroidism    Iron deficiency    Peripheral neuropathy    in bilateral legs   Plantar fasciitis    Pre-diabetes    Prediabetes    Pt denies   Small bowel obstruction ([place])     PAST SURGICAL HISTORY: Past Surgical History:  Procedure Laterality Date   CATARACT EXTRACTION     CYSTOSCOPY  10/18/2021   Procedure: CYSTOSCOPY;  Surgeon: Lafonda Mosses, MD;  Location: [facility];  Service: Gynecology;;   EYE SURGERY     MACULAR TEAR     ROBOTIC ASSISTED TOTAL HYSTERECTOMY WITH BILATERAL SALPINGO OOPHERECTOMY N/A 10/18/2021   Procedure: XI ROBOTIC ASSISTED TOTAL HYSTERECTOMY WITH BILATERAL SALPINGO OOPHORECTOMY;  Surgeon: Lafonda Mosses, MD;  Location: [facility];  Service: Gynecology;  Laterality: N/A;   SENTINEL NODE BIOPSY N/A 10/18/2021   Procedure: SENTINEL NODE BIOPSY;  Surgeon: Lafonda Mosses, MD;  Location: [facility];  Service: Gynecology;  Laterality: N/A;   Vericose vein  1965    FAMILY HISTORY:  Family History  Problem Relation Age of Onset   Heart disease Mother    Stroke Mother    Heart disease Father    Colon  polyps Sister    Diabetes Sister    Stroke Sister    Heart disease Brother    Stroke Brother    Cancer Maternal Aunt    Hypertension Son    Cancer Nephew    Kidney cancer Nephew     SOCIAL HISTORY:  Social History   Tobacco Use   Smoking status: Former    Packs/day: 1.00    Years: 15.00    Total pack years: 15.00    Types: Cigarettes    Quit date: 06/11/1965    Years since quitting: 56.5   Smokeless tobacco: Never  Vaping Use   Vaping Use: Never used  Substance Use Topics   Alcohol use: No   Drug use: No    ALLERGIES:  Allergies  Allergen Reactions   Sulfa Antibiotics Other (See Comments)   Codeine Nausea Only   Prednisone Nausea And Vomiting, Nausea Only and Other (See Comments)   Asa [Aspirin] Anxiety and Other (See Comments)    Take $Rem'81mg'kbVt$  cannot take $RemoveBef'325mg'DrwnqTPrur$  gets nervous    MEDICATIONS:  Current Outpatient Medications  Medication Sig Dispense Refill   acetaminophen (TYLENOL) 325 MG tablet Take 325 mg by mouth every 6 (six) hours as needed.     amLODipine (NORVASC) 10 MG tablet TAKE 1/2 TO  1 (ONE-HALF TO ONE) TABLET BY MOUTH ONCE DAILY FOR BLOOD PRESSURE 90 tablet 0   Ascorbic Acid (VITAMIN C PO) Take 1,000 mg by mouth daily.     atenolol (TENORMIN) 50 MG tablet Take  1 tablet  Daily  for BP                         /       Take 1 tablet by mouth once daily for blood pressure (Patient taking differently: Take 12.5 mg by mouth daily as needed (elevated blood pressure.). Pt checks blood pressure am and pm if blood pressure Is elevated in the pm pt will take) 90 tablet 3   benazepril (LOTENSIN) 40 MG tablet Take 1 tablet by mouth once daily for blood pressure 90 tablet 0   Cholecalciferol (VITAMIN D3) 125 MCG (5000 UT) TABS Take 5,000 Units by mouth daily.     clorazepate (TRANXENE) 7.5 MG tablet Take 1/2 - 1 tablet 2 - 3 x /day ONLY if needed for Anxiety Attack &  limit to 5 days /week to avoid Addiction & Dementia (Patient taking differently: Take 1.875 mg by mouth daily  as needed for anxiety. Take 1/2 - 1 tablet 2 - 3 x /day ONLY if needed for Anxiety Attack &  limit to 5 days /week to avoid Addiction & Dementia) 30 tablet 0   clotrimazole (LOTRIMIN) 1 % cream Apply 1 application topically 2 (two) times daily. 30 g 0   Cyanocobalamin (VITAMIN B-12) 2500 MCG SUBL Take 2,500 mcg by mouth daily.     Magnesium 250 MG TABS Take 1 tablet by mouth daily.     meclizine (ANTIVERT) 25 MG tablet TAKE 1/2 TO 1 (ONE-HALF TO ONE) TABLET BY MOUTH THREE TIMES DAILY FOR MOTION SICKNESS AND DIZINESS 30 tablet 0   senna-docusate (SENOKOT-S) 8.6-50 MG tablet Take 2 tablets by mouth at bedtime. For AFTER surgery, do not take if having diarrhea 30 tablet 0   SYNTHROID 150 MCG tablet Take  1 tablet  Daily  on an empty stomach with only water for 30 minutes & no Antacid meds, Calcium or Magnesium for 4 hours & avoid Biotin 90 tablet 3   zinc gluconate 50 MG tablet Take 50 mg by mouth daily.     No current facility-administered medications for this encounter.    REVIEW OF SYSTEMS:  A 10+ POINT REVIEW OF SYSTEMS WAS OBTAINED including neurology, dermatology, psychiatry, cardiac, respiratory, lymph, extremities, GI, GU, musculoskeletal, constitutional, reproductive, HEENT.  She denies any pelvic pain vaginal bleeding or discharge since her surgery.   PHYSICAL EXAM:  height is _0  (1.575 m) and weight is 150 lb 2 oz (68.1 kg). Her temporal temperature is 97.2 F (36.2 C) (abnormal). Her blood pressure is 121/70 and her pulse is 76. Her respiration is 18 and oxygen saturation is 100%.   General: Alert and oriented, in no acute distress HEENT: Head is normocephalic. Extraocular movements are intact.  Neck: Neck is supple, no palpable cervical or supraclavicular lymphadenopathy. Heart: Regular in rate and rhythm with no murmurs, rubs, or gallops. Chest: Clear to auscultation bilaterally, with no rhonchi, wheezes, or rales. Abdomen: Soft, nontender, nondistended, with no rigidity or  guarding. Extremities: No cyanosis or edema. Lymphatics: see Neck Exam Skin: No concerning lesions. Musculoskeletal: symmetric strength and muscle tone throughout. Neurologic: Cranial nerves II through XII are grossly intact. No obvious focalities. Speech is fluent. Coordination is intact. Psychiatric: Judgment and insight are  intact. Affect is appropriate. Pelvic exam not performed today in light of Dr. Charisse March recent exam.  ECOG = 1  0 - Asymptomatic (Fully active, able to carry on all predisease activities without restriction)  1 - Symptomatic but completely ambulatory (Restricted in physically strenuous activity but ambulatory and able to carry out work of a light or sedentary nature. For example, light housework, office work)  2 - Symptomatic, <50% in bed during the day (Ambulatory and capable of all self care but unable to carry out any work activities. Up and about more than 50% of waking hours)  3 - Symptomatic, >50% in bed, but not bedbound (Capable of only limited self-care, confined to bed or chair 50% or more of waking hours)  4 - Bedbound (Completely disabled. Cannot carry on any self-care. Totally confined to bed or chair)  5 - Death   Eustace Pen MM, Creech RH, Tormey DC, et al. [phone]). "Toxicity and response criteria of the [facility]". [place] Oncol. 5 (6): 649-55  LABORATORY DATA:  Lab Results  Component Value Date   WBC 7.8 11/01/2021   HGB 10.0 (L) 11/01/2021   HCT 31.0 (L) 11/01/2021   MCV 85.2 11/01/2021   PLT 287 11/01/2021   NEUTROABS 5,015 11/01/2021   Lab Results  Component Value Date   NA 137 11/01/2021   K 4.6 11/01/2021   CL 103 11/01/2021   CO2 25 11/01/2021   GLUCOSE 99 11/01/2021   BUN 18 11/01/2021   CREATININE 1.02 (H) 11/01/2021   CALCIUM 9.2 11/01/2021      RADIOGRAPHY: No results found.    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  We discussed that she should consider adjuvant treatment to reduce chances for recurrence.  She did meet with Dr. Alvy Bimler who will address systemic options.  We discussed her radiation options including pelvic radiation therapy plus limited vaginal brachytherapy and vaginal brachytherapy alone.  I discussed the anticipated side effects of both approaches and potential long-term toxicities.  Patient was shown equipment that may be used for vaginal brachytherapy treatments.  We also discussed the potential recurrence rates in the vaginal cuff region with and without brachytherapy treatments.  Questions were answered.  At this time the patient seems to be leaning towards close follow-up and not pursuing any potential adjuvant courses of treatment.  She however will meet with Dr. Berline Lopes later this week and let her know the final decision concerning adjuvant treatment.    PLAN: Treatment plan pending at this time but likely the patient will pursue surveillance and not pursue any adjuvant treatment.   60 minutes of total time was spent for this patient encounter, including preparation, face-to-face counseling with the patient and coordination of care, physical exam, and documentation of the encounter.   ------------------------------------------------  Blair Promise, PhD, MD  This document serves as a record of services personally performed by Gery Pray, MD. It was created on his behalf by Roney Mans, a trained medical scribe. The creation of this record is based on the scribe's personal observations and the provider's statements to them. This document has been checked and approved by the attending provider.

## 2021-11-28 NOTE — Progress Notes (Addendum)
GYN Location of Tumor / Histology: Endometrial  Mikayla Harrison presented with symptoms of: post menopausal bleeding  Biopsies revealed: Mixed clear cell and endometrioid carcinoma  Past/Anticipated interventions by Gyn/Onc surgery, if any: Robotic-assisted laparoscopic total hysterectomy with bilateral salpingoophorectomy, SLN biopsy 10/18/2021 by Dr. Berline Lopes    Past/Anticipated interventions by medical oncology, if any: Has appointment scheduled with Dr. Alvy Bimler today.  Weight changes, if any: no  Bowel/Bladder complaints, if any: has occasional constipation. She recently has a UTI which was treated with amoxicillin.  Nausea/Vomiting, if any: no  Pain issues, if any:  no  SAFETY ISSUES: Prior radiation? no Pacemaker/ICD? no Possible current pregnancy? no Is the patient on methotrexate? no  Current Complaints / other details:    BP 121/70 (BP Location: Left Arm, Patient Position: Sitting)   Pulse 76   Temp (!) 97.2 F (36.2 C) (Temporal)   Resp 18   Ht '5\' 2"'$  (1.575 m)   Wt 150 lb 2 oz (68.1 kg)   SpO2 100%   BMI 27.46 kg/m

## 2021-11-28 NOTE — Assessment & Plan Note (Signed)
I reviewed plan of care with the patient We discussed the role of adjuvant treatment We discussed the role of use of immunotherapy in her situation We discussed the risk, benefits, side effects of treatment Ultimately, the patient declined adjuvant treatment and would like to be observed only I encouraged her to continue follow-up with her GYN surgeon We discussed signs and symptoms to watch out for cancer recurrence I have not made a return appointment for the patient to come

## 2021-11-28 NOTE — Progress Notes (Signed)
[facility] NOTE  Patient Care Team: [name], MD as PCP - General (Internal Medicine)  ASSESSMENT & PLAN:  Endometrial cancer [facility]) I reviewed plan of care with the patient We discussed the role of adjuvant treatment We discussed the role of use of immunotherapy in her situation We discussed the risk, benefits, side effects of treatment Ultimately, the patient declined adjuvant treatment and would like to be observed only I encouraged her to continue follow-up with her GYN surgeon We discussed signs and symptoms to watch out for cancer recurrence I have not made a return appointment for the patient to come  No orders of the defined types were placed in this encounter.   The total time spent in the appointment was 55 minutes encounter with patients including review of chart and various tests results, discussions about plan of care and coordination of care plan   All questions were answered. The patient knows to call the clinic with any problems, questions or concerns. No barriers to learning was detected.  Heath Lark, MD 6/20/20233:07 PM  CHIEF COMPLAINTS/PURPOSE OF CONSULTATION:  Uterine cancer, for further evaluation  HISTORY OF PRESENTING ILLNESS:  Mikayla Harrison 88 y.o. female is here because of recent diagnosis of uterine cancer Her symptoms started last year when she presented with postmenopausal bleeding that led to subsequent investigation and evaluation I have reviewed her chart and materials related to her cancer extensively and collaborated history with the patient. 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   09/04/2021 Imaging   CT A/P: IMPRESSION: 1. 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 No definitive findings to suggest metastatic disease to the chest, abdomen or pelvis. 2. Small left adrenal nodule, stable compared to prior study from 2015, considered benign (likely an adenoma). 3. Aortic atherosclerosis, in addition to 2 vessel coronary artery disease. 4. Additional incidental findings, as above.   09/08/2021 Initial Diagnosis   Endometrial cancer ([place])   10/18/2021 Surgery   Trh/BSO, bilateral SLN biopsy  Findings: On EUA, small mobile uterus. Normal upper abdominal survey on intra-abdominal entry. Normal omentum, small and large bowel. Significant diverticular disease. Normal adnexa. Uterus 10cm with some small subserosal fibroids. Atrophic adnexa. Mapping successful to right obturator and left external iliac SLN. Significant bladder prolapse. No intra-abdominal or pelvic evidence of disease. Cystoscopy performed given some difficulty developing bladder flap; dome intact, bilateral ureteral orifices.    10/18/2021 Pathologic Stage   Stage IB mixed clear cell (15%) and grade 1 endometrioid (85%). MI 60% No LVI Negative Slns    11/28/2021 Cancer Staging   Staging form: Corpus Uteri - Carcinoma and Carcinosarcoma, AJCC 8th Edition - Pathologic stage from 11/28/2021: Stage IB (pT1b, pN0, cM0) - Signed by Heath Lark, MD on 11/28/2021 Stage prefix: Initial diagnosis     MEDICAL HISTORY:  Past Medical History:  Diagnosis Date   Anemia    Anxiety    Arthritis    CKD (chronic kidney disease) stage 3, GFR 30-59 ml/min (HCC)    Colon polyp    Diverticulosis    Endometrial cancer ([place])    Hypertension    Hypothyroidism  Iron deficiency    Peripheral neuropathy    in bilateral legs   Plantar fasciitis    Pre-diabetes    Prediabetes    Pt denies   Small bowel obstruction ([place])     SURGICAL  HISTORY: Past Surgical History:  Procedure Laterality Date   CATARACT EXTRACTION     CYSTOSCOPY  10/18/2021   Procedure: CYSTOSCOPY;  Surgeon: Lafonda Mosses, MD;  Location: [facility];  Service: Gynecology;;   EYE SURGERY     MACULAR TEAR     ROBOTIC ASSISTED TOTAL HYSTERECTOMY WITH BILATERAL SALPINGO OOPHERECTOMY N/A 10/18/2021   Procedure: XI ROBOTIC ASSISTED TOTAL HYSTERECTOMY WITH BILATERAL SALPINGO OOPHORECTOMY;  Surgeon: Lafonda Mosses, MD;  Location: [facility];  Service: Gynecology;  Laterality: N/A;   SENTINEL NODE BIOPSY N/A 10/18/2021   Procedure: SENTINEL NODE BIOPSY;  Surgeon: Lafonda Mosses, MD;  Location: [facility];  Service: Gynecology;  Laterality: N/A;   Vericose vein  1965    SOCIAL HISTORY: Social History   Socioeconomic History   Marital status: Widowed    Spouse name: Not on file   Number of children: Not on file   Years of education: Not on file   Highest education level: Not on file  Occupational History   Not on file  Tobacco Use   Smoking status: Former    Packs/day: 1.00    Years: 15.00    Total pack years: 15.00    Types: Cigarettes    Quit date: 06/11/1965    Years since quitting: 56.5   Smokeless tobacco: Never  Vaping Use   Vaping Use: Never used  Substance and Sexual Activity   Alcohol use: No   Drug use: No   Sexual activity: Not Currently  Other Topics Concern   Not on file  Social History Narrative   Not on file   Social Determinants of Health   Financial Resource Strain: Not on file  Food Insecurity: Not on file  Transportation Needs: Not on file  Physical Activity: Not on file  Stress: Not on file  Social Connections: Not on file  Intimate Partner Violence: Not on file    FAMILY HISTORY: Family History  Problem Relation Age of Onset   Heart disease Mother    Stroke Mother    Heart disease Father    Colon polyps Sister    Diabetes Sister    Stroke Sister    Heart disease Brother    Stroke Brother    Cancer  Maternal Aunt    Hypertension Son    Cancer Nephew    Kidney cancer Nephew     ALLERGIES:  is allergic to sulfa antibiotics, codeine, prednisone, and asa [aspirin].  MEDICATIONS:  Current Outpatient Medications  Medication Sig Dispense Refill   acetaminophen (TYLENOL) 325 MG tablet Take 325 mg by mouth every 6 (six) hours as needed.     amLODipine (NORVASC) 10 MG tablet TAKE 1/2 TO 1 (ONE-HALF TO ONE) TABLET BY MOUTH ONCE DAILY FOR BLOOD PRESSURE 90 tablet 0   Ascorbic Acid (VITAMIN C PO) Take 1,000 mg by mouth daily.     atenolol (TENORMIN) 50 MG tablet Take  1 tablet  Daily  for BP                         /       Take 1 tablet by mouth once daily for blood pressure (Patient taking differently: Take 12.5 mg by mouth daily as  needed (elevated blood pressure.). Pt checks blood pressure am and pm if blood pressure Is elevated in the pm pt will take) 90 tablet 3   benazepril (LOTENSIN) 40 MG tablet Take 1 tablet by mouth once daily for blood pressure 90 tablet 0   Cholecalciferol (VITAMIN D3) 125 MCG (5000 UT) TABS Take 5,000 Units by mouth daily.     clorazepate (TRANXENE) 7.5 MG tablet Take 1/2 - 1 tablet 2 - 3 x /day ONLY if needed for Anxiety Attack &  limit to 5 days /week to avoid Addiction & Dementia (Patient taking differently: Take 1.875 mg by mouth daily as needed for anxiety. Take 1/2 - 1 tablet 2 - 3 x /day ONLY if needed for Anxiety Attack &  limit to 5 days /week to avoid Addiction & Dementia) 30 tablet 0   clotrimazole (LOTRIMIN) 1 % cream Apply 1 application topically 2 (two) times daily. 30 g 0   Cyanocobalamin (VITAMIN B-12) 2500 MCG SUBL Take 2,500 mcg by mouth daily.     Magnesium 250 MG TABS Take 1 tablet by mouth daily.     meclizine (ANTIVERT) 25 MG tablet TAKE 1/2 TO 1 (ONE-HALF TO ONE) TABLET BY MOUTH THREE TIMES DAILY FOR MOTION SICKNESS AND DIZINESS 30 tablet 0   senna-docusate (SENOKOT-S) 8.6-50 MG tablet Take 2 tablets by mouth at bedtime. For AFTER surgery, do not  take if having diarrhea 30 tablet 0   SYNTHROID 150 MCG tablet Take  1 tablet  Daily  on an empty stomach with only water for 30 minutes & no Antacid meds, Calcium or Magnesium for 4 hours & avoid Biotin 90 tablet 3   zinc gluconate 50 MG tablet Take 50 mg by mouth daily.     No current facility-administered medications for this visit.    REVIEW OF SYSTEMS: She is doing well after surgery.  Denies pain.  Her appetite is stable Constitutional: Denies fevers, chills or abnormal night sweats Eyes: Denies blurriness of vision, double vision or watery eyes Ears, nose, mouth, throat, and face: Denies mucositis or sore throat Respiratory: Denies cough, dyspnea or wheezes Cardiovascular: Denies palpitation, chest discomfort or lower extremity swelling Gastrointestinal:  Denies nausea, heartburn or change in bowel habits Skin: Denies abnormal skin rashes Lymphatics: Denies new lymphadenopathy or easy bruising Neurological:Denies numbness, tingling or new weaknesses Behavioral/Psych: Mood is stable, no new changes  All other systems were reviewed with the patient and are negative.  PHYSICAL EXAMINATION: ECOG PERFORMANCE STATUS: 0 - Asymptomatic  Vitals:   11/28/21 1353  BP: (!) 145/71  Pulse: 68  Resp: 16  Temp: (!) 97.5 F (36.4 C)  SpO2: 100%   Filed Weights   11/28/21 1353  Weight: 150 lb 12.8 oz (68.4 kg)    GENERAL:alert, no distress and comfortable PSYCH: alert & oriented x 3 with fluent speech NEURO: no focal motor/sensory deficits  LABORATORY DATA:  I have reviewed the data as listed Lab Results  Component Value Date   WBC 7.8 11/01/2021   HGB 10.0 (L) 11/01/2021   HCT 31.0 (L) 11/01/2021   MCV 85.2 11/01/2021   PLT 287 11/01/2021   Recent Labs    06/19/21 1523 08/30/21 1418 09/12/21 1326 10/10/21 1430 10/19/21 0803 11/01/21 1502  NA 135   < > 136 138 134* 137  K 4.4   < > 4.3 4.0 4.6 4.6  CL 99   < > 104 105 101 103  CO2 24   < > _0 25  GLUCOSE 97    < > 105* 98 122* 99  BUN 17   < > _0 CREATININE 0.99*   < > 1.00 1.00 0.89 1.02*  CALCIUM 9.2   < > 8.9 9.2 8.2* 9.2  GFRNONAA  --    < > 55* 55* >60  --   PROT 6.7  --  7.3  --   --  6.7  ALBUMIN  --   --  3.8  --   --   --   AST 18  --  22  --   --  19  ALT 13  --  14  --   --  9  ALKPHOS  --   --  71  --   --   --   BILITOT 0.4  --  0.3  --   --  0.4   < > = values in this interval not displayed.    RADIOGRAPHIC STUDIES: I have reviewed her imaging study I have personally reviewed the radiological images as listed and agreed with the findings in the report.

## 2021-11-30 ENCOUNTER — Encounter: Payer: Self-pay | Admitting: Gynecologic Oncology

## 2021-11-30 ENCOUNTER — Inpatient Hospital Stay (HOSPITAL_BASED_OUTPATIENT_CLINIC_OR_DEPARTMENT_OTHER): Payer: Medicare HMO | Admitting: Gynecologic Oncology

## 2021-11-30 DIAGNOSIS — Z7189 Other specified counseling: Secondary | ICD-10-CM

## 2021-11-30 DIAGNOSIS — C541 Malignant neoplasm of endometrium: Secondary | ICD-10-CM

## 2021-11-30 DIAGNOSIS — Z90710 Acquired absence of both cervix and uterus: Secondary | ICD-10-CM

## 2021-11-30 DIAGNOSIS — Z90722 Acquired absence of ovaries, bilateral: Secondary | ICD-10-CM

## 2021-11-30 NOTE — Progress Notes (Signed)
Gynecologic Oncology Telehealth Consult Note: Gyn-Onc  I connected with Mikayla Harrison on 11/30/21 at  4:30 PM EDT by telephone and verified that I am speaking with the correct person using two identifiers.  I discussed the limitations, risks, security and privacy concerns of performing an evaluation and management service by telemedicine and the availability of in-person appointments. I also discussed with the patient that there may be a patient responsible charge related to this service. The patient expressed understanding and agreed to proceed.  Other persons participating in the visit and their role in the encounter: none.  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   09/04/2021 Imaging   CT A/P: IMPRESSION: 1. 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 No definitive findings to suggest metastatic disease to the chest, abdomen or pelvis. 2. Small left adrenal nodule, stable compared to prior study from 2015, considered benign (likely an adenoma). 3. Aortic atherosclerosis, in addition to 2 vessel coronary artery disease. 4. Additional incidental findings, as above.   09/08/2021 Initial Diagnosis   Endometrial cancer ([place])   10/18/2021 Surgery   Trh/BSO, bilateral SLN biopsy  Findings: On EUA, small mobile uterus. Normal upper abdominal survey on intra-abdominal entry. Normal omentum, small and large bowel. Significant diverticular disease. Normal adnexa. Uterus 10cm with some small subserosal fibroids. Atrophic adnexa.  Mapping successful to right obturator and left external iliac SLN. Significant bladder prolapse. No intra-abdominal or pelvic evidence of disease. Cystoscopy performed given some difficulty developing bladder flap; dome intact, bilateral ureteral orifices.    10/18/2021 Pathologic Stage   Stage IB mixed clear cell (15%) and grade 1 endometrioid (85%). MI 60% No LVI Negative Slns    11/28/2021 Cancer Staging   Staging form: Corpus Uteri - Carcinoma and Carcinosarcoma, AJCC 8th Edition - Pathologic stage from 11/28/2021: Stage IB (pT1b, pN0, cM0) - Signed by Heath Lark, MD on 11/28/2021 Stage prefix: Initial diagnosis     Interval History: Patient reports doing well.  Had a good visit with both Dr. Alvy Bimler and Dr. Sondra Come.  Continues to do well since surgery. Finished antibiotics for her urinary tract infection.  Reports almost complete resolution of her symptoms.  Past Medical/Surgical History: Past Medical History:  Diagnosis Date   Anemia    Anxiety    Arthritis    CKD (chronic kidney disease) stage 3, GFR 30-59 ml/min (HCC)    Colon polyp    Diverticulosis    Endometrial cancer ([place])    Hypertension    Hypothyroidism    Iron deficiency    Peripheral neuropathy    in bilateral legs   Plantar fasciitis    Pre-diabetes    Prediabetes    Pt denies   Small bowel obstruction [facility])     Past Surgical History:  Procedure Laterality Date   CATARACT EXTRACTION     CYSTOSCOPY  10/18/2021   Procedure: CYSTOSCOPY;  Surgeon: Lafonda Mosses, MD;  Location: [facility];  Service: Gynecology;;   EYE SURGERY     MACULAR TEAR     ROBOTIC ASSISTED TOTAL HYSTERECTOMY WITH BILATERAL SALPINGO OOPHERECTOMY N/A 10/18/2021   Procedure:  XI ROBOTIC ASSISTED TOTAL HYSTERECTOMY WITH BILATERAL SALPINGO OOPHORECTOMY;  Surgeon: Lafonda Mosses, MD;  Location: [facility];  Service: Gynecology;  Laterality: N/A;   SENTINEL NODE BIOPSY N/A 10/18/2021   Procedure: SENTINEL NODE BIOPSY;  Surgeon: Lafonda Mosses, MD;  Location: [facility];  Service: Gynecology;  Laterality: N/A;   Vericose vein  1965    Family History  Problem Relation Age of Onset   Heart disease Mother    Stroke Mother    Heart disease Father    Colon polyps Sister    Diabetes Sister    Stroke Sister    Heart disease Brother    Stroke Brother    Cancer Maternal Aunt    Hypertension Son    Cancer Nephew    Kidney cancer Nephew     Social History   Socioeconomic History   Marital status: Widowed    Spouse name: Not on file   Number of children: Not on file   Years of education: Not on file   Highest education level: Not on file  Occupational History   Not on file  Tobacco Use   Smoking status: Former    Packs/day: 1.00    Years: 15.00    Total pack years: 15.00    Types: Cigarettes    Quit date: 06/11/1965    Years since quitting: 56.5   Smokeless tobacco: Never  Vaping Use   Vaping Use: Never used  Substance and Sexual Activity   Alcohol use: No   Drug use: No   Sexual activity: Not Currently  Other Topics Concern   Not on file  Social History Narrative   Not on file   Social Determinants of Health   Financial Resource Strain: Not on file  Food Insecurity: Not on file  Transportation Needs: Not on file  Physical Activity: Not on file  Stress: Not on file  Social Connections: Not on file    Current Medications:  Current Outpatient Medications:    acetaminophen (TYLENOL) 325 MG tablet, Take 325 mg by mouth every 6 (six) hours as needed., Disp: , Rfl:    amLODipine (NORVASC) 10 MG tablet, TAKE 1/2 TO 1 (ONE-HALF TO ONE) TABLET BY MOUTH ONCE DAILY FOR BLOOD PRESSURE, Disp: 90 tablet, Rfl: 0   Ascorbic Acid (VITAMIN C PO), Take 1,000 mg by mouth daily., Disp: , Rfl:    atenolol (TENORMIN) 50 MG tablet, Take  1 tablet  Daily  for BP                         /       Take 1 tablet by mouth once daily for blood pressure (Patient taking differently: Take 12.5 mg by mouth daily as needed (elevated  blood pressure.). Pt checks blood pressure am and pm if blood pressure Is elevated in the pm pt will take), Disp: 90 tablet, Rfl: 3   benazepril (LOTENSIN) 40 MG tablet, Take 1 tablet by mouth once daily for blood pressure, Disp: 90 tablet, Rfl: 0   Cholecalciferol (VITAMIN D3) 125 MCG (5000 UT) TABS, Take 5,000 Units by mouth daily., Disp: , Rfl:    clorazepate (TRANXENE) 7.5 MG tablet, Take 1/2 - 1 tablet 2 - 3 x /day ONLY if needed for Anxiety Attack &  limit to 5 days /week to avoid Addiction & Dementia (Patient taking differently: Take 1.875 mg by mouth daily as needed for anxiety. Take 1/2 - 1 tablet 2 - 3  x /day ONLY if needed for Anxiety Attack &  limit to 5 days /week to avoid Addiction & Dementia), Disp: 30 tablet, Rfl: 0   clotrimazole (LOTRIMIN) 1 % cream, Apply 1 application topically 2 (two) times daily., Disp: 30 g, Rfl: 0   Cyanocobalamin (VITAMIN B-12) 2500 MCG SUBL, Take 2,500 mcg by mouth daily., Disp: , Rfl:    Magnesium 250 MG TABS, Take 1 tablet by mouth daily., Disp: , Rfl:    meclizine (ANTIVERT) 25 MG tablet, TAKE 1/2 TO 1 (ONE-HALF TO ONE) TABLET BY MOUTH THREE TIMES DAILY FOR MOTION SICKNESS AND DIZINESS, Disp: 30 tablet, Rfl: 0   senna-docusate (SENOKOT-S) 8.6-50 MG tablet, Take 2 tablets by mouth at bedtime. For AFTER surgery, do not take if having diarrhea, Disp: 30 tablet, Rfl: 0   SYNTHROID 150 MCG tablet, Take  1 tablet  Daily  on an empty stomach with only water for 30 minutes & no Antacid meds, Calcium or Magnesium for 4 hours & avoid Biotin, Disp: 90 tablet, Rfl: 3   zinc gluconate 50 MG tablet, Take 50 mg by mouth daily., Disp: , Rfl:   Review of Symptoms: Pertinent positives as per HPI.  Physical Exam: There were no vitals taken for this visit. Deferred given limitations of phone visit.  Laboratory & Radiologic Studies: None new  Assessment & Plan: Mikayla Harrison is a 88 y.o. woman with Stage IB (IIC by 2023 staging) mixed low-grade endometrioid and clear  cell who presents for follow-up after surgery. MSI-H, MMR abn, MLH1 promoter hypermethylation absent.   Patient has thought about adjuvant treatment options and is wanting to move forward with vaginal brachytherapy.  We have spent time discussing previously benefits of additional treatment either with chemotherapy or pelvic radiation.  Given her age, she is wanting to avoid either chemotherapy or pelvic radiation which I think is reasonable.  I have asked her to reach out to Dr. Sondra Come to let him know that she'd like to proceed with radiation.  Asked the patient to call or stop by to drop off a urine specimen if her urinary symptoms do not completely resolve.  I discussed the assessment and treatment plan with the patient. The patient was provided with an opportunity to ask questions and all were answered. The patient agreed with the plan and demonstrated an understanding of the instructions.   The patient was advised to call back or see an in-person evaluation if the symptoms worsen or if the condition fails to improve as anticipated.   8 minutes of total time was spent for this patient encounter, including preparation, face-to-face counseling with the patient and coordination of care, and documentation of the encounter.   Jeral Pinch, MD  Division of Gynecologic Oncology  Department of Obstetrics and Gynecology  [facility]

## 2021-12-05 DIAGNOSIS — C55 Malignant neoplasm of uterus, part unspecified: Secondary | ICD-10-CM

## 2021-12-06 ENCOUNTER — Telehealth: Payer: Self-pay

## 2021-12-06 NOTE — Telephone Encounter (Signed)
Pt aware of Dr.Tuckers message. She was thankful of the call.

## 2021-12-06 NOTE — Telephone Encounter (Signed)
Pt called office with 2 questions.  1) Can she take a tub bath? She is 7 weeks out, surgery was 5/11 total hyst with BSO.  2) What was the actual size of the tumor?   Pt aware Dr.Tucker is in the OR today. I will send a message and will call pt back with answers. Pt ok with plan.

## 2021-12-07 ENCOUNTER — Inpatient Hospital Stay: Payer: Medicare HMO | Admitting: Genetic Counselor

## 2021-12-18 ENCOUNTER — Telehealth: Payer: Self-pay

## 2021-12-18 ENCOUNTER — Other Ambulatory Visit: Payer: Self-pay

## 2021-12-18 ENCOUNTER — Inpatient Hospital Stay: Payer: Medicare HMO | Attending: Gynecologic Oncology

## 2021-12-18 DIAGNOSIS — R300 Dysuria: Secondary | ICD-10-CM | POA: Insufficient documentation

## 2021-12-18 DIAGNOSIS — C541 Malignant neoplasm of endometrium: Secondary | ICD-10-CM | POA: Diagnosis not present

## 2021-12-18 LAB — URINALYSIS, COMPLETE (UACMP) WITH MICROSCOPIC
Bacteria, UA: NONE SEEN
Bilirubin Urine: NEGATIVE
Glucose, UA: NEGATIVE mg/dL
Hgb urine dipstick: NEGATIVE
Ketones, ur: NEGATIVE mg/dL
Nitrite: NEGATIVE
Protein, ur: NEGATIVE mg/dL
Specific Gravity, Urine: 1.01 (ref 1.005–1.030)
pH: 5 (ref 5.0–8.0)

## 2021-12-18 NOTE — Telephone Encounter (Signed)
Attempted to reach pt regarding her urine results. Per Joylene John NP, the sample does not suggest a UTI, however, we will wait to see what the culture results and will call her at that time.

## 2021-12-19 ENCOUNTER — Telehealth: Payer: Self-pay

## 2021-12-19 LAB — URINE CULTURE: Culture: NO GROWTH

## 2021-12-19 NOTE — Telephone Encounter (Signed)
Unable to reach pt at number in chart, phone kept ringing, no voicemail pick up.

## 2021-12-19 NOTE — Telephone Encounter (Signed)
Attempted to contact patient with urine culture results. Per Joylene John, NP no sign of infection on urine culture. Unable to contact patient and unable to leave message.

## 2021-12-19 NOTE — Telephone Encounter (Signed)
Spoke with Ms. Mikayla Harrison and let her know that per Joylene John, NP patients urine sample does not suggest a UTI, however, we will wait to see what the urine culture results are and will call her with results. Patient verbalized understanding.

## 2021-12-20 NOTE — Progress Notes (Unsigned)
Assessment and Plan:  There are no diagnoses linked to this encounter.    Further disposition pending results of labs. Discussed med's effects and SE's.   Over 30 minutes of exam, counseling, chart review, and critical decision making was performed.   Future Appointments  Date Time Provider [place]  12/21/2021 10:30 AM Alycia Rossetti, NP [facility] None  01/11/2022 11:00 AM Flippin, Mammie Lorenzo, Counselor [facility] None  02/13/2022  3:00 PM [name], MD [facility] None  06/19/2022 11:00 AM Alycia Rossetti, NP [facility] None    ------------------------------------------------------------------------------------------------------------------   HPI There were no vitals taken for this visit. 88 y.o.female presents for  Past Medical History:  Diagnosis Date   Anemia    Anxiety    Arthritis    CKD (chronic kidney disease) stage 3, GFR 30-59 ml/min (HCC)    Colon polyp    Diverticulosis    Endometrial cancer (HCC)    Hypertension    Hypothyroidism    Iron deficiency    Peripheral neuropathy    in bilateral legs   Plantar fasciitis    Pre-diabetes    Prediabetes    Pt denies   Small bowel obstruction (HCC)      Allergies  Allergen Reactions   Sulfa Antibiotics Other (See Comments)   Codeine Nausea Only   Prednisone Nausea And Vomiting, Nausea Only and Other (See Comments)   Asa [Aspirin] Anxiety and Other (See Comments)    Take '81mg'$  cannot take '325mg'$  gets nervous    Current Outpatient Medications on File Prior to Visit  Medication Sig   acetaminophen (TYLENOL) 325 MG tablet Take 325 mg by mouth every 6 (six) hours as needed.   amLODipine (NORVASC) 10 MG tablet TAKE 1/2 TO 1 (ONE-HALF TO ONE) TABLET BY MOUTH ONCE DAILY FOR BLOOD PRESSURE   Ascorbic Acid (VITAMIN C PO) Take 1,000 mg by mouth daily.   atenolol (TENORMIN) 50 MG tablet Take  1 tablet  Daily  for BP                         /       Take 1 tablet by mouth once daily for blood pressure  (Patient taking differently: Take 12.5 mg by mouth daily as needed (elevated blood pressure.). Pt checks blood pressure am and pm if blood pressure Is elevated in the pm pt will take)   benazepril (LOTENSIN) 40 MG tablet Take 1 tablet by mouth once daily for blood pressure   Cholecalciferol (VITAMIN D3) 125 MCG (5000 UT) TABS Take 5,000 Units by mouth daily.   clorazepate (TRANXENE) 7.5 MG tablet Take 1/2 - 1 tablet 2 - 3 x /day ONLY if needed for Anxiety Attack &  limit to 5 days /week to avoid Addiction & Dementia (Patient taking differently: Take 1.875 mg by mouth daily as needed for anxiety. Take 1/2 - 1 tablet 2 - 3 x /day ONLY if needed for Anxiety Attack &  limit to 5 days /week to avoid Addiction & Dementia)   clotrimazole (LOTRIMIN) 1 % cream Apply 1 application topically 2 (two) times daily.   Cyanocobalamin (VITAMIN B-12) 2500 MCG SUBL Take 2,500 mcg by mouth daily.   Magnesium 250 MG TABS Take 1 tablet by mouth daily.   meclizine (ANTIVERT) 25 MG tablet TAKE 1/2 TO 1 (ONE-HALF TO ONE) TABLET BY MOUTH THREE TIMES DAILY FOR MOTION SICKNESS AND DIZINESS   senna-docusate (SENOKOT-S) 8.6-50 MG tablet Take 2 tablets by mouth  at bedtime. For AFTER surgery, do not take if having diarrhea   SYNTHROID 150 MCG tablet Take  1 tablet  Daily  on an empty stomach with only water for 30 minutes & no Antacid meds, Calcium or Magnesium for 4 hours & avoid Biotin   zinc gluconate 50 MG tablet Take 50 mg by mouth daily.   No current facility-administered medications on file prior to visit.    ROS: all negative except above.   Physical Exam:  There were no vitals taken for this visit.  General Appearance: Well nourished, in no apparent distress. Eyes: PERRLA, EOMs, conjunctiva no swelling or erythema Sinuses: No Frontal/maxillary tenderness ENT/Mouth: Ext aud canals clear, TMs without erythema, bulging. No erythema, swelling, or exudate on post pharynx.  Tonsils not swollen or erythematous. Hearing  normal.  Neck: Supple, thyroid normal.  Respiratory: Respiratory effort normal, BS equal bilaterally without rales, rhonchi, wheezing or stridor.  Cardio: RRR with no MRGs. Brisk peripheral pulses without edema.  Abdomen: Soft, + BS.  Non tender, no guarding, rebound, hernias, masses. Lymphatics: Non tender without lymphadenopathy.  Musculoskeletal: Full ROM, 5/5 strength, normal gait.  Skin: Warm, dry without rashes, lesions, ecchymosis.  Neuro: Cranial nerves intact. Normal muscle tone, no cerebellar symptoms. Sensation intact.  Psych: Awake and oriented X 3, normal affect, Insight and Judgment appropriate.     Alycia Rossetti, NP 8:57 AM [facility]

## 2021-12-20 NOTE — Telephone Encounter (Signed)
Pt aware of negative urine culture result. She was very thankful for the call.

## 2021-12-21 ENCOUNTER — Ambulatory Visit (INDEPENDENT_AMBULATORY_CARE_PROVIDER_SITE_OTHER): Payer: Medicare HMO | Admitting: Nurse Practitioner

## 2021-12-21 ENCOUNTER — Encounter: Payer: Self-pay | Admitting: Nurse Practitioner

## 2021-12-21 VITALS — BP 102/62 | HR 81 | Temp 97.7°F | Wt 149.2 lb

## 2021-12-21 DIAGNOSIS — I10 Essential (primary) hypertension: Secondary | ICD-10-CM

## 2021-12-21 DIAGNOSIS — J301 Allergic rhinitis due to pollen: Secondary | ICD-10-CM

## 2021-12-21 MED ORDER — FLUTICASONE PROPIONATE 50 MCG/ACT NA SUSP: 2.0000 | Freq: Every day | NASAL | 1 refills | Status: DC

## 2021-12-21 MED ORDER — MONTELUKAST SODIUM 10 MG PO TABS: 10.0000 mg | ORAL_TABLET | Freq: Every day | ORAL | 2 refills | Status: DC

## 2021-12-21 NOTE — Patient Instructions (Signed)
Start Montelukast 1 pill at bedtime daily Flonase 2 sprays each nostril every morning Begin Mucinex daily for 7 days If no improvement notify the office and will place a ENT referral   Allergic Rhinitis, Adult Allergic rhinitis is a reaction to allergens. Allergens are things that can cause an allergic reaction. This condition affects the lining inside the nose (mucous membrane). There are two types of allergic rhinitis: Seasonal. This type is also called hay fever. It happens only during some times of the year. Perennial. This type can happen at any time of the year. This condition cannot be spread from person to person (is not contagious). It can be mild, worse, or very bad. It can develop at any age and may be outgrown. What are the causes? This condition may be caused by: Pollen from grasses, trees, and weeds. Dust mites. Smoke. Mold. Car fumes. The pee (urine), spit, or dander of pets. Dander is dead skin cells from a pet. What increases the risk? You are more likely to develop this condition if: You have allergies in your family. You have problems like allergies in your family. You may have: Swelling of parts of your eyes and eyelids. Asthma. This affects how you breathe. Long-term redness and swelling on your skin. Food allergies. What are the signs or symptoms? The main symptom of this condition is a runny or stuffy nose (nasal congestion). Other symptoms may include: Sneezing or coughing. Itching and tearing of your eyes. Mucus that drips down the back of your throat (postnasal drip). Trouble sleeping. Feeling tired. Headache. Sore throat. How is this treated? There is no cure for this condition. You should avoid things that you are allergic to. Treatment can help to relieve symptoms. This may include: Medicines that block allergy symptoms, such as corticosteroids or antihistamines. These may be given as a shot, nasal spray, or pill. Avoiding things you are allergic  to. Medicines that give you bits of what you are allergic to over time. This is called immunotherapy. It is done if other treatments do not help. You may get: Shots. Medicine under your tongue. Stronger medicines, if other treatments do not help. Follow these instructions at home: Avoiding allergens Find out what things you are allergic to and avoid them. To do this, try these things: If you get allergies any time of year: Replace carpet with wood, tile, or vinyl flooring. Carpet can trap pet dander and dust. Do not smoke. Do not allow smoking in your home. Change your heating and air conditioning filters at least once a month. If you get allergies only some times of the year: Keep windows closed when you can. Plan things to do outside when pollen counts are lowest. Check pollen counts before you plan things to do outside. When you come indoors, change your clothes and shower before you sit on furniture or bedding. If you are allergic to a pet: Keep the pet out of your bedroom. Vacuum, sweep, and dust often.  General instructions Take over-the-counter and prescription medicines only as told by your doctor. Drink enough fluid to keep your pee (urine) pale yellow. Keep all follow-up visits as told by your doctor. This is important. Where to find more information American Academy of Allergy, Asthma & Immunology: www.aaaai.org Contact a doctor if: You have a fever. You get a cough that does not go away. You make whistling sounds when you breathe (wheeze). Your symptoms slow you down. Your symptoms stop you from doing your normal things each day. Get  help right away if: You are short of breath. This symptom may be an emergency. Do not wait to see if the symptom will go away. Get medical help right away. Call your local emergency services (911 in the [place] Do not drive yourself to the hospital. Summary Allergic rhinitis may be treated by taking medicines and avoiding things you are  allergic to. If you have allergies only some of the year, keep windows closed when you can at those times. Contact your doctor if you get a fever or a cough that does not go away. This information is not intended to replace advice given to you by your health care provider. Make sure you discuss any questions you have with your health care provider. Document Revised: 07/20/2019 Document Reviewed: 05/26/2019 Elsevier Patient Education  [place].

## 2021-12-26 DIAGNOSIS — Z1231 Encounter for screening mammogram for malignant neoplasm of breast: Secondary | ICD-10-CM | POA: Diagnosis not present

## 2021-12-27 ENCOUNTER — Telehealth: Payer: Self-pay | Admitting: Oncology

## 2021-12-27 NOTE — Telephone Encounter (Signed)
Called Mikayla Harrison to discuss scheduling radiation treatment.  She is still thinking about it and is concerned about the side effects.  She wants to talk to Dr. Quincy Simmonds first.  Gave her my phone number to call when she makes her final decision.

## 2022-01-08 ENCOUNTER — Telehealth: Payer: Self-pay

## 2022-01-08 DIAGNOSIS — M25572 Pain in left ankle and joints of left foot: Secondary | ICD-10-CM | POA: Diagnosis not present

## 2022-01-08 DIAGNOSIS — M170 Bilateral primary osteoarthritis of knee: Secondary | ICD-10-CM | POA: Diagnosis not present

## 2022-01-08 NOTE — Telephone Encounter (Signed)
Patient called in voice mail stating she would like to schedule an appointment to speak by phone with Dr. Quincy Simmonds.  I called the patient back and let her know Dr. Quincy Simmonds out until [facility] and that "phone visit" is not something I an schedule for her but I am happy to see how I can help her.  Patient had surgery with Dr. Valarie Cones on 10/18/21 Robotic Assisted TLH, BSO with nodes.  Patient said Dr. Berline Lopes told her the lymph nodes were negative and the cancer has not spread. She said they are recommending Vaginal Cuff Radiation Therapy.  Patient said she has researched this and has decided this is not something she wants to proceed with.  She mentioned her age as a consideration in her decision.    She said she wants to see if she could just schedule regular follow ups with Dr. Quincy Simmonds "since she is the one who found this in the first place".  I asked her what Dr. Berline Lopes recommended but she has not had the conversation with Dr. Berline Lopes that she does not want radiation. Patient said "they are really pushing that radiation".   I told patient I will forward to Dr. Quincy Simmonds for her return mid week and we will see how she recommends patient proceed.

## 2022-01-11 ENCOUNTER — Inpatient Hospital Stay: Payer: Medicare HMO | Attending: Gynecologic Oncology | Admitting: Genetic Counselor

## 2022-01-12 NOTE — Telephone Encounter (Signed)
I do recommend the patient follow up with Dr. Berline Lopes for her post hysterectomy care for endometrial cancer.   Dr. Berline Lopes will advise the patient when it is ok for her to be seen back at this office for more routine care.   I recommend she discuss with Dr. Berline Lopes the risks of doing the radiation therapy as well as the risks of not doing the radiation therapy.  Dr. Berline Lopes can speak to risks of recurrence of the cancer with and without the radiation.   I hope this helps.

## 2022-01-12 NOTE — Telephone Encounter (Signed)
Left message for patient to call.

## 2022-01-17 ENCOUNTER — Other Ambulatory Visit: Payer: Self-pay | Admitting: Nurse Practitioner

## 2022-01-17 ENCOUNTER — Telehealth: Payer: Self-pay | Admitting: Nurse Practitioner

## 2022-01-17 DIAGNOSIS — H9193 Unspecified hearing loss, bilateral: Secondary | ICD-10-CM

## 2022-01-17 DIAGNOSIS — G8929 Other chronic pain: Secondary | ICD-10-CM

## 2022-01-17 NOTE — Telephone Encounter (Signed)
I have placed a referral .  They will contact her to schedule an appointment

## 2022-01-17 NOTE — Telephone Encounter (Signed)
Pt is wanting a referral to ENT due to ongoing problems with hearing

## 2022-01-19 ENCOUNTER — Telehealth: Payer: Self-pay | Admitting: *Deleted

## 2022-01-19 NOTE — Telephone Encounter (Signed)
Attempted to reach the patient to reschedule her missed genetics appts; LMOM to call the office.

## 2022-01-22 NOTE — Telephone Encounter (Signed)
Patient informed. 

## 2022-02-07 NOTE — Telephone Encounter (Signed)
Spoke with the patient and rescheduled the genetics appt to 9/14

## 2022-02-13 ENCOUNTER — Encounter: Payer: Medicare PPO | Admitting: Internal Medicine

## 2022-02-14 ENCOUNTER — Other Ambulatory Visit: Payer: Self-pay | Admitting: Nurse Practitioner

## 2022-02-22 ENCOUNTER — Inpatient Hospital Stay: Payer: Medicare HMO

## 2022-02-22 ENCOUNTER — Inpatient Hospital Stay: Payer: Medicare HMO | Admitting: Genetic Counselor

## 2022-02-28 NOTE — Progress Notes (Signed)
Mikayla Harrison  [id number]   Annual Screening/Preventative Visit & Comprehensive Evaluation &  Examination   Future Appointments  Date Time Provider Department  03/01/2022  3:00 PM [name], MD [facility]  03/13/2022  3:45 PM Lafonda Mosses, MD [facility]  06/19/2022                   wellness 11:00 AM Alycia Rossetti, NP [facility]  03/06/2023  3:00 PM [name], MD [facility]         This very nice 88 y.o.  WWF presents for a Screening /Preventative Visit & comprehensive evaluation and management of multiple medical co-morbidities.  Patient has been followed for HTN, HLD, Prediabetes  and Vitamin D Deficiency.        HTN predates circa 1986. Patient's BP has been controlled at home.  Patient has CKD3b (GFR 41) consequent of her AS & HTCVD.  Patient denies any cardiac symptoms as chest pain, palpitations, shortness of breath, dizziness or ankle swelling. Today's BP is  at goal  - 122/50 .       Patient's hyperlipidemia is controlled with diet and medications. Patient denies myalgias or other medication SE's. Last lipids were at goal;  Lab Results  Component Value Date   CHOL 152 11/01/2021   HDL 43 (L) 11/01/2021   LDLCALC 89 11/01/2021   TRIG 103 11/01/2021   CHOLHDL 3.5 11/01/2021         Patient has hx/o prediabetes  (A1c 5.7% /2014 and 5.8% /2018) and patient denies reactive hypoglycemic symptoms, visual blurring, diabetic polys or paresthesias. Last A1c was still not at goal :  Lab Results  Component Value Date   HGBA1C 5.8 (H) 11/01/2021         Finally, patient has history of Vitamin D Deficiency ("36" /2009 & "29" /2016)  and last Vitamin D was borderline elevated and dose was tapered :  Lab Results  Component Value Date   VD25OH 113 (H) 06/19/2021        Current Outpatient Medications on File Prior to Visit  Medication Sig   acetaminophen 325 MG tablet Take  every 6 hours as needed.   amLODipine  10 MG tablet TAKE 1/2 TO 1 DAILY    VITAMIN C  Take 1  tablet  daily.   aspirin 81 MG tablet Take  daily.   atenolol  50 MG tablet Take 1 tablet once daily for blood pressure   benazepril 40 MG tablet Take  1 tablet  Daily  for BP   VITAMIN D 1000 units  Take 1,000 Units  2 times daily.   clorazepate 7.5 MG tablet Take 1/2 TO 1  tab 2 -3 x /day as needed    gabapentin 100 MG capsule Take 1 capsule 3 times daily.   Magnesium 500 MG TABS Take 2 tablets by   daily.   meclizine 25 MG tablet TAKE 1/2 TO 1 TAB  3 x /da as needed   SYNTHROID 150 MCG tablet Take  1 tablet  Daily     valACYclovir 1000 MG tablet Take 1 tablet 3 times daily. Take for 7 days   vitamin B-12  500 MCG tablet Take  daily.   zinc 50 MG tablet  Takes 1/2 tablet daily.    Allergies  Allergen Reactions   Sulfa Antibiotics Other (See Comments)   Asa [Aspirin] Other (See Comments)    cannot take '325mg'$  gets nervous   Codeine Nausea Only   Prednisone Nausea And Vomiting  Past Medical History:  Diagnosis Date   CKD stage 3, GFR 30-59 ml/min (HCC)    Colon polyp    Diverticulosis    Hypertension    Iron deficiency    Prediabetes    Prediabetes    Small bowel obstruction [facility])     Health Maintenance  Topic Date Due   COVID-19 Vaccine (1) Never done   Zoster Vaccines- Shingrix (1 of 2) Never done   INFLUENZA VACCINE  01/09/2021   TETANUS/TDAP  10/11/2027   DEXA SCAN  Completed   PNA vac Low Risk Adult  Completed   HPV VACCINES  Aged Out    Immunization History  Administered Date(s) Administered   DT  03/22/2015   Influenza Split 03/04/2013   Influenza, High Dose  04/23/2017, 04/30/2018, 04/08/2019   Pneumococcal - 23 03/16/2014   Td 10/10/2017    Last Colon - 01/09/2011 - Dr Henrene Pastor - Tubovillous adenoma polyp & was recommend 3-6 mo f/u repeat Colonoscopy which patient  has repeatedly refused .   Last MGM - 01/25/2010- patient refuses to schedule f/u MGM   Past Surgical History:  Procedure Laterality Date   CATARACT EXTRACTION     EYE SURGERY      MACULAR TEAR     Vericose vein  1960     Family History  Problem Relation Age of Onset   Heart disease Mother    Stroke Mother    Heart disease Father    Colon polyps Sister    Diabetes Sister    Stroke Sister    Heart disease Brother    Stroke Brother    Hypertension Son      Social History   Tobacco Use   Smoking status: Former    Packs/day: 1.00    Years: 15.00    Pack years: 15.00    Types: Cigarettes    Quit date: 06/11/1965    Years since quitting: 55.7   Smokeless tobacco: Never  Vaping Use   Vaping Use: Never used  Substance Use Topics   Alcohol use: No   Drug use: No      ROS Constitutional: Denies fever, chills, weight loss/gain, headaches, insomnia,  night sweats, and change in appetite. Does c/o fatigue. Eyes: Denies redness, blurred vision, diplopia, discharge, itchy, watery eyes.  ENT: Denies discharge, congestion, post nasal drip, epistaxis, sore throat, earache, hearing loss, dental pain, Tinnitus, Vertigo, Sinus pain, snoring.  Cardio: Denies chest pain, palpitations, irregular heartbeat, syncope, dyspnea, diaphoresis, orthopnea, PND, claudication, edema Respiratory: denies cough, dyspnea, DOE, pleurisy, hoarseness, laryngitis, wheezing.  Gastrointestinal: Denies dysphagia, heartburn, reflux, water brash, pain, cramps, nausea, vomiting, bloating, diarrhea, constipation, hematemesis, melena, hematochezia, jaundice, hemorrhoids Genitourinary: Denies dysuria, frequency, urgency, nocturia, hesitancy, discharge, hematuria, flank pain Breast: Breast lumps, nipple discharge, bleeding.  Musculoskeletal: Denies arthralgia, myalgia, stiffness, Jt. Swelling, pain, limp, and strain/sprain. Denies falls. Skin: Denies puritis, rash, hives, warts, acne, eczema, changing in skin lesion Neuro: No weakness, tremor, incoordination, spasms, paresthesia, pain Psychiatric: Denies confusion, memory loss, sensory loss. Denies Depression. Endocrine: Denies change in weight,  skin, hair change, nocturia, and paresthesia, diabetic polys, visual blurring, hyper / hypo glycemic episodes.  Heme/Lymph: No excessive bleeding, bruising, enlarged lymph nodes.  Physical Exam  BP (!) 122/50   Pulse 67   Temp 98.1 F (36.7 C)   Resp 16   Ht '5\' 2"'$  (1.575 m)   Wt 145 lb 3.2 oz (65.9 kg)   SpO2 99%   BMI 26.56 kg/m   General Appearance: Well  nourished  and in no apparent distress.  Eyes: PERRLA, EOMs, conjunctiva no swelling or erythema, normal fundi and vessels. Sinuses: No frontal/maxillary tenderness ENT/Mouth: EACs patent / TMs  nl. Nares clear without erythema, swelling, mucoid exudates. Oral hygiene is good. No erythema, swelling, or exudate. Tongue normal, non-obstructing. Tonsils not swollen or erythematous. Hearing normal.  Neck: Supple, thyroid not palpable. No bruits, nodes or JVD. Respiratory: Respiratory effort normal.  BS equal and clear bilateral without rales, rhonci, wheezing or stridor. Cardio: Heart sounds are normal with regular rate and rhythm and no murmurs, rubs or gallops. Peripheral pulses are normal and equal bilaterally without edema. No aortic or femoral bruits. Chest: symmetric with normal excursions and percussion. Breasts: Declined exam. Abdomen: Flat, soft with bowel sounds active. Nontender, no guarding, rebound, hernias, masses, or organomegaly.  Lymphatics: Non tender without lymphadenopathy.   Musculoskeletal: Full ROM all peripheral extremities, joint stability, 5/5 strength, and normal gait. Skin: Warm and dry without rashes, lesions, cyanosis, clubbing or  ecchymosis.  Neuro: Cranial nerves intact, reflexes equal bilaterally. Normal muscle tone, no cerebellar symptoms. Sensation intact.  Pysch: Alert and oriented X 3, normal affect, Insight and Judgment appropriate.    Assessment and Plan  1. Annual Preventative Screening Examination   2. Essential hypertension  - EKG 12-Lead - Urinalysis, Routine w reflex microscopic -  Microalbumin / creatinine urine ratio - CBC with Differential/Platelet - COMPLETE METABOLIC PANEL WITH GFR - Magnesium - TSH  3. Hyperlipidemia, mixed  - EKG 12-Lead - Lipid panel - TSH  4. Abnormal glucose  - EKG 12-Lead - Hemoglobin A1c - Insulin, random  5. Vitamin D deficiency  - VITAMIN D 25 Hydroxy   6. Stage 3b chronic kidney disease ([place])   7. Hypothyroidism  - Urinalysis, Routine w reflex microscopic - Microalbumin / creatinine urine ratio - COMPLETE METABOLIC PANEL WITH GFR  8. Screening for colorectal cancer  - POC Hemoccult Bld/Stl   9. Screening for heart disease  - EKG 12-Lead  10. FHx: heart disease  - EKG 12-Lead  11. Former smoker  - EKG 12-Lead  12. Medication management  - EKG 12-Lead - Urinalysis, Routine w reflex microscopic - CBC with Differential/Platelet - COMPLETE METABOLIC PANEL WITH GFR - Magnesium - Lipid panel - TSH - Hemoglobin A1c - Insulin, random - VITAMIN D 25 Hydroxy          Patient was counseled in prudent diet to achieve/maintain BMI less than 25 for weight control, BP monitoring, regular exercise and medications. Discussed med's effects and SE's. Screening labs and tests as requested with regular follow-up as recommended. Over 40 minutes of exam, counseling, chart review and high complex critical decision making was performed.   Kirtland Bouchard, MD

## 2022-02-28 NOTE — Patient Instructions (Signed)

## 2022-03-01 ENCOUNTER — Ambulatory Visit (INDEPENDENT_AMBULATORY_CARE_PROVIDER_SITE_OTHER): Payer: Medicare HMO | Admitting: Internal Medicine

## 2022-03-01 ENCOUNTER — Encounter: Payer: Self-pay | Admitting: Internal Medicine

## 2022-03-01 VITALS — BP 122/50 | HR 67 | Temp 98.1°F | Resp 16 | Ht 62.0 in | Wt 145.2 lb

## 2022-03-01 DIAGNOSIS — E039 Hypothyroidism, unspecified: Secondary | ICD-10-CM | POA: Diagnosis not present

## 2022-03-01 DIAGNOSIS — Z1211 Encounter for screening for malignant neoplasm of colon: Secondary | ICD-10-CM

## 2022-03-01 DIAGNOSIS — I10 Essential (primary) hypertension: Secondary | ICD-10-CM | POA: Diagnosis not present

## 2022-03-01 DIAGNOSIS — Z0000 Encounter for general adult medical examination without abnormal findings: Secondary | ICD-10-CM

## 2022-03-01 DIAGNOSIS — E782 Mixed hyperlipidemia: Secondary | ICD-10-CM | POA: Diagnosis not present

## 2022-03-01 DIAGNOSIS — Z87891 Personal history of nicotine dependence: Secondary | ICD-10-CM

## 2022-03-01 DIAGNOSIS — R7309 Other abnormal glucose: Secondary | ICD-10-CM

## 2022-03-01 DIAGNOSIS — N1832 Chronic kidney disease, stage 3b: Secondary | ICD-10-CM

## 2022-03-01 DIAGNOSIS — E559 Vitamin D deficiency, unspecified: Secondary | ICD-10-CM | POA: Diagnosis not present

## 2022-03-01 DIAGNOSIS — Z79899 Other long term (current) drug therapy: Secondary | ICD-10-CM | POA: Diagnosis not present

## 2022-03-01 DIAGNOSIS — Z8249 Family history of ischemic heart disease and other diseases of the circulatory system: Secondary | ICD-10-CM | POA: Diagnosis not present

## 2022-03-01 DIAGNOSIS — Z0001 Encounter for general adult medical examination with abnormal findings: Secondary | ICD-10-CM

## 2022-03-01 DIAGNOSIS — Z136 Encounter for screening for cardiovascular disorders: Secondary | ICD-10-CM

## 2022-03-01 DIAGNOSIS — R7303 Prediabetes: Secondary | ICD-10-CM

## 2022-03-02 ENCOUNTER — Other Ambulatory Visit: Payer: Self-pay | Admitting: Internal Medicine

## 2022-03-02 DIAGNOSIS — N3000 Acute cystitis without hematuria: Secondary | ICD-10-CM

## 2022-03-02 LAB — VITAMIN D 25 HYDROXY (VIT D DEFICIENCY, FRACTURES): Vit D, 25-Hydroxy: 101 ng/mL — ABNORMAL HIGH (ref 30–100)

## 2022-03-02 LAB — LIPID PANEL
Cholesterol: 138 mg/dL (ref <200)
HDL: 52 mg/dL (ref >=50)
LDL Cholesterol (Calc): 69 mg/dL (calc)
Non-HDL Cholesterol (Calc): 86 mg/dL (calc) (ref <130)
Total CHOL/HDL Ratio: 2.7 (calc) (ref <5.0)
Triglycerides: 91 mg/dL (ref <150)

## 2022-03-02 LAB — URINALYSIS, ROUTINE W REFLEX MICROSCOPIC
Bilirubin Urine: NEGATIVE
Glucose, UA: NEGATIVE
Ketones, ur: NEGATIVE
Nitrite: POSITIVE — AB
Specific Gravity, Urine: 1.011 (ref 1.001–1.035)
WBC, UA: >=60 /HPF — AB (ref 0–5)
pH: 7 (ref 5.0–8.0)

## 2022-03-02 LAB — COMPLETE METABOLIC PANEL WITH GFR
AG Ratio: 1.5 (calc) (ref 1.0–2.5)
ALT: 14 U/L (ref 6–29)
AST: 22 U/L (ref 10–35)
Albumin: 3.9 g/dL (ref 3.6–5.1)
Alkaline phosphatase (APISO): 75 U/L (ref 37–153)
BUN/Creatinine Ratio: 23 (calc) — ABNORMAL HIGH (ref 6–22)
BUN: 25 mg/dL (ref 7–25)
CO2: 26 mmol/L (ref 20–32)
Calcium: 8.8 mg/dL (ref 8.6–10.4)
Chloride: 104 mmol/L (ref 98–110)
Creat: 1.09 mg/dL — ABNORMAL HIGH (ref 0.60–0.95)
Globulin: 2.6 g/dL (calc) (ref 1.9–3.7)
Glucose, Bld: 108 mg/dL — ABNORMAL HIGH (ref 65–99)
Potassium: 4.2 mmol/L (ref 3.5–5.3)
Sodium: 137 mmol/L (ref 135–146)
Total Bilirubin: 0.4 mg/dL (ref 0.2–1.2)
Total Protein: 6.5 g/dL (ref 6.1–8.1)
eGFR: 49 mL/min/{1.73_m2} — ABNORMAL LOW (ref >=60)

## 2022-03-02 LAB — INSULIN, RANDOM: Insulin: 16.7 u[IU]/mL

## 2022-03-02 LAB — CBC WITH DIFFERENTIAL/PLATELET
Absolute Monocytes: 697 cells/uL (ref 200–950)
Basophils Absolute: 51 cells/uL (ref 0–200)
Basophils Relative: 0.5 %
Eosinophils Absolute: 101 cells/uL (ref 15–500)
Eosinophils Relative: 1 %
HCT: 32.2 % — ABNORMAL LOW (ref 35.0–45.0)
Hemoglobin: 10.7 g/dL — ABNORMAL LOW (ref 11.7–15.5)
Lymphs Abs: 1656 cells/uL (ref 850–3900)
MCH: 28.4 pg (ref 27.0–33.0)
MCHC: 33.2 g/dL (ref 32.0–36.0)
MCV: 85.4 fL (ref 80.0–100.0)
MPV: 11.8 fL (ref 7.5–12.5)
Monocytes Relative: 6.9 %
Neutro Abs: 7595 cells/uL (ref 1500–7800)
Neutrophils Relative %: 75.2 %
Platelets: 204 10*3/uL (ref 140–400)
RBC: 3.77 10*6/uL — ABNORMAL LOW (ref 3.80–5.10)
RDW: 14 % (ref 11.0–15.0)
Total Lymphocyte: 16.4 %
WBC: 10.1 10*3/uL (ref 3.8–10.8)

## 2022-03-02 LAB — MICROALBUMIN / CREATININE URINE RATIO
Creatinine, Urine: 54 mg/dL (ref 20–275)
Microalb Creat Ratio: 115 mcg/mg creat — ABNORMAL HIGH (ref <30)
Microalb, Ur: 6.2 mg/dL

## 2022-03-02 LAB — MICROSCOPIC MESSAGE

## 2022-03-02 LAB — HEMOGLOBIN A1C
Hgb A1c MFr Bld: 5.9 % of total Hgb — ABNORMAL HIGH (ref <5.7)
Mean Plasma Glucose: 123 mg/dL
eAG (mmol/L): 6.8 mmol/L

## 2022-03-02 LAB — MAGNESIUM: Magnesium: 1.8 mg/dL (ref 1.5–2.5)

## 2022-03-02 LAB — TSH: TSH: 1.04 mIU/L (ref 0.40–4.50)

## 2022-03-02 MED ORDER — NITROFURANTOIN MONOHYD MACRO 100 MG PO CAPS: ORAL_CAPSULE | ORAL | 0 refills | Status: DC

## 2022-03-03 ENCOUNTER — Encounter: Payer: Self-pay | Admitting: Internal Medicine

## 2022-03-07 ENCOUNTER — Encounter: Payer: Self-pay | Admitting: Internal Medicine

## 2022-03-09 ENCOUNTER — Encounter: Payer: Self-pay | Admitting: Gynecologic Oncology

## 2022-03-12 ENCOUNTER — Telehealth: Payer: Self-pay | Admitting: *Deleted

## 2022-03-12 ENCOUNTER — Telehealth: Payer: Self-pay | Admitting: Internal Medicine

## 2022-03-12 NOTE — Telephone Encounter (Signed)
FOR DOCUMENTATION ONLY:  Dr. Melford Aase received a letter from [facility] that they haven't been able to reach the pt to schedule a mammogram. Per Dr. Melford Aase I called the pt she said she was told that she was told she had fatty tissue that was unlikely for breast cancer, I explained that according to the report in epic that was not documented, only that the mammogram was marked as incomplete. She said she had no intention to go and get it done, despite Dr. Idell Pickles recommendation.

## 2022-03-12 NOTE — Telephone Encounter (Signed)
Returned the patient's call and rescheduled her appt from 10/3 to 10/27

## 2022-03-13 ENCOUNTER — Inpatient Hospital Stay: Payer: Medicare HMO | Admitting: Gynecologic Oncology

## 2022-03-13 DIAGNOSIS — C541 Malignant neoplasm of endometrium: Secondary | ICD-10-CM

## 2022-03-30 DIAGNOSIS — R928 Other abnormal and inconclusive findings on diagnostic imaging of breast: Secondary | ICD-10-CM | POA: Diagnosis not present

## 2022-03-30 DIAGNOSIS — N6002 Solitary cyst of left breast: Secondary | ICD-10-CM | POA: Diagnosis not present

## 2022-03-30 LAB — HM MAMMOGRAPHY

## 2022-04-02 ENCOUNTER — Other Ambulatory Visit: Payer: Self-pay | Admitting: Nurse Practitioner

## 2022-04-02 DIAGNOSIS — I10 Essential (primary) hypertension: Secondary | ICD-10-CM

## 2022-04-04 ENCOUNTER — Ambulatory Visit (INDEPENDENT_AMBULATORY_CARE_PROVIDER_SITE_OTHER): Payer: Medicare HMO

## 2022-04-04 DIAGNOSIS — N3000 Acute cystitis without hematuria: Secondary | ICD-10-CM | POA: Diagnosis not present

## 2022-04-04 NOTE — Progress Notes (Signed)
Patient presents to the office for a nurse visit to have her urine rechecked. Finished the antibiotics but is still experiencing burning when voiding. Blood pressure 118/60, pulse 72, temperature 97.8 F (36.6 C), height '5\' 2"'$  (1.575 m), weight 146 lb (66.2 kg), SpO2 99 %.

## 2022-04-05 ENCOUNTER — Ambulatory Visit: Payer: Medicare PPO

## 2022-04-05 ENCOUNTER — Encounter: Payer: Self-pay | Admitting: Gynecologic Oncology

## 2022-04-06 ENCOUNTER — Inpatient Hospital Stay: Payer: Medicare HMO | Admitting: Gynecologic Oncology

## 2022-04-07 ENCOUNTER — Other Ambulatory Visit: Payer: Self-pay | Admitting: Internal Medicine

## 2022-04-07 DIAGNOSIS — N3000 Acute cystitis without hematuria: Secondary | ICD-10-CM

## 2022-04-07 LAB — URINE CULTURE
MICRO NUMBER:: 14100439
SPECIMEN QUALITY:: ADEQUATE

## 2022-04-07 LAB — URINALYSIS, ROUTINE W REFLEX MICROSCOPIC
Bilirubin Urine: NEGATIVE
Glucose, UA: NEGATIVE
Hgb urine dipstick: NEGATIVE
Hyaline Cast: NONE SEEN /LPF
Ketones, ur: NEGATIVE
Nitrite: POSITIVE — AB
Protein, ur: NEGATIVE
RBC / HPF: NONE SEEN /HPF (ref 0–2)
Specific Gravity, Urine: 1.013 (ref 1.001–1.035)
Squamous Epithelial / HPF: NONE SEEN /HPF (ref <=5)
WBC, UA: >=60 /HPF — AB (ref 0–5)
pH: 5.5 (ref 5.0–8.0)

## 2022-04-07 LAB — MICROSCOPIC MESSAGE

## 2022-04-07 MED ORDER — CIPROFLOXACIN HCL 500 MG PO TABS: ORAL_TABLET | ORAL | 0 refills | Status: DC

## 2022-04-07 NOTE — Progress Notes (Signed)
<><><><><><><><><><><><><><><><><><><><><><><><><><><><><><><><><> <><><><><><><><><><><><><><><><><><><><><><><><><><><><><><><><><>  -   Urine culture shows still have a UTI , So sent a different Abx  &  - Please schedule a NV in 1 month to recheck U/A & C/S  <><><><><><><><><><><><><><><><><><><><><><><><><><><><><><><><><> <><><><><><><><><><><><><><><><><><><><><><><><><><><><><><><><><>

## 2022-04-24 ENCOUNTER — Telehealth: Payer: Self-pay | Admitting: Hematology and Oncology

## 2022-04-24 ENCOUNTER — Inpatient Hospital Stay: Payer: Medicare HMO | Admitting: Gynecologic Oncology

## 2022-04-24 ENCOUNTER — Telehealth: Payer: Self-pay | Admitting: *Deleted

## 2022-04-24 NOTE — Progress Notes (Unsigned)
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   09/04/2021 Imaging   CT A/P: IMPRESSION: 1. 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 No definitive findings to suggest metastatic disease to the chest, abdomen or pelvis. 2. Small left adrenal nodule, stable compared to prior study from 2015, considered benign (likely an adenoma). 3. Aortic atherosclerosis, in addition to 2 vessel coronary artery disease. 4. Additional incidental findings, as above.   09/08/2021 Initial Diagnosis   Endometrial cancer ([place])   10/18/2021 Surgery   Trh/BSO, bilateral SLN biopsy  Findings: On EUA, small mobile uterus. Normal upper abdominal survey on intra-abdominal entry. Normal omentum, small and large bowel. Significant diverticular disease. Normal adnexa. Uterus 10cm with some small subserosal fibroids. Atrophic adnexa. Mapping successful to right obturator and left external iliac SLN. Significant bladder prolapse. No intra-abdominal or pelvic evidence of disease. Cystoscopy performed given some difficulty developing bladder flap; dome intact, bilateral ureteral orifices.    10/18/2021 Pathologic Stage   Stage IB mixed clear cell (15%) and grade 1 endometrioid (85%). MI 60% No LVI Negative Slns    11/28/2021 Cancer Staging   Staging form: Corpus Uteri - Carcinoma and Carcinosarcoma, AJCC 8th Edition - Pathologic stage from 11/28/2021: Stage IB (pT1b, pN0, cM0) - Signed by Heath Lark, MD on 11/28/2021 Stage  prefix: Initial diagnosis     Interval History: ***  Past Medical/Surgical History: Past Medical History:  Diagnosis Date   Anemia    Anxiety    Arthritis    CKD (chronic kidney disease) stage 3, GFR 30-59 ml/min (HCC)    Colon polyp    Diverticulosis    Endometrial cancer ([place])    Hypertension    Hypothyroidism    Iron deficiency    Peripheral neuropathy    in bilateral legs   Plantar fasciitis    Pre-diabetes    Prediabetes    Pt denies   Small bowel obstruction ([place])     Past Surgical History:  Procedure Laterality Date   CATARACT EXTRACTION     CYSTOSCOPY  10/18/2021   Procedure: CYSTOSCOPY;  Surgeon: Lafonda Mosses, MD;  Location: [facility];  Service: Gynecology;;   EYE SURGERY     MACULAR TEAR     ROBOTIC ASSISTED TOTAL HYSTERECTOMY WITH BILATERAL SALPINGO OOPHERECTOMY N/A 10/18/2021   Procedure: XI ROBOTIC ASSISTED TOTAL HYSTERECTOMY WITH BILATERAL SALPINGO OOPHORECTOMY;  Surgeon: Lafonda Mosses, MD;  Location: [facility];  Service: Gynecology;  Laterality: N/A;   SENTINEL NODE BIOPSY N/A 10/18/2021   Procedure: SENTINEL NODE BIOPSY;  Surgeon: Lafonda Mosses, MD;  Location: [facility];  Service: Gynecology;  Laterality: N/A;   Vericose vein  1965    Family History  Problem Relation Age of Onset   Heart disease Mother    Stroke Mother    Heart disease Father    Colon polyps Sister    Diabetes Sister    Stroke Sister    Heart disease Brother    Stroke Brother    Cancer Maternal Aunt    Hypertension Son    Cancer [facility]  Kidney cancer Nephew     Social History   Socioeconomic History   Marital status: Widowed    Spouse name: Not on file   Number of children: Not on file   Years of education: Not on file   Highest education level: Not on file  Occupational History   Not on file  Tobacco Use   Smoking status: Former    Packs/day: 1.00    Years: 15.00    Total pack years: 15.00    Types: Cigarettes    Quit date: 06/11/1965    Years since  quitting: 56.9   Smokeless tobacco: Never  Vaping Use   Vaping Use: Never used  Substance and Sexual Activity   Alcohol use: No   Drug use: No   Sexual activity: Not Currently  Other Topics Concern   Not on file  Social History Narrative   Not on file   Social Determinants of Health   Financial Resource Strain: Not on file  Food Insecurity: Not on file  Transportation Needs: Not on file  Physical Activity: Not on file  Stress: Not on file  Social Connections: Not on file    Current Medications:  Current Outpatient Medications:    amLODipine (NORVASC) 10 MG tablet, TAKE 1/2 TO 1 (ONE-HALF TO ONE) TABLET BY MOUTH ONCE DAILY FOR BLOOD PRESSURE, Disp: 90 tablet, Rfl: 0   Ascorbic Acid (VITAMIN C PO), Take 1,000 mg by mouth daily., Disp: , Rfl:    benazepril (LOTENSIN) 40 MG tablet, Take 1 tablet by mouth once daily for blood pressure, Disp: 90 tablet, Rfl: 0   Cholecalciferol (VITAMIN D3) 125 MCG (5000 UT) TABS, Take 5,000 Units by mouth daily., Disp: , Rfl:    ciprofloxacin (CIPRO) 500 MG tablet, Take  1 tablet  2 x /day  with Food  for UTI, Disp: 20 tablet, Rfl: 0   clorazepate (TRANXENE) 7.5 MG tablet, Take 1/2 - 1 tablet 2 - 3 x /day ONLY if needed for Anxiety Attack &  limit to 5 days /week to avoid Addiction & Dementia (Patient taking differently: Take 1.875 mg by mouth daily as needed for anxiety. Take 1/2 - 1 tablet 2 - 3 x /day ONLY if needed for Anxiety Attack &  limit to 5 days /week to avoid Addiction & Dementia), Disp: 30 tablet, Rfl: 0   clotrimazole (LOTRIMIN) 1 % cream, Apply 1 application topically 2 (two) times daily., Disp: 30 g, Rfl: 0   Cyanocobalamin (VITAMIN B-12) 2500 MCG SUBL, Take 2,500 mcg by mouth daily., Disp: , Rfl:    fluticasone (FLONASE) 50 MCG/ACT nasal spray, Place 2 sprays into both nostrils at bedtime., Disp: 16 g, Rfl: 1   Magnesium 250 MG TABS, Take 1 tablet by mouth daily., Disp: , Rfl:    meclizine (ANTIVERT) 25 MG tablet, Take 1/2 to 1  3 x   /day as needed for Dizziness / Vertigo, Disp: 90 tablet, Rfl: 3   SYNTHROID 150 MCG tablet, Take  1 tablet  Daily  on an empty stomach with only water for 30 minutes & no Antacid meds, Calcium or Magnesium for 4 hours & avoid Biotin, Disp: 90 tablet, Rfl: 3   zinc gluconate 50 MG tablet, Take 50 mg by mouth daily., Disp: , Rfl:   Review of Systems: Denies appetite changes, fevers, chills, fatigue, unexplained weight changes. Denies hearing loss, neck lumps or masses, mouth sores, ringing in ears or voice changes. Denies cough or wheezing.  Denies shortness of breath. Denies  chest pain or palpitations. Denies leg swelling. Denies abdominal distention, pain, blood in stools, constipation, diarrhea, nausea, vomiting, or early satiety. Denies pain with intercourse, dysuria, frequency, hematuria or incontinence. Denies hot flashes, pelvic pain, vaginal bleeding or vaginal discharge.   Denies joint pain, back pain or muscle pain/cramps. Denies itching, rash, or wounds. Denies dizziness, headaches, numbness or seizures. Denies swollen lymph nodes or glands, denies easy bruising or bleeding. Denies anxiety, depression, confusion, or decreased concentration.  Physical Exam: There were no vitals taken for this visit. General: ***Alert, oriented, no acute distress. HEENT: ***Posterior oropharynx clear, sclera anicteric. Chest: ***Clear to auscultation bilaterally.  ***Port site clean. Cardiovascular: ***Regular rate and rhythm, no murmurs. Abdomen: ***Obese, soft, nontender.  Normoactive bowel sounds.  No masses or hepatosplenomegaly appreciated.  ***Well-healed scar. Extremities: ***Grossly normal range of motion.  Warm, well perfused.  No edema bilaterally. Skin: ***No rashes or lesions noted. Lymphatics: ***No cervical, supraclavicular, or inguinal adenopathy. GU: Normal appearing external genitalia without erythema, excoriation, or lesions.  Speculum exam reveals ***.  Bimanual exam reveals ***.   ***Rectovaginal exam  confirms ___.  Laboratory & Radiologic Studies: ***  Assessment & Plan: Mikayla Harrison is a 88 y.o. 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 Surgery in 10/2021. Declined adjuvant therapy. MSI-H, MMR abn, MLH1 promoter hypermethylation absent.    Patient is overall doing very well and meeting postoperative milestones.  Discussed continued expectations and restrictions.   We reviewed her pathology in detail again.  The majority of her tumor is a low-grade endometrioid type, but there is a small component that is high grade (clear cell).  We discussed her case at tumor board.  We discussed various options for treatment ranging from vaginal brachytherapy only, to pelvic radiation, to chemotherapy with vaginal brachytherapy.  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  Patients with clear cell cancer made up a small subset of this group, but those with MMR deficient tumors and a 5-year recurrence free survival that was not statistically different between chemotherapy with radiation versus radiation alone.  The patient had wanted to speak with me before being scheduled to see either medical or radiation oncology.  After our discussion today, she is amenable to meeting with Drs. Kinard and [place].  She and I will have a phone visit in a couple of weeks to finalize her treatment plan.  I have encouraged her to at minimum consider vaginal brachytherapy.   With regard to her left lower leg edema, she has had this since at least March.  There is not been any significant change.  She has no calf tenderness to palpation and we obtained a lower extremity Doppler at the end of March which was negative for any evidence of DVT.  I encouraged her to reach out to her primary care  provider.  The patient has knee issues on that leg as well as Planter fasciitis.  She may benefit from additional assessment of her musculoskeletal issues.  ***  *** minutes of total time was spent for this patient encounter, including preparation, face-to-face counseling with the patient and coordination of care, and documentation of the encounter.  Jeral Pinch, MD  Division of Gynecologic Oncology  Department of Obstetrics and Gynecology  [facility]

## 2022-04-24 NOTE — Telephone Encounter (Signed)
Patient called and rescheduled her appt from today to 12/1 due to illness

## 2022-04-24 NOTE — Telephone Encounter (Signed)
Attempted to reach patient to notify of upcoming appointments. No voicemail left, mailing calendar

## 2022-05-07 ENCOUNTER — Ambulatory Visit: Payer: Medicare PPO

## 2022-05-07 DIAGNOSIS — N3000 Acute cystitis without hematuria: Secondary | ICD-10-CM

## 2022-05-07 NOTE — Progress Notes (Signed)
Patient here to recheck her urine for UTI after completion of her antibiotics. She states she is still having some burning and frequency with urination. Denies any fever, chills, back pain, and abdominal pain.

## 2022-05-08 LAB — URINE CULTURE
MICRO NUMBER:: 14234197
Result:: NO GROWTH
SPECIMEN QUALITY:: ADEQUATE

## 2022-05-08 LAB — URINALYSIS, ROUTINE W REFLEX MICROSCOPIC
Bilirubin Urine: NEGATIVE
Glucose, UA: NEGATIVE
Hgb urine dipstick: NEGATIVE
Ketones, ur: NEGATIVE
Leukocytes,Ua: NEGATIVE
Nitrite: NEGATIVE
Protein, ur: NEGATIVE
Specific Gravity, Urine: 1.013 (ref 1.001–1.035)
pH: 5.5 (ref 5.0–8.0)

## 2022-05-10 ENCOUNTER — Other Ambulatory Visit: Payer: Self-pay | Admitting: Internal Medicine

## 2022-05-10 DIAGNOSIS — E039 Hypothyroidism, unspecified: Secondary | ICD-10-CM

## 2022-05-11 ENCOUNTER — Telehealth: Payer: Self-pay | Admitting: Surgery

## 2022-05-11 ENCOUNTER — Inpatient Hospital Stay: Payer: Medicare HMO | Admitting: Gynecologic Oncology

## 2022-05-11 DIAGNOSIS — C541 Malignant neoplasm of endometrium: Secondary | ICD-10-CM

## 2022-05-11 NOTE — Telephone Encounter (Signed)
Patient called to cancel appointment for today and stated she would call back Monday to reschedule.

## 2022-05-31 DIAGNOSIS — H26492 Other secondary cataract, left eye: Secondary | ICD-10-CM | POA: Diagnosis not present

## 2022-05-31 DIAGNOSIS — H52203 Unspecified astigmatism, bilateral: Secondary | ICD-10-CM | POA: Diagnosis not present

## 2022-06-09 ENCOUNTER — Other Ambulatory Visit: Payer: Self-pay | Admitting: Internal Medicine

## 2022-06-09 DIAGNOSIS — N3000 Acute cystitis without hematuria: Secondary | ICD-10-CM

## 2022-06-18 NOTE — Progress Notes (Deleted)
MEDICARE ANNUAL WELLNESS VISIT AND FOLLOW UP   Assessment:     Encounter for Medicare annual wellness exam Yearly  Essential hypertension - continue medications, DASH diet, exercise and monitor at home. Call if greater than 130/80.  -check BP in the morning and at 9 oclock - taking benazepril and bASA in the morning, take norvasc 1/2 at 7 without checking BP before that - if your BP is over 140/80 at 10 pm, you can take 1/2 of the atenolol  Hyperlipidemia At goal without medications at this time;  Continue low cholesterol diet and exercise.  Defer lipid panel as would not aggressively treat secondary to age  Abnormal glucose Discussed disease and risks Discussed diet/exercise, weight management  Defer A1C- check CMP for glucose  CKD Stage IIIa (GFR 56 ml/min) Increase fluids, avoid NSAIDS, monitor sugars, will monitor - CMP/GFR  Hypothyroidism, unspecified hypothyroidism type -check TSH level, continue medications the same, reminded to take on an empty stomach 30-15mns before food.    Diverticulosis of intestine without bleeding, unspecified intestinal tract location Controlled, diet   Raynaud phenomenon Declines meds at this time  Overweight Long discussion about weight loss, diet, and exercise Recommended diet heavy in fruits and veggies and low in animal meats, cheeses, and dairy products, appropriate calorie intake Discussed appropriate weight for height Follow up at next visit  History of TIA Control B/P, lipids, glucose bASA  Bone spur of left foot Encouraged to follow up with orthopedics Encouraged supportive shoes  Endometrial Cancer(HCC) Continue to follow with Dr. TBerline Lopes GYN ONC  Edema of lower extremities Encourage compression socks and elevating feet above heart level as much as possible Limit salt intake and push fluids  Estrogen Deficiency DEXA UTD   Vitamin D deficiency Continue supplementation to maintain goal of 70-100 Taking Vitamin  D 2,000 IU daily Check vitamin D level  Unifocal PVCs  Monitor yearly, check labs  Medication management Continued  Right Eustachian tube dysfunction Mucinex twice a day  Further disposition pending results if labs check today. Discussed med's effects and SE's.   Over 30 minutes of face to face interview, exam, counseling, chart review, and critical decision making was performed.    Future Appointments  Date Time Provider [place] 06/19/2022 11:00 AM WAlycia Rossetti NP [facility] None  06/27/2022  1:00 PM PClarene Essex Counselor [facility] None  06/27/2022  2:00 PM [facility] LAB [facility] None  09/19/2022  2:30 PM MUnk Pinto MD [facility] None  03/06/2023  3:00 PM MUnk Pinto MD [facility] None  06/13/2023 11:00 AM WAlycia Rossetti NP [facility] None     Plan:   During the course of the visit the patient was educated and counseled about appropriate screening and preventive services including:   Pneumococcal vaccine  Influenza vaccine Td vaccine Screening electrocardiogram Screening mammography Bone densitometry screening Colorectal cancer screening Diabetes screening Glaucoma screening Nutrition counseling  Advanced directives: given info/requested   Subjective:   RORVILLE WIDMANNis a 89y.o. female who presents for Medicare Annual Wellness Visit and 3 month follow up on hypertension, prediabetes, hyperlipidemia, vitamin D def.   She has had ongoing ear pain at her jaw, causes dizziness, states if she opens and closes her mouth she can "pop" her ear and make it feel better/relieve the issues.    Plantar fascitis and she has bone spurs on the left foot.  She has been to Foot specialist, Dr WEarleen Newport She has used Gabapentin but the medication makes her sick and can  not use. Dr Mardelle Matte has put shots in her knees for arthritis  She has noted a rash under her breast for approximately 6 weeks, very itchy  Has tried hydrocortisone with no relief    JANITH NIELSON is a 89 y.o. 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 Surgery in 10/2021. Declined adjuvant therapy. MSI-H, MMR abn, MLH1 promoter hypermethylation absent.  Patient is overall doing very well and meeting postoperative milestones.  Discussed continued expectations and restrictions.  Continues to have swelling in her right lower leg. She has a history of CREST syndrome, may need to follow up with rheumatology.   She lives by herself, has a lot of anxiety at night, has clorazepate if needed, takes 1/2-1/4 tab occasionally.    BMI is There is no height or weight on file to calculate BMI., she has not been working on diet and exercise. Wt Readings from Last 3 Encounters:  05/07/22 149 lb 9.6 oz (67.9 kg)  04/04/22 146 lb (66.2 kg)  03/01/22 145 lb 3.2 oz (65.9 kg)   Her blood pressure has been controlled at home, today their BP is  .  BP Readings from Last 3 Encounters:  05/07/22 (!) 110/52  04/04/22 118/60  03/01/22 (!) 122/50   She was switched from ACE to ARB due to cough however she has not switched and states cough got better with ABX/prednisone.  She checks her BP in the AM, at 5 pm occ will do 1/4 of atenolol or 1/2 of amlodipine, then she will check it at 8 and it will begin to accerate   She does not workout but stays active around the house. She denies chest pain, shortness of breath, dizziness.   She is not on cholesterol medication and denies myalgias. Her cholesterol is at goal. The cholesterol last visit was:   Lab Results  Component Value Date   CHOL 138 03/01/2022   HDL 52 03/01/2022   LDLCALC 69 03/01/2022   TRIG 91 03/01/2022   CHOLHDL 2.7 03/01/2022    She has CKD stage III due to HTN. Lab Results  Component Value Date   GFRNONAA >60 10/19/2021   She has been working on diet and exercise for prediabetes, and denies paresthesia of the feet, polydipsia, polyuria and visual  disturbances. She is on bASA.  Last A1C in the office was:  Lab Results  Component Value Date   HGBA1C 5.9 (H) 03/01/2022   Patient is on Vitamin D supplement. Lab Results  Component Value Date   VD25OH 101 (H) 03/01/2022     She is on thyroid medication. Her medication was not changed last visit.   Lab Results  Component Value Date   TSH 1.04 03/01/2022   Patient was held overnight for observation in May 2018 with a (-) Head CT, MRI & carotid dopplers and she was discharged home on LD bASA for suspected possible TIA.  Medication Review Current Outpatient Medications on File Prior to Visit  Medication Sig   amLODipine (NORVASC) 10 MG tablet TAKE 1/2 TO 1 (ONE-HALF TO ONE) TABLET BY MOUTH ONCE DAILY FOR BLOOD PRESSURE   Ascorbic Acid (VITAMIN C PO) Take 1,000 mg by mouth daily.   benazepril (LOTENSIN) 40 MG tablet Take 1 tablet by mouth once daily for blood pressure   Cholecalciferol (VITAMIN D3) 125 MCG (5000 UT) TABS Take 5,000 Units by mouth daily.   ciprofloxacin (CIPRO) 500 MG tablet Take  1 tablet  2 x /day  with Food  for UTI   clorazepate (TRANXENE) 7.5 MG tablet Take 1/2 - 1 tablet 2 - 3 x /day ONLY if needed for Anxiety Attack &  limit to 5 days /week to avoid Addiction & Dementia (Patient taking differently: Take 1.875 mg by mouth daily as needed for anxiety. Take 1/2 - 1 tablet 2 - 3 x /day ONLY if needed for Anxiety Attack &  limit to 5 days /week to avoid Addiction & Dementia)   clotrimazole (LOTRIMIN) 1 % cream Apply 1 application topically 2 (two) times daily.   Cyanocobalamin (VITAMIN B-12) 2500 MCG SUBL Take 2,500 mcg by mouth daily.   fluticasone (FLONASE) 50 MCG/ACT nasal spray Place 2 sprays into both nostrils at bedtime.   Magnesium 250 MG TABS Take 1 tablet by mouth daily.   meclizine (ANTIVERT) 25 MG tablet Take 1/2 to 1  3 x  /day as needed for Dizziness / Vertigo   SYNTHROID 150 MCG tablet TAKE 1 TABLET DAILY ON AN EMPTY STOMACH WITH ONLY WATER FOR 30 MINUTES &  NO ANTIACID MEDS, CALCIUM OR MAGNESIUM FOR 4 HOURS & AVOID BIOTIN   zinc gluconate 50 MG tablet Take 50 mg by mouth daily.   No current facility-administered medications on file prior to visit.    Current Problems (verified) Patient Active Problem List   Diagnosis Date Noted   Endometrial cancer ([place]) 09/08/2021   Edema of left lower extremity 09/08/2021   [place] pain 08/04/2020   Enrolled in chronic care management 11/14/2018   Normocytic anemia 06/10/2018   CREST syndrome ([place]) 02/12/2018   Abnormal glucose 10/10/2017   FHx: heart disease 10/10/2017   History of TIA (transient ischemic attack) 11/06/2016   Raynaud phenomenon 08/01/2015   Overweight (BMI 25.0-29.9) 03/22/2015   Unifocal PVCs 11/02/2014   CKD (chronic kidney disease) stage 3, GFR 30-59 ml/min (HCC) 03/16/2014   Hyperlipidemia, mixed 10/05/2013   Screening for colorectal cancer 10/05/2013   Vitamin D deficiency 10/05/2013   Prediabetes    Diverticulosis    Essential hypertension 01/14/2012   Hypothyroidism 01/14/2012    Screening Tests Immunization History  Administered Date(s) Administered   DT (Pediatric) 03/22/2015   Influenza Split 03/04/2013   Influenza, High Dose Seasonal PF 03/16/2014, 03/22/2015, 02/15/2016, 04/23/2017, 04/30/2018, 04/08/2019, 06/19/2021   Pneumococcal Polysaccharide-23 03/16/2014   Td 10/10/2017    Preventative care: Last colonoscopy:  2004, will not get another EGD: 2012, Dr. Henrene Pastor Last mammogram: 2011, declines another and declines breast exam at this time.  Last pap smear/pelvic exam: remote DEXA:remote at Dr. Delanna Ahmadi office, willing to get  Prior vaccinations: TD or [id number] Influenza: 06/19/21 Pneumococcal: 03/2014 Prevnar13:  03/2015 Shingles/Zostavax: declines  Names of Other Physician/Practitioners you currently use: 1. [facility]- here for primary care 2. Dr. Delman Cheadle, eye doctor, 2021 3. , dentist, no- has  dentures  Patient Care Team: [name], MD as PCP - General (Internal Medicine)  Allergies Allergies  Allergen Reactions   Sulfa Antibiotics Other (See Comments)   Codeine Nausea Only   Prednisone Nausea And Vomiting, Nausea Only and Other (See Comments)   Asa [Aspirin] Anxiety and Other (See Comments)    Take '81mg'$  cannot take '325mg'$  gets nervous    SURGICAL HISTORY She  has a past surgical history that includes Cataract extraction; Vericose vein (1965); [place]; Eye surgery; Robotic assisted total hysterectomy with bilateral salpingo oophorectomy (N/A, 10/18/2021); Sentinel node biopsy (N/A, 10/18/2021); and Cystoscopy (10/18/2021). FAMILY HISTORY Her family  history includes Cancer in her maternal aunt and nephew; Colon polyps in her sister; Diabetes in her sister; Heart disease in her brother, father, and mother; Hypertension in her son; Kidney cancer in her nephew; Stroke in her brother, mother, and sister. SOCIAL HISTORY She  reports that she quit smoking about 57 years ago. Her smoking use included cigarettes. She has a 15.00 pack-year smoking history. She has never used smokeless tobacco. She reports that she does not drink alcohol and does not use drugs.  MEDICARE WELLNESS OBJECTIVES: Physical activity: yard work and walking Depression/mood screen:      03/03/2022    8:42 PM  Depression screen PHQ 2/9  Decreased Interest 0  Down, Depressed, Hopeless 0  PHQ - 2 Score 0   ADLs:     03/03/2022    8:43 PM 10/18/2021   10:00 PM  In your present state of health, do you have any difficulty performing the following activities:  Hearing? 1 0  Comment hearing aids   Vision? 0 0  Difficulty concentrating or making decisions? 0 0  Walking or climbing stairs? 0 0  Comment cane / walker   Dressing or bathing? 0 0  Doing errands, shopping? 0     Cognitive Testing  Alert? Yes  Normal Appearance?Yes  Oriented to person? Yes  Place? Yes   Time? Yes  Recall of three  objects?  Yes  Can perform simple calculations? Yes  Displays appropriate judgment?Yes  Can read the correct time from a watch face?Yes  EOL planning:   No, declined information   Objective:   There were no vitals taken for this visit. There is no height or weight on file to calculate BMI.  General Appearance: Well nourished, in no apparent distress. Eyes: PERRLA, EOMs, conjunctiva no swelling or erythema Sinuses: No Frontal/maxillary tenderness ENT/Mouth: Ext aud canals clear, Tms fluid Right ear, no drainage or erythema. No erythema, swelling, or exudate on post pharynx.  Tonsils not swollen or erythematous. Hearing normal.  Neck: Supple, thyroid normal.  Respiratory: Respiratory effort normal, BS equal bilaterally without rales, rhonchi, wheezing or stridor.  Cardio: RRR with no MRGs. Brisk peripheral pulses.2+ pitting edema of LLE, 1+ nonpitting edema RLE Abdomen: Soft, + BS.  Non tender, no guarding, rebound, hernias, masses. Lymphatics: Non tender without lymphadenopathy.  Musculoskeletal: Full ROM, 5/5 strength, Normal gait Skin: Warm, dry . Red irregular border rash under breasts bilaterally  Neuro: Cranial nerves intact. No cerebellar symptoms.  Psych: Awake and oriented X 3, normal affect, Insight and Judgment appropriate.    Medicare Attestation I have personally reviewed: The patient's medical and social history Their use of alcohol, tobacco or illicit drugs Their current medications and supplements The patient's functional ability including ADLs,fall risks, home safety risks, cognitive, and hearing and visual impairment Diet and physical activities Evidence for depression or mood disorders  The patient's weight, height, BMI, and visual acuity have been recorded in the chart.  I have made referrals, counseling, and provided education to the patient based on review of the above and I have provided the patient with a written personalized care plan for preventive  services.       [name] ANP-C  [facility]  06/18/2022

## 2022-06-19 ENCOUNTER — Ambulatory Visit: Payer: Medicare HMO | Admitting: Nurse Practitioner

## 2022-06-19 DIAGNOSIS — N1831 Chronic kidney disease, stage 3a: Secondary | ICD-10-CM

## 2022-06-19 DIAGNOSIS — R710 Precipitous drop in hematocrit: Secondary | ICD-10-CM

## 2022-06-19 DIAGNOSIS — E559 Vitamin D deficiency, unspecified: Secondary | ICD-10-CM

## 2022-06-19 DIAGNOSIS — I10 Essential (primary) hypertension: Secondary | ICD-10-CM

## 2022-06-19 DIAGNOSIS — Z79899 Other long term (current) drug therapy: Secondary | ICD-10-CM

## 2022-06-19 DIAGNOSIS — R600 Localized edema: Secondary | ICD-10-CM

## 2022-06-19 DIAGNOSIS — C541 Malignant neoplasm of endometrium: Secondary | ICD-10-CM

## 2022-06-19 DIAGNOSIS — E2839 Other primary ovarian failure: Secondary | ICD-10-CM

## 2022-06-19 DIAGNOSIS — Z0000 Encounter for general adult medical examination without abnormal findings: Secondary | ICD-10-CM

## 2022-06-19 DIAGNOSIS — E782 Mixed hyperlipidemia: Secondary | ICD-10-CM

## 2022-06-19 DIAGNOSIS — K5790 Diverticulosis of intestine, part unspecified, without perforation or abscess without bleeding: Secondary | ICD-10-CM

## 2022-06-19 DIAGNOSIS — E039 Hypothyroidism, unspecified: Secondary | ICD-10-CM

## 2022-06-19 DIAGNOSIS — E663 Overweight: Secondary | ICD-10-CM

## 2022-06-19 DIAGNOSIS — R7309 Other abnormal glucose: Secondary | ICD-10-CM

## 2022-06-19 DIAGNOSIS — Z8673 Personal history of transient ischemic attack (TIA), and cerebral infarction without residual deficits: Secondary | ICD-10-CM

## 2022-06-19 DIAGNOSIS — I7300 Raynaud's syndrome without gangrene: Secondary | ICD-10-CM

## 2022-06-19 DIAGNOSIS — I493 Ventricular premature depolarization: Secondary | ICD-10-CM

## 2022-06-25 ENCOUNTER — Encounter: Payer: Self-pay | Admitting: Genetic Counselor

## 2022-06-25 DIAGNOSIS — H26492 Other secondary cataract, left eye: Secondary | ICD-10-CM | POA: Diagnosis not present

## 2022-06-25 DIAGNOSIS — Z961 Presence of intraocular lens: Secondary | ICD-10-CM | POA: Diagnosis not present

## 2022-06-27 ENCOUNTER — Inpatient Hospital Stay: Payer: Medicare HMO | Admitting: Genetic Counselor

## 2022-06-27 ENCOUNTER — Inpatient Hospital Stay: Payer: Medicare HMO

## 2022-06-27 ENCOUNTER — Encounter: Payer: Self-pay | Admitting: Gynecologic Oncology

## 2022-06-27 ENCOUNTER — Telehealth: Payer: Self-pay | Admitting: *Deleted

## 2022-06-27 NOTE — Telephone Encounter (Signed)
Per Dr Berline Lopes patient scheduled for a follow up appt with Dr Berline Lopes on 1/19

## 2022-06-29 ENCOUNTER — Other Ambulatory Visit: Payer: Self-pay

## 2022-06-29 ENCOUNTER — Inpatient Hospital Stay: Payer: Medicare HMO | Attending: Gynecologic Oncology | Admitting: Gynecologic Oncology

## 2022-06-29 ENCOUNTER — Encounter: Payer: Self-pay | Admitting: Gynecologic Oncology

## 2022-06-29 VITALS — BP 142/62 | HR 81 | Temp 99.0°F | Resp 14 | Wt 144.0 lb

## 2022-06-29 DIAGNOSIS — C541 Malignant neoplasm of endometrium: Secondary | ICD-10-CM

## 2022-06-29 DIAGNOSIS — R1031 Right lower quadrant pain: Secondary | ICD-10-CM

## 2022-06-29 DIAGNOSIS — R102 Pelvic and perineal pain: Secondary | ICD-10-CM | POA: Diagnosis not present

## 2022-06-29 DIAGNOSIS — Z90710 Acquired absence of both cervix and uterus: Secondary | ICD-10-CM | POA: Insufficient documentation

## 2022-06-29 DIAGNOSIS — Z90722 Acquired absence of ovaries, bilateral: Secondary | ICD-10-CM | POA: Insufficient documentation

## 2022-06-29 DIAGNOSIS — Z8542 Personal history of malignant neoplasm of other parts of uterus: Secondary | ICD-10-CM

## 2022-06-29 DIAGNOSIS — R300 Dysuria: Secondary | ICD-10-CM | POA: Diagnosis not present

## 2022-06-29 NOTE — Progress Notes (Signed)
Gynecologic Oncology Return Clinic Visit  06/29/22  Reason for Visit: Follow-up in the setting of high risk uterine cancer  Treatment History: Oncology History Overview Note  MMR IHC - loss of MLH1 and PMS2 MSI-H MLH1 promoter hypermethylation ABSENT   Endometrial cancer ([place])  08/21/2021 Initial Biopsy   Endometrial biopsy shows adenocarcinoma with mixed clear cell and endometrioid, low-grade.   09/04/2021 Imaging   CT A/P: IMPRESSION: 1. 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 No definitive findings to suggest metastatic disease to the chest, abdomen or pelvis. 2. Small left adrenal nodule, stable compared to prior study from 2015, considered benign (likely an adenoma). 3. Aortic atherosclerosis, in addition to 2 vessel coronary artery disease. 4. Additional incidental findings, as above.   09/08/2021 Initial Diagnosis   Endometrial cancer ([place])   10/18/2021 Surgery   Trh/BSO, bilateral SLN biopsy  Findings: On EUA, small mobile uterus. Normal upper abdominal survey on intra-abdominal entry. Normal omentum, small and large bowel. Significant diverticular disease. Normal adnexa. Uterus 10cm with some small subserosal fibroids. Atrophic adnexa. Mapping successful to right obturator and left external iliac SLN. Significant bladder prolapse. No intra-abdominal or pelvic evidence of disease. Cystoscopy performed given some difficulty developing bladder flap; dome intact, bilateral ureteral orifices.    10/18/2021 Pathologic Stage   Stage IB mixed clear cell (15%) and grade 1 endometrioid (85%). MI 60% No LVI Negative Slns    11/28/2021 Cancer Staging   Staging form: Corpus Uteri - Carcinoma and Carcinosarcoma, AJCC 8th Edition - Pathologic stage from 11/28/2021: Stage IB (pT1b, pN0, cM0) - Signed by Heath Lark, MD on  11/28/2021 Stage prefix: Initial diagnosis     Interval History: Patient presents today for follow-up.  She describes couple of weeks of right lower quadrant pain as well as some urinary symptoms.  She describes the pain in her pelvis as "poking sensation", rates it at a 6 out of 10 on a pain scale.  She has not needed to take any medications for this pain.  She notes less pain in the morning, more when she is up moving around during the day.  Denies any associated symptoms.  She denies any fevers.  Has occasional nausea related to vertigo, denies any change in symptoms recently.  Denies any emesis.  Endorses a good appetite.  She reports baseline bowel function.  Has had a couple weeks of dysuria and feels that her stream is slower.  Past Medical/Surgical History: Past Medical History:  Diagnosis Date   Anemia    Anxiety    Arthritis    CKD (chronic kidney disease) stage 3, GFR 30-59 ml/min (HCC)    Colon polyp    Diverticulosis    Endometrial cancer ([place])    Hypertension    Hypothyroidism    Iron deficiency    Peripheral neuropathy    in bilateral legs   Plantar fasciitis    Pre-diabetes    Prediabetes    Pt denies   Small bowel obstruction [facility])     Past Surgical History:  Procedure Laterality Date   CATARACT EXTRACTION     CYSTOSCOPY  10/18/2021   Procedure: CYSTOSCOPY;  Surgeon: Lafonda Mosses, MD;  Location: [facility];  Service: Gynecology;;   EYE SURGERY     MACULAR TEAR     ROBOTIC ASSISTED TOTAL HYSTERECTOMY WITH BILATERAL SALPINGO OOPHERECTOMY N/A 10/18/2021   Procedure: XI ROBOTIC ASSISTED  TOTAL HYSTERECTOMY WITH BILATERAL SALPINGO OOPHORECTOMY;  Surgeon: Lafonda Mosses, MD;  Location: [facility];  Service: Gynecology;  Laterality: N/A;   SENTINEL NODE BIOPSY N/A 10/18/2021   Procedure: SENTINEL NODE BIOPSY;  Surgeon: Lafonda Mosses, MD;  Location: [facility];  Service: Gynecology;  Laterality: N/A;   Vericose vein  1965    Family History  Problem Relation  Age of Onset   Heart disease Mother    Stroke Mother    Heart disease Father    Colon polyps Sister    Diabetes Sister    Stroke Sister    Heart disease Brother    Stroke Brother    Cancer Maternal Aunt    Hypertension Son    Kidney cancer Nephew    Breast cancer Neg Hx    Ovarian cancer Neg Hx    Endometrial cancer Neg Hx    Pancreatic cancer Neg Hx    Prostate cancer Neg Hx     Social History   Socioeconomic History   Marital status: Widowed    Spouse name: Not on file   Number of children: Not on file   Years of education: Not on file   Highest education level: Not on file  Occupational History   Not on file  Tobacco Use   Smoking status: Former    Packs/day: 1.00    Years: 15.00    Total pack years: 15.00    Types: Cigarettes    Quit date: 06/11/1965    Years since quitting: 57.0   Smokeless tobacco: Never  Vaping Use   Vaping Use: Never used  Substance and Sexual Activity   Alcohol use: No   Drug use: No   Sexual activity: Not Currently  Other Topics Concern   Not on file  Social History Narrative   Not on file   Social Determinants of Health   Financial Resource Strain: Not on file  Food Insecurity: Not on file  Transportation Needs: Not on file  Physical Activity: Not on file  Stress: Not on file  Social Connections: Not on file    Current Medications:  Current Outpatient Medications:    amLODipine (NORVASC) 10 MG tablet, TAKE 1/2 TO 1 (ONE-HALF TO ONE) TABLET BY MOUTH ONCE DAILY FOR BLOOD PRESSURE, Disp: 90 tablet, Rfl: 0   Ascorbic Acid (VITAMIN C PO), Take 1,000 mg by mouth daily., Disp: , Rfl:    benazepril (LOTENSIN) 40 MG tablet, Take 1 tablet by mouth once daily for blood pressure, Disp: 90 tablet, Rfl: 0   Cholecalciferol (VITAMIN D3) 125 MCG (5000 UT) TABS, Take 5,000 Units by mouth daily., Disp: , Rfl:    ciprofloxacin (CIPRO) 500 MG tablet, Take  1 tablet  2 x /day  with Food  for UTI, Disp: 20 tablet, Rfl: 0   clorazepate (TRANXENE)  7.5 MG tablet, Take 1/2 - 1 tablet 2 - 3 x /day ONLY if needed for Anxiety Attack &  limit to 5 days /week to avoid Addiction & Dementia (Patient taking differently: Take 1.875 mg by mouth daily as needed for anxiety. Take 1/2 - 1 tablet 2 - 3 x /day ONLY if needed for Anxiety Attack &  limit to 5 days /week to avoid Addiction & Dementia), Disp: 30 tablet, Rfl: 0   clotrimazole (LOTRIMIN) 1 % cream, Apply 1 application topically 2 (two) times daily., Disp: 30 g, Rfl: 0   Cyanocobalamin (VITAMIN B-12) 2500 MCG SUBL, Take 2,500 mcg by mouth daily., Disp: , Rfl:  Magnesium 250 MG TABS, Take 1 tablet by mouth daily., Disp: , Rfl:    meclizine (ANTIVERT) 25 MG tablet, Take 1/2 to 1  3 x  /day as needed for Dizziness / Vertigo, Disp: 90 tablet, Rfl: 3   SYNTHROID 150 MCG tablet, TAKE 1 TABLET DAILY ON AN EMPTY STOMACH WITH ONLY WATER FOR 30 MINUTES & NO ANTIACID MEDS, CALCIUM OR MAGNESIUM FOR 4 HOURS & AVOID BIOTIN, Disp: 90 tablet, Rfl: 0   zinc gluconate 50 MG tablet, Take 50 mg by mouth daily., Disp: , Rfl:   Review of Systems: Pertinent positives as per HPI. Denies appetite changes, fevers, chills, fatigue, unexplained weight changes. Denies hearing loss, neck lumps or masses, mouth sores, ringing in ears or voice changes. Denies cough or wheezing.  Denies shortness of breath. Denies chest pain or palpitations. Denies leg swelling. Denies abdominal distention, blood in stools, constipation, diarrhea, nausea, vomiting, or early satiety. Denies pain with intercourse, frequency, hematuria or incontinence. Denies hot flashes, pelvic pain, vaginal bleeding or vaginal discharge.   Denies joint pain, back pain or muscle pain/cramps. Denies itching, rash, or wounds. Denies dizziness, headaches, numbness or seizures. Denies swollen lymph nodes or glands, denies easy bruising or bleeding. Denies anxiety, depression, confusion, or decreased concentration.  Physical Exam: BP (!) 142/62 (BP Location: Left  Arm, Patient Position: Sitting)   Pulse 81   Temp 99 F (37.2 C) (Oral)   Resp 14   Wt 144 lb (65.3 kg)   SpO2 100%   BMI 26.34 kg/m  General: Alert, oriented, no acute distress. HEENT: Normocephalic, atraumatic, sclera anicteric. Chest: Unlabored breathing on room air.  Lungs clear to auscultation bilaterally. Cardiovascular: Regular rate and rhythm, no murmurs or rubs appreciated. Abdomen: soft, nontender.  Normoactive bowel sounds.  No masses or hepatosplenomegaly appreciated.  Laxity of the anterior abdominal wall with mild tympany. Extremities: Grossly normal range of motion.  Warm, well perfused.  Trace edema of bilateral lower extremities. Skin: No rashes or lesions noted. Lymphatics: No cervical, supraclavicular, or inguinal adenopathy. GU: Normal appearing external genitalia without erythema, excoriation, or lesions.  Speculum exam reveals moderately atrophic vaginal mucosa, no discharge or bleeding.  Cuff intact without masses.  Bimanual exam reveals minimal thickening at the vaginal cuff, better appreciated on rectovaginal exam.  No nodularity or masses.  Laboratory & Radiologic Studies: None new  Assessment & Plan: Mikayla Harrison is a 89 y.o. 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 MSI-H, MMR abn, MLH1 promoter hypermethylation absent.  Patient declined any adjuvant therapy.  Patient endorses several weeks of multiple symptoms including abdominal/pelvic pain and urinary symptoms.  Although I do not feel any evidence of recurrence on exam, plan to send urinalysis and culture today and schedule the patient for CT scan.  Reviewed again her molecular testing which showed MSI high tumor and absent MLH1 promoter hyper methylation.  Given this, I had previously recommended referral to genetics for genetic testing to rule out hereditary cancer syndrome.  Patient amenable although has some difficulty with  scheduling appointments due to needing to help her daughter, who recently lost her husband.  We will get the patient rescheduled for genetic appointment today.  Per NCCN surveillance recommendations, we will continue with visits every 3 months to include a symptom review and exam.  We discussed signs and symptoms that should prompt a phone call between visits.  24 minutes of total time was spent for this  patient encounter, including preparation, face-to-face counseling with the patient and coordination of care, and documentation of the encounter.  Jeral Pinch, MD  Division of Gynecologic Oncology  Department of Obstetrics and Gynecology  [facility]

## 2022-07-02 ENCOUNTER — Telehealth: Payer: Self-pay | Admitting: *Deleted

## 2022-07-02 NOTE — Telephone Encounter (Signed)
Call from patient regarding lab results from Friday. Advised will check on results and call back- patient states to leave voicemail message.   Specimen was not processed in lab and will need to come in for recollection. Call to patient and left message to call back to schedule lab appointment for recollection.

## 2022-07-02 NOTE — Telephone Encounter (Signed)
Called patient regarding urine sample. Patient states she was out most of the day today and unable to come in to give urine sample but she is able to come tomorrow at 11 to give another urine sample. Patient verbalized understanding and had no other concerns at this time.

## 2022-07-03 ENCOUNTER — Inpatient Hospital Stay: Payer: Medicare HMO

## 2022-07-04 ENCOUNTER — Encounter: Payer: Self-pay | Admitting: Internal Medicine

## 2022-07-05 DIAGNOSIS — H26492 Other secondary cataract, left eye: Secondary | ICD-10-CM | POA: Diagnosis not present

## 2022-07-10 ENCOUNTER — Ambulatory Visit (HOSPITAL_COMMUNITY)
Admission: RE | Admit: 2022-07-10 | Discharge: 2022-07-10 | Disposition: A | Discharge status: Home / Self Care | Payer: Medicare HMO | Source: Ambulatory Visit | Attending: Gynecologic Oncology | Admitting: Gynecologic Oncology

## 2022-07-10 ENCOUNTER — Other Ambulatory Visit: Payer: Self-pay | Admitting: Gynecologic Oncology

## 2022-07-10 ENCOUNTER — Encounter (HOSPITAL_COMMUNITY): Payer: Self-pay | Admitting: Radiology

## 2022-07-10 DIAGNOSIS — K5730 Diverticulosis of large intestine without perforation or abscess without bleeding: Secondary | ICD-10-CM | POA: Diagnosis not present

## 2022-07-10 DIAGNOSIS — C541 Malignant neoplasm of endometrium: Secondary | ICD-10-CM | POA: Diagnosis not present

## 2022-07-10 MED ORDER — IOHEXOL 9 MG/ML PO SOLN
ORAL | Status: AC
  Filled 2022-07-10: qty 1000

## 2022-07-10 MED ORDER — IOHEXOL 9 MG/ML PO SOLN
1000.0000 mL | ORAL | Status: AC
  Administered 2022-07-10: 1000 mL via ORAL

## 2022-07-11 ENCOUNTER — Telehealth: Payer: Self-pay | Admitting: Gynecologic Oncology

## 2022-07-11 NOTE — Telephone Encounter (Signed)
Called patient. Discussed CT findings. Given large stool burden recommended starting bowel regimen. Patient has not come back to leave urine specimen with [place]. She will call her PCP to have it done closer to home and will let us know results.  Jeral Pinch MD Gynecologic Oncology

## 2022-07-12 ENCOUNTER — Telehealth: Payer: Self-pay | Admitting: *Deleted

## 2022-07-12 NOTE — Telephone Encounter (Signed)
Called and spoke with the patient to schedule a follow up appt with Dr Berline Lopes in April. Patient will call the office back to schedule appt

## 2022-07-12 NOTE — Telephone Encounter (Signed)
Per Dr Berline Lopes patient scheduled for a follow up appt with Dr Berline Lopes on 4/11. Per patient requested moved genetics appts from 2/6 to 3/6

## 2022-07-12 NOTE — Telephone Encounter (Signed)
Pt scheduled

## 2022-07-16 ENCOUNTER — Encounter: Payer: Self-pay | Admitting: Internal Medicine

## 2022-07-17 ENCOUNTER — Inpatient Hospital Stay: Payer: Medicare HMO

## 2022-07-17 ENCOUNTER — Inpatient Hospital Stay: Payer: Medicare HMO | Admitting: Genetic Counselor

## 2022-07-31 NOTE — Progress Notes (Unsigned)
Assessment and Plan:  There are no diagnoses linked to this encounter. Mikayla Harrison was seen today for acute visit.  Diagnoses and all orders for this visit:  Hypothyroidism, unspecified type Continue current dose of levothyroxine until lab results evaluated -     TSH  Dysuria Advised to push fluids Advised irritable, mood changes can possibly be related to UTI Start Macrobid BID and will change if culture show not effective -     nitrofurantoin, macrocrystal-monohydrate, (MACROBID) 100 MG capsule; Take 1 capsule (100 mg total) by mouth 2 (two) times daily for 7 days. -     Urinalysis, Routine w reflex microscopic -     Urine Culture      Further disposition pending results of labs. Discussed med's effects and SE's.   Over 30 minutes of exam, counseling, chart review, and critical decision making was performed.   Future Appointments  Date Time Provider [place]  08/15/2022  1:00 PM Clarene Essex, Counselor [facility] None  08/15/2022  2:15 PM [facility] None  09/19/2022  2:30 PM [name], MD [facility] None  09/20/2022  1:30 PM Lafonda Mosses, MD [facility] None  03/06/2023  3:00 PM [name], MD [facility] None  06/13/2023 11:00 AM Alycia Rossetti, NP [facility] None    ------------------------------------------------------------------------------------------------------------------   HPI BP 118/62   Pulse 77   Temp 97.7 F (36.5 C)   Ht 5' 2"$  (1.575 m)   Wt 143 lb 9.6 oz (65.1 kg)   SpO2 97%   BMI 26.26 kg/m    89 y.o.female presents for burning and frequency of urination x 1 month.  She had a home test it was positive. Denies back pain and abdominal pain , nausea, vomiting and fever  She does feel a lot more irritable and feels cold all the time  Past Medical History:  Diagnosis Date   Anemia    Anxiety    Arthritis    CKD (chronic kidney disease) stage 3, GFR 30-59 ml/min (HCC)    Colon polyp    Diverticulosis     Endometrial cancer (HCC)    Hypertension    Hypothyroidism    Iron deficiency    Peripheral neuropathy    in bilateral legs   Plantar fasciitis    Pre-diabetes    Prediabetes    Pt denies   Small bowel obstruction (HCC)      Allergies  Allergen Reactions   Sulfa Antibiotics Other (See Comments)   Codeine Nausea Only   Prednisone Nausea And Vomiting, Nausea Only and Other (See Comments)   Asa [Aspirin] Anxiety and Other (See Comments)    Take 23m cannot take 3233mgets nervous    Current Outpatient Medications on File Prior to Visit  Medication Sig   amLODipine (NORVASC) 10 MG tablet TAKE 1/2 TO 1 (ONE-HALF TO ONE) TABLET BY MOUTH ONCE DAILY FOR BLOOD PRESSURE   Ascorbic Acid (VITAMIN C PO) Take 1,000 mg by mouth daily.   benazepril (LOTENSIN) 40 MG tablet Take 1 tablet by mouth once daily for blood pressure   Cholecalciferol (VITAMIN D3) 125 MCG (5000 UT) TABS Take 5,000 Units by mouth daily.   clorazepate (TRANXENE) 7.5 MG tablet Take 1/2 - 1 tablet 2 - 3 x /day ONLY if needed for Anxiety Attack &  limit to 5 days /week to avoid Addiction & Dementia (Patient taking differently: Take 1.875 mg by mouth daily as needed for anxiety. Take 1/2 - 1 tablet 2 - 3 x /day ONLY  if needed for Anxiety Attack &  limit to 5 days /week to avoid Addiction & Dementia)   clotrimazole (LOTRIMIN) 1 % cream Apply 1 application topically 2 (two) times daily.   Cyanocobalamin (VITAMIN B-12) 2500 MCG SUBL Take 2,500 mcg by mouth daily.   Magnesium 250 MG TABS Take 1 tablet by mouth daily.   meclizine (ANTIVERT) 25 MG tablet Take 1/2 to 1  3 x  /day as needed for Dizziness / Vertigo   SYNTHROID 150 MCG tablet TAKE 1 TABLET DAILY ON AN EMPTY STOMACH WITH ONLY WATER FOR 30 MINUTES & NO ANTIACID MEDS, CALCIUM OR MAGNESIUM FOR 4 HOURS & AVOID BIOTIN   zinc gluconate 50 MG tablet Take 50 mg by mouth daily.   ciprofloxacin (CIPRO) 500 MG tablet Take  1 tablet  2 x /day  with Food  for UTI (Patient not taking:  Reported on 08/01/2022)   No current facility-administered medications on file prior to visit.    ROS: all negative except above.   Physical Exam:  BP 118/62   Pulse 77   Temp 97.7 F (36.5 C)   Ht 5' 2"$  (1.575 m)   Wt 143 lb 9.6 oz (65.1 kg)   SpO2 97%   BMI 26.26 kg/m   General Appearance: Well nourished, in no apparent distress. Eyes: PERRLA, EOMs, conjunctiva no swelling or erythema Neck: Supple, thyroid normal.  Respiratory: Respiratory effort normal, BS equal bilaterally without rales, rhonchi, wheezing or stridor.  Cardio: RRR with no MRGs. Brisk peripheral pulses without edema.  Abdomen: Soft, + BS.  Non tender, no guarding, rebound, hernias, masses. Lymphatics: Non tender without lymphadenopathy.  Musculoskeletal: Full ROM, 5/5 strength, normal gait. Negative CVA tenderness Skin: Warm, dry without rashes, lesions, ecchymosis.  Neuro: Cranial nerves intact. Normal muscle tone, no cerebellar symptoms. Sensation intact.  Psych: Awake and oriented X 3, normal affect, Insight and Judgment appropriate.     Alycia Rossetti, NP 2:27 PM [facility]

## 2022-08-01 ENCOUNTER — Encounter: Payer: Self-pay | Admitting: Nurse Practitioner

## 2022-08-01 ENCOUNTER — Ambulatory Visit (INDEPENDENT_AMBULATORY_CARE_PROVIDER_SITE_OTHER): Payer: Medicare HMO | Admitting: Nurse Practitioner

## 2022-08-01 VITALS — BP 118/62 | HR 77 | Temp 97.7°F | Ht 62.0 in | Wt 143.6 lb

## 2022-08-01 DIAGNOSIS — E039 Hypothyroidism, unspecified: Secondary | ICD-10-CM

## 2022-08-01 DIAGNOSIS — R300 Dysuria: Secondary | ICD-10-CM

## 2022-08-01 MED ORDER — NITROFURANTOIN MONOHYD MACRO 100 MG PO CAPS: 100.0000 mg | ORAL_CAPSULE | Freq: Two times a day (BID) | ORAL | 0 refills | Status: AC

## 2022-08-02 LAB — URINE CULTURE
MICRO NUMBER:: 14596263
SPECIMEN QUALITY:: ADEQUATE

## 2022-08-02 LAB — URINALYSIS, ROUTINE W REFLEX MICROSCOPIC
Bilirubin Urine: NEGATIVE
Glucose, UA: NEGATIVE
Hgb urine dipstick: NEGATIVE
Hyaline Cast: NONE SEEN /LPF
Ketones, ur: NEGATIVE
Nitrite: NEGATIVE
Protein, ur: NEGATIVE
Specific Gravity, Urine: 1.017 (ref 1.001–1.035)
pH: 5.5 (ref 5.0–8.0)

## 2022-08-02 LAB — TSH: TSH: 1.18 mIU/L (ref 0.40–4.50)

## 2022-08-02 LAB — MICROSCOPIC MESSAGE

## 2022-08-06 ENCOUNTER — Telehealth: Payer: Self-pay | Admitting: Internal Medicine

## 2022-08-06 NOTE — Telephone Encounter (Signed)
Per 2/26 IB reached out to patient to answer questions, left voicemail.

## 2022-08-15 ENCOUNTER — Inpatient Hospital Stay: Payer: Medicare HMO | Admitting: Genetic Counselor

## 2022-08-15 ENCOUNTER — Inpatient Hospital Stay: Payer: Medicare HMO

## 2022-08-20 ENCOUNTER — Other Ambulatory Visit: Payer: Self-pay | Admitting: Nurse Practitioner

## 2022-08-20 DIAGNOSIS — I10 Essential (primary) hypertension: Secondary | ICD-10-CM

## 2022-08-26 ENCOUNTER — Other Ambulatory Visit: Payer: Self-pay | Admitting: Internal Medicine

## 2022-08-26 DIAGNOSIS — E039 Hypothyroidism, unspecified: Secondary | ICD-10-CM

## 2022-08-26 MED ORDER — SYNTHROID 150 MCG PO TABS: ORAL_TABLET | ORAL | 3 refills | Status: DC

## 2022-08-26 MED ORDER — SYNTHROID 150 MCG PO TABS: ORAL_TABLET | ORAL | 0 refills | Status: DC

## 2022-08-28 NOTE — Progress Notes (Unsigned)
Assessment and Plan:  There are no diagnoses linked to this encounter.    Further disposition pending results of labs. Discussed med's effects and SE's.   Over 30 minutes of exam, counseling, chart review, and critical decision making was performed.   Future Appointments  Date Time Provider [place]  08/29/2022 10:30 AM Alycia Rossetti, NP [facility] None  09/19/2022  2:30 PM [name], MD [facility] None  09/20/2022  1:30 PM Lafonda Mosses, MD [facility] None  10/01/2022  1:00 PM Loralyn Freshwater M [facility] None  10/01/2022  2:00 PM [facility] LAB [facility] None  03/06/2023  3:00 PM [name], MD [facility] None  06/13/2023 11:00 AM Alycia Rossetti, NP [facility] None    ------------------------------------------------------------------------------------------------------------------   HPI There were no vitals taken for this visit. 89 y.o.female presents for  Past Medical History:  Diagnosis Date   Anemia    Anxiety    Arthritis    CKD (chronic kidney disease) stage 3, GFR 30-59 ml/min (HCC)    Colon polyp    Diverticulosis    Endometrial cancer (HCC)    Hypertension    Hypothyroidism    Iron deficiency    Peripheral neuropathy    in bilateral legs   Plantar fasciitis    Pre-diabetes    Prediabetes    Pt denies   Small bowel obstruction (HCC)      Allergies  Allergen Reactions   Sulfa Antibiotics Other (See Comments)   Codeine Nausea Only   Prednisone Nausea And Vomiting, Nausea Only and Other (See Comments)   Asa [Aspirin] Anxiety and Other (See Comments)    Take 81mg  cannot take 325mg  gets nervous    Current Outpatient Medications on File Prior to Visit  Medication Sig   amLODipine (NORVASC) 10 MG tablet TAKE 1/2 TO 1 (ONE-HALF TO ONE) TABLET BY MOUTH ONCE DAILY FOR BLOOD PRESSURE   Ascorbic Acid (VITAMIN C PO) Take 1,000 mg by mouth daily.   benazepril (LOTENSIN) 40 MG tablet Take 1 tablet by mouth once daily for blood  pressure   Cholecalciferol (VITAMIN D3) 125 MCG (5000 UT) TABS Take 5,000 Units by mouth daily.   clorazepate (TRANXENE) 7.5 MG tablet Take 1/2 - 1 tablet 2 - 3 x /day ONLY if needed for Anxiety Attack &  limit to 5 days /week to avoid Addiction & Dementia (Patient taking differently: Take 1.875 mg by mouth daily as needed for anxiety. Take 1/2 - 1 tablet 2 - 3 x /day ONLY if needed for Anxiety Attack &  limit to 5 days /week to avoid Addiction & Dementia)   clotrimazole (LOTRIMIN) 1 % cream Apply 1 application topically 2 (two) times daily.   Cyanocobalamin (VITAMIN B-12) 2500 MCG SUBL Take 2,500 mcg by mouth daily.   Magnesium 250 MG TABS Take 1 tablet by mouth daily.   meclizine (ANTIVERT) 25 MG tablet Take 1/2 to 1  3 x  /day as needed for Dizziness / Vertigo   SYNTHROID 150 MCG tablet Take  1 tablet  Daily  on an empty stomach with only water for 30 minutes & no Antacid meds, Calcium or Magnesium for 4 hours & avoid Biotin   zinc gluconate 50 MG tablet Take 50 mg by mouth daily.   No current facility-administered medications on file prior to visit.    ROS: all negative except above.   Physical Exam:  There were no vitals taken for this visit.  General Appearance: Well nourished, in no apparent distress. Eyes: PERRLA,  EOMs, conjunctiva no swelling or erythema Sinuses: No Frontal/maxillary tenderness ENT/Mouth: Ext aud canals clear, TMs without erythema, bulging. No erythema, swelling, or exudate on post pharynx.  Tonsils not swollen or erythematous. Hearing normal.  Neck: Supple, thyroid normal.  Respiratory: Respiratory effort normal, BS equal bilaterally without rales, rhonchi, wheezing or stridor.  Cardio: RRR with no MRGs. Brisk peripheral pulses without edema.  Abdomen: Soft, + BS.  Non tender, no guarding, rebound, hernias, masses. Lymphatics: Non tender without lymphadenopathy.  Musculoskeletal: Full ROM, 5/5 strength, normal gait.  Skin: Warm, dry without rashes, lesions,  ecchymosis.  Neuro: Cranial nerves intact. Normal muscle tone, no cerebellar symptoms. Sensation intact.  Psych: Awake and oriented X 3, normal affect, Insight and Judgment appropriate.     Alycia Rossetti, NP 3:53 PM [facility]

## 2022-08-29 ENCOUNTER — Encounter: Payer: Self-pay | Admitting: Nurse Practitioner

## 2022-08-29 ENCOUNTER — Ambulatory Visit (HOSPITAL_COMMUNITY)
Admission: RE | Admit: 2022-08-29 | Discharge: 2022-08-29 | Disposition: A | Discharge status: Home / Self Care | Payer: Medicare HMO | Source: Ambulatory Visit | Attending: Nurse Practitioner | Admitting: Nurse Practitioner

## 2022-08-29 ENCOUNTER — Ambulatory Visit (INDEPENDENT_AMBULATORY_CARE_PROVIDER_SITE_OTHER): Payer: Medicare HMO | Admitting: Nurse Practitioner

## 2022-08-29 VITALS — BP 112/60 | HR 69 | Temp 97.7°F | Ht 62.0 in | Wt 146.2 lb

## 2022-08-29 DIAGNOSIS — I10 Essential (primary) hypertension: Secondary | ICD-10-CM

## 2022-08-29 DIAGNOSIS — H9193 Unspecified hearing loss, bilateral: Secondary | ICD-10-CM | POA: Diagnosis not present

## 2022-08-29 DIAGNOSIS — M7989 Other specified soft tissue disorders: Secondary | ICD-10-CM

## 2022-08-29 DIAGNOSIS — M79605 Pain in left leg: Secondary | ICD-10-CM | POA: Diagnosis not present

## 2022-08-29 NOTE — Progress Notes (Signed)
Left lower extremity venous study completed.   Preliminary results relayed to [facility].  Please see CV Procedures for preliminary results.  Lynnetta Tom, RVT  2:10 PM 08/29/22

## 2022-08-29 NOTE — Progress Notes (Signed)
Please advise there is no DVT. Encourage to wear compression socks and keep legs elevated above heart as much as possible.

## 2022-08-29 NOTE — Patient Instructions (Signed)
Hearing Loss Hearing loss is a partial or total loss of the ability to hear. This can be temporary or permanent, and it can happen in one or both ears. There are two types of hearing loss. You can have just one type or both types. You may have a problem with: Damage to your hearing nerves (sensorineural hearing loss). This type of hearing loss is more likely to be permanent. A hearing aid is often the best treatment. Sound getting to your inner ear (conductive hearing loss). This type of hearing loss can usually be treated medically or surgically. Medical care is necessary to treat hearing loss properly and to prevent the condition from getting worse. Your hearing may partially or completely come back, depending on what caused your hearing loss and how severe it is. In some cases, hearing loss is permanent. What are the causes? Common causes of hearing loss include: Too much wax in the ear canal. Infection of the ear canal or middle ear. Fluid in the middle ear. Injury to the ear or surrounding area. An object stuck in the ear. A history of prolonged exposure to loud sounds, such as music. Less common causes of hearing loss include: Tumors in the ear. Viral or bacterial infections, such as meningitis. A hole in the eardrum (perforated eardrum). Problems with the hearing nerve that sends signals between the brain and the ear. Certain medicines. What are the signs or symptoms? Symptoms of this condition may include: Difficulty telling the difference between sounds. Difficulty following a conversation when there is background noise. Lack of response to sounds in your environment. This may be most noticeable when you do not respond to startling sounds. Needing to turn up the volume on the television, radio, or other devices. Ringing in the ears. Dizziness. How is this diagnosed? This condition is diagnosed based on: A physical exam. A hearing test (audiometry). The test will be performed  by a hearing specialist (audiologist). Imaging tests, such as an MRI or CT scan. You may also be referred to an ear, nose, and throat (ENT) specialist (otolaryngologist). How is this treated? Treatment for hearing loss may include: Earwax removal. Medicines to treat or prevent infection (antibiotics). Medicines to reduce inflammation (corticosteroids). Hearing aids or assistive listening devices. Follow these instructions at home: If you were prescribed an antibiotic medicine, take it as told by your health care provider. Do not stop taking the antibiotic even if you start to feel better. Take over-the-counter and prescription medicines only as told by your health care provider. Avoid loud noises. Use hearing protection if you are exposed to loud noises or work in a noisy environment. Return to your normal activities as told by your health care provider. Ask your health care provider what activities are safe for you. Keep all follow-up visits. This is important. Contact a health care provider if: You feel dizzy. You develop new symptoms. You vomit or feel nauseous. You have a fever. Get help right away if: You develop sudden changes in your vision. You have severe ear pain. You have new or increased weakness. You have a severe headache. Summary Hearing loss is a decreased ability to hear sounds around you. It can be temporary or permanent. Treatment will depend on the cause of your hearing loss. It may include earwax removal, medicines, or a hearing aid. Your hearing may partially or completely come back, depending on what caused your hearing loss and how severe it is. Keep all follow-up visits. This is important. This information is   not intended to replace advice given to you by your health care provider. Make sure you discuss any questions you have with your health care provider. Document Revised: 04/06/2021 Document Reviewed: 04/06/2021 Elsevier Patient Education  2023 Elsevier  Inc.  

## 2022-09-11 ENCOUNTER — Encounter: Payer: Medicare HMO | Admitting: Genetic Counselor

## 2022-09-18 ENCOUNTER — Encounter: Payer: Self-pay | Admitting: Internal Medicine

## 2022-09-18 ENCOUNTER — Ambulatory Visit (INDEPENDENT_AMBULATORY_CARE_PROVIDER_SITE_OTHER): Payer: Medicare HMO | Admitting: Internal Medicine

## 2022-09-18 VITALS — BP 118/60 | HR 68 | Temp 97.9°F | Resp 16 | Ht 62.0 in | Wt 147.4 lb

## 2022-09-18 DIAGNOSIS — E559 Vitamin D deficiency, unspecified: Secondary | ICD-10-CM

## 2022-09-18 DIAGNOSIS — Z79899 Other long term (current) drug therapy: Secondary | ICD-10-CM

## 2022-09-18 DIAGNOSIS — I10 Essential (primary) hypertension: Secondary | ICD-10-CM | POA: Diagnosis not present

## 2022-09-18 DIAGNOSIS — I872 Venous insufficiency (chronic) (peripheral): Secondary | ICD-10-CM | POA: Diagnosis not present

## 2022-09-18 DIAGNOSIS — E782 Mixed hyperlipidemia: Secondary | ICD-10-CM | POA: Diagnosis not present

## 2022-09-18 DIAGNOSIS — N1831 Chronic kidney disease, stage 3a: Secondary | ICD-10-CM

## 2022-09-18 DIAGNOSIS — E039 Hypothyroidism, unspecified: Secondary | ICD-10-CM

## 2022-09-18 DIAGNOSIS — R7309 Other abnormal glucose: Secondary | ICD-10-CM | POA: Diagnosis not present

## 2022-09-18 DIAGNOSIS — N1832 Chronic kidney disease, stage 3b: Secondary | ICD-10-CM | POA: Diagnosis not present

## 2022-09-18 MED ORDER — TORSEMIDE 20 MG PO TABS: ORAL_TABLET | ORAL | 3 refills | Status: DC

## 2022-09-18 NOTE — Patient Instructions (Signed)

## 2022-09-18 NOTE — Progress Notes (Signed)
Future Appointments  Date Time Provider Department  09/18/2022                   6 mo ov 11:30 AM Lucky Cowboy, MD [facility]  09/20/2022  1:30 PM Carver Fila, MD [facility]  10/01/2022  1:00 PM Christiane Ha [facility]  03/06/2023                   cpe  3:00 PM Lucky Cowboy, MD [facility]  06/13/2023 11:00 AM Raynelle Dick, NP [facility]    History of Present Illness:       This very nice 89 y.o. WWF presents for 6 month follow up with HTN, HLD, Hypothyroidism,  Pre-Diabetes and Vitamin D Deficiency.  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        Patient is treated for HTN & BP has been controlled at home. Today's BP is at goal -  118/60.  Patient has CKD3a  (GFR 49) .  Patient has had no complaints of any cardiac type chest pain, palpitations, dyspnea / orthopnea / PND, dizziness, claudication,  but does c/o dependent edema.       Hyperlipidemia is controlled with diet & meds. Patient denies myalgias or other med SE's. Last Lipids were at goal :  Lab Results  Component Value Date   CHOL 138 03/01/2022   HDL 52 03/01/2022   LDLCALC 69 03/01/2022   TRIG 91 03/01/2022   CHOLHDL 2.7 03/01/2022     Also, the patient has history of PreDiabetes and has had no symptoms of reactive hypoglycemia, diabetic polys, paresthesias or visual blurring.  Last A1c was not at goal :  Lab Results  Component Value Date   HGBA1C 5.9 (H) 03/01/2022            Further, the patient also has history of Vitamin D Deficiency and supplements vitamin D without any suspected side-effects. Last vitamin D was at goal :  Lab Results  Component Value Date   VD25OH 101 (H) 03/01/2022       Current Outpatient Medications on File Prior to Visit  Medication Sig   acetaminophen 325 MG tablet Take every 6  hours as needed.   amLODipine 10 MG tablet TAKE 1/2 TO 1 TABLET  DAILY    VITAMIN C  Take 1 tablet daily.    aspirin 81 MG tablet Take  daily.   atenolol  50 MG tablet Take 1 tablet  daily f    benazepril 40 MG tablet Take 1 tablet  Daily    VITAMIN D 1000 units  Take 1,000 Units by mouth 2 (two) times daily.   clorazepate  7.5 MG tablet TAKE 1/2-1 TAB 2-3 x/ DAILY AS NEEDED    Magnesium 500 MG TABS Take 2 tablets  daily.   meclizine  25 MG tablet TAKE 1/2 -1 TABLET THREE TIMES DAILY    SYNTHROID 150 MCG tablet Take 1 tablet daily   vitamin B-12  500 MCG tablet Take  daily.   zinc  50 MG tablet Takes 1/2 tablet daily.     Allergies  Allergen Reactions   Sulfa Antibiotics    Asa [Aspirin]     Take 81mg  cannot take 325mg  gets nervous   Codeine Nausea Only   Prednisone Nausea And Vomiting    PMHx:   Past Medical History:  Diagnosis Date   CKD (  chronic kidney disease) stage 3, GFR 30-59 ml/min (HCC)    Colon polyp    Diverticulosis    Hypertension    Iron deficiency    Prediabetes    Prediabetes    Small bowel obstruction (HCC)     Immunization History  Administered Date(s) Administered   DT (Pediatric) 03/22/2015   Influenza Split 03/04/2013   Influenza, High Dose Seasonal PF 03/16/2014, 03/22/2015, 02/15/2016, 04/23/2017, 04/30/2018, 04/08/2019   Pneumococcal Polysaccharide-23 03/16/2014   Td 10/10/2017    Past Surgical History:  Procedure Laterality Date   CATARACT EXTRACTION     EYE SURGERY     MACULAR TEAR     Vericose vein  1960    FHx:    Reviewed / unchanged  SHx:    Reviewed / unchanged   Systems Review:  Constitutional: Denies fever, chills, wt changes, headaches, insomnia, fatigue, night sweats, change in appetite. Eyes: Denies redness, blurred vision, diplopia, discharge, itchy, watery eyes.  ENT: Denies discharge, congestion, post nasal drip, epistaxis, sore throat, earache, hearing loss, dental pain, tinnitus, vertigo, sinus pain, snoring.  CV: Denies chest pain, palpitations, irregular heartbeat, syncope, dyspnea, diaphoresis, orthopnea, PND,  claudication or edema. Respiratory: denies cough, dyspnea, DOE, pleurisy, hoarseness, laryngitis, wheezing.  Gastrointestinal: Denies dysphagia, odynophagia, heartburn, reflux, water brash, abdominal pain or cramps, nausea, vomiting, bloating, diarrhea, constipation, hematemesis, melena, hematochezia  or hemorrhoids. Genitourinary: Denies dysuria, frequency, urgency, nocturia, hesitancy, discharge, hematuria or flank pain. Musculoskeletal: Denies arthralgias, myalgias, stiffness, jt. swelling, pain, limping or strain/sprain.  Skin: Denies pruritus, rash, hives, warts, acne, eczema or change in skin lesion(s). Neuro: No weakness, tremor, incoordination, spasms, paresthesia or pain. Psychiatric: Denies confusion, memory loss or sensory loss. Endo: Denies change in weight, skin or hair change.  Heme/Lymph: No excessive bleeding, bruising or enlarged lymph nodes.  Physical Exam  BP 118/60   Pulse 68   Temp 97.9 F (36.6 C)   Resp 16   Ht 5\' 2"  (1.575 m)   Wt 147 lb 6.4 oz (66.9 kg)   SpO2 99%   BMI 26.96 kg/m   Appears  well nourished, well groomed  and in no distress.  Eyes: PERRLA, EOMs, conjunctiva no swelling or erythema. Sinuses: No frontal/maxillary tenderness ENT/Mouth: EAC's clear, TM's nl w/o erythema, bulging. Nares clear w/o erythema, swelling, exudates. Oropharynx clear without erythema or exudates. Oral hygiene is good. Tongue normal, non obstructing. Hearing intact.  Neck: Supple. Thyroid not palpable. Car 2+/2+ without bruits, nodes or JVD. Chest: Respirations nl with BS clear & equal w/o rales, rhonchi, wheezing or stridor.  Cor: Heart sounds normal w/ regular rate and rhythm without sig. murmurs, gallops, clicks or rubs. Peripheral pulses normal and equal  with 1-2 + distal calf/pretibial & ankle  edema.  Moderate bilat varicosities.  Abdomen: Soft & bowel sounds normal. Non-tender w/o guarding, rebound, hernias, masses or organomegaly.  Lymphatics: Unremarkable.   Musculoskeletal: Full ROM all peripheral extremities, joint stability, 5/5 strength and normal gait.  Skin: Warm, dry without exposed rashes, lesions or ecchymosis apparent.  Neuro: Cranial nerves intact, reflexes equal bilaterally. Sensory-motor testing grossly intact. Tendon reflexes grossly intact.  Pysch: Alert & oriented x 3.  Insight and judgement nl & appropriate. No ideations.  Assessment and Plan:   1. Essential hypertension  -  D/C Amlodipine & Add Demadex 20 mg .  -  monitor blood pressure at home.  - Continue DASH diet.  Reminder to go to the ER if any CP,  SOB, nausea, dizziness, severe HA, changes  vision/speech.   - CBC with Differential/Platelet - COMPLETE METABOLIC PANEL WITH GFR - Magnesium  2. Hyperlipidemia, mixed  - Continue diet/meds, exercise,& lifestyle modifications.  - Continue monitor periodic cholesterol/liver & renal functions    - Lipid panel  3. Abnormal glucose  - Continue diet, exercise  - Lifestyle modifications.  - Monitor appropriate labs    - Hemoglobin A1c - Insulin, random  4. Vitamin D deficiency  - Continue supplementation.   - VITAMIN D 25 Hydroxy  5. Hypothyroidism  - TSH  6. Stage 3a chronic kidney disease  - COMPLETE METABOLIC PANEL WITH GFR  7. Medication management  - CBC with Differential/Platelet - COMPLETE METABOLIC PANEL WITH GFR - Magnesium - Lipid panel - TSH - Hemoglobin A1c - Insulin, random - VITAMIN D 25 Hydroxy  - PTH, intact and calcium        Discussed  regular exercise, BP monitoring, weight control to achieve/maintain BMI less than 25 and discussed med and SE's. Recommended labs to assess and monitor clinical status with further disposition pending results of labs.  I discussed the assessment and treatment plan with the patient. The patient was provided an opportunity to ask questions and all were answered. The patient agreed with the plan and demonstrated an understanding of the instructions.   I provided over 30 minutes of exam, counseling, chart review and  complex critical decision making.       The patient was advised to call back or seek an in-person evaluation if the symptoms worsen or if the condition fails to improve as anticipated.   Marinus Maw, MD

## 2022-09-19 ENCOUNTER — Ambulatory Visit: Payer: Medicare HMO | Admitting: Internal Medicine

## 2022-09-19 ENCOUNTER — Telehealth: Payer: Self-pay

## 2022-09-19 LAB — COMPLETE METABOLIC PANEL WITH GFR
AG Ratio: 1.4 (calc) (ref 1.0–2.5)
ALT: 12 U/L (ref 6–29)
AST: 22 U/L (ref 10–35)
Albumin: 3.8 g/dL (ref 3.6–5.1)
Alkaline phosphatase (APISO): 78 U/L (ref 37–153)
BUN: 21 mg/dL (ref 7–25)
CO2: 24 mmol/L (ref 20–32)
Calcium: 8.9 mg/dL (ref 8.6–10.4)
Chloride: 105 mmol/L (ref 98–110)
Creat: 0.95 mg/dL (ref 0.60–0.95)
Globulin: 2.7 g/dL (calc) (ref 1.9–3.7)
Glucose, Bld: 91 mg/dL (ref 65–99)
Potassium: 4.1 mmol/L (ref 3.5–5.3)
Sodium: 139 mmol/L (ref 135–146)
Total Bilirubin: 0.3 mg/dL (ref 0.2–1.2)
Total Protein: 6.5 g/dL (ref 6.1–8.1)
eGFR: 58 mL/min/{1.73_m2} — ABNORMAL LOW (ref >=60)

## 2022-09-19 LAB — CBC WITH DIFFERENTIAL/PLATELET
Absolute Monocytes: 518 cells/uL (ref 200–950)
Basophils Absolute: 37 cells/uL (ref 0–200)
Basophils Relative: 0.5 %
Eosinophils Absolute: 80 cells/uL (ref 15–500)
Eosinophils Relative: 1.1 %
HCT: 31 % — ABNORMAL LOW (ref 35.0–45.0)
Hemoglobin: 10.2 g/dL — ABNORMAL LOW (ref 11.7–15.5)
Lymphs Abs: 1314 cells/uL (ref 850–3900)
MCH: 28.2 pg (ref 27.0–33.0)
MCHC: 32.9 g/dL (ref 32.0–36.0)
MCV: 85.6 fL (ref 80.0–100.0)
MPV: 11.7 fL (ref 7.5–12.5)
Monocytes Relative: 7.1 %
Neutro Abs: 5351 cells/uL (ref 1500–7800)
Neutrophils Relative %: 73.3 %
Platelets: 246 10*3/uL (ref 140–400)
RBC: 3.62 10*6/uL — ABNORMAL LOW (ref 3.80–5.10)
RDW: 13.3 % (ref 11.0–15.0)
Total Lymphocyte: 18 %
WBC: 7.3 10*3/uL (ref 3.8–10.8)

## 2022-09-19 LAB — HEMOGLOBIN A1C
Hgb A1c MFr Bld: 6.1 % of total Hgb — ABNORMAL HIGH (ref <5.7)
Mean Plasma Glucose: 128 mg/dL
eAG (mmol/L): 7.1 mmol/L

## 2022-09-19 LAB — LIPID PANEL
Cholesterol: 133 mg/dL (ref <200)
HDL: 46 mg/dL — ABNORMAL LOW (ref >=50)
LDL Cholesterol (Calc): 69 mg/dL (calc)
Non-HDL Cholesterol (Calc): 87 mg/dL (calc) (ref <130)
Total CHOL/HDL Ratio: 2.9 (calc) (ref <5.0)
Triglycerides: 99 mg/dL (ref <150)

## 2022-09-19 LAB — VITAMIN D 25 HYDROXY (VIT D DEFICIENCY, FRACTURES): Vit D, 25-Hydroxy: 103 ng/mL — ABNORMAL HIGH (ref 30–100)

## 2022-09-19 LAB — MAGNESIUM: Magnesium: 1.9 mg/dL (ref 1.5–2.5)

## 2022-09-19 LAB — PTH, INTACT AND CALCIUM
Calcium: 8.9 mg/dL (ref 8.6–10.4)
PTH: 38 pg/mL (ref 16–77)

## 2022-09-19 LAB — INSULIN, RANDOM: Insulin: 22.5 u[IU]/mL — ABNORMAL HIGH

## 2022-09-19 NOTE — Telephone Encounter (Signed)
Pt called to reschedule her appointment with Dr. Pricilla Holm on 4/11. Rescheduled to 5/9 @ 4:00. Pt agreed to date and time

## 2022-09-19 NOTE — Progress Notes (Signed)
^<^<^<^<^<^<^<^<^<^<^<^<^<^<^<^<^<^<^<^<^<^<^<^<^<^<^<^<^<^<^<^<^<^<^<^<^ ^>^>^>^>^>^>^>^>^>^>^>^>^>^>^>^>^>^>^>^>^>^>^>^>^>^>^>^>^>^>^>^>^>^>^>^>^  -   Mild Chronic Anemia is stable  ^<^<^<^<^<^<^<^<^<^<^<^<^<^<^<^<^<^<^<^<^<^<^<^<^<^<^<^<^<^<^<^<^<^<^<^<^ ^>^>^>^>^>^>^>^>^>^>^>^>^>^>^>^>^>^>^>^>^>^>^>^>^>^>^>^>^>^>^>^>^>^>^>^>^  - Chol = 133  Excellent   - Very low risk for Heart Attack  / Stroke ^<^<^<^<^<^<^<^<^<^<^<^<^<^<^<^<^<^<^<^<^<^<^<^<^<^<^<^<^<^<^<^<^<^<^<^<^ ^>^>^>^>^>^>^>^>^>^>^>^>^>^>^>^>^>^>^>^>^>^>^>^>^>^>^>^>^>^>^>^>^>^>^>^>^  -  A1c = 6.1% -       A1c is  STILL  elevated in the borderline and                                                               early or pre-diabetes range which has the same   300% increased risk for heart attack, stroke, cancer and                                                 alzheimer- type vascular dementia as full blown diabetes.   But the good news is that diet, exercise with weight loss can                                                                                    cure the early diabetes at this point. ^<^<^<^<^<^<^<^<^<^<^<^<^<^<^<^<^<^<^<^<^<^<^<^<^<^<^<^<^<^<^<^<^<^<^<^<^ ^>^>^>^>^>^>^>^>^>^>^>^>^>^>^>^>^>^>^>^>^>^>^>^>^>^>^>^>^>^>^>^>^>^>^>^>^  -So   - Avoid Sweets, Candy & White Stuff   - White Rice, White [place], White Flour  - Breads &  Pasta ^<^<^<^<^<^<^<^<^<^<^<^<^<^<^<^<^<^<^<^<^<^<^<^<^<^<^<^<^<^<^<^<^<^<^<^<^ ^>^>^>^>^>^>^>^>^>^>^>^>^>^>^>^>^>^>^>^>^>^>^>^>^>^>^>^>^>^>^>^>^>^>^>^>^  -  Vitamin D = 103  - Excellent - Please keep dose same   ^<^<^<^<^<^<^<^<^<^<^<^<^<^<^<^<^<^<^<^<^<^<^<^<^<^<^<^<^<^<^<^<^<^<^<^<^ ^>^>^>^>^>^>^>^>^>^>^>^>^>^>^>^>^>^>^>^>^>^>^>^>^>^>^>^>^>^>^>^>^>^>^>^>^  - All Else - CBC - Kidneys - Electrolytes - Liver - Magnesium & Thyroid    - all  Normal /  OK ^<^<^<^<^<^<^<^<^<^<^<^<^<^<^<^<^<^<^<^<^<^<^<^<^<^<^<^<^<^<^<^<^<^<^<^<^ ^>^>^>^>^>^>^>^>^>^>^>^>^>^>^>^>^>^>^>^>^>^>^>^>^>^>^>^>^>^>^>^>^>^>^>^>^

## 2022-09-20 ENCOUNTER — Inpatient Hospital Stay: Payer: Medicare HMO | Admitting: Gynecologic Oncology

## 2022-09-20 ENCOUNTER — Ambulatory Visit: Payer: Medicare HMO | Admitting: Internal Medicine

## 2022-09-20 DIAGNOSIS — C541 Malignant neoplasm of endometrium: Secondary | ICD-10-CM

## 2022-09-25 ENCOUNTER — Other Ambulatory Visit: Payer: Self-pay | Admitting: Internal Medicine

## 2022-09-25 DIAGNOSIS — Z201 Contact with and (suspected) exposure to tuberculosis: Secondary | ICD-10-CM

## 2022-09-26 ENCOUNTER — Telehealth: Payer: Self-pay | Admitting: Internal Medicine

## 2022-09-26 NOTE — Telephone Encounter (Signed)
FOR DOCUMENTATION ONLY:  Pt called yesterday 04/16, stating that her sister was informed that she was positive for tb. And bc she was in such close proximity to her sister Mikayla Harrison was wanting to know if she should get tested as well. A paper note was sent back to Dr. Oneta Rack and a response was given to call her and schedule a NV for a tb skin test and tb gold lab. I called the pt and she said that now her sister isn't really sure and her doctor is sending her lab results to the health department for further testing. Mikayla Harrison said she wanted to hold off on getting any test done until she can confirm what her sister has.

## 2022-09-28 ENCOUNTER — Other Ambulatory Visit: Payer: Self-pay | Admitting: Genetic Counselor

## 2022-09-28 DIAGNOSIS — C541 Malignant neoplasm of endometrium: Secondary | ICD-10-CM

## 2022-09-28 NOTE — Progress Notes (Deleted)
REFERRING PROVIDER: Carver Fila, MD [address] [place],  [place] [address]  PRIMARY PROVIDER:  Lucky Cowboy, MD  PRIMARY REASON FOR VISIT:  ***  HISTORY OF PRESENT ILLNESS:   Mikayla Harrison, a 89 y.o. female, was seen for a [facility] cancer genetics consultation at the request of Dr. Pricilla Holm due to a personal history of endometrial cancer and abnormal MMR.  Mikayla Harrison presents to clinic today to discuss the possibility of a hereditary predisposition to cancer, to discuss genetic testing, and to further clarify her future cancer risks, as well as potential cancer risks for family members.   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   CANCER HISTORY:  Oncology History Overview Note  MMR IHC - loss of MLH1 and PMS2 MSI-H MLH1 promoter hypermethylation ABSENT   Endometrial cancer  08/21/2021 Initial Biopsy   Endometrial biopsy shows adenocarcinoma with mixed clear cell and endometrioid, low-grade.   09/04/2021 Imaging   CT A/P: IMPRESSION: 1. 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 No definitive findings to suggest metastatic disease to the chest, abdomen or pelvis. 2. Small left adrenal nodule, stable compared to prior study from 2015, considered benign (likely an adenoma). 3. Aortic atherosclerosis, in addition to 2 vessel coronary artery disease. 4. Additional incidental findings, as above.   09/08/2021 Initial Diagnosis   Endometrial cancer (HCC)   10/18/2021 Surgery   Trh/BSO, bilateral SLN biopsy  Findings: On EUA, small mobile uterus. Normal upper abdominal survey on intra-abdominal entry. Normal omentum, small and large bowel. Significant diverticular disease. Normal  adnexa. Uterus 10cm with some small subserosal fibroids. Atrophic adnexa. Mapping successful to right obturator and left external iliac SLN. Significant bladder prolapse. No intra-abdominal or pelvic evidence of disease. Cystoscopy performed given some difficulty developing bladder flap; dome intact, bilateral ureteral orifices.    10/18/2021 Pathologic Stage   Stage IB mixed clear cell (15%) and grade 1 endometrioid (85%). MI 60% No LVI Negative Slns    11/28/2021 Cancer Staging   Staging form: Corpus Uteri - Carcinoma and Carcinosarcoma, AJCC 8th Edition - Pathologic stage from 11/28/2021: Stage IB (pT1b, pN0, cM0) - Signed by Artis Delay, MD on 11/28/2021 Stage prefix: Initial diagnosis      Past Medical History:  Diagnosis Date   Anemia    Anxiety    Arthritis    CKD (chronic kidney disease) stage 3, GFR 30-59 ml/min    Colon polyp    Diverticulosis    Endometrial cancer    Hypertension    Hypothyroidism    Iron deficiency    Peripheral neuropathy    in bilateral legs   Plantar fasciitis    Pre-diabetes    Prediabetes    Pt denies   Small bowel obstruction     Past Surgical History:  Procedure Laterality Date   CATARACT EXTRACTION     CYSTOSCOPY  10/18/2021   Procedure: CYSTOSCOPY;  Surgeon: Carver Fila, MD;  Location: [facility];  Service: Gynecology;;   EYE SURGERY     MACULAR TEAR     ROBOTIC ASSISTED TOTAL HYSTERECTOMY WITH BILATERAL SALPINGO OOPHERECTOMY N/A 10/18/2021   Procedure: XI ROBOTIC ASSISTED TOTAL HYSTERECTOMY WITH BILATERAL SALPINGO OOPHORECTOMY;  Surgeon: Carver Fila, MD;  Location: [facility];  Service: Gynecology;  Laterality: N/A;   SENTINEL NODE BIOPSY N/A 10/18/2021   Procedure: SENTINEL NODE BIOPSY;  Surgeon: Carver Fila, MD;  Location: [facility];  Service: Gynecology;  Laterality: N/A;   Vericose vein  1965    FAMILY HISTORY:  We obtained a detailed, 4-generation family history.  Significant diagnoses are listed below: Family  History  Problem Relation Age of Onset   Heart disease Mother    Stroke Mother    Heart disease Father    Colon polyps Sister    Diabetes Sister    Stroke Sister    Heart disease Brother    Stroke Brother    Cancer Maternal Aunt    Hypertension Son    Kidney cancer Nephew    Breast cancer Neg Hx    Ovarian cancer Neg Hx    Endometrial cancer Neg Hx    Pancreatic cancer Neg Hx    Prostate cancer Neg Hx     Mikayla Harrison is {aware/unaware} of previous family history of genetic testing for hereditary cancer risks. Patient's maternal ancestors are of *** descent, and paternal ancestors are of *** descent. There {IS NO:12509} reported Ashkenazi Jewish ancestry. There {IS NO:12509} known consanguinity.  GENETIC COUNSELING ASSESSMENT: Mikayla Harrison is a 89 y.o. female with a {Personal/family:20331} history of {cancer/polyps} which is somewhat suggestive of a {DISEASE} and predisposition to cancer given ***. We, therefore, discussed and recommended the following at today's visit.   DISCUSSION: We discussed that *** - ***% of *** is hereditary.  Most cases of *** associated with ***.  There are other genes that can be associated with hereditary *** cancer syndromes.  These include ***.  We discussed that testing is beneficial for several reasons including knowing how to follow individuals for their cancer risks, identifying whether potential treatment options *** would be beneficial, and understanding if other family members could be at risk for cancer and allowing them to undergo genetic testing.   We reviewed the characteristics, features and inheritance patterns of hereditary cancer syndromes. We also discussed genetic testing, including the appropriate family members to test, the process of testing, insurance coverage and turn-around-time for results. We discussed the implications of a negative, positive, carrier and/or variant of uncertain significant result. We recommended Mikayla Harrison pursue genetic  testing for a panel that includes genes associated with *** cancer.   Mikayla Harrison  was offered a common hereditary cancer panel (47 genes) and an expanded pan-cancer panel (77 genes). Mikayla Harrison was informed of the benefits and limitations of each panel, including that expanded pan-cancer panels contain genes that do not have clear management guidelines at this point in time.  We also discussed that as the number of genes included on a panel increases, the chances of variants of uncertain significance increases.  After considering the benefits and limitations of each gene panel, Mikayla Harrison  elected to have *** through ***.   Based on Mikayla Harrison {Personal/family:20331} history of cancer, she meets medical criteria for genetic testing. Despite that she meets criteria, she may still have an out of pocket cost. We discussed that if her out of pocket cost for testing is over $100, the laboratory should contact her and discuss the self-pay prices and/or patient pay assistance programs.    ***We reviewed the characteristics, features and inheritance patterns of hereditary cancer syndromes. We also discussed genetic testing, including the appropriate family members to test, the process of testing, insurance coverage and turn-around-time for results. We discussed the implications  of a negative, positive and/or variant of uncertain significant result. In order to get genetic test results in a timely manner so that Mikayla Harrison can use these genetic test results for surgical decisions, we recommended Mikayla Harrison pursue genetic testing for the ***. Once complete, we recommend Mikayla Harrison pursue reflex genetic testing to the *** gene panel.   Based on Mikayla Harrison's {Personal/family:20331} history of cancer, she meets medical criteria for genetic testing. Despite that she meets criteria, she may still have an out of pocket cost.   ***We discussed with Mikayla Harrison that the {Personal/family:20331} history does not meet insurance or NCCN  criteria for genetic testing and, therefore, is not highly consistent with a familial hereditary cancer syndrome.  We feel she is at low risk to harbor a gene mutation associated with such a condition. Thus, we did not recommend any genetic testing, at this time, and recommended Mikayla Harrison continue to follow the cancer screening guidelines given by her primary healthcare provider.  ***In order to estimate her chance of having a {CA GENE:62345} mutation, we used statistical models ({GENMODELS:62370}) that consider her personal medical history, family history and ancestry.  Because each model is different, there can be a lot of variability in the risks they give.  Therefore, these numbers must be considered a rough range and not a precise risk of having a {CA GENE:62345} mutation.  These models estimate that she has approximately a ***-***% chance of having a mutation. Based on this assessment of her family and personal history, genetic testing {IS/ISNOT:34056} recommended.  ***Based on the patient's {Personal/family:20331} history, a statistical model ({GENMODELS:62370}) was used to estimate her risk of developing {CA HX:54794}. This estimates her lifetime risk of developing {CA HX:54794} to be approximately ***%. This estimation does not consider any genetic testing results.  The patient's lifetime breast cancer risk is a preliminary estimate based on available information using one of several models endorsed by the American Cancer Society (ACS). The ACS recommends consideration of breast MRI screening as an adjunct to mammography for patients at high risk (defined as 20% or greater lifetime risk).   ***Mikayla Harrison has been determined to be at high risk for breast cancer.  Therefore, we recommend that annual screening with mammography and breast MRI be performed.  ***begin at age 1, or 10 years prior to the age of breast cancer diagnosis in a relative (whichever is earlier).  We discussed that Mikayla Harrison should  discuss her individual situation with her referring physician and determine a breast cancer screening plan with which they are both comfortable.    PLAN: After considering the risks, benefits, and limitations, Mikayla Harrison provided informed consent to pursue genetic testing and the blood sample was sent to {Lab} Laboratories for analysis of the {test}. Results should be available within approximately {TAT TIME} weeks' time, at which point they will be disclosed by telephone to Mikayla Harrison. Losh, as will any additional recommendations warranted by these results. Mikayla Harrison. Antonetti will receive a summary of her genetic counseling visit and a copy of her results once available. This information will also be available in Epic.   *** Despite our recommendation, Mikayla Harrison. Bottcher did not wish to pursue genetic testing at today's visit. We understand this decision and remain available to coordinate genetic testing at any time in the future. We, therefore, recommend Mikayla Harrison. Suares continue to follow the cancer screening guidelines given by her primary healthcare provider.  ***Based on Mikayla Harrison. Schmale's family history, we recommended her ***, who was diagnosed  with *** at age ***, have genetic counseling and testing. Mikayla Harrison. Ueda will let us know if we can be of any assistance in coordinating genetic counseling and/or testing for this family member.   Lastly, we encouraged Mikayla Harrison. Harlin to remain in contact with cancer genetics annually so that we can continuously update the family history and inform her of any changes in cancer genetics and testing that may be of benefit for this family.   Mikayla Harrison. Hannis questions were answered to her satisfaction today. Our contact information was provided should additional questions or concerns arise. Thank you for the referral and allowing [place] to share in the care of your patient.   Ronie Fleeger M. Rennie Plowman, Mikayla Harrison, [facility] Genetic Counselor [email or web address] (P) [phone]  The patient was seen for a total of *** minutes in  face-to-face genetic counseling.  ***The was accompanied by ***.  ***The patient was seen alone.  Drs. Pamelia Hoit and/or Mosetta Putt were available to discuss this case as needed.    _______________________________________________________________________ For Office Staff:  Number of people involved in session: *** Was an Intern/ student involved with case: {YES/NO:63}

## 2022-10-01 ENCOUNTER — Inpatient Hospital Stay: Payer: Medicare HMO

## 2022-10-01 ENCOUNTER — Inpatient Hospital Stay: Payer: Medicare HMO | Attending: Gynecologic Oncology | Admitting: Genetic Counselor

## 2022-10-17 NOTE — Progress Notes (Unsigned)
GYNECOLOGY  VISIT   HPI: 89 y.o.   Widowed  Caucasian  female   G5P5 with No LMP recorded. Patient has had a hysterectomy.   here for   unable to void. Pt is unable to leave specimen.  Pt had hyst surgery a year ago since last May.  Symptoms since surgery.   Not getting worse.  DF - hourly.    NF - 2 times.  Some urgency with leak seldom.   No Enuresis.   No bulge from vagina.   3 glasses of water per day.    Urine can be clear of golden in color.   Sometimes has pain with voiding.   Dx endometrial cancer.   Stream can be normal at times but not always.   PCP prescribed fluid pills.   Low GFR 58 09/18/22.   GYNECOLOGIC HISTORY: No LMP recorded. Patient has had a hysterectomy. Contraception:  hyst Menopausal hormone therapy:  n/a Last mammogram:  03/30/22 Breast Density Cat A, BI-RADS CAT 0 incomplete Last pap smear:   06/26/21 ASCUS: HR HPV neg        OB History     Gravida  5   Para  5   Term      Preterm      AB      Living  5      SAB      IAB      Ectopic      Multiple      Live Births                 Patient Active Problem List   Diagnosis Date Noted   Endometrial cancer (HCC) 09/08/2021   Edema of left lower extremity 09/08/2021   Earlobe pain 08/04/2020   Enrolled in chronic care management 11/14/2018   Normocytic anemia 06/10/2018   CREST syndrome (HCC) 02/12/2018   Abnormal glucose 10/10/2017   FHx: heart disease 10/10/2017   History of TIA (transient ischemic attack) 11/06/2016   Raynaud phenomenon 08/01/2015   Overweight (BMI 25.0-29.9) 03/22/2015   Unifocal PVCs 11/02/2014   CKD (chronic kidney disease) stage 3, GFR 30-59 ml/min (HCC) 03/16/2014   Hyperlipidemia, mixed 10/05/2013   Screening for colorectal cancer 10/05/2013   Vitamin D deficiency 10/05/2013   Prediabetes    Diverticulosis    Essential hypertension 01/14/2012   Hypothyroidism 01/14/2012    Past Medical History:  Diagnosis Date   Anemia    Anxiety     Arthritis    CKD (chronic kidney disease) stage 3, GFR 30-59 ml/min (HCC)    Colon polyp    Diverticulosis    Endometrial cancer (HCC)    Hypertension    Hypothyroidism    Iron deficiency    Peripheral neuropathy    in bilateral legs   Plantar fasciitis    Pre-diabetes    Prediabetes    Pt denies   Small bowel obstruction (HCC)     Past Surgical History:  Procedure Laterality Date   CATARACT EXTRACTION     CYSTOSCOPY  10/18/2021   Procedure: CYSTOSCOPY;  Surgeon: Carver Fila, MD;  Location: [facility];  Service: Gynecology;;   EYE SURGERY     MACULAR TEAR     ROBOTIC ASSISTED TOTAL HYSTERECTOMY WITH BILATERAL SALPINGO OOPHERECTOMY N/A 10/18/2021   Procedure: XI ROBOTIC ASSISTED TOTAL HYSTERECTOMY WITH BILATERAL SALPINGO OOPHORECTOMY;  Surgeon: Carver Fila, MD;  Location: [facility];  Service: Gynecology;  Laterality: N/A;   SENTINEL NODE BIOPSY N/A  10/18/2021   Procedure: SENTINEL NODE BIOPSY;  Surgeon: Carver Fila, MD;  Location: [facility];  Service: Gynecology;  Laterality: N/A;   Vericose vein  1965    Current Outpatient Medications  Medication Sig Dispense Refill   amLODipine (NORVASC) 10 MG tablet Take 10 mg by mouth daily.     Ascorbic Acid (VITAMIN C PO) Take 1,000 mg by mouth daily.     benazepril (LOTENSIN) 40 MG tablet Take 1 tablet by mouth once daily for blood pressure 90 tablet 0   Cholecalciferol (VITAMIN D3) 125 MCG (5000 UT) TABS Take 5,000 Units by mouth daily.     clorazepate (TRANXENE) 7.5 MG tablet Take 1/2 - 1 tablet 2 - 3 x /day ONLY if needed for Anxiety Attack &  limit to 5 days /week to avoid Addiction & Dementia (Patient taking differently: Take 1.875 mg by mouth daily as needed for anxiety. Take 1/2 - 1 tablet 2 - 3 x /day ONLY if needed for Anxiety Attack &  limit to 5 days /week to avoid Addiction & Dementia) 30 tablet 0   clotrimazole (LOTRIMIN) 1 % cream Apply 1 application topically 2 (two) times daily. 30 g 0   Cyanocobalamin  (VITAMIN B-12) 2500 MCG SUBL Take 2,500 mcg by mouth daily.     Magnesium 250 MG TABS Take 1 tablet by mouth daily.     meclizine (ANTIVERT) 25 MG tablet Take 1/2 to 1  3 x  /day as needed for Dizziness / Vertigo 90 tablet 3   SYNTHROID 150 MCG tablet Take  1 tablet  Daily  on an empty stomach with only water for 30 minutes & no Antacid meds, Calcium or Magnesium for 4 hours & avoid Biotin 90 tablet 3   zinc gluconate 50 MG tablet Take 50 mg by mouth daily.     No current facility-administered medications for this visit.     ALLERGIES: Sulfa antibiotics, Codeine, Prednisone, and Asa [aspirin]  Family History  Problem Relation Age of Onset   Heart disease Mother    Stroke Mother    Heart disease Father    Colon polyps Sister    Diabetes Sister    Stroke Sister    Heart disease Brother    Stroke Brother    Cancer Maternal Aunt    Hypertension Son    Kidney cancer Nephew    Breast cancer Neg Hx    Ovarian cancer Neg Hx    Endometrial cancer Neg Hx    Pancreatic cancer Neg Hx    Prostate cancer Neg Hx     Social History   Socioeconomic History   Marital status: Widowed    Spouse name: Not on file   Number of children: Not on file   Years of education: Not on file   Highest education level: Not on file  Occupational History   Not on file  Tobacco Use   Smoking status: Former    Packs/day: 1.00    Years: 15.00    Additional pack years: 0.00    Total pack years: 15.00    Types: Cigarettes    Quit date: 06/11/1965    Years since quitting: 57.4   Smokeless tobacco: Never  Vaping Use   Vaping Use: Never used  Substance and Sexual Activity   Alcohol use: No   Drug use: No   Sexual activity: Not Currently  Other Topics Concern   Not on file  Social History Narrative   Not on file  Social Determinants of Health   Financial Resource Strain: Not on file  Food Insecurity: Not on file  Transportation Needs: Not on file  Physical Activity: Not on file  Stress: Not on  file  Social Connections: Not on file  Intimate Partner Violence: Not on file    Review of Systems  All other systems reviewed and are negative.   PHYSICAL EXAMINATION:    BP 92/60 (BP Location: Left Arm, Patient Position: Sitting, Cuff Size: Normal)   Pulse 64   Ht 5\' 2"  (1.575 m)   Wt 146 lb (66.2 kg)   SpO2 98%   BMI 26.70 kg/m     General appearance: alert, cooperative and appears stated age Head: Normocephalic, without obvious abnormality, atraumatic Neck: no adenopathy, supple, symmetrical, trachea midline and thyroid normal to inspection and palpation Lungs: clear to auscultation bilaterally Breasts: normal appearance, no masses or tenderness, No nipple retraction or dimpling, No nipple discharge or bleeding, No axillary or supraclavicular adenopathy Heart: regular rate and rhythm Abdomen: soft, non-tender, no masses,  no organomegaly Extremities: extremities normal, atraumatic, no cyanosis or edema Skin: Skin color, texture, turgor normal. No rashes or lesions Lymph nodes: Cervical, supraclavicular, and axillary nodes normal. No abnormal inguinal nodes palpated Neurologic: Grossly normal  Pelvic: External genitalia:  no lesions              Urethra:  normal appearing urethra with no masses, tenderness or lesions              Bartholins and Skenes: normal                 Vagina: normal appearing vagina with normal color and discharge, no lesions              Cervix: no lesions                Bimanual Exam:  Uterus:  normal size, contour, position, consistency, mobility, non-tender              Adnexa: no mass, fullness, tenderness              Rectal exam: {yes no:314532}.  Confirms.              Anus:  normal sphincter tone, no lesions  Chaperone was present for exam:  ***  ASSESSMENT     PLAN     An After Visit Summary was printed and given to the patient.  ______ minutes face to face time of which over 50% was spent in counseling.

## 2022-10-18 ENCOUNTER — Inpatient Hospital Stay: Payer: Medicare HMO | Admitting: Gynecologic Oncology

## 2022-10-18 DIAGNOSIS — C541 Malignant neoplasm of endometrium: Secondary | ICD-10-CM

## 2022-10-31 ENCOUNTER — Ambulatory Visit (INDEPENDENT_AMBULATORY_CARE_PROVIDER_SITE_OTHER): Payer: Medicare HMO | Admitting: Obstetrics and Gynecology

## 2022-10-31 ENCOUNTER — Encounter: Payer: Self-pay | Admitting: Obstetrics and Gynecology

## 2022-10-31 VITALS — BP 92/60 | HR 64 | Ht 62.0 in | Wt 146.0 lb

## 2022-10-31 DIAGNOSIS — R339 Retention of urine, unspecified: Secondary | ICD-10-CM

## 2022-10-31 LAB — URINALYSIS, COMPLETE W/RFL CULTURE
Bacteria, UA: NONE SEEN /HPF
Bilirubin Urine: NEGATIVE
Casts: NONE SEEN /LPF
Crystals: NONE SEEN /HPF
Glucose, UA: NEGATIVE
Hyaline Cast: NONE SEEN /LPF
Ketones, ur: NEGATIVE
Leukocyte Esterase: NEGATIVE
Nitrites, Initial: NEGATIVE
Protein, ur: NEGATIVE
RBC / HPF: NONE SEEN /HPF (ref 0–2)
Specific Gravity, Urine: 1.01 (ref 1.001–1.035)
WBC, UA: NONE SEEN /HPF (ref 0–5)
Yeast: NONE SEEN /HPF
pH: 5.5 (ref 5.0–8.0)

## 2022-10-31 LAB — NO CULTURE INDICATED

## 2022-11-15 ENCOUNTER — Other Ambulatory Visit: Payer: Self-pay | Admitting: Nurse Practitioner

## 2022-11-15 DIAGNOSIS — I10 Essential (primary) hypertension: Secondary | ICD-10-CM

## 2022-11-20 ENCOUNTER — Ambulatory Visit: Payer: Medicare HMO | Admitting: Nurse Practitioner

## 2022-11-22 NOTE — Progress Notes (Deleted)
Assessment and Plan:  There are no diagnoses linked to this encounter.    Further disposition pending results of labs. Discussed med's effects and SE's.   Over 30 minutes of exam, counseling, chart review, and critical decision making was performed.   Future Appointments  Date Time Provider Department Center  11/23/2022  9:30 AM Raynelle Dick, NP [facility] None  12/20/2022  1:15 PM Carver Fila, MD [facility] None  12/25/2022 11:30 AM Raynelle Dick, NP [facility] None  04/16/2023 11:00 AM Lucky Cowboy, MD [facility] None  06/13/2023 11:00 AM Raynelle Dick, NP [facility] None    ------------------------------------------------------------------------------------------------------------------   HPI There were no vitals taken for this visit. 89 y.o.female presents for  Past Medical History:  Diagnosis Date   Anemia    Anxiety    Arthritis    CKD (chronic kidney disease) stage 3, GFR 30-59 ml/min (HCC)    Colon polyp    Diverticulosis    Endometrial cancer (HCC)    Hypertension    Hypothyroidism    Iron deficiency    Peripheral neuropathy    in bilateral legs   Plantar fasciitis    Pre-diabetes    Prediabetes    Pt denies   Small bowel obstruction (HCC)      Allergies  Allergen Reactions   Sulfa Antibiotics Other (See Comments)   Codeine Nausea Only   Prednisone Nausea And Vomiting, Nausea Only and Other (See Comments)   Asa [Aspirin] Anxiety and Other (See Comments)    Take 81mg  cannot take 325mg  gets nervous    Current Outpatient Medications on File Prior to Visit  Medication Sig   amLODipine (NORVASC) 10 MG tablet Take 10 mg by mouth daily.   Ascorbic Acid (VITAMIN C PO) Take 1,000 mg by mouth daily.   benazepril (LOTENSIN) 40 MG tablet Take 1 tablet by mouth once daily for blood pressure   Cholecalciferol (VITAMIN D3) 125 MCG (5000 UT) TABS Take 5,000 Units by mouth daily.   clorazepate (TRANXENE) 7.5 MG tablet Take 1/2 - 1 tablet 2 - 3 x  /day ONLY if needed for Anxiety Attack &  limit to 5 days /week to avoid Addiction & Dementia (Patient taking differently: Take 1.875 mg by mouth daily as needed for anxiety. Take 1/2 - 1 tablet 2 - 3 x /day ONLY if needed for Anxiety Attack &  limit to 5 days /week to avoid Addiction & Dementia)   clotrimazole (LOTRIMIN) 1 % cream Apply 1 application topically 2 (two) times daily.   Cyanocobalamin (VITAMIN B-12) 2500 MCG SUBL Take 2,500 mcg by mouth daily.   Magnesium 250 MG TABS Take 1 tablet by mouth daily.   meclizine (ANTIVERT) 25 MG tablet Take 1/2 to 1  3 x  /day as needed for Dizziness / Vertigo   SYNTHROID 150 MCG tablet Take  1 tablet  Daily  on an empty stomach with only water for 30 minutes & no Antacid meds, Calcium or Magnesium for 4 hours & avoid Biotin   zinc gluconate 50 MG tablet Take 50 mg by mouth daily.   No current facility-administered medications on file prior to visit.    ROS: all negative except above.   Physical Exam:  There were no vitals taken for this visit.  General Appearance: Well nourished, in no apparent distress. Eyes: PERRLA, EOMs, conjunctiva no swelling or erythema Sinuses: No Frontal/maxillary tenderness ENT/Mouth: Ext aud canals clear, TMs without erythema, bulging. No erythema, swelling, or exudate on post pharynx.  Tonsils not swollen or erythematous. Hearing normal.  Neck: Supple, thyroid normal.  Respiratory: Respiratory effort normal, BS equal bilaterally without rales, rhonchi, wheezing or stridor.  Cardio: RRR with no MRGs. Brisk peripheral pulses without edema.  Abdomen: Soft, + BS.  Non tender, no guarding, rebound, hernias, masses. Lymphatics: Non tender without lymphadenopathy.  Musculoskeletal: Full ROM, 5/5 strength, normal gait.  Skin: Warm, dry without rashes, lesions, ecchymosis.  Neuro: Cranial nerves intact. Normal muscle tone, no cerebellar symptoms. Sensation intact.  Psych: Awake and oriented X 3, normal affect, Insight and  Judgment appropriate.     Raynelle Dick, NP 2:49 PM [facility]

## 2022-11-23 ENCOUNTER — Ambulatory Visit: Payer: Medicare HMO | Admitting: Nurse Practitioner

## 2022-12-12 ENCOUNTER — Telehealth: Payer: Self-pay

## 2022-12-12 NOTE — Telephone Encounter (Signed)
Per patient she is going to call and schedule on Monday.

## 2022-12-17 ENCOUNTER — Encounter: Payer: Self-pay | Admitting: Internal Medicine

## 2022-12-17 ENCOUNTER — Ambulatory Visit (INDEPENDENT_AMBULATORY_CARE_PROVIDER_SITE_OTHER): Payer: Medicare HMO | Admitting: Internal Medicine

## 2022-12-17 VITALS — BP 110/60 | HR 65 | Temp 97.9°F | Resp 16 | Ht 62.0 in | Wt 141.4 lb

## 2022-12-17 DIAGNOSIS — J309 Allergic rhinitis, unspecified: Secondary | ICD-10-CM | POA: Diagnosis not present

## 2022-12-17 DIAGNOSIS — M26629 Arthralgia of temporomandibular joint, unspecified side: Secondary | ICD-10-CM | POA: Diagnosis not present

## 2022-12-17 MED ORDER — DEXAMETHASONE 2 MG PO TABS: ORAL_TABLET | ORAL | 0 refills | Status: DC

## 2022-12-17 MED ORDER — PSEUDOEPHEDRINE HCL ER 120 MG PO TB12: ORAL_TABLET | ORAL | 0 refills | Status: DC

## 2022-12-17 NOTE — Patient Instructions (Signed)
Temporomandibular Joint Syndrome   Temporomandibular joint syndrome (TMJ syndrome) is a condition that causes pain in the temporomandibular joints. These joints are located near your ears and allow your jaw to open and close. For people with TMJ syndrome, chewing, biting, or other movements of the jaw can be difficult or painful. TMJ syndrome is often mild and goes away within a few weeks. However, sometimes the condition becomes a long-term (chronic) problem. What are the causes? This condition may be caused by: Grinding your teeth or clenching your jaw. Some people do this when they are stressed. Arthritis. An injury to the jaw. A head or neck injury. Teeth or dentures that are not aligned well. In some cases, the cause of TMJ syndrome may not be known. What are the signs or symptoms? The most common symptom of this condition is aching pain on the side of the head in the area of the TMJ. Other symptoms may include: Pain when moving your jaw, such as when chewing or biting. Not being able to open your jaw all the way. Making a clicking sound when you open your mouth. Headache. Earache. Neck or shoulder pain. How is this diagnosed? This condition may be diagnosed based on: Your symptoms and medical history. A physical exam. Your health care provider may check the range of motion of your jaw. Imaging tests, such as X-rays or an MRI. You may also need to see your dentist, who will check if your teeth and jaw are lined up correctly. How is this treated? TMJ syndrome often goes away on its own. If treatment is needed, it may include: Eating soft foods and applying ice or heat. Medicines to relieve pain or inflammation. Medicines or massage to relax the muscles. A splint, bite plate, or mouthpiece to prevent teeth grinding or jaw clenching. Relaxation techniques or counseling to help reduce stress. A therapy for pain in which an electrical current is applied to the nerves through the  skin (transcutaneous electrical nerve stimulation). Acupuncture. This may help to relieve pain. Jaw surgery. This is rarely needed. Follow these instructions at home:  Eating and drinking Eat a soft diet if you are having trouble chewing. Avoid foods that require a lot of chewing. Do not chew gum. General instructions Take over-the-counter and prescription medicines only as told by your health care provider. If directed, put ice on the painful area. To do this: Put ice in a plastic bag. Place a towel between your skin and the bag. Leave the ice on for 20 minutes, 2-3 times a day. Remove the ice if your skin turns bright red. This is very important. If you cannot feel pain, heat, or cold, you have a greater risk of damage to the area. Apply a warm, wet cloth (warm compress) to the painful area as told. Massage your jaw area and do any jaw stretching exercises as told by your health care provider. If you were given a splint, bite plate, or mouthpiece, wear it as told by your health care provider. Keep all follow-up visits. This is important. Where to find more information National Institute of [employer]io[employer]: www.nidcr.nih.gov Contact a health care provider if: You have trouble eating. You have new or worsening symptoms. Get help right away if: Your jaw locks. Summary Temporomandibular joint syndrome (TMJ syndrome) is a condition that causes pain in the temporomandibular joints. These joints are located near your ears and allow your jaw to open and close. TMJ syndrome is often mild and goes away   within a few weeks. However, sometimes the condition becomes a long-term (chronic) problem. Symptoms include an aching pain on the side of the head in the area of the TMJ, pain when chewing or biting, and being unable to open your jaw all the way. You may also make a clicking sound when you open your mouth. TMJ syndrome often goes away on its own. If treatment is needed, it may  include medicines to relieve pain, reduce inflammation, or relax the muscles. A splint, bite plate, or mouthpiece may also be used to prevent teeth grinding or jaw clenching.  

## 2022-12-17 NOTE — Progress Notes (Signed)
Future Appointments  Date Time Provider Department  12/17/2022                 ov  2:00 PM Lucky Cowboy, MD [facility]  12/20/2022  1:15 PM Carver Fila, MD [facility]  12/25/2022               ov 11:30 AM Raynelle Dick, NP [facility]  04/16/2023               cpe 11:00 AM Lucky Cowboy, MD [facility]  06/13/2023                wellness 11:00 AM Raynelle Dick, NP [facility]    History of Present Illness:      The patient is a very nice 89 y.o. WWF with HTN, HLD, Hypothyroidism,  Pre-Diabetes and Vitamin D Deficiency who presents with c/o Rt ear aching discomfort and a sensation of "fullness" of "being stopped up" and also c/o sinus congestion. Denies fevers, chills , PND /sputum .    Current Outpatient Medications on File Prior to Visit  Medication Sig   amLODipine 10 MG tablet Take 1daily.   VITAMIN C 1,000 mg  Take daily.   benazepril (LOTENSIN) 40 MG tablet Take 1 tablet daily   VITAMIN D 5000 u Take daily.   clorazepate 7.5 MG tablet Take 1/2 - 1 tablet 2 - 3 x /day ONLY if needed    clotrimazole (LOTRIMIN) 1 % cream Apply  2 times daily.   VITAMIN B-12    2500 mcg SL Take  daily.   Magnesium 250 MG TABS Take 1 tablet  daily.   meclizine (ANTIVERT) 25 MG tablet Take 1/2 to 1  3 x  /day as needed    levothyroxine150 MCG tablet Take  1 tablet  Daily     zinc  50 MG tablet Take  daily.     Allergies  Allergen Reactions   Sulfa Antibiotics Other (See Comments)   Codeine Nausea Only   Prednisone Nausea And Vomiting, Nausea Only and Other (See Comments)   Asa [Aspirin] Anxiety and Other (See Comments)    Take 81mg  cannot take 325mg  gets nervous     Problem list She has Essential hypertension; Hypothyroidism; Prediabetes; Diverticulosis; Hyperlipidemia, mixed; Screening for colorectal cancer; Vitamin D deficiency; CKD (chronic kidney disease) stage 3, GFR 30-59 ml/min (HCC); Unifocal PVCs; Overweight (BMI 25.0-29.9); Raynaud phenomenon; History of TIA  (transient ischemic attack); Abnormal glucose; FHx: heart disease; CREST syndrome (HCC); Normocytic anemia; Enrolled in chronic care management; [place]obe pain; Endometrial cancer (HCC); and Edema of left lower extremity on their problem list.   Observations/Objective:  BP 110/60   Pulse 65   Temp 97.9 F (36.6 C)   Resp 16   Ht 5\' 2"  (1.575 m)   Wt 141 lb 6.4 oz (64.1 kg)   SpO2 98%   BMI 25.86 kg/m   HEENT - Bilat TM jts tender Rt>Lt. EACs & TMs Nl. (+) tender Fronto maxillary sinus areas . N/O/P - clear.  Neck - supple.  Chest - Clear equal BS. Cor - Nl HS. RRR w/o sig MGR. PP 1(+). No edema. MS- FROM w/o deformities.  Gait Nl. Neuro -  Nl w/o focal abnormalities.   Assessment and Plan:   1. Allergic sinusitis  - dexamethasone 2 MG tablet;  Take 1 tab 3 x day - 3 days, then 2 x day - 3 days, then 1 tab daily   Disp: 20 tablet;  Refill: 0  - pseudoephedrine  120 MG 12 hr tablet;  Take  1 tablet  2 x /day (every 12 hours)   Dispense: 30 tablet; Refill: 0  2. Arthralgia of temporomandibular joints  - dexamethasone 2 MG tablet;  Take 1 tab 3 x day - 3 days, then 2 x day - 3 days, then 1 tab daily   Dispense: 20 tablet; Refill: 0   Follow Up Instructions:        I discussed the assessment and treatment plan with the patient. The patient was provided an opportunity to ask questions and all were answered. The patient agreed with the plan and demonstrated an understanding of the instructions.       The patient was advised to call back or seek an in-person evaluation if the symptoms worsen or if the condition fails to improve as anticipated.    Marinus Maw, MD

## 2022-12-19 ENCOUNTER — Other Ambulatory Visit: Payer: Self-pay | Admitting: Nurse Practitioner

## 2022-12-19 DIAGNOSIS — I10 Essential (primary) hypertension: Secondary | ICD-10-CM

## 2022-12-19 NOTE — Progress Notes (Unsigned)
Patient called am of her appointment to cancel

## 2022-12-20 ENCOUNTER — Inpatient Hospital Stay: Payer: Medicare HMO | Admitting: Gynecologic Oncology

## 2022-12-20 DIAGNOSIS — C541 Malignant neoplasm of endometrium: Secondary | ICD-10-CM

## 2022-12-24 NOTE — Progress Notes (Deleted)
MEDICARE ANNUAL WELLNESS VISIT AND FOLLOW UP   Assessment:     Encounter for Medicare annual wellness exam Yearly  Essential hypertension - continue medications, DASH diet, exercise and monitor at home. Call if greater than 130/80.  -check BP in the morning and at 9 oclock - taking benazepril and bASA in the morning, take norvasc 1/2 at 7 without checking BP before that - if your BP is over 140/80 at 10 pm, you can take 1/2 of the atenolol  Hyperlipidemia At goal without medications at this time;  Continue low cholesterol diet and exercise.  Defer lipid panel as would not aggressively treat secondary to age  Abnormal glucose Discussed disease and risks Discussed diet/exercise, weight management  Defer A1C- check CMP for glucose  CKD Stage III (GFR 56 ml/min) Increase fluids, avoid NSAIDS, monitor sugars, will monitor - CMP/GFR  Hypothyroidism, unspecified hypothyroidism type -check TSH level, continue medications the same, reminded to take on an empty stomach 30-86mins before food.    Diverticulosis of intestine without bleeding, unspecified intestinal tract location Controlled, diet   Raynaud phenomenon Declines meds at this time  Overweight Long discussion about weight loss, diet, and exercise Recommended diet heavy in fruits and veggies and low in animal meats, cheeses, and dairy products, appropriate calorie intake Discussed appropriate weight for height Follow up at next visit  History of TIA Control B/P, lipids, glucose bASA  Bone spur of left foot Encouraged to follow up with orthopedics Encouraged supportive shoes   Edema of lower extremities Encourage compression socks and elevating feet above heart level as much as possible Limit salt intake and push fluids  Estrogen Deficiency DEXA UTD  Vitamin D deficiency Continue supplementation to maintain goal of 70-100 Taking Vitamin D 2,000 IU daily Check vitamin D level  Unifocal PVCs  Monitor  yearly, check labs  Medication management Continued    Further disposition pending results if labs check today. Discussed med's effects and SE's.   Over 30 minutes of face to face interview, exam, counseling, chart review, and critical decision making was performed.    Future Appointments  Date Time Provider Department Center  12/25/2022 11:30 AM Raynelle Dick, NP [facility] None  12/28/2022 12:00 PM Carver Fila, MD [facility] None  04/16/2023 11:00 AM Lucky Cowboy, MD [facility] None  12/26/2023 11:30 AM Raynelle Dick, NP [facility] None     Plan:   During the course of the visit the patient was educated and counseled about appropriate screening and preventive services including:   Pneumococcal vaccine  Influenza vaccine Td vaccine Screening electrocardiogram Screening mammography Bone densitometry screening Colorectal cancer screening Diabetes screening Glaucoma screening Nutrition counseling  Advanced directives: given info/requested   Subjective:   Mikayla Harrison is a 89 y.o. female who presents for Medicare Annual Wellness Visit and 3 month follow up on hypertension, prediabetes, hyperlipidemia, vitamin D def.   She has had ongoing ear pain at her jaw, causes dizziness, states if she opens and closes her mouth she can "pop" her ear and make it feel better/relieve the issues.    Plantar fascitis and she has bone spurs on the left foot.  She has been to Foot specialist, Dr Loreta Ave. She has used Gabapentin but the medication makes her sick and can not use. Dr Dion Saucier has put shots in her knees for arthritis  She has noted a rash under her breast for approximately 6 weeks, very itchy  Has tried hydrocortisone with no relief   She has had  vaginal discharge that is blood tinged- was to be seen by gynecologist but left before her appointment. Needs to be referred  Continues to have swelling in her right lower leg. She has a history of CREST syndrome, may need  to follow up with rheumatology.   She lives by herself, has a lot of anxiety at night, has clorazepate if needed, takes 1/2-1/4 tab occasionally.    BMI is There is no height or weight on file to calculate BMI., she has not been working on diet and exercise. Wt Readings from Last 3 Encounters:  12/17/22 141 lb 6.4 oz (64.1 kg)  10/31/22 146 lb (66.2 kg)  09/18/22 147 lb 6.4 oz (66.9 kg)   Her blood pressure has been controlled at home, today their BP is  .  BP Readings from Last 3 Encounters:  12/17/22 110/60  10/31/22 92/60  09/18/22 118/60   She was switched from ACE to ARB due to cough however she has not switched and states cough got better with ABX/prednisone.  She checks her BP in the AM, at 5 pm occ will do 1/4 of atenolol or 1/2 of amlodipine, then she will check it at 8 and it will begin to accerate   She does not workout but stays active around the house. She denies chest pain, shortness of breath, dizziness.   She is not on cholesterol medication and denies myalgias. Her cholesterol is at goal. The cholesterol last visit was:   Lab Results  Component Value Date   CHOL 133 09/18/2022   HDL 46 (L) 09/18/2022   LDLCALC 69 09/18/2022   TRIG 99 09/18/2022   CHOLHDL 2.9 09/18/2022    She has CKD stage III due to HTN. Lab Results  Component Value Date   GFRNONAA >60 10/19/2021   She has been working on diet and exercise for prediabetes, and denies paresthesia of the feet, polydipsia, polyuria and visual disturbances. She is on bASA.  Last A1C in the office was:  Lab Results  Component Value Date   HGBA1C 6.1 (H) 09/18/2022   Patient is on Vitamin D supplement. Lab Results  Component Value Date   VD25OH 103 (H) 09/18/2022     She is on thyroid medication. Her medication was not changed last visit.   Lab Results  Component Value Date   TSH 1.18 08/01/2022   Patient was held overnight for observation in May 2018 with a (-) Head CT, MRI & carotid dopplers and she  was discharged home on LD bASA for suspected possible TIA.  Medication Review Current Outpatient Medications on File Prior to Visit  Medication Sig   amLODipine (NORVASC) 10 MG tablet Take 10 mg by mouth daily.   Ascorbic Acid (VITAMIN C PO) Take 1,000 mg by mouth daily.   benazepril (LOTENSIN) 40 MG tablet Take 1 tablet by mouth once daily for blood pressure   Cholecalciferol (VITAMIN D3) 125 MCG (5000 UT) TABS Take 5,000 Units by mouth daily.   clorazepate (TRANXENE) 7.5 MG tablet Take 1/2 - 1 tablet 2 - 3 x /day ONLY if needed for Anxiety Attack &  limit to 5 days /week to avoid Addiction & Dementia (Patient taking differently: Take 1.875 mg by mouth daily as needed for anxiety. Take 1/2 - 1 tablet 2 - 3 x /day ONLY if needed for Anxiety Attack &  limit to 5 days /week to avoid Addiction & Dementia)   clotrimazole (LOTRIMIN) 1 % cream Apply 1 application topically 2 (two) times daily.  Cyanocobalamin (VITAMIN B-12) 2500 MCG SUBL Take 2,500 mcg by mouth daily.   dexamethasone (DECADRON) 2 MG tablet Take 1 tab 3 x day - 3 days, then 2 x day - 3 days, then 1 tab daily   Magnesium 250 MG TABS Take 1 tablet by mouth daily.   meclizine (ANTIVERT) 25 MG tablet Take 1/2 to 1  3 x  /day as needed for Dizziness / Vertigo   pseudoephedrine (SUDAFED) 120 MG 12 hr tablet Take  1 tablet  2 x /day (every 12 hours)    for Ear,  Sinus  & Chest Congestion   SYNTHROID 150 MCG tablet Take  1 tablet  Daily  on an empty stomach with only water for 30 minutes & no Antacid meds, Calcium or Magnesium for 4 hours & avoid Biotin   zinc gluconate 50 MG tablet Take 50 mg by mouth daily.   No current facility-administered medications on file prior to visit.    Current Problems (verified) Patient Active Problem List   Diagnosis Date Noted   Endometrial cancer (HCC) 09/08/2021   Edema of left lower extremity 09/08/2021   Earlobe pain 08/04/2020   Enrolled in chronic care management 11/14/2018   Normocytic anemia  06/10/2018   CREST syndrome (HCC) 02/12/2018   Abnormal glucose 10/10/2017   FHx: heart disease 10/10/2017   History of TIA (transient ischemic attack) 11/06/2016   Raynaud phenomenon 08/01/2015   Overweight (BMI 25.0-29.9) 03/22/2015   Unifocal PVCs 11/02/2014   CKD (chronic kidney disease) stage 3, GFR 30-59 ml/min (HCC) 03/16/2014   Hyperlipidemia, mixed 10/05/2013   Screening for colorectal cancer 10/05/2013   Vitamin D deficiency 10/05/2013   Prediabetes    Diverticulosis    Essential hypertension 01/14/2012   Hypothyroidism 01/14/2012    Screening Tests Immunization History  Administered Date(s) Administered   DT (Pediatric) 03/22/2015   Influenza Split 03/04/2013   Influenza, High Dose Seasonal PF 03/16/2014, 03/22/2015, 02/15/2016, 04/23/2017, 04/30/2018, 04/08/2019, 06/19/2021   Pneumococcal Polysaccharide-23 03/16/2014   Td 10/10/2017    Preventative care: Last colonoscopy:  2004, will not get another EGD: 2012, Dr. Marina Goodell Last mammogram: 2011, declines another and declines breast exam at this time.  Last pap smear/pelvic exam: remote DEXA:remote at Dr. Dennie Bible office, willing to get  Prior vaccinations: TD or Tdap:2019 Influenza: 06/19/21 Pneumococcal: 03/2014 Prevnar13:  03/2015 Shingles/Zostavax: declines  Names of Other Physician/Practitioners you currently use: 1. [facility]- here for primary care 2. Dr. Emily Filbert, eye doctor, 2021 3. , dentist, no- has dentures  Patient Care Team: Lucky Cowboy, MD as PCP - General (Internal Medicine) Patton Salles, MD as Consulting Physician (Obstetrics and Gynecology)  Allergies Allergies  Allergen Reactions   Sulfa Antibiotics Other (See Comments)   Codeine Nausea Only   Prednisone Nausea And Vomiting, Nausea Only and Other (See Comments)   Asa [Aspirin] Anxiety and Other (See Comments)    Take 81mg  cannot take 325mg  gets nervous    SURGICAL HISTORY She   has a past surgical history that includes Cataract extraction; Vericose vein (1965); MACULAR TEAR; Eye surgery; Robotic assisted total hysterectomy with bilateral salpingo oophorectomy (N/A, 10/18/2021); Sentinel node biopsy (N/A, 10/18/2021); and Cystoscopy (10/18/2021). FAMILY HISTORY Her family history includes Cancer in her maternal aunt; Colon polyps in her sister; Diabetes in her sister; Heart disease in her brother, father, and mother; Hypertension in her son; Kidney cancer in her nephew; Stroke in her brother, mother, and sister. SOCIAL HISTORY She  reports  that she quit smoking about 57 years ago. Her smoking use included cigarettes. She started smoking about 72 years ago. She has a 15 pack-year smoking history. She has never used smokeless tobacco. She reports that she does not drink alcohol and does not use drugs.  MEDICARE WELLNESS OBJECTIVES: Physical activity: yard work and walking Depression/mood screen:      12/17/2022    9:21 PM  Depression screen PHQ 2/9  Decreased Interest 0  Down, Depressed, Hopeless 0  PHQ - 2 Score 0   ADLs:     03/03/2022    8:43 PM  In your present state of health, do you have any difficulty performing the following activities:  Hearing? 1  Comment hearing aids  Vision? 0  Difficulty concentrating or making decisions? 0  Walking or climbing stairs? 0  Comment cane / walker  Dressing or bathing? 0  Doing errands, shopping? 0    Cognitive Testing  Alert? Yes  Normal Appearance?Yes  Oriented to person? Yes  Place? Yes   Time? Yes  Recall of three objects?  Yes  Can perform simple calculations? Yes  Displays appropriate judgment?Yes  Can read the correct time from a watch face?Yes  EOL planning:   No, declined information   Objective:   There were no vitals taken for this visit. There is no height or weight on file to calculate BMI.  General Appearance: Well nourished, in no apparent distress. Eyes: PERRLA, EOMs, conjunctiva no  swelling or erythema Sinuses: No Frontal/maxillary tenderness ENT/Mouth: Ext aud canals clear, Tms fluid Right ear, no drainage or erythema. No erythema, swelling, or exudate on post pharynx.  Tonsils not swollen or erythematous. Hearing normal.  Neck: Supple, thyroid normal.  Respiratory: Respiratory effort normal, BS equal bilaterally without rales, rhonchi, wheezing or stridor.  Cardio: RRR with no MRGs. Brisk peripheral pulses.2+ pitting edema of LLE, 1+ nonpitting edema RLE Abdomen: Soft, + BS.  Non tender, no guarding, rebound, hernias, masses. Lymphatics: Non tender without lymphadenopathy.  Musculoskeletal: Full ROM, 5/5 strength, Normal gait Skin: Warm, dry . Red irregular border rash under breasts bilaterally  Neuro: Cranial nerves intact. No cerebellar symptoms.  Psych: Awake and oriented X 3, normal affect, Insight and Judgment appropriate.    Medicare Attestation I have personally reviewed: The patient's medical and social history Their use of alcohol, tobacco or illicit drugs Their current medications and supplements The patient's functional ability including ADLs,fall risks, home safety risks, cognitive, and hearing and visual impairment Diet and physical activities Evidence for depression or mood disorders  The patient's weight, height, BMI, and visual acuity have been recorded in the chart.  I have made referrals, counseling, and provided education to the patient based on review of the above and I have provided the patient with a written personalized care plan for preventive services.       Revonda Humphrey ANP-C  [facility]  12/24/2022

## 2022-12-25 ENCOUNTER — Ambulatory Visit: Payer: Medicare HMO | Admitting: Nurse Practitioner

## 2022-12-25 DIAGNOSIS — Z79899 Other long term (current) drug therapy: Secondary | ICD-10-CM

## 2022-12-25 DIAGNOSIS — Z0001 Encounter for general adult medical examination with abnormal findings: Secondary | ICD-10-CM

## 2022-12-25 DIAGNOSIS — R600 Localized edema: Secondary | ICD-10-CM

## 2022-12-25 DIAGNOSIS — I10 Essential (primary) hypertension: Secondary | ICD-10-CM

## 2022-12-25 DIAGNOSIS — E782 Mixed hyperlipidemia: Secondary | ICD-10-CM

## 2022-12-25 DIAGNOSIS — E559 Vitamin D deficiency, unspecified: Secondary | ICD-10-CM

## 2022-12-25 DIAGNOSIS — I7300 Raynaud's syndrome without gangrene: Secondary | ICD-10-CM

## 2022-12-25 DIAGNOSIS — R7309 Other abnormal glucose: Secondary | ICD-10-CM

## 2022-12-25 DIAGNOSIS — E2839 Other primary ovarian failure: Secondary | ICD-10-CM

## 2022-12-25 DIAGNOSIS — E039 Hypothyroidism, unspecified: Secondary | ICD-10-CM

## 2022-12-25 DIAGNOSIS — Z8673 Personal history of transient ischemic attack (TIA), and cerebral infarction without residual deficits: Secondary | ICD-10-CM

## 2022-12-25 DIAGNOSIS — K5790 Diverticulosis of intestine, part unspecified, without perforation or abscess without bleeding: Secondary | ICD-10-CM

## 2022-12-25 DIAGNOSIS — I493 Ventricular premature depolarization: Secondary | ICD-10-CM

## 2022-12-25 DIAGNOSIS — N1831 Chronic kidney disease, stage 3a: Secondary | ICD-10-CM

## 2022-12-28 ENCOUNTER — Telehealth: Payer: Self-pay | Admitting: Gynecologic Oncology

## 2022-12-28 ENCOUNTER — Inpatient Hospital Stay: Payer: Medicare HMO | Admitting: Gynecologic Oncology

## 2022-12-28 DIAGNOSIS — C541 Malignant neoplasm of endometrium: Secondary | ICD-10-CM

## 2022-12-28 NOTE — Telephone Encounter (Signed)
Spoke with the patient and she will call me back on Monday

## 2023-01-10 NOTE — Progress Notes (Signed)
MEDICARE ANNUAL WELLNESS VISIT AND FOLLOW UP   Assessment:     Encounter for Medicare annual wellness exam Yearly  Essential hypertension - continue medications, DASH diet, exercise and monitor at home. Call if greater than 130/80.  -check BP in the morning and at 9 oclock - taking benazepril  40 mg 1/2 tab QAM and bASA in the morning   Hyperlipidemia At goal without medications at this time;  Continue low cholesterol diet and exercise.  - lipid panel  Abnormal glucose Discussed disease and risks Discussed diet/exercise, weight management  Defer A1C- check CMP for glucose  CKD Stage III (GFR 56 ml/min) Increase fluids, avoid NSAIDS, monitor sugars, will monitor - CMP/GFR  Hypothyroidism, unspecified hypothyroidism type -check TSH level, continue medications the same, reminded to take on an empty stomach 30-56mins before food.    Diverticulosis of intestine without bleeding, unspecified intestinal tract location Controlled, diet   Raynaud phenomenon Declines meds at this time  Overweight Long discussion about weight loss, diet, and exercise Recommended diet heavy in fruits and veggies and low in animal meats, cheeses, and dairy products, appropriate calorie intake Discussed appropriate weight for height Follow up at next visit  History of TIA Control B/P, lipids, glucose bASA  Bone spur of left foot Encouraged to follow up with orthopedics Encouraged supportive shoes   Edema of lower extremities Encourage compression socks and elevating feet above heart level as much as possible Limit salt intake and push fluids  Estrogen Deficiency DEXA UTD  Vitamin D deficiency Continue supplementation to maintain goal of 70-100 Taking Vitamin D 2,000 IU daily Check vitamin D level  Unifocal PVCs  Monitor yearly, check labs  Medication management Continued    Further disposition pending results if labs check today. Discussed med's effects and SE's.   Over 30  minutes of face to face interview, exam, counseling, chart review, and critical decision making was performed.    Future Appointments  Date Time Provider Department Center  04/16/2023 11:00 AM Lucky Cowboy, MD [facility] None  01/14/2024 10:00 AM Raynelle Dick, NP [facility] None     Plan:   During the course of the visit the patient was educated and counseled about appropriate screening and preventive services including:   Pneumococcal vaccine  Influenza vaccine Td vaccine Screening electrocardiogram Screening mammography Bone densitometry screening Colorectal cancer screening Diabetes screening Glaucoma screening Nutrition counseling  Advanced directives: given info/requested   Subjective:   Mikayla Harrison is a 89 y.o. female who presents for Medicare Annual Wellness Visit and 3 month follow up on hypertension, prediabetes, hyperlipidemia, vitamin D def.   Mammogram 03/30/22 requested spot compression of Left breast, she did not have that test completed   Plantar fascitis and she has bone spurs on the left foot.  She has been to Foot specialist, Dr Loreta Ave. She has used Gabapentin but the medication makes her sick and can not use. Dr Dion Saucier has put shots in her knees for arthritis- was seen several days ago and shots in knees bilaterally. She is going to be seeing a physical medicine doctor for her bilateral hip pain.   She is in the process of getting dentures.  After that is completed she is considering  hearing aids.   She lives by herself, has a lot of anxiety at night, has clorazepate if needed, takes 1/2-1/4 tab occasionally.   BMI is Body mass index is 25.97 kg/m., she has not been working on diet and exercise. She does allot of house work  and some walking. She eats limited meat.  She is drinking more water.  Wt Readings from Last 3 Encounters:  01/14/23 142 lb (64.4 kg)  12/17/22 141 lb 6.4 oz (64.1 kg)  10/31/22 146 lb (66.2 kg)   Her blood pressure has been  controlled at home, currently on Benazepril 40 mg 1/2 tab daily, today their BP is BP: 124/60.  BP Readings from Last 3 Encounters:  01/14/23 124/60  12/17/22 110/60  10/31/22 92/60  She does not workout but stays active around the house. She denies chest pain, shortness of breath, dizziness.    She is not on cholesterol medication and denies myalgias. Her cholesterol is at goal. The cholesterol last visit was:   Lab Results  Component Value Date   CHOL 133 09/18/2022   HDL 46 (L) 09/18/2022   LDLCALC 69 09/18/2022   TRIG 99 09/18/2022   CHOLHDL 2.9 09/18/2022   She has CKD stage III due to HTN. Lab Results  Component Value Date   EGFR 58 (L) 09/18/2022    She has been working on diet and exercise for prediabetes, and denies paresthesia of the feet, polydipsia, polyuria and visual disturbances. She is on bASA.  Last A1C in the office was:  Lab Results  Component Value Date   HGBA1C 6.1 (H) 09/18/2022   Patient is on Vitamin D supplement. Lab Results  Component Value Date   VD25OH 103 (H) 09/18/2022     She is on thyroid medication. Synthroid 150 mcg every day  Her medication was not changed last visit.   Lab Results  Component Value Date   TSH 1.18 08/01/2022   Patient was held overnight for observation in May 2018 with a (-) Head CT, MRI & carotid dopplers and she was discharged home on LD bASA for suspected possible TIA.  Medication Review Current Outpatient Medications on File Prior to Visit  Medication Sig   Ascorbic Acid (VITAMIN C PO) Take 1,000 mg by mouth daily.   benazepril (LOTENSIN) 40 MG tablet Take 1 tablet by mouth once daily for blood pressure   Cholecalciferol (VITAMIN D3) 125 MCG (5000 UT) TABS Take 5,000 Units by mouth daily.   clorazepate (TRANXENE) 7.5 MG tablet Take 1/2 - 1 tablet 2 - 3 x /day ONLY if needed for Anxiety Attack &  limit to 5 days /week to avoid Addiction & Dementia (Patient taking differently: Take 1.875 mg by mouth daily as needed  for anxiety. Take 1/2 - 1 tablet 2 - 3 x /day ONLY if needed for Anxiety Attack &  limit to 5 days /week to avoid Addiction & Dementia)   clotrimazole (LOTRIMIN) 1 % cream Apply 1 application topically 2 (two) times daily.   Cyanocobalamin (VITAMIN B-12) 2500 MCG SUBL Take 2,500 mcg by mouth daily.   Magnesium 250 MG TABS Take 1 tablet by mouth daily.   meclizine (ANTIVERT) 25 MG tablet Take 1/2 to 1  3 x  /day as needed for Dizziness / Vertigo   SYNTHROID 150 MCG tablet Take  1 tablet  Daily  on an empty stomach with only water for 30 minutes & no Antacid meds, Calcium or Magnesium for 4 hours & avoid Biotin   zinc gluconate 50 MG tablet Take 50 mg by mouth daily.   No current facility-administered medications on file prior to visit.    Current Problems (verified) Patient Active Problem List   Diagnosis Date Noted   Endometrial cancer (HCC) 09/08/2021   Edema of left lower  extremity 09/08/2021   [place] pain 08/04/2020   Enrolled in chronic care management 11/14/2018   Normocytic anemia 06/10/2018   CREST syndrome (HCC) 02/12/2018   Abnormal glucose 10/10/2017   FHx: heart disease 10/10/2017   History of TIA (transient ischemic attack) 11/06/2016   Raynaud phenomenon 08/01/2015   Overweight (BMI 25.0-29.9) 03/22/2015   Unifocal PVCs 11/02/2014   CKD (chronic kidney disease) stage 3, GFR 30-59 ml/min (HCC) 03/16/2014   Hyperlipidemia, mixed 10/05/2013   Screening for colorectal cancer 10/05/2013   Vitamin D deficiency 10/05/2013   Prediabetes    Diverticulosis    Essential hypertension 01/14/2012   Hypothyroidism 01/14/2012    Screening Tests Immunization History  Administered Date(s) Administered   DT (Pediatric) 03/22/2015   Influenza Split 03/04/2013   Influenza, High Dose Seasonal PF 03/16/2014, 03/22/2015, 02/15/2016, 04/23/2017, 04/30/2018, 04/08/2019, 06/19/2021   Pneumococcal Polysaccharide-23 03/16/2014   Td 10/10/2017   Health Maintenance  Topic Date Due    COVID-19 Vaccine (1) 01/30/2023 (Originally 02/26/1937)   Pneumonia Vaccine 68+ Years old (2 of 2 - PCV) 04/16/2023 (Originally 03/17/2015)   INFLUENZA VACCINE  04/16/2023 (Originally 01/10/2023)   Zoster Vaccines- Shingrix (1 of 2) 04/16/2023 (Originally 02/25/1951)   Medicare Annual Wellness (AWV)  01/14/2024   DTaP/Tdap/Td (3 - Tdap) 10/11/2027   DEXA SCAN  Completed   HPV VACCINES  Aged Out     Names of Other Physician/Practitioners you currently use: 1. [facility]- here for primary care 2. Dr. Emily Filbert, eye doctor, 05/31/2022 3. ,Dr. Mayford Knife in process of getting dentures.   Patient Care Team: Lucky Cowboy, MD as PCP - General (Internal Medicine) Patton Salles, MD as Consulting Physician (Obstetrics and Gynecology)  Allergies Allergies  Allergen Reactions   Sulfa Antibiotics Other (See Comments)   Codeine Nausea Only   Prednisone Nausea And Vomiting, Nausea Only and Other (See Comments)   Asa [Aspirin] Anxiety and Other (See Comments)    Take 81mg  cannot take 325mg  gets nervous    SURGICAL HISTORY She  has a past surgical history that includes Cataract extraction; Vericose vein (1965); MACULAR TEAR; Eye surgery; Robotic assisted total hysterectomy with bilateral salpingo oophorectomy (N/A, 10/18/2021); Sentinel node biopsy (N/A, 10/18/2021); and Cystoscopy (10/18/2021). FAMILY HISTORY Her family history includes Cancer in her maternal aunt; Colon polyps in her sister; Diabetes in her sister; Heart disease in her brother, father, and mother; Hypertension in her son; Kidney cancer in her nephew; Stroke in her brother, mother, and sister. SOCIAL HISTORY She  reports that she quit smoking about 57 years ago. Her smoking use included cigarettes. She started smoking about 72 years ago. She has a 15 pack-year smoking history. She has never used smokeless tobacco. She reports that she does not drink alcohol and does not use  drugs.  MEDICARE WELLNESS OBJECTIVES: Physical activity: yard work and walking Depression/mood screen:      01/14/2023   10:24 AM  Depression screen PHQ 2/9  Decreased Interest 0  Down, Depressed, Hopeless 0  PHQ - 2 Score 0   ADLs:     01/14/2023   10:22 AM 03/03/2022    8:43 PM  In your present state of health, do you have any difficulty performing the following activities:  Hearing? 0 1  Comment  hearing aids  Vision? 0 0  Difficulty concentrating or making decisions? 0 0  Walking or climbing stairs? 1 0  Comment  cane / walker  Dressing or bathing? 0 0  Doing errands,  shopping? 0 0    Cognitive Testing  Alert? Yes  Normal Appearance?Yes  Oriented to person? Yes  Place? Yes   Time? Yes  Recall of three objects?  Yes  Can perform simple calculations? Yes  Displays appropriate judgment?Yes  Can read the correct time from a watch face?Yes  EOL planning: Does Patient Have a Medical Advance Directive?: No Would patient like information on creating a medical advance directive?: No - Patient declined No, declined information   Objective:   Blood pressure 124/60, pulse 70, temperature 97.7 F (36.5 C), height 5\' 2"  (1.575 m), weight 142 lb (64.4 kg), SpO2 99%. Body mass index is 25.97 kg/m.  General Appearance: Well nourished, in no apparent distress. Eyes: PERRLA, EOMs, conjunctiva no swelling or erythema Sinuses: No Frontal/maxillary tenderness ENT/Mouth: Ext aud canals clear, Tms no bulging or erythema.No erythema, swelling, or exudate on post pharynx.   Hard of hearing Neck: Supple, thyroid normal.  Respiratory: Respiratory effort normal, BS equal bilaterally without rales, rhonchi, wheezing or stridor.  Cardio: RRR with no MRGs. Brisk peripheral pulses.2+ pitting edema of LLE, 1+ nonpitting edema RLE Abdomen: Soft, + BS.  Non tender, no guarding, rebound, hernias, masses. Lymphatics: Non tender without lymphadenopathy.  Musculoskeletal: Full ROM, 5/5 strength,  Normal gait Skin: Warm, dry . Red irregular border rash under breasts bilaterally  Neuro: Cranial nerves intact. No cerebellar symptoms.  Psych: Awake and oriented X 3, normal affect, Insight and Judgment appropriate.    Medicare Attestation I have personally reviewed: The patient's medical and social history Their use of alcohol, tobacco or illicit drugs Their current medications and supplements The patient's functional ability including ADLs,fall risks, home safety risks, cognitive, and hearing and visual impairment Diet and physical activities Evidence for depression or mood disorders  The patient's weight, height, BMI, and visual acuity have been recorded in the chart.  I have made referrals, counseling, and provided education to the patient based on review of the above and I have provided the patient with a written personalized care plan for preventive services.       Revonda Humphrey ANP-C  [facility]  01/14/2023

## 2023-01-11 DIAGNOSIS — M170 Bilateral primary osteoarthritis of knee: Secondary | ICD-10-CM | POA: Diagnosis not present

## 2023-01-11 DIAGNOSIS — M545 Low back pain, unspecified: Secondary | ICD-10-CM | POA: Diagnosis not present

## 2023-01-14 ENCOUNTER — Ambulatory Visit (INDEPENDENT_AMBULATORY_CARE_PROVIDER_SITE_OTHER): Payer: Medicare HMO | Admitting: Nurse Practitioner

## 2023-01-14 ENCOUNTER — Encounter: Payer: Self-pay | Admitting: Nurse Practitioner

## 2023-01-14 VITALS — BP 124/60 | HR 70 | Temp 97.7°F | Ht 62.0 in | Wt 142.0 lb

## 2023-01-14 DIAGNOSIS — E663 Overweight: Secondary | ICD-10-CM

## 2023-01-14 DIAGNOSIS — M7752 Other enthesopathy of left foot: Secondary | ICD-10-CM

## 2023-01-14 DIAGNOSIS — Z0001 Encounter for general adult medical examination with abnormal findings: Secondary | ICD-10-CM

## 2023-01-14 DIAGNOSIS — R7309 Other abnormal glucose: Secondary | ICD-10-CM | POA: Diagnosis not present

## 2023-01-14 DIAGNOSIS — E559 Vitamin D deficiency, unspecified: Secondary | ICD-10-CM | POA: Diagnosis not present

## 2023-01-14 DIAGNOSIS — Z79899 Other long term (current) drug therapy: Secondary | ICD-10-CM

## 2023-01-14 DIAGNOSIS — N1831 Chronic kidney disease, stage 3a: Secondary | ICD-10-CM

## 2023-01-14 DIAGNOSIS — I872 Venous insufficiency (chronic) (peripheral): Secondary | ICD-10-CM

## 2023-01-14 DIAGNOSIS — K5790 Diverticulosis of intestine, part unspecified, without perforation or abscess without bleeding: Secondary | ICD-10-CM

## 2023-01-14 DIAGNOSIS — R6889 Other general symptoms and signs: Secondary | ICD-10-CM | POA: Diagnosis not present

## 2023-01-14 DIAGNOSIS — E039 Hypothyroidism, unspecified: Secondary | ICD-10-CM | POA: Diagnosis not present

## 2023-01-14 DIAGNOSIS — E782 Mixed hyperlipidemia: Secondary | ICD-10-CM | POA: Diagnosis not present

## 2023-01-14 DIAGNOSIS — I7300 Raynaud's syndrome without gangrene: Secondary | ICD-10-CM | POA: Diagnosis not present

## 2023-01-14 DIAGNOSIS — Z8673 Personal history of transient ischemic attack (TIA), and cerebral infarction without residual deficits: Secondary | ICD-10-CM

## 2023-01-14 DIAGNOSIS — I10 Essential (primary) hypertension: Secondary | ICD-10-CM

## 2023-01-14 DIAGNOSIS — E2839 Other primary ovarian failure: Secondary | ICD-10-CM

## 2023-01-14 DIAGNOSIS — I493 Ventricular premature depolarization: Secondary | ICD-10-CM

## 2023-01-14 NOTE — Patient Instructions (Signed)
Physiatrist - physical medicine doctor  GENERAL HEALTH GOALS   Know what a healthy weight is for you (roughly BMI <25) and aim to maintain this   Aim for 7+ servings of fruits and vegetables daily   70-80+ fluid ounces of water or unsweet tea for healthy kidneys   Limit to max 1 drink of alcohol per day; avoid smoking/tobacco   Limit animal fats in diet for cholesterol and heart health - choose grass fed whenever available   Avoid highly processed foods, and foods high in saturated/trans fats   Aim for low stress - take time to unwind and care for your mental health   Aim for 150 min of moderate intensity exercise weekly for heart health, and weights twice weekly for bone health   Aim for 7-9 hours of sleep daily

## 2023-01-15 LAB — LIPID PANEL
Cholesterol: 131 mg/dL (ref <200)
HDL: 43 mg/dL — ABNORMAL LOW (ref >=50)
LDL Cholesterol (Calc): 71 mg/dL
Non-HDL Cholesterol (Calc): 88 mg/dL (ref <130)
Total CHOL/HDL Ratio: 3 (calc) (ref <5.0)
Triglycerides: 87 mg/dL (ref <150)

## 2023-01-15 LAB — CBC WITH DIFFERENTIAL/PLATELET
Absolute Monocytes: 608 {cells}/uL (ref 200–950)
Basophils Absolute: 71 {cells}/uL (ref 0–200)
Basophils Relative: 0.9 %
Eosinophils Absolute: 403 {cells}/uL (ref 15–500)
Eosinophils Relative: 5.1 %
HCT: 32.3 % — ABNORMAL LOW (ref 35.0–45.0)
Hemoglobin: 10.4 g/dL — ABNORMAL LOW (ref 11.7–15.5)
Lymphs Abs: 1564 {cells}/uL (ref 850–3900)
MCH: 28 pg (ref 27.0–33.0)
MCHC: 32.2 g/dL (ref 32.0–36.0)
MCV: 86.8 fL (ref 80.0–100.0)
MPV: 12.4 fL (ref 7.5–12.5)
Monocytes Relative: 7.7 %
Neutro Abs: 5254 {cells}/uL (ref 1500–7800)
Neutrophils Relative %: 66.5 %
Platelets: 225 10*3/uL (ref 140–400)
RBC: 3.72 10*6/uL — ABNORMAL LOW (ref 3.80–5.10)
RDW: 13.7 % (ref 11.0–15.0)
Total Lymphocyte: 19.8 %
WBC: 7.9 10*3/uL (ref 3.8–10.8)

## 2023-01-15 LAB — COMPLETE METABOLIC PANEL WITHOUT GFR
AG Ratio: 1.4 (calc) (ref 1.0–2.5)
ALT: 11 U/L (ref 6–29)
AST: 19 U/L (ref 10–35)
Albumin: 3.9 g/dL (ref 3.6–5.1)
Alkaline phosphatase (APISO): 75 U/L (ref 37–153)
BUN/Creatinine Ratio: 24 (calc) — ABNORMAL HIGH (ref 6–22)
BUN: 26 mg/dL — ABNORMAL HIGH (ref 7–25)
CO2: 28 mmol/L (ref 20–32)
Calcium: 9.1 mg/dL (ref 8.6–10.4)
Chloride: 105 mmol/L (ref 98–110)
Creat: 1.1 mg/dL — ABNORMAL HIGH (ref 0.60–0.95)
Globulin: 2.7 g/dL (ref 1.9–3.7)
Glucose, Bld: 72 mg/dL (ref 65–99)
Potassium: 4.1 mmol/L (ref 3.5–5.3)
Sodium: 138 mmol/L (ref 135–146)
Total Bilirubin: 0.3 mg/dL (ref 0.2–1.2)
Total Protein: 6.6 g/dL (ref 6.1–8.1)
eGFR: 48 mL/min/{1.73_m2} — ABNORMAL LOW (ref >=60)

## 2023-01-15 LAB — TSH: TSH: 1.26 m[IU]/L (ref 0.40–4.50)

## 2023-01-26 IMAGING — CT CT CHEST-ABD-PELV W/ CM
2 of 5 series · 13 of 36 positions shown, 15 images · IV contrast (OMNIPAQUE)
Comparison: CT the abdomen and pelvis 08/25/2013. No prior chest
CT.

CLINICAL DATA: 89-year-old female with history of clear cell
endometrial carcinoma. Evaluate for metastatic disease.

* Tracking Code: BO *
EXAM:
CT CHEST, ABDOMEN, AND PELVIS WITH CONTRAST
TECHNIQUE: Multidetector CT imaging of the chest, abdomen and pelvis was
performed following the standard protocol during bolus
administration of intravenous contrast.

[Series 2: cap with · axial · 0.80mm/px · z∈[-528,-18]mm · 10 of 126 slices shown, 12 images]
[im 12/126  mediastinal]
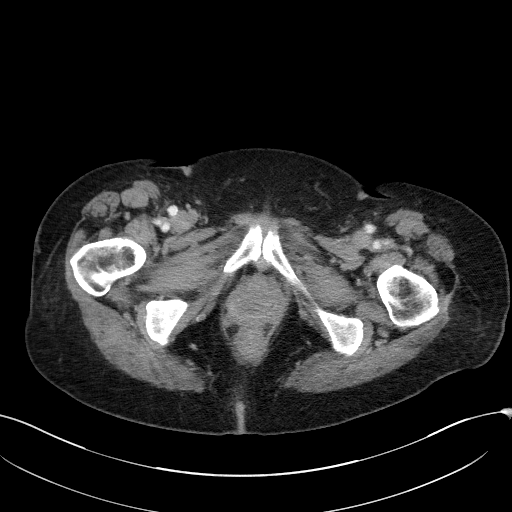
[im 12/126  bone]
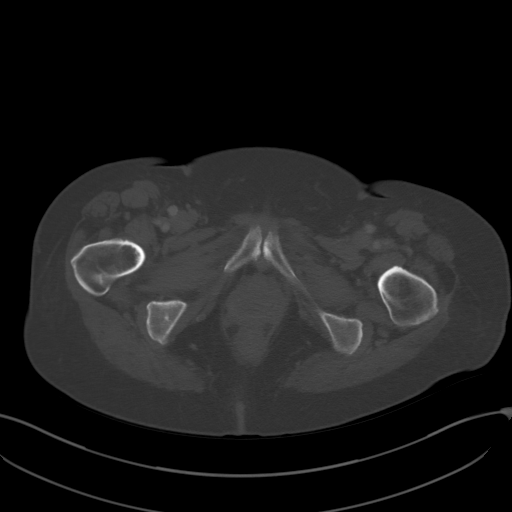
[im 23/126  mediastinal]
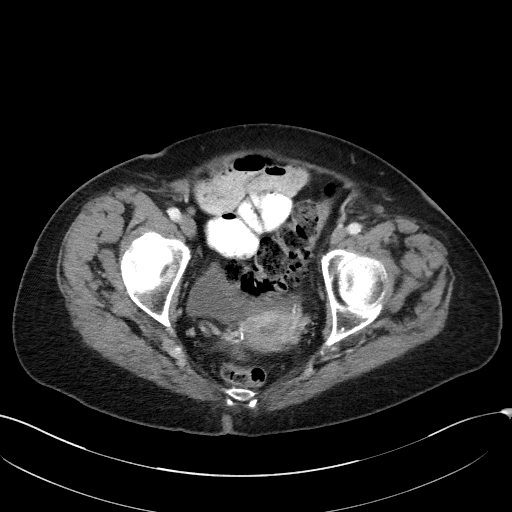
[im 35/126  mediastinal]
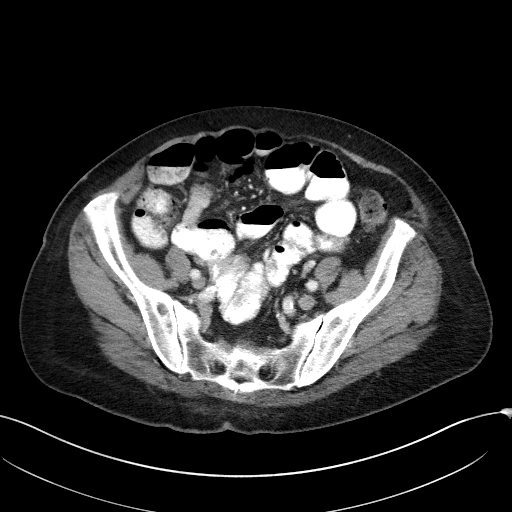
[im 46/126  mediastinal]
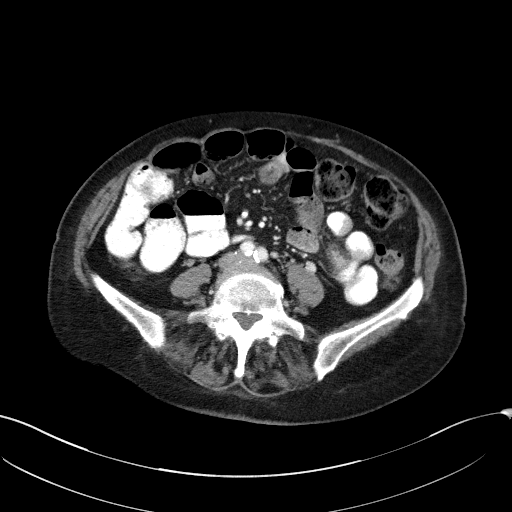
[im 57/126  mediastinal]
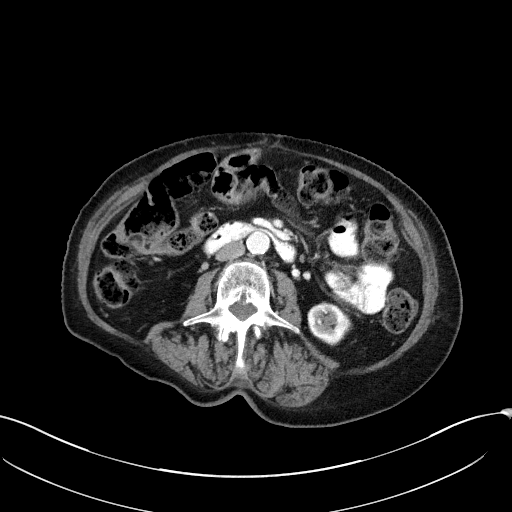
[im 69/126  mediastinal]
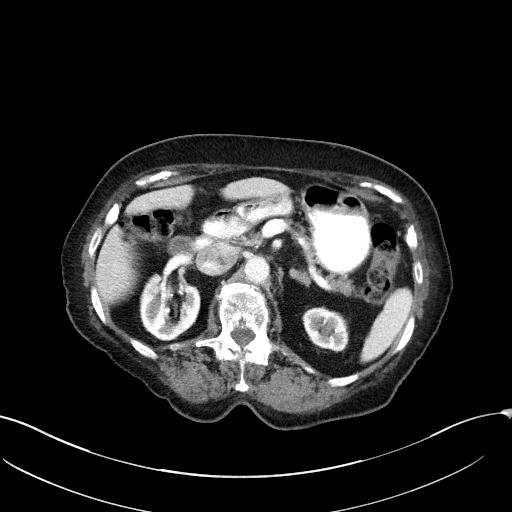
[im 80/126  mediastinal]
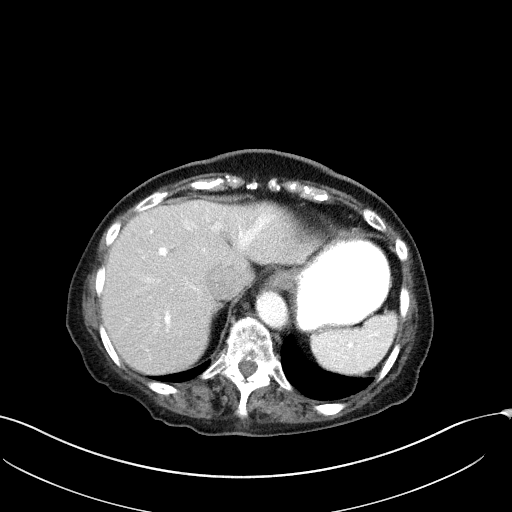
[im 91/126  mediastinal]
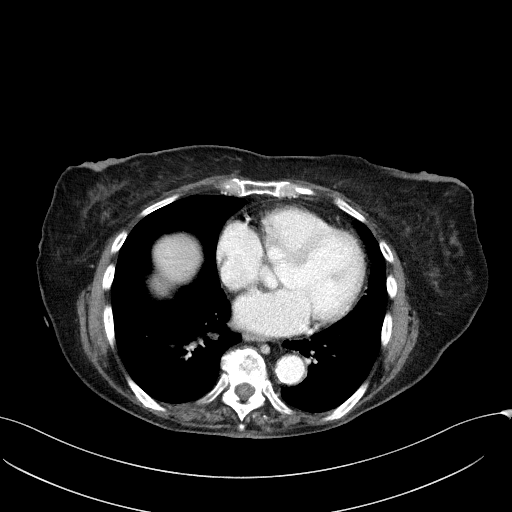
[im 103/126  mediastinal]
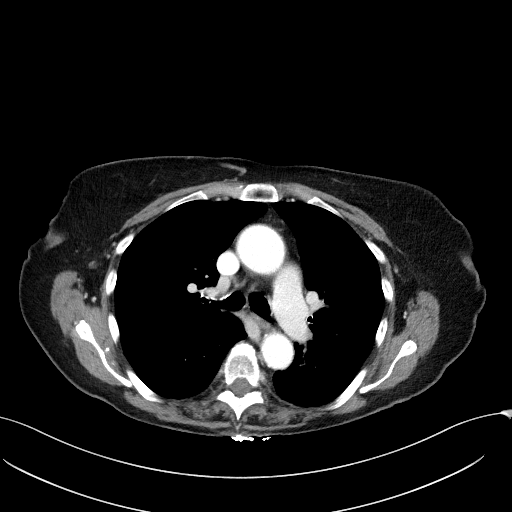
[im 103/126  bone]
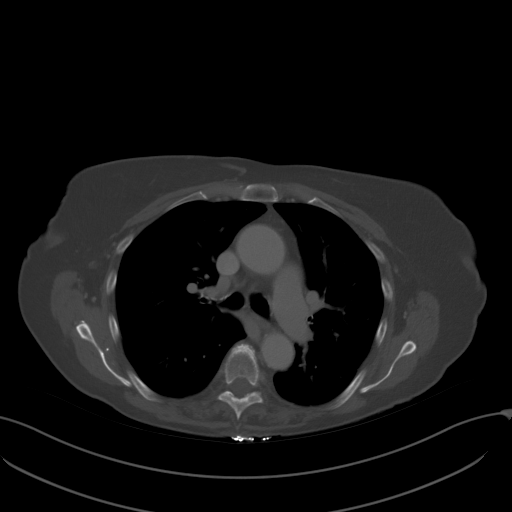
[im 114/126  mediastinal]
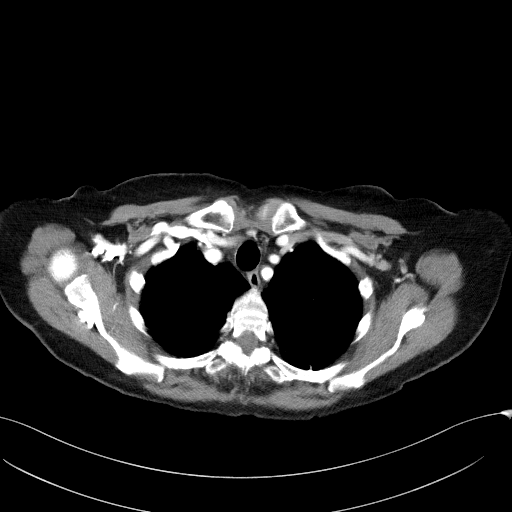

[Series 4: coronals · coronal · 0.74mm/px · 3 of 142 slices shown]
[im 29/142  mediastinal]
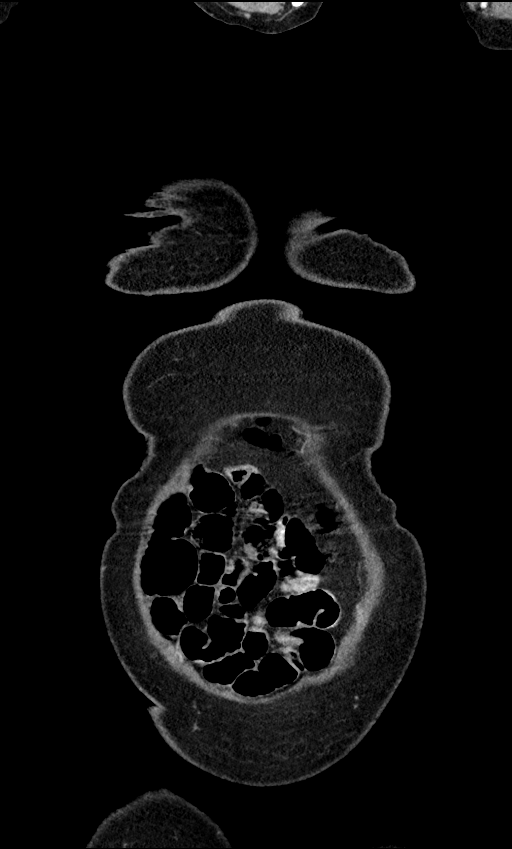
[im 57/142  mediastinal]
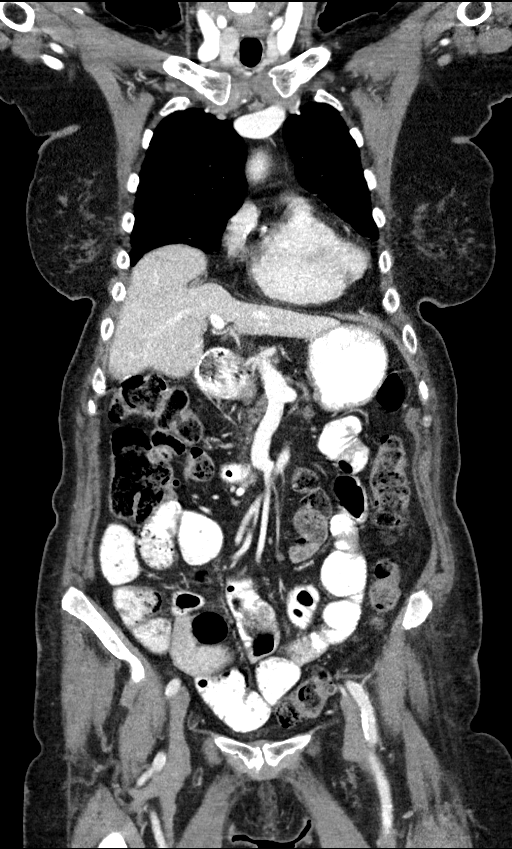
[im 85/142  mediastinal]
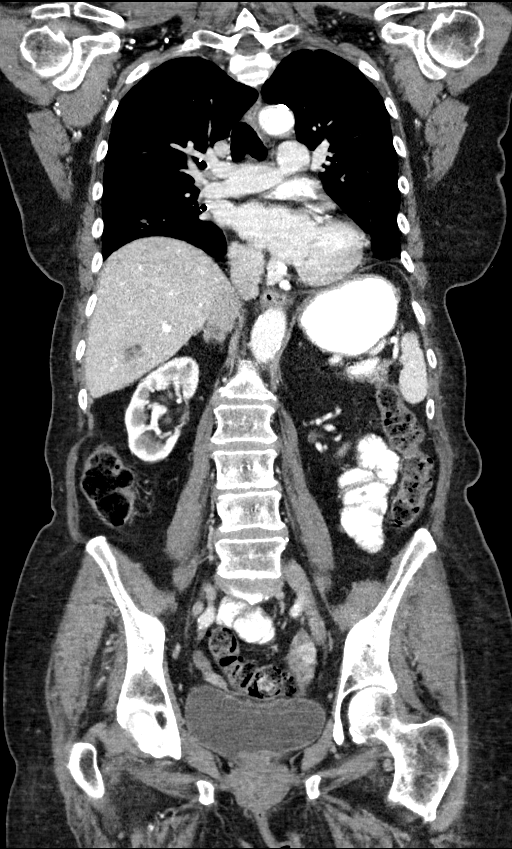

[13 of 36 positions shown; findings below may reference images not displayed]

RADIATION DOSE REDUCTION: This exam was performed according to the
departmental dose-optimization program which includes automated
exposure control, adjustment of the mA and/or kV according to
patient size and/or use of iterative reconstruction technique.

CONTRAST:  100mL OMNIPAQUE IOHEXOL 300 MG/ML  SOLN
FINDINGS: CT CHEST FINDINGS

Cardiovascular: Heart size is normal. There is no significant
pericardial fluid, thickening or pericardial calcification. There is
aortic atherosclerosis, as well as atherosclerosis of the great
vessels of the mediastinum and the coronary arteries, including
calcified atherosclerotic plaque in the left anterior descending and
left circumflex coronary arteries.

Mediastinum/Nodes: No pathologically enlarged hilar lymph nodes.
Esophagus is unremarkable in appearance. No axillary
lymphadenopathy. Mediastinal or

Lungs/Pleura: No aggressive appearing pulmonary nodules or masses
are noted. No acute consolidative airspace disease. No pleural
effusions.

Musculoskeletal: There are no aggressive appearing lytic or blastic
lesions noted in the visualized portions of the skeleton.

CT ABDOMEN PELVIS FINDINGS

Hepatobiliary: In the right lobe of the liver predominantly in
segment 6 there is a well-defined 2.2 x 1.7 cm low-attenuation
lesion compatible with a simple cyst. No other aggressive appearing
hepatic lesions. No intra or extrahepatic biliary ductal dilatation.
Gallbladder is normal in appearance.

Pancreas: No pancreatic mass. Atrophy in the pancreas. No pancreatic
ductal dilatation. No pancreatic or peripancreatic fluid collections
or inflammatory changes.

Spleen: Unremarkable.

Adrenals/Urinary Tract: Bilateral kidneys and the right adrenal
gland are normal in appearance. 1.4 x 1.2 cm left adrenal nodule,
unchanged, incompletely characterized on today's examination, but
presumably a benign lesions such as an adenoma. No
hydroureteronephrosis. Urinary bladder is normal in appearance.

Stomach/Bowel: Normal appearance of the stomach. No pathologic
dilatation of small bowel or colon. Normal appendix.

Vascular/Lymphatic: Aortic atherosclerosis, without evidence of
aneurysm or dissection in the abdominal or pelvic vasculature. No
lymphadenopathy noted in the abdomen or pelvis.

Reproductive: There is a low-attenuation heterogeneously enhancing
mass-like area in the left side of the uterine body and fundus
(axial image 102 of series 2 and sagittal image 104 of series 5)
measuring 1.7 x 1.9 x 3.5 cm. Ovaries are unremarkable in
appearance.

Other: No significant volume of ascites.  No pneumoperitoneum.

Musculoskeletal: There are no aggressive appearing lytic or blastic
lesions noted in the visualized portions of the skeleton.
IMPRESSION: 1. Heterogeneously enhancing mass-like area in the left side of the
uterine body and fundus, which presumably corresponds to the
patient's reported clear cell endometrial carcinoma, as above. No
definitive findings to suggest metastatic disease to the chest,
abdomen or pelvis.
2. Small left adrenal nodule, stable compared to prior study from
5484, considered benign (likely an adenoma).
3. Aortic atherosclerosis, in addition to 2 vessel coronary artery
disease.
4. Additional incidental findings, as above.

## 2023-03-06 ENCOUNTER — Encounter: Payer: Medicare HMO | Admitting: Internal Medicine

## 2023-04-16 ENCOUNTER — Encounter: Payer: Self-pay | Admitting: Internal Medicine

## 2023-04-16 ENCOUNTER — Ambulatory Visit (INDEPENDENT_AMBULATORY_CARE_PROVIDER_SITE_OTHER): Payer: Medicare HMO | Admitting: Internal Medicine

## 2023-04-16 VITALS — BP 118/62 | HR 78 | Temp 97.9°F | Resp 16 | Ht 62.0 in | Wt 138.2 lb

## 2023-04-16 DIAGNOSIS — E782 Mixed hyperlipidemia: Secondary | ICD-10-CM

## 2023-04-16 DIAGNOSIS — Z79899 Other long term (current) drug therapy: Secondary | ICD-10-CM

## 2023-04-16 DIAGNOSIS — E039 Hypothyroidism, unspecified: Secondary | ICD-10-CM | POA: Diagnosis not present

## 2023-04-16 DIAGNOSIS — N1832 Chronic kidney disease, stage 3b: Secondary | ICD-10-CM

## 2023-04-16 DIAGNOSIS — Z87891 Personal history of nicotine dependence: Secondary | ICD-10-CM

## 2023-04-16 DIAGNOSIS — Z0001 Encounter for general adult medical examination with abnormal findings: Secondary | ICD-10-CM

## 2023-04-16 DIAGNOSIS — Z0000 Encounter for general adult medical examination without abnormal findings: Secondary | ICD-10-CM | POA: Diagnosis not present

## 2023-04-16 DIAGNOSIS — Z8249 Family history of ischemic heart disease and other diseases of the circulatory system: Secondary | ICD-10-CM

## 2023-04-16 DIAGNOSIS — R7309 Other abnormal glucose: Secondary | ICD-10-CM

## 2023-04-16 DIAGNOSIS — I10 Essential (primary) hypertension: Secondary | ICD-10-CM | POA: Diagnosis not present

## 2023-04-16 DIAGNOSIS — Z1211 Encounter for screening for malignant neoplasm of colon: Secondary | ICD-10-CM

## 2023-04-16 DIAGNOSIS — Z136 Encounter for screening for cardiovascular disorders: Secondary | ICD-10-CM | POA: Diagnosis not present

## 2023-04-16 DIAGNOSIS — E559 Vitamin D deficiency, unspecified: Secondary | ICD-10-CM | POA: Diagnosis not present

## 2023-04-16 NOTE — Patient Instructions (Signed)

## 2023-04-16 NOTE — Progress Notes (Signed)
 Annual Screening/Preventative Visit & Comprehensive Evaluation &  Examination   Future Appointments  Date Time Provider Department  01/14/2024 10:00 AM Raynelle Dick, NP [facility]  04/29/2024                  cpe 11:00 AM Lucky Cowboy, MD [facility]        This very nice 89 y.o.  WWF  with HTN, HLD, Prediabetes  and Vitamin D Deficiency  presents for a Screening /Preventative Visit & comprehensive evaluation and management of multiple medical co-morbidities.           HTN predates circa 1986. Patient's BP has been controlled at home.  Patient has CKD3b (GFR 41) consequent of her AS & HTCVD.  Patient denies any cardiac symptoms as chest pain, palpitations, shortness of breath, dizziness or ankle swelling. Today's BP is  at goal  - 118/62 .       Patient's hyperlipidemia is controlled with diet and medications. Patient denies myalgias or other medication SE's. Last lipids were at goal;  Lab Results  Component Value Date   CHOL 131 01/14/2023   HDL 43 (L) 01/14/2023   LDLCALC 71 01/14/2023   TRIG 87 01/14/2023   CHOLHDL 3.0 01/14/2023         Patient has hx/o prediabetes  (A1c 5.7% /2014 and 5.8% /2018) and patient denies reactive hypoglycemic symptoms, visual blurring, diabetic polys or paresthesias. Last A1c was still not at goal :  Lab Results  Component Value Date   HGBA1C 6.1 (H) 09/18/2022         Finally, patient has history of Vitamin D Deficiency ("36" /2009 & "29" /2016)  and last Vitamin D was borderline elevated :  Lab Results  Component Value Date   VD25OH 103 (H) 09/18/2022       Current Outpatient Medications on File Prior to Visit  Medication Sig   acetaminophen 325 MG tablet Take  every 6 hours as needed.   amLODipine  10 MG tablet TAKE 1/2 TO 1 DAILY    VITAMIN C  Take 1 tablet  daily.   aspirin 81 MG tablet Take  daily.   atenolol  50 MG tablet Take 1 tablet once daily for blood pressure   benazepril 40 MG tablet Take  1 tablet  Daily   for BP   VITAMIN D 1000 units  Take 1,000 Units  2 times daily.   clorazepate 7.5 MG tablet Take 1/2 TO 1  tab 2 -3 x /day as needed    gabapentin 100 MG capsule Take 1 capsule 3 times daily.   Magnesium 500 MG TABS Take 2 tablets by   daily.   meclizine 25 MG tablet TAKE 1/2 TO 1 TAB  3 x /da as needed   SYNTHROID 150 MCG tablet Take  1 tablet  Daily     valACYclovir 1000 MG tablet Take 1 tablet 3 times daily. Take for 7 days   vitamin B-12  500 MCG tablet Take  daily.   zinc 50 MG tablet  Takes 1/2 tablet daily.     Allergies  Allergen Reactions   Sulfa Antibiotics Other (See Comments)   Asa [Aspirin] Other (See Comments)    cannot take 325mg  gets nervous   Codeine Nausea Only   Prednisone Nausea And Vomiting     Past Medical History:  Diagnosis Date   CKD stage 3, GFR 30-59 ml/min (HCC)    Colon polyp    Diverticulosis  Hypertension    Iron deficiency    Prediabetes    Prediabetes    Small bowel obstruction [facility])      Health Maintenance  Topic Date Due   COVID-19 Vaccine (1) Never done   Zoster Vaccines- Shingrix (1 of 2) Never done   INFLUENZA VACCINE  01/09/2021   TETANUS/TDAP  10/11/2027   DEXA SCAN  Completed   PNA vac Low Risk Adult  Completed   HPV VACCINES  Aged Out     Immunization History  Administered Date(s) Administered   DT  03/22/2015   Influenza Split 03/04/2013   Influenza, High Dose  04/23/2017, 04/30/2018, 04/08/2019   Pneumococcal - 23 03/16/2014   Td 10/10/2017     Last Colon - 01/09/2011 - Dr Marina Goodell - Tubovillous adenoma polyp & was recommend 3-6 mo f/u repeat Colonoscopy which patient  has repeatedly refused .   Last MGM - 01/25/2010- patient refuses to schedule f/u MGM    Past Surgical History:  Procedure Laterality Date   CATARACT EXTRACTION     EYE SURGERY     MACULAR TEAR     Vericose vein  1960     Family History  Problem Relation Age of Onset   Heart disease Mother    Stroke Mother    Heart disease Father     Colon polyps Sister    Diabetes Sister    Stroke Sister    Heart disease Brother    Stroke Brother    Hypertension Son      Social History   Tobacco Use   Smoking status: Former    Packs/day: 1.00    Years: 15.00    Pack years: 15.00    Types: Cigarettes    Quit date: 06/11/1965    Years since quitting: 55.7   Smokeless tobacco: Never  Vaping Use   Vaping Use: Never used  Substance Use Topics   Alcohol use: No   Drug use: No      ROS Constitutional: Denies fever, chills, weight loss/gain, headaches, insomnia,  night sweats, and change in appetite. Does c/o fatigue. Eyes: Denies redness, blurred vision, diplopia, discharge, itchy, watery eyes.  ENT: Denies discharge, congestion, post nasal drip, epistaxis, sore throat, earache, hearing loss, dental pain, Tinnitus, Vertigo, Sinus pain, snoring.  Cardio: Denies chest pain, palpitations, irregular heartbeat, syncope, dyspnea, diaphoresis, orthopnea, PND, claudication, edema Respiratory: denies cough, dyspnea, DOE, pleurisy, hoarseness, laryngitis, wheezing.  Gastrointestinal: Denies dysphagia, heartburn, reflux, water brash, pain, cramps, nausea, vomiting, bloating, diarrhea, constipation, hematemesis, melena, hematochezia, jaundice, hemorrhoids Genitourinary: Denies dysuria, frequency, urgency, nocturia, hesitancy, discharge, hematuria, flank pain Breast: Breast lumps, nipple discharge, bleeding.  Musculoskeletal: Denies arthralgia, myalgia, stiffness, Jt. Swelling, pain, limp, and strain/sprain. Denies falls. Skin: Denies puritis, rash, hives, warts, acne, eczema, changing in skin lesion Neuro: No weakness, tremor, incoordination, spasms, paresthesia, pain Psychiatric: Denies confusion, memory loss, sensory loss. Denies Depression. Endocrine: Denies change in weight, skin, hair change, nocturia, and paresthesia, diabetic polys, visual blurring, hyper / hypo glycemic episodes.  Heme/Lymph: No excessive bleeding, bruising,  enlarged lymph nodes.  Physical Exam  BP 118/62   Pulse 78   Temp 97.9 F (36.6 C)   Resp 16   Ht 5\' 2"  (1.575 m)   Wt 138 lb 3.2 oz (62.7 kg)   SpO2 99%   BMI 25.28 kg/m   General Appearance: Well nourished  and in no apparent distress.  Eyes: PERRLA, EOMs, conjunctiva no swelling or erythema, normal fundi and vessels. Sinuses: No frontal/maxillary tenderness  ENT/Mouth: EACs patent / TMs  nl. Nares clear without erythema, swelling, mucoid exudates. Oral hygiene is good. No erythema, swelling, or exudate. Tongue normal, non-obstructing. Tonsils not swollen or erythematous. Hearing normal.  Neck: Supple, thyroid not palpable. No bruits, nodes or JVD. Respiratory: Respiratory effort normal.  BS equal and clear bilateral without rales, rhonci, wheezing or stridor. Cardio: Heart sounds are normal with regular rate and rhythm and no murmurs, rubs or gallops. Peripheral pulses are normal and equal bilaterally without edema. No aortic or femoral bruits. Chest: symmetric with normal excursions and percussion. Breasts: No palpable masses. Abdomen: Flat, soft with bowel sounds active. Nontender, no guarding, rebound, hernias, masses, or organomegaly.  Lymphatics: Non tender without lymphadenopathy.  Musculoskeletal: Full ROM all peripheral extremities, joint stability, 5/5 strength, and normal gait. Skin: Warm and dry without rashes, lesions, cyanosis, clubbing or  ecchymosis.  Neuro: Cranial nerves intact, reflexes equal bilaterally. Normal muscle tone, no cerebellar symptoms. Sensation intact.  Pysch: Alert and oriented X 3, normal affect, Insight and Judgment appropriate.    Assessment and Plan  1. Annual Preventative Screening Examination   2. Essential hypertension  - EKG 12-Lead - Urinalysis, Routine w reflex microscopic - Microalbumin / creatinine urine ratio - CBC with Differential/Platelet - COMPLETE METABOLIC PANEL WITH GFR - Magnesium - TSH   3. Hyperlipidemia,  mixed  - EKG 12-Lead - Lipid panel - TSH   4. Abnormal glucose  - EKG 12-Lead - Hemoglobin A1c - Insulin, random   5. Vitamin D deficiency  - VITAMIN D 25 Hydroxy    6. Stage 3b chronic kidney disease (HCC)   7. Hypothyroidism  - Urinalysis, Routine w reflex microscopic - Microalbumin / creatinine urine ratio - COMPLETE METABOLIC PANEL WITH GFR   8. Screening for colorectal cancer  - POC Hemoccult Bld/Stl    9. Screening for heart disease  - EKG 12-Lead   10. FHx: heart disease  - EKG 12-Lead   11. Former smoker  - EKG 12-Lead   12. Medication management  - EKG 12-Lead - Urinalysis, Routine w reflex microscopic - CBC with Differential/Platelet - COMPLETE METABOLIC PANEL WITH GFR - Magnesium - Lipid panel - TSH - Hemoglobin A1c - Insulin, random - VITAMIN D 25 Hydroxy            Patient was counseled in prudent diet to achieve/maintain BMI less than 25 for weight control, BP monitoring, regular exercise and medications. Discussed med's effects and SE's. Screening labs and tests as requested with regular follow-up as recommended. Over 40 minutes of exam, counseling, chart review and high complex critical decision making was performed.   Marinus Maw, MD

## 2023-04-17 ENCOUNTER — Other Ambulatory Visit: Payer: Self-pay | Admitting: Internal Medicine

## 2023-04-17 DIAGNOSIS — N3000 Acute cystitis without hematuria: Secondary | ICD-10-CM

## 2023-04-17 LAB — URINALYSIS, ROUTINE W REFLEX MICROSCOPIC
Bilirubin Urine: NEGATIVE
Glucose, UA: NEGATIVE
Hgb urine dipstick: NEGATIVE
Hyaline Cast: NONE SEEN /[LPF]
Ketones, ur: NEGATIVE
Nitrite: POSITIVE — AB
Protein, ur: NEGATIVE
RBC / HPF: NONE SEEN /[HPF] (ref 0–2)
Specific Gravity, Urine: 1.01 (ref 1.001–1.035)
WBC, UA: >=60 /[HPF] — AB (ref 0–5)
pH: 5.5 (ref 5.0–8.0)

## 2023-04-17 LAB — CBC WITH DIFFERENTIAL/PLATELET
Absolute Lymphocytes: 1435 {cells}/uL (ref 850–3900)
Absolute Monocytes: 710 {cells}/uL (ref 200–950)
Basophils Absolute: 47 {cells}/uL (ref 0–200)
Basophils Relative: 0.6 %
Eosinophils Absolute: 413 {cells}/uL (ref 15–500)
Eosinophils Relative: 5.3 %
HCT: 31.5 % — ABNORMAL LOW (ref 35.0–45.0)
Hemoglobin: 10.2 g/dL — ABNORMAL LOW (ref 11.7–15.5)
MCH: 28.6 pg (ref 27.0–33.0)
MCHC: 32.4 g/dL (ref 32.0–36.0)
MCV: 88.2 fL (ref 80.0–100.0)
MPV: 11.2 fL (ref 7.5–12.5)
Monocytes Relative: 9.1 %
Neutro Abs: 5195 {cells}/uL (ref 1500–7800)
Neutrophils Relative %: 66.6 %
Platelets: 274 10*3/uL (ref 140–400)
RBC: 3.57 10*6/uL — ABNORMAL LOW (ref 3.80–5.10)
RDW: 12.7 % (ref 11.0–15.0)
Total Lymphocyte: 18.4 %
WBC: 7.8 10*3/uL (ref 3.8–10.8)

## 2023-04-17 LAB — MICROALBUMIN / CREATININE URINE RATIO
Creatinine, Urine: 52 mg/dL (ref 20–275)
Microalb Creat Ratio: 12 mg/g{creat} (ref <30)
Microalb, Ur: 0.6 mg/dL

## 2023-04-17 LAB — LIPID PANEL
Cholesterol: 137 mg/dL (ref <200)
HDL: 51 mg/dL (ref >=50)
LDL Cholesterol (Calc): 69 mg/dL
Non-HDL Cholesterol (Calc): 86 mg/dL (ref <130)
Total CHOL/HDL Ratio: 2.7 (calc) (ref <5.0)
Triglycerides: 85 mg/dL (ref <150)

## 2023-04-17 LAB — COMPLETE METABOLIC PANEL WITH GFR
AG Ratio: 1.3 (calc) (ref 1.0–2.5)
ALT: 13 U/L (ref 6–29)
AST: 21 U/L (ref 10–35)
Albumin: 3.9 g/dL (ref 3.6–5.1)
Alkaline phosphatase (APISO): 77 U/L (ref 37–153)
BUN/Creatinine Ratio: 21 (calc) (ref 6–22)
BUN: 21 mg/dL (ref 7–25)
CO2: 27 mmol/L (ref 20–32)
Calcium: 9.1 mg/dL (ref 8.6–10.4)
Chloride: 100 mmol/L (ref 98–110)
Creat: 0.98 mg/dL — ABNORMAL HIGH (ref 0.60–0.95)
Globulin: 2.9 g/dL (ref 1.9–3.7)
Glucose, Bld: 80 mg/dL (ref 65–99)
Potassium: 4.5 mmol/L (ref 3.5–5.3)
Sodium: 135 mmol/L (ref 135–146)
Total Bilirubin: 0.3 mg/dL (ref 0.2–1.2)
Total Protein: 6.8 g/dL (ref 6.1–8.1)
eGFR: 55 mL/min/{1.73_m2} — ABNORMAL LOW (ref >=60)

## 2023-04-17 LAB — HEMOGLOBIN A1C
Hgb A1c MFr Bld: 6 %{Hb} — ABNORMAL HIGH (ref <5.7)
Mean Plasma Glucose: 126 mg/dL
eAG (mmol/L): 7 mmol/L

## 2023-04-17 LAB — MICROSCOPIC MESSAGE

## 2023-04-17 LAB — INSULIN, RANDOM: Insulin: 13.5 u[IU]/mL

## 2023-04-17 LAB — VITAMIN D 25 HYDROXY (VIT D DEFICIENCY, FRACTURES): Vit D, 25-Hydroxy: 90 ng/mL (ref 30–100)

## 2023-04-17 LAB — MAGNESIUM: Magnesium: 1.8 mg/dL (ref 1.5–2.5)

## 2023-04-17 LAB — TSH: TSH: 0.54 m[IU]/L (ref 0.40–4.50)

## 2023-04-17 NOTE — Progress Notes (Signed)
<>*<>*<>*<>*<>*<>*<>*<>*<>*<>*<>*<>*<>*<>*<>*<>*<>*<>*<>*<>*<>*<>*<>*<>*<> <>*<>*<>*<>*<>*<>*<>*<>*<>*<>*<>*<>*<>*<>*<>*<>*<>*<>*<>*<>*<>*<>*<>*<>*<>  -   U/A is very suspicious for UTI / Infection ,   So asked Lab to also do culture & results may take 2 to 3 days to get results   <>*<>*<>*<>*<>*<>*<>*<>*<>*<>*<>*<>*<>*<>*<>*<>*<>*<>*<>*<>*<>*<>*<>*<>*<> <>*<>*<>*<>*<>*<>*<>*<>*<>*<>*<>*<>*<>*<>*<>*<>*<>*<>*<>*<>*<>*<>*<>*<>*<>  -  A1c = 6.0%  - Blood sugar too high ,  So . . .    - Avoid Sweets, Candy & White Stuff   - White Rice, White [place], White Flour  - Breads &  Pasta  <>*<>*<>*<>*<>*<>*<>*<>*<>*<>*<>*<>*<>*<>*<>*<>*<>*<>*<>*<>*<>*<>*<>*<>*<> <>*<>*<>*<>*<>*<>*<>*<>*<>*<>*<>*<>*<>*<>*<>*<>*<>*<>*<>*<>*<>*<>*<>*<>*<>  -  Chol = 137 - Excellent   - Very low risk for Heart Attack  / Stroke  <>*<>*<>*<>*<>*<>*<>*<>*<>*<>*<>*<>*<>*<>*<>*<>*<>*<>*<>*<>*<>*<>*<>*<>*<> <>*<>*<>*<>*<>*<>*<>*<>*<>*<>*<>*<>*<>*<>*<>*<>*<>*<>*<>*<>*<>*<>*<>*<>*<>  -  Vitamin D = 90  -  Excellent     -        Please keep dosage same   <>*<>*<>*<>*<>*<>*<>*<>*<>*<>*<>*<>*<>*<>*<>*<>*<>*<>*<>*<>*<>*<>*<>*<>*<> <>*<>*<>*<>*<>*<>*<>*<>*<>*<>*<>*<>*<>*<>*<>*<>*<>*<>*<>*<>*<>*<>*<>*<>*<>  -  All Else - CBC - Kidneys - Electrolytes - Liver - Magnesium & Thyroid    - all  Normal / OK  <>*<>*<>*<>*<>*<>*<>*<>*<>*<>*<>*<>*<>*<>*<>*<>*<>*<>*<>*<>*<>*<>*<>*<>*<> <>*<>*<>*<>*<>*<>*<>*<>*<>*<>*<>*<>*<>*<>*<>*<>*<>*<>*<>*<>*<>*<>*<>*<>*<>

## 2023-04-18 DIAGNOSIS — N3000 Acute cystitis without hematuria: Secondary | ICD-10-CM | POA: Diagnosis not present

## 2023-04-20 ENCOUNTER — Other Ambulatory Visit: Payer: Self-pay | Admitting: Internal Medicine

## 2023-04-20 DIAGNOSIS — N3000 Acute cystitis without hematuria: Secondary | ICD-10-CM

## 2023-04-20 LAB — URINE CULTURE
MICRO NUMBER:: 15701074
SPECIMEN QUALITY:: ADEQUATE

## 2023-04-20 MED ORDER — NITROFURANTOIN MONOHYD MACRO 100 MG PO CAPS: ORAL_CAPSULE | ORAL | 0 refills | Status: DC

## 2023-04-20 NOTE — Progress Notes (Signed)
<>*<>*<>*<>*<>*<>*<>*<>*<>*<>*<>*<>*<>*<>*<>*<>*<>*<>*<>*<>*<>*<>*<>*<>*<> <>*<>*<>*<>*<>*<>*<>*<>*<>*<>*<>*<>*<>*<>*<>*<>*<>*<>*<>*<>*<>*<>*<>*<>*<>  -   U/C did grow an infection ( UTI)    &   New Rx for Macrobid  2 x /day sent in for 7 days    Needs to scheduile a NV in  4 weekds to recheck urine to assure Infection is cleared up    <>*<>*<>*<>*<>*<>*<>*<>*<>*<>*<>*<>*<>*<>*<>*<>*<>*<>*<>*<>*<>*<>*<>*<>*<> <>*<>*<>*<>*<>*<>*<>*<>*<>*<>*<>*<>*<>*<>*<>*<>*<>*<>*<>*<>*<>*<>*<>*<>*<>

## 2023-05-03 ENCOUNTER — Other Ambulatory Visit: Payer: Self-pay

## 2023-05-03 ENCOUNTER — Ambulatory Visit (INDEPENDENT_AMBULATORY_CARE_PROVIDER_SITE_OTHER): Payer: Medicare HMO | Admitting: Nurse Practitioner

## 2023-05-03 VITALS — BP 130/70 | HR 70 | Temp 97.6°F | Ht 62.0 in | Wt 140.8 lb

## 2023-05-03 DIAGNOSIS — J069 Acute upper respiratory infection, unspecified: Secondary | ICD-10-CM

## 2023-05-03 DIAGNOSIS — R051 Acute cough: Secondary | ICD-10-CM | POA: Diagnosis not present

## 2023-05-03 DIAGNOSIS — Z1152 Encounter for screening for COVID-19: Secondary | ICD-10-CM

## 2023-05-03 DIAGNOSIS — J309 Allergic rhinitis, unspecified: Secondary | ICD-10-CM | POA: Diagnosis not present

## 2023-05-03 LAB — POC COVID19 BINAXNOW: SARS Coronavirus 2 Ag: NEGATIVE

## 2023-05-03 MED ORDER — PROMETHAZINE-DM 6.25-15 MG/5ML PO SYRP
2.5000 mL | ORAL_SOLUTION | Freq: Four times a day (QID) | ORAL | 0 refills | Status: DC | PRN
Start: 2023-05-03 — End: 2024-03-20

## 2023-05-03 MED ORDER — AZITHROMYCIN 250 MG PO TABS: ORAL_TABLET | ORAL | 1 refills | Status: DC

## 2023-05-03 NOTE — Progress Notes (Unsigned)
Assessment and Plan:  SALOTE DEGROOTE was seen today for an episodic visit.  Diagnoses and all order for this visit:  There are no diagnoses linked to this encounter.   Continue to monitor for any increase in fever, chills, N/V, diarrhea, changes to bowel habits, blood in stool.  Notify office for further evaluation and treatment, questions or concerns if s/s fail to improve. The risks and benefits of my recommendations, as well as other treatment options were discussed with the patient today. Questions were answered.  Further disposition pending results of labs. Discussed med's effects and SE's.    Over *** minutes of exam, counseling, chart review, and critical decision making was performed.   Future Appointments  Date Time Provider Department Center  05/03/2023 11:30 AM Adela Glimpse, NP [facility] None  05/14/2023  2:00 PM [facility] NURSE [facility] None  07/17/2023 11:00 AM Raynelle Dick, NP [facility] None  10/16/2023 11:30 AM Lucky Cowboy, MD [facility] None  01/14/2024 10:00 AM Raynelle Dick, NP [facility] None  04/29/2024 11:00 AM Lucky Cowboy, MD [facility] None    ------------------------------------------------------------------------------------------------------------------   HPI There were no vitals taken for this visit. 89 y.o.female presents for  Past Medical History:  Diagnosis Date   Anemia    Anxiety    Arthritis    CKD (chronic kidney disease) stage 3, GFR 30-59 ml/min (HCC)    Colon polyp    Diverticulosis    Endometrial cancer (HCC)    Hypertension    Hypothyroidism    Iron deficiency    Peripheral neuropathy    in bilateral legs   Plantar fasciitis    Pre-diabetes    Prediabetes    Pt denies   Small bowel obstruction (HCC)      Allergies  Allergen Reactions   Sulfa Antibiotics Other (See Comments)   Codeine Nausea Only   Prednisone Nausea And Vomiting, Nausea Only and Other (See Comments)   Asa [Aspirin] Anxiety and Other (See  Comments)    Take 81mg  cannot take 325mg  gets nervous    Current Outpatient Medications on File Prior to Visit  Medication Sig   Ascorbic Acid (VITAMIN C PO) Take 1,000 mg by mouth daily.   aspirin EC 81 MG tablet Take 81 mg by mouth daily. Swallow whole.   benazepril (LOTENSIN) 40 MG tablet Take 1 tablet by mouth once daily for blood pressure   Cholecalciferol (VITAMIN D3) 125 MCG (5000 UT) TABS Take 5,000 Units by mouth daily.   clorazepate (TRANXENE) 7.5 MG tablet Take 1/2 - 1 tablet 2 - 3 x /day ONLY if needed for Anxiety Attack &  limit to 5 days /week to avoid Addiction & Dementia (Patient taking differently: Take 1.875 mg by mouth daily as needed for anxiety. Take 1/2 - 1 tablet 2 - 3 x /day ONLY if needed for Anxiety Attack &  limit to 5 days /week to avoid Addiction & Dementia)   clotrimazole (LOTRIMIN) 1 % cream Apply 1 application topically 2 (two) times daily.   Cyanocobalamin (VITAMIN B-12) 2500 MCG SUBL Take 2,500 mcg by mouth daily.   Magnesium 250 MG TABS Take 1 tablet by mouth daily.   meclizine (ANTIVERT) 25 MG tablet Take 1/2 to 1  3 x  /day as needed for Dizziness / Vertigo   nitrofurantoin, macrocrystal-monohydrate, (MACROBID) 100 MG capsule Take  1 capsule  2 x / day with Meals for 7 days for UTI   SYNTHROID 150 MCG tablet Take  1 tablet  Daily  on an empty stomach with only water for 30 minutes & no Antacid meds, Calcium or Magnesium for 4 hours & avoid Biotin   zinc gluconate 50 MG tablet Take 50 mg by mouth daily.   No current facility-administered medications on file prior to visit.    ROS: all negative except what is noted in the HPI.   Physical Exam:  There were no vitals taken for this visit.  General Appearance: NAD.  Awake, conversant and cooperative. Eyes: PERRLA, EOMs intact.  Sclera white.  Conjunctiva without erythema. Sinuses: No frontal/maxillary tenderness.  No nasal discharge. Nares patent.  ENT/Mouth: Ext aud canals clear.  Bilateral TMs w/DOL  and without erythema or bulging. Hearing intact.  Posterior pharynx without swelling or exudate.  Tonsils without swelling or erythema.  Neck: Supple.  No masses, nodules or thyromegaly. Respiratory: Effort is regular with non-labored breathing. Breath sounds are equal bilaterally without rales, rhonchi, wheezing or stridor.  Cardio: RRR with no MRGs. Brisk peripheral pulses without edema.  Abdomen: Active BS in all four quadrants.  Soft and non-tender without guarding, rebound tenderness, hernias or masses. Lymphatics: Non tender without lymphadenopathy.  Musculoskeletal: Full ROM, 5/5 strength, normal ambulation.  No clubbing or cyanosis. Skin: Appropriate color for ethnicity. Warm without rashes, lesions, ecchymosis, ulcers.  Neuro: CN II-XII grossly normal. Normal muscle tone without cerebellar symptoms and intact sensation.   Psych: AO X 3,  appropriate mood and affect, insight and judgment.     Adela Glimpse, NP 11:29 AM [facility]

## 2023-05-06 NOTE — Patient Instructions (Signed)

## 2023-05-14 ENCOUNTER — Ambulatory Visit: Payer: Medicare HMO

## 2023-06-13 ENCOUNTER — Ambulatory Visit: Payer: Medicare HMO | Admitting: Nurse Practitioner

## 2023-07-17 ENCOUNTER — Ambulatory Visit: Payer: Medicare HMO | Admitting: Nurse Practitioner

## 2023-07-25 ENCOUNTER — Ambulatory Visit: Payer: Medicare HMO | Admitting: Nurse Practitioner

## 2023-08-14 ENCOUNTER — Other Ambulatory Visit: Payer: Self-pay

## 2023-08-14 DIAGNOSIS — I10 Essential (primary) hypertension: Secondary | ICD-10-CM

## 2023-08-14 MED ORDER — BENAZEPRIL HCL 40 MG PO TABS: ORAL_TABLET | ORAL | 0 refills | Status: DC

## 2023-08-26 ENCOUNTER — Ambulatory Visit: Attending: Cardiology | Admitting: Cardiology

## 2023-08-26 ENCOUNTER — Ambulatory Visit (INDEPENDENT_AMBULATORY_CARE_PROVIDER_SITE_OTHER)

## 2023-08-26 ENCOUNTER — Encounter: Payer: Self-pay | Admitting: Cardiology

## 2023-08-26 VITALS — BP 126/80 | HR 65 | Ht 66.0 in | Wt 145.6 lb

## 2023-08-26 DIAGNOSIS — E782 Mixed hyperlipidemia: Secondary | ICD-10-CM

## 2023-08-26 DIAGNOSIS — Z8679 Personal history of other diseases of the circulatory system: Secondary | ICD-10-CM

## 2023-08-26 DIAGNOSIS — R002 Palpitations: Secondary | ICD-10-CM | POA: Diagnosis not present

## 2023-08-26 DIAGNOSIS — I10 Essential (primary) hypertension: Secondary | ICD-10-CM

## 2023-08-26 DIAGNOSIS — Z79899 Other long term (current) drug therapy: Secondary | ICD-10-CM

## 2023-08-26 DIAGNOSIS — M79605 Pain in left leg: Secondary | ICD-10-CM

## 2023-08-26 DIAGNOSIS — R600 Localized edema: Secondary | ICD-10-CM

## 2023-08-26 DIAGNOSIS — M79604 Pain in right leg: Secondary | ICD-10-CM

## 2023-08-26 MED ORDER — BENAZEPRIL HCL 20 MG PO TABS: 20.0000 mg | ORAL_TABLET | Freq: Every day | ORAL | 3 refills | Status: DC

## 2023-08-26 NOTE — Progress Notes (Signed)
 Cardiology Office Note:    Date:  08/28/2023   ID:  Mikayla Harrison, DOB 09/24/1931, MRN [id number]  PCP:  Lucky Cowboy, MD  Cardiologist:  None  Electrophysiologist:  None   Referring MD: Lucky Cowboy, MD   Chief Complaint  Patient presents with   Palpitations    History of Present Illness:    Mikayla Harrison is a 89 y.o. female with a hx of endometrial cancer, CKD stage III, hypertension, hypothyroidism, iron deficiency anemia, prediabetes, small bowel obstruction who is referred by Dr. Oneta Rack for evaluation of palpitations.  She reports has been having episodes where feels like heart is racing, occurs about every day, mostly in evening.  Lasts for about 1 to 1.5 hours.  She reports some lightheadedness during episodes but denies any syncope.  Also reports some dyspnea during episodes.  She denies any chest pain.  She also reports she has been having pain in her legs when she walks.  She smoked for 5 to 6 years, about 0.25 packs/day, quit in her late 41s.  Family history includes mother had CVA in 82s and father and brothers had heart issues but she does not know details.  Past Medical History:  Diagnosis Date   Anemia    Anxiety    Arthritis    CKD (chronic kidney disease) stage 3, GFR 30-59 ml/min (HCC)    Colon polyp    Diverticulosis    Endometrial cancer (HCC)    Hypertension    Hypothyroidism    Iron deficiency    Peripheral neuropathy    in bilateral legs   Plantar fasciitis    Pre-diabetes    Prediabetes    Pt denies   Small bowel obstruction [facility])     Past Surgical History:  Procedure Laterality Date   CATARACT EXTRACTION     CYSTOSCOPY  10/18/2021   Procedure: CYSTOSCOPY;  Surgeon: Carver Fila, MD;  Location: [facility];  Service: Gynecology;;   EYE SURGERY     MACULAR TEAR     ROBOTIC ASSISTED TOTAL HYSTERECTOMY WITH BILATERAL SALPINGO OOPHERECTOMY N/A 10/18/2021   Procedure: XI ROBOTIC ASSISTED TOTAL HYSTERECTOMY WITH BILATERAL SALPINGO  OOPHORECTOMY;  Surgeon: Carver Fila, MD;  Location: [facility];  Service: Gynecology;  Laterality: N/A;   SENTINEL NODE BIOPSY N/A 10/18/2021   Procedure: SENTINEL NODE BIOPSY;  Surgeon: Carver Fila, MD;  Location: [facility];  Service: Gynecology;  Laterality: N/A;   Vericose vein  1965    Current Medications: Current Meds  Medication Sig   Ascorbic Acid (VITAMIN C PO) Take 2,000 mg by mouth daily.   aspirin EC 81 MG tablet Take 81 mg by mouth daily. Swallow whole.   benazepril (LOTENSIN) 20 MG tablet Take 1 tablet (20 mg total) by mouth daily.   Cholecalciferol (VITAMIN D3) 125 MCG (5000 UT) TABS Take 5,000 Units by mouth daily.   Cyanocobalamin (VITAMIN B-12) 500 MCG SUBL Take 2,500 mcg by mouth daily.   Magnesium 250 MG TABS Take 1 tablet by mouth daily.   SYNTHROID 150 MCG tablet Take  1 tablet  Daily  on an empty stomach with only water for 30 minutes & no Antacid meds, Calcium or Magnesium for 4 hours & avoid Biotin   zinc gluconate 50 MG tablet Take 50 mg by mouth daily.   [DISCONTINUED] amLODipine (NORVASC) 10 MG tablet Take 10 mg by mouth daily.   [DISCONTINUED] benazepril (LOTENSIN) 40 MG tablet Take 1 tablet by mouth once daily for blood pressure  Allergies:   Sulfa antibiotics, Codeine, Prednisone, and Asa [aspirin]   Social History   Socioeconomic History   Marital status: Widowed    Spouse name: Not on file   Number of children: Not on file   Years of education: Not on file   Highest education level: Not on file  Occupational History   Not on file  Tobacco Use   Smoking status: Former    Current packs/day: 0.00    Average packs/day: 1 pack/day for 15.0 years (15.0 ttl pk-yrs)    Types: Cigarettes    Start date: 06/11/1950    Quit date: 06/11/1965    Years since quitting: 58.2   Smokeless tobacco: Never  Vaping Use   Vaping status: Never Used  Substance and Sexual Activity   Alcohol use: No   Drug use: No   Sexual activity: Not Currently  Other  Topics Concern   Not on file  Social History Narrative   Not on file   Social Drivers of Health   Financial Resource Strain: Not on file  Food Insecurity: Not on file  Transportation Needs: Not on file  Physical Activity: Not on file  Stress: Not on file  Social Connections: Not on file     Family History: The patient's family history includes Cancer in her maternal aunt; Colon polyps in her sister; Diabetes in her sister; Heart disease in her brother, father, and mother; Hypertension in her son; Kidney cancer in her nephew; Stroke in her brother, mother, and sister. There is no history of Breast cancer, Ovarian cancer, Endometrial cancer, Pancreatic cancer, or Prostate cancer.  ROS:   Please see the history of present illness.     All other systems reviewed and are negative.  EKGs/Labs/Other Studies Reviewed:    The following studies were reviewed today:   EKG:   08/26/2023: Normal sinus rhythm, rate 65, low voltage, poor R wave progression  Recent Labs: 04/16/2023: ALT 13; BUN 21; Creat 0.98; Hemoglobin 10.2; Magnesium 1.8; Platelets 274; Potassium 4.5; Sodium 135; TSH 0.54  Recent Lipid Panel    Component Value Date/Time   CHOL 137 04/16/2023 1110   TRIG 85 04/16/2023 1110   HDL 51 04/16/2023 1110   CHOLHDL 2.7 04/16/2023 1110   VLDL 14 11/07/2016 0426   LDLCALC 69 04/16/2023 1110    Physical Exam:    VS:  BP 126/80 (BP Location: Left Arm, Patient Position: Sitting, Cuff Size: Small)   Pulse 65   Ht 5\' 6"  (1.676 m)   Wt 145 lb 9.6 oz (66 kg)   SpO2 99%   BMI 23.50 kg/m     Wt Readings from Last 3 Encounters:  08/26/23 145 lb 9.6 oz (66 kg)  05/03/23 140 lb 12.8 oz (63.9 kg)  04/16/23 138 lb 3.2 oz (62.7 kg)     GEN:  Well nourished, well developed in no acute distress HEENT: Normal NECK: No JVD; No carotid bruits LYMPHATICS: No lymphadenopathy CARDIAC: RRR, no murmurs, rubs, gallops RESPIRATORY:  Clear to auscultation without rales, wheezing or rhonchi   ABDOMEN: Soft, non-tender, non-distended MUSCULOSKELETAL: Mild lower extremity edema; No deformity  SKIN: Warm and dry NEUROLOGIC:  Alert and oriented x 3 PSYCHIATRIC:  Normal affect   ASSESSMENT:    1. Palpitations   2. Essential hypertension   3. Medication management   4. Hyperlipidemia, mixed   5. Edema of left lower extremity   6. Pain in both lower extremities   7. History of carotid artery stenosis  PLAN:    Palpitations: Description concerning for arrhythmia, evaluate with Zio patch x 7 days.  Check echocardiogram to rule out structural heart disease.  Hypertension: On benazepril 20 mg daily and takes amlodipine as needed if BP is elevated.  Recommend stopping as needed amlodipine as reports only takes about once per week  Lower extremity edema: Mild bilateral lower extremity edema on exam.  Check CMET, BNP.  Lower extremity duplex last year was negative for DVT.  Check echocardiogram to rule out structural heart disease as above.  Amlodipine use could be contributing, will discontinue.  Recommend use of compression stockings  Carotid artery stenosis: Mild bilateral carotid artery stenosis on carotid duplex in 2018.  Will update carotid duplex  Leg pain: Reports bilateral lower extremity pain when she walks.  Will check ABIs  RTC in 3 months   Medication Adjustments/Labs and Tests Ordered: Current medicines are reviewed at length with the patient today.  Concerns regarding medicines are outlined above.  Orders Placed This Encounter  Procedures   Comprehensive metabolic panel   B Nat Peptide   Lipid panel   LONG TERM MONITOR (3-14 DAYS)   EKG 12-Lead   ECHOCARDIOGRAM COMPLETE   VAS [place] ABI WITH/WO TBI   VAS US CAROTID   Meds ordered this encounter  Medications   benazepril (LOTENSIN) 20 MG tablet    Sig: Take 1 tablet (20 mg total) by mouth daily.    Dispense:  90 tablet    Refill:  3    Patient Instructions  Medication Instructions:  STOP  Amlodipine  *If you need a refill on your cardiac medications before your next appointment, please call your pharmacy*   Lab Work: C-MET  BNP Lipid Panel If you have labs (blood work) drawn today and your tests are completely normal, you will receive your results only by: MyChart Message (if you have MyChart) OR A paper copy in the mail If you have any lab test that is abnormal or we need to change your treatment, we will call you to review the results.   Testing/Procedures: Echo will be scheduled at [address].  Your physician has requested that you have an echocardiogram. Echocardiography is a painless test that uses sound waves to create images of your heart. It provides your doctor with information about the size and shape of your heart and how well your heart's chambers and valves are working. This procedure takes approximately one hour. There are no restrictions for this procedure. Please do NOT wear cologne, perfume, aftershave, or lotions (deodorant is allowed). Please arrive 15 minutes prior to your appointment time.    Your physician has requested that you have an ankle brachial index (ABI). During this test an ultrasound and blood pressure cuff are used to evaluate the arteries that supply the arms and legs with blood. Allow thirty minutes for this exam. There are no restrictions or special instructions.  Please note: We ask at that you not bring children with you during ultrasound (echo/ vascular) testing. Due to room size and safety concerns, children are not allowed in the ultrasound rooms during exams. Our front office staff cannot provide observation of children in our lobby area while testing is being conducted. An adult accompanying a patient to their appointment will only be allowed in the ultrasound room at the discretion of the ultrasound technician under special circumstances. We apologize for any inconvenience.    Your physician has requested that you  have a carotid duplex.  This test is an ultrasound of the carotid arteries in your neck. It looks at blood flow through these arteries that supply the brain with blood. Allow one hour for this exam. There are no restrictions or special instructions.   Follow-Up: At [facility], you and your health needs are our priority.  As part of our continuing mission to provide you with exceptional heart care, we have created designated Provider Care Teams.  These Care Teams include your primary Cardiologist (physician) and Advanced Practice Providers (APPs -  Physician Assistants and Nurse Practitioners) who all work together to provide you with the care you need, when you need it.  We recommend signing up for the patient portal called "MyChart".  Sign up information is provided on this After Visit Summary.  MyChart is used to connect with patients for Virtual Visits (Telemedicine).  Patients are able to view lab/test results, encounter notes, upcoming appointments, etc.  Non-urgent messages can be sent to your provider as well.   To learn more about what you can do with MyChart, go to [email or web address].    Your next appointment:    3 months   Provider:    Dr. Bjorn Pippin  Other Instructions How to Use Compression Stockings Compression stockings are elastic socks that squeeze the legs. They help to increase blood flow to the legs, decrease swelling in the legs, and reduce the chance of developing blood clots in the lower legs. Compression stockings are often used by people who: Are recovering from surgery. Have poor circulation in their legs. Are prone to getting blood clots in their legs. Have varicose veins. Sit or stay in bed for long periods of time. How to use compression stockings Before you put on your compression stockings: Make sure that they are the correct size. If you do not know your size, ask your health care provider. Make sure that they are clean, dry, and in good  condition. Check them for rips and tears. Do not put them on if they are ripped or torn. Put your stockings on first thing in the morning, before you get out of bed. Keep them on for as long as your health care provider advises. When you are wearing your stockings: Keep them as smooth as possible. Do not allow them to bunch up. It is especially important to prevent the stockings from bunching up around your toes or behind your knees. Do not roll the stockings downward and leave them rolled down. This can decrease blood flow to your leg. Change them right away if they become wet or dirty. When you take off your stockings, inspect your legs and feet. Anything that does not seem normal may require medical attention. Look for: Open sores. Red spots. Swelling. Information and tips Do not stop wearing your compression stockings without talking to your health care provider first. Wash your stockings every day with mild detergent in cold or warm water. Do not use bleach. Air-dry your stockings or dry them in a clothes dryer on low heat. Replace your stockings every 3-6 months. If skin moisturizing is part of your treatment plan, apply lotion or cream at night so that your skin will be dry when you put on the stockings in the morning. It is harder to put the stockings on when you have lotion on your legs or feet. Contact a health care provider if: Remove your stockings and seek medical care if: You have a feeling of pins and needles in your feet or legs. You have  any new changes in your skin. You have skin lesions that are getting worse. You have swelling or pain that is getting worse. Get help right away if: You have numbness or tingling in your lower legs that does not get better right after you take the stockings off. Your toes or feet become cold and blue. You develop open sores or red spots on your legs that do not go away. You see or feel a warm spot on your leg. You have new swelling or  soreness in your leg. You are short of breath or you have chest pain for no reason. You have a rapid or irregular heartbeat. You feel light-headed or dizzy. This information is not intended to replace advice given to you by your health care provider. Make sure you discuss any questions you have with your health care provider. Document Released: 03/25/2009 Document Revised: 10/26/2015 Document Reviewed: 05/05/2014 Elsevier Interactive Patient Education  2017 [employer] Inc[employer]   [employer] AT Long term monitor-Live Telemetry  Your physician has requested you wear a ZIO patch monitor for 14 days.  This is a single patch monitor. [employer] supplies one patch monitor per enrollment. Additional  stickers are not available.  Please do not apply patch if you will be having a Nuclear Stress Test, Echocardiogram, Cardiac CT, MRI,  or Chest Xray during the period you would be wearing the monitor. The patch cannot be worn during  these tests. You cannot remove and re-apply the ZIO AT patch monitor.  Your ZIO patch monitor will be mailed 3 day USPS to your address on file. It may take 3-5 days to  receive your monitor after you have been enrolled.  Once you have received your monitor, please review the enclosed instructions. Your monitor has  already been registered assigning a specific monitor serial # to you.   Billing and Patient Assistance Program information  Meredeth Ide has been supplied with any insurance information on record for billing. [employer] offers a sliding scale Patient Assistance Program for patients without insurance, or whose  insurance does not completely cover the cost of the ZIO patch monitor. You must apply for the  Patient Assistance Program to qualify for the discounted rate. To apply, call [employer] at [phone],  select option 4, select option 2 , ask to apply for the Patient Assistance Program, (you can request an  interpreter if needed). [employer] will ask your household income and how  many people are in your  household. [employer] will quote your out-of-pocket cost based on this information. They will also be able  to set up a 12 month interest free payment plan if needed.  Applying the monitor   Shave hair from upper left chest.  Hold the abrader disc by orange tab. Rub the abrader in 40 strokes over left upper chest as indicated in  your monitor instructions.  Clean area with 4 enclosed alcohol pads. Use all pads to ensure the area is cleaned thoroughly. Let  dry.  Apply patch as indicated in monitor instructions. Patch will be placed under collarbone on left side of  chest with arrow pointing upward.  Rub patch adhesive wings for 2 minutes. Remove the white label marked "1". Remove the white label  marked "2". Rub patch adhesive wings for 2 additional minutes.  While looking in a mirror, press and release button in center of patch. A small green light will flash 3-4  times. This will be your only indicator that the monitor has been turned on.  Do  not shower for the first 24 hours. You may shower after the first 24 hours.  Press the button if you feel a symptom. You will hear a small click. Record Date, Time and Symptom in  the Patient Log.   Starting the Gateway  In your kit there is a Audiological scientist box the size of a cellphone. This is [employer]er, retail. It transmits all your  recorded data to [facility]. This box must always stay within 10 feet of you. Open the box and push the *  button. There will be a light that blinks orange and then green a few times. When the light stops  blinking, the Gateway is connected to the ZIO patch. Call [employer] at [phone] to confirm your monitor is transmitting.  Returning your monitor  Remove your patch and place it inside the Gateway. In the lower half of the Gateway there is a white  bag with prepaid postage on it. Place Gateway in bag and seal. Mail package back to [place] as soon as  possible. Your physician should have your  final report approximately 7 days after you have mailed back  your monitor. Call [facility] Customer Care at [phone] if you have questions regarding your ZIO AT  patch monitor. Call them immediately if you see an orange light blinking on your monitor.  If your monitor falls off in less than 4 days, contact our Monitor department at [phone]. If your  monitor becomes loose or falls off after 4 days call [employer] at [phone] for suggestions on  securing your monitor       Signed, Little Ishikawa, MD  08/28/2023 9:40 AM    [facility]

## 2023-08-26 NOTE — Progress Notes (Unsigned)
 Enrolled for [employer] to mail a ZIO XT long term holter monitor to the patients address on file.

## 2023-08-26 NOTE — Patient Instructions (Addendum)
 Medication Instructions:  STOP Amlodipine  *If you need a refill on your cardiac medications before your next appointment, please call your pharmacy*   Lab Work: C-MET  BNP Lipid Panel If you have labs (blood work) drawn today and your tests are completely normal, you will receive your results only by: MyChart Message (if you have MyChart) OR A paper copy in the mail If you have any lab test that is abnormal or we need to change your treatment, we will call you to review the results.   Testing/Procedures: Echo will be scheduled at [address].  Your physician has requested that you have an echocardiogram. Echocardiography is a painless test that uses sound waves to create images of your heart. It provides your doctor with information about the size and shape of your heart and how well your heart's chambers and valves are working. This procedure takes approximately one hour. There are no restrictions for this procedure. Please do NOT wear cologne, perfume, aftershave, or lotions (deodorant is allowed). Please arrive 15 minutes prior to your appointment time.    Your physician has requested that you have an ankle brachial index (ABI). During this test an ultrasound and blood pressure cuff are used to evaluate the arteries that supply the arms and legs with blood. Allow thirty minutes for this exam. There are no restrictions or special instructions.  Please note: We ask at that you not bring children with you during ultrasound (echo/ vascular) testing. Due to room size and safety concerns, children are not allowed in the ultrasound rooms during exams. Our front office staff cannot provide observation of children in our lobby area while testing is being conducted. An adult accompanying a patient to their appointment will only be allowed in the ultrasound room at the discretion of the ultrasound technician under special circumstances. We apologize for any inconvenience.    Your  physician has requested that you have a carotid duplex. This test is an ultrasound of the carotid arteries in your neck. It looks at blood flow through these arteries that supply the brain with blood. Allow one hour for this exam. There are no restrictions or special instructions.   Follow-Up: At [facility], you and your health needs are our priority.  As part of our continuing mission to provide you with exceptional heart care, we have created designated Provider Care Teams.  These Care Teams include your primary Cardiologist (physician) and Advanced Practice Providers (APPs -  Physician Assistants and Nurse Practitioners) who all work together to provide you with the care you need, when you need it.  We recommend signing up for the patient portal called "MyChart".  Sign up information is provided on this After Visit Summary.  MyChart is used to connect with patients for Virtual Visits (Telemedicine).  Patients are able to view lab/test results, encounter notes, upcoming appointments, etc.  Non-urgent messages can be sent to your provider as well.   To learn more about what you can do with MyChart, go to [email or web address].    Your next appointment:    3 months   Provider:    Dr. Bjorn Pippin  Other Instructions How to Use Compression Stockings Compression stockings are elastic socks that squeeze the legs. They help to increase blood flow to the legs, decrease swelling in the legs, and reduce the chance of developing blood clots in the lower legs. Compression stockings are often used by people who: Are recovering from surgery. Have poor circulation in their  legs. Are prone to getting blood clots in their legs. Have varicose veins. Sit or stay in bed for long periods of time. How to use compression stockings Before you put on your compression stockings: Make sure that they are the correct size. If you do not know your size, ask your health care provider. Make sure that they are  clean, dry, and in good condition. Check them for rips and tears. Do not put them on if they are ripped or torn. Put your stockings on first thing in the morning, before you get out of bed. Keep them on for as long as your health care provider advises. When you are wearing your stockings: Keep them as smooth as possible. Do not allow them to bunch up. It is especially important to prevent the stockings from bunching up around your toes or behind your knees. Do not roll the stockings downward and leave them rolled down. This can decrease blood flow to your leg. Change them right away if they become wet or dirty. When you take off your stockings, inspect your legs and feet. Anything that does not seem normal may require medical attention. Look for: Open sores. Red spots. Swelling. Information and tips Do not stop wearing your compression stockings without talking to your health care provider first. Wash your stockings every day with mild detergent in cold or warm water. Do not use bleach. Air-dry your stockings or dry them in a clothes dryer on low heat. Replace your stockings every 3-6 months. If skin moisturizing is part of your treatment plan, apply lotion or cream at night so that your skin will be dry when you put on the stockings in the morning. It is harder to put the stockings on when you have lotion on your legs or feet. Contact a health care provider if: Remove your stockings and seek medical care if: You have a feeling of pins and needles in your feet or legs. You have any new changes in your skin. You have skin lesions that are getting worse. You have swelling or pain that is getting worse. Get help right away if: You have numbness or tingling in your lower legs that does not get better right after you take the stockings off. Your toes or feet become cold and blue. You develop open sores or red spots on your legs that do not go away. You see or feel a warm spot on your leg. You  have new swelling or soreness in your leg. You are short of breath or you have chest pain for no reason. You have a rapid or irregular heartbeat. You feel light-headed or dizzy. This information is not intended to replace advice given to you by your health care provider. Make sure you discuss any questions you have with your health care provider. Document Released: 03/25/2009 Document Revised: 10/26/2015 Document Reviewed: 05/05/2014 Elsevier Interactive Patient Education  2017 Elsevier Inc.   [employer] AT Long term monitor-Live Telemetry  Your physician has requested you wear a ZIO patch monitor for 14 days.  This is a single patch monitor. [employer] supplies one patch monitor per enrollment. Additional  stickers are not available.  Please do not apply patch if you will be having a Nuclear Stress Test, Echocardiogram, Cardiac CT, MRI,  or Chest Xray during the period you would be wearing the monitor. The patch cannot be worn during  these tests. You cannot remove and re-apply the ZIO AT patch monitor.  Your ZIO patch monitor will be mailed  3 day USPS to your address on file. It may take 3-5 days to  receive your monitor after you have been enrolled.  Once you have received your monitor, please review the enclosed instructions. Your monitor has  already been registered assigning a specific monitor serial # to you.   Billing and Patient Assistance Program information  Meredeth Ide has been supplied with any insurance information on record for billing. [employer] offers a sliding scale Patient Assistance Program for patients without insurance, or whose  insurance does not completely cover the cost of the ZIO patch monitor. You must apply for the  Patient Assistance Program to qualify for the discounted rate. To apply, call [employer] at [phone],  select option 4, select option 2 , ask to apply for the Patient Assistance Program, (you can request an  interpreter if needed). [employer] will ask your  household income and how many people are in your  household. [employer] will quote your out-of-pocket cost based on this information. They will also be able  to set up a 12 month interest free payment plan if needed.  Applying the monitor   Shave hair from upper left chest.  Hold the abrader disc by orange tab. Rub the abrader in 40 strokes over left upper chest as indicated in  your monitor instructions.  Clean area with 4 enclosed alcohol pads. Use all pads to ensure the area is cleaned thoroughly. Let  dry.  Apply patch as indicated in monitor instructions. Patch will be placed under collarbone on left side of  chest with arrow pointing upward.  Rub patch adhesive wings for 2 minutes. Remove the white label marked "1". Remove the white label  marked "2". Rub patch adhesive wings for 2 additional minutes.  While looking in a mirror, press and release button in center of patch. A small green light will flash 3-4  times. This will be your only indicator that the monitor has been turned on.  Do not shower for the first 24 hours. You may shower after the first 24 hours.  Press the button if you feel a symptom. You will hear a small click. Record Date, Time and Symptom in  the Patient Log.   Starting the Gateway  In your kit there is a Audiological scientist box the size of a cellphone. This is Buyer, retail. It transmits all your  recorded data to [facility]. This box must always stay within 10 feet of you. Open the box and push the *  button. There will be a light that blinks orange and then green a few times. When the light stops  blinking, the Gateway is connected to the ZIO patch. Call [employer] at [phone] to confirm your monitor is transmitting.  Returning your monitor  Remove your patch and place it inside the Gateway. In the lower half of the Gateway there is a white  bag with prepaid postage on it. Place Gateway in bag and seal. Mail package back to [place] as soon as  possible. Your  physician should have your final report approximately 7 days after you have mailed back  your monitor. Call [facility] Customer Care at [phone] if you have questions regarding your ZIO AT  patch monitor. Call them immediately if you see an orange light blinking on your monitor.  If your monitor falls off in less than 4 days, contact our Monitor department at [phone]. If your  monitor becomes loose or falls off after 4 days call [employer] at [phone] for suggestions on  securing your monitor

## 2023-08-28 LAB — COMPREHENSIVE METABOLIC PANEL

## 2023-08-28 LAB — BRAIN NATRIURETIC PEPTIDE: BNP: 167.6 pg/mL — ABNORMAL HIGH (ref 0.0–100.0)

## 2023-08-28 LAB — LIPID PANEL

## 2023-09-04 ENCOUNTER — Telehealth: Payer: Self-pay | Admitting: *Deleted

## 2023-09-04 DIAGNOSIS — Z79899 Other long term (current) drug therapy: Secondary | ICD-10-CM

## 2023-09-04 DIAGNOSIS — I10 Essential (primary) hypertension: Secondary | ICD-10-CM

## 2023-09-04 DIAGNOSIS — E782 Mixed hyperlipidemia: Secondary | ICD-10-CM

## 2023-09-04 NOTE — Telephone Encounter (Signed)
 Received notification from [facility] that lipid panel and CMP drawn on 08/27/23 will need to be repeated due to a lab error. [facility] Supervisor has initiated a English as a second language teacher form, so there will be no charge for the patient for this redraw. Pt has upcoming imaging appointments on 09/12/23. Per Dr. Bjorn Pippin, labs can wait until that time.   LMOM (DPR) requesting call back regarding lab work. Direct number provided.

## 2023-09-06 NOTE — Telephone Encounter (Signed)
 Spoke with patient and relayed information about needing to have CMP and lipid panel redrawn when she returns to the office on 09/12/23. Pt reports she is unable to fast. She denies additional questions at this time.   Lab orders entered and [employer] made aware.

## 2023-09-09 ENCOUNTER — Encounter: Payer: Self-pay | Admitting: Emergency Medicine

## 2023-09-09 ENCOUNTER — Ambulatory Visit: Admission: EM | Admit: 2023-09-09 | Discharge: 2023-09-09 | Disposition: A | Discharge status: Home / Self Care

## 2023-09-09 DIAGNOSIS — B9789 Other viral agents as the cause of diseases classified elsewhere: Secondary | ICD-10-CM | POA: Diagnosis not present

## 2023-09-09 DIAGNOSIS — R0602 Shortness of breath: Secondary | ICD-10-CM | POA: Insufficient documentation

## 2023-09-09 DIAGNOSIS — J069 Acute upper respiratory infection, unspecified: Secondary | ICD-10-CM | POA: Diagnosis not present

## 2023-09-09 DIAGNOSIS — R059 Cough, unspecified: Secondary | ICD-10-CM | POA: Diagnosis present

## 2023-09-09 MED ORDER — BENZONATATE 100 MG PO CAPS: 100.0000 mg | ORAL_CAPSULE | Freq: Three times a day (TID) | ORAL | 0 refills | Status: DC | PRN

## 2023-09-10 LAB — SARS CORONAVIRUS 2 (TAT 6-24 HRS): SARS Coronavirus 2: NEGATIVE

## 2023-09-11 ENCOUNTER — Encounter: Payer: Self-pay | Admitting: Cardiology

## 2023-09-11 ENCOUNTER — Telehealth: Payer: Self-pay

## 2023-09-11 NOTE — Telephone Encounter (Signed)
 Patient notified. No further questions or concerns.  Yancey Flemings CMA

## 2023-09-11 NOTE — Telephone Encounter (Signed)
 Incoming call/provided information:  mrn [id number] Mikayla Harrison (merging w/ [id number]) was seen 3/31 and is wondering if she can get some antibiotics prescribed. [phone]  Outgoing call/information discussed:   "This started with Friday (6 days of symptoms) before last appt, sorethroat, then sinus drainage/congestion and now pressure and sinus pain". "The cough is productive and I am coughing up a lot of mess". No fever known. "I did not pick up the cough medicine because it showed to many side effects". "I told her it was a sinus infection and I would like an antibiotic".  Pharmacy: [facility] [address]  Forwarded to provider in clinic for review/recommendations.  Yancey Flemings CMA

## 2023-09-12 ENCOUNTER — Ambulatory Visit (HOSPITAL_COMMUNITY)

## 2023-09-18 ENCOUNTER — Ambulatory Visit (HOSPITAL_COMMUNITY)

## 2023-09-19 ENCOUNTER — Ambulatory Visit (HOSPITAL_COMMUNITY)

## 2023-09-25 ENCOUNTER — Encounter: Payer: Self-pay | Admitting: Cardiology

## 2023-10-02 DIAGNOSIS — R002 Palpitations: Secondary | ICD-10-CM

## 2023-10-08 ENCOUNTER — Other Ambulatory Visit: Payer: Self-pay | Admitting: *Deleted

## 2023-10-08 MED ORDER — METOPROLOL SUCCINATE ER 25 MG PO TB24: 25.0000 mg | ORAL_TABLET | Freq: Every day | ORAL | 3 refills | Status: DC

## 2023-10-08 NOTE — Progress Notes (Signed)
 Called and made patient aware of Dr. Alda Amas recommendations. Start Toprol XL 25 mg daily and appt made 5/2 @11 :20.Patient verbalized understanding.

## 2023-10-11 ENCOUNTER — Encounter: Payer: Self-pay | Admitting: Cardiology

## 2023-10-11 ENCOUNTER — Ambulatory Visit: Attending: Cardiology | Admitting: Cardiology

## 2023-10-11 VITALS — BP 132/74 | HR 78 | Ht 66.0 in | Wt 143.2 lb

## 2023-10-11 DIAGNOSIS — R600 Localized edema: Secondary | ICD-10-CM | POA: Diagnosis not present

## 2023-10-11 DIAGNOSIS — M79604 Pain in right leg: Secondary | ICD-10-CM

## 2023-10-11 DIAGNOSIS — I10 Essential (primary) hypertension: Secondary | ICD-10-CM

## 2023-10-11 DIAGNOSIS — I480 Paroxysmal atrial fibrillation: Secondary | ICD-10-CM | POA: Diagnosis not present

## 2023-10-11 DIAGNOSIS — M79605 Pain in left leg: Secondary | ICD-10-CM

## 2023-10-11 DIAGNOSIS — Z8679 Personal history of other diseases of the circulatory system: Secondary | ICD-10-CM

## 2023-10-11 MED ORDER — APIXABAN 5 MG PO TABS: 5.0000 mg | ORAL_TABLET | Freq: Two times a day (BID) | ORAL | 3 refills | Status: DC

## 2023-10-11 MED ORDER — APIXABAN 5 MG PO TABS: 5.0000 mg | ORAL_TABLET | Freq: Two times a day (BID) | ORAL | Status: DC

## 2023-10-11 NOTE — Progress Notes (Signed)
 Cardiology Office Note:    Date:  10/11/2023   ID:  Mikayla Harrison, DOB November 18, 1931, MRN [id number]  PCP:  Patient, No Pcp Per  Cardiologist:  None  Electrophysiologist:  None   Referring MD: No ref. provider found   Chief Complaint  Patient presents with   Atrial Fibrillation    History of Present Illness:    Mikayla Harrison is a 89 y.o. female with a hx of endometrial cancer, CKD stage III, hypertension, hypothyroidism, iron deficiency anemia, prediabetes, small bowel obstruction who presents for follow-up.  She was referred by Dr. Cassondra Cliff for evaluation of palpitations, initially seen 08/26/2023.  She reports has been having episodes where feels like heart is racing, occurs about every day, mostly in evening.  Lasts for about 1 to 1.5 hours.  She reports some lightheadedness during episodes but denies any syncope.  Also reports some dyspnea during episodes.  She denies any chest pain.  She also reports she has been having pain in her legs when she walks.  She smoked for 5 to 6 years, about 0.25 packs/day, quit in her late 54s.  Family history includes mother had CVA in 13s and father and brothers had heart issues but she does not know details.  Zio patch x 8 days 08/2023 showed 4% A-fib burden, longest episode lasting 2.5 hours.  Average rate in A-fib was 113 bpm.  Since last clinic visit, she reports she is doing okay.  Continues to have occasional palpitations and shortness of breath.  Denies any chest pain.  No history of any bleeding issues.  Past Medical History:  Diagnosis Date   Anemia    Anxiety    Arthritis    CKD (chronic kidney disease) stage 3, GFR 30-59 ml/min (HCC)    Colon polyp    Diverticulosis    Endometrial cancer (HCC)    Hypertension    Hypothyroidism    Iron deficiency    Peripheral neuropathy    in bilateral legs   Plantar fasciitis    Pre-diabetes    Prediabetes    Pt denies   Small bowel obstruction [facility])     Past Surgical History:  Procedure Laterality  Date   CATARACT EXTRACTION     CYSTOSCOPY  10/18/2021   Procedure: CYSTOSCOPY;  Surgeon: Suzi Essex, MD;  Location: [facility];  Service: Gynecology;;   EYE SURGERY     MACULAR TEAR     ROBOTIC ASSISTED TOTAL HYSTERECTOMY WITH BILATERAL SALPINGO OOPHERECTOMY N/A 10/18/2021   Procedure: XI ROBOTIC ASSISTED TOTAL HYSTERECTOMY WITH BILATERAL SALPINGO OOPHORECTOMY;  Surgeon: Suzi Essex, MD;  Location: [facility];  Service: Gynecology;  Laterality: N/A;   SENTINEL NODE BIOPSY N/A 10/18/2021   Procedure: SENTINEL NODE BIOPSY;  Surgeon: Suzi Essex, MD;  Location: [facility];  Service: Gynecology;  Laterality: N/A;   Vericose vein  1965    Current Medications: Current Meds  Medication Sig   apixaban (ELIQUIS) 5 MG TABS tablet Take 1 tablet (5 mg total) by mouth 2 (two) times daily.   apixaban (ELIQUIS) 5 MG TABS tablet Take 1 tablet (5 mg total) by mouth 2 (two) times daily.   Ascorbic Acid (VITAMIN C PO) Take 2,000 mg by mouth daily.   benazepril  (LOTENSIN ) 20 MG tablet Take 1 tablet (20 mg total) by mouth daily.   Cholecalciferol (VITAMIN D3) 125 MCG (5000 UT) TABS Take 5,000 Units by mouth daily.   clorazepate  (TRANXENE ) 7.5 MG tablet Take 1/2 - 1 tablet 2 -  3 x /day ONLY if needed for Anxiety Attack &  limit to 5 days /week to avoid Addiction & Dementia   Cyanocobalamin  (VITAMIN B-12) 500 MCG SUBL Take 2,500 mcg by mouth daily.   levothyroxine  (SYNTHROID ) 25 MCG tablet Take 25 mcg by mouth daily before breakfast.   Magnesium 250 MG TABS Take 1 tablet by mouth daily.   meclizine  (ANTIVERT ) 25 MG tablet Take 1/2 to 1  3 x  /day as needed for Dizziness / Vertigo   SYNTHROID  150 MCG tablet Take  1 tablet  Daily  on an empty stomach with only water  for 30 minutes & no Antacid meds, Calcium or Magnesium for 4 hours & avoid Biotin   zinc gluconate 50 MG tablet Take 50 mg by mouth daily.     Allergies:   Sulfa antibiotics, Codeine, Prednisone , Asa [aspirin ], Codeine, Prednisone , and  Tylenol  [acetaminophen ]   Social History   Socioeconomic History   Marital status: Widowed    Spouse name: Not on file   Number of children: Not on file   Years of education: Not on file   Highest education level: Not on file  Occupational History   Not on file  Tobacco Use   Smoking status: Former    Current packs/day: 0.00    Average packs/day: 1 pack/day for 15.0 years (15.0 ttl pk-yrs)    Types: Cigarettes    Start date: 06/11/1950    Quit date: 06/11/1965    Years since quitting: 58.3   Smokeless tobacco: Never  Vaping Use   Vaping status: Never Used  Substance and Sexual Activity   Alcohol use: No   Drug use: No   Sexual activity: Not Currently  Other Topics Concern   Not on file  Social History Narrative   ** Merged History Encounter **       Social Drivers of Corporate investment banker Strain: Not on file  Food Insecurity: Not on file  Transportation Needs: Not on file  Physical Activity: Not on file  Stress: Not on file  Social Connections: Not on file     Family History: The patient's family history includes Cancer in her maternal aunt; Colon polyps in her sister; Diabetes in her sister; Heart disease in her brother, father, and mother; Hypertension in her son; Kidney cancer in her nephew; Stroke in her brother, mother, and sister. There is no history of Breast cancer, Ovarian cancer, Endometrial cancer, Pancreatic cancer, or Prostate cancer.  ROS:   Please see the history of present illness.     All other systems reviewed and are negative.  EKGs/Labs/Other Studies Reviewed:    The following studies were reviewed today:   EKG:   08/26/2023: Normal sinus rhythm, rate 65, low voltage, poor R wave progression  Recent Labs: 04/16/2023: ALT 13; BUN 21; Creat 0.98; Hemoglobin 10.2; Magnesium 1.8; Platelets 274; Potassium 4.5; Sodium 135; TSH 0.54 08/27/2023: BNP 167.6  Recent Lipid Panel    Component Value Date/Time   CHOL CANCELED 08/27/2023 1308   TRIG  85 04/16/2023 1110   HDL 51 04/16/2023 1110   CHOLHDL 2.7 04/16/2023 1110   VLDL 14 11/07/2016 0426   LDLCALC 69 04/16/2023 1110    Physical Exam:    VS:  BP 132/74 (BP Location: Left Arm, Patient Position: Sitting)   Pulse 78   Ht 5\' 6"  (1.676 m)   Wt 143 lb 3.2 oz (65 kg)   LMP  (LMP Unknown)   SpO2 99%   BMI 23.11  kg/m     Wt Readings from Last 3 Encounters:  10/11/23 143 lb 3.2 oz (65 kg)  08/26/23 145 lb 9.6 oz (66 kg)  05/03/23 140 lb 12.8 oz (63.9 kg)     GEN:  Well nourished, well developed in no acute distress HEENT: Normal NECK: No JVD; No carotid bruits LYMPHATICS: No lymphadenopathy CARDIAC: RRR, no murmurs, rubs, gallops RESPIRATORY:  Clear to auscultation without rales, wheezing or rhonchi  ABDOMEN: Soft, non-tender, non-distended MUSCULOSKELETAL: Mild lower extremity edema; No deformity  SKIN: Warm and dry NEUROLOGIC:  Alert and oriented x 3 PSYCHIATRIC:  Normal affect   ASSESSMENT:    1. Paroxysmal atrial fibrillation (HCC)   2. Edema of left lower extremity   3. Essential hypertension   4. Pain in both lower extremities   5. History of carotid artery stenosis     PLAN:    Atrial fibrillation: She reported palpitations,Zio patch x 8 days 08/2023 showed 4% A-fib burden, longest episode lasting 2.5 hours.  Average rate in A-fib was 113 bpm.  CHA2DS2-VASc 4 (hypertension, age x 2, female) - Start Eliquis 5 mg twice daily - Start Toprol -XL 25 mg daily - Echocardiogram  Hypertension: On benazepril  20 mg daily and started Toprol -XL 25 mg daily  Lower extremity edema: Mild bilateral lower extremity edema on exam.  Check CMET, BNP.  Lower extremity duplex last year was negative for DVT.  Check echocardiogram to rule out structural heart disease as above.  Amlodipine  use could be contributing, was discontinued.  Recommend use of compression stockings  Carotid artery stenosis: Mild bilateral carotid artery stenosis on carotid duplex in 2018.  Will update  carotid duplex  Leg pain: Reports bilateral lower extremity pain when she walks.  Will check ABIs  RTC in 3 months   Medication Adjustments/Labs and Tests Ordered: Current medicines are reviewed at length with the patient today.  Concerns regarding medicines are outlined above.  Orders Placed This Encounter  Procedures   Comp Met (CMET)   CBC w/Diff   Meds ordered this encounter  Medications   apixaban (ELIQUIS) 5 MG TABS tablet    Sig: Take 1 tablet (5 mg total) by mouth 2 (two) times daily.    Dispense:  180 tablet    Refill:  3   apixaban (ELIQUIS) 5 MG TABS tablet    Sig: Take 1 tablet (5 mg total) by mouth 2 (two) times daily.    Lot Number?:   [id number]    Expiration Date?:   09/08/2024    Quantity:   28             2 box of 14    Patient Instructions  Medication Instructions:  Start Toprol  XL 25 mg daily  Start Eliquis 5 mg twice a day  No Aspirin   No NSAIDS motrin,aleve ibuprofen  *If you need a refill on your cardiac medications before your next appointment, please call your pharmacy*  Lab Work: Cmet,cbc today  Testing/Procedures: None ordered  Follow-Up: At [facility], you and your health needs are our priority.  As part of our continuing mission to provide you with exceptional heart care, our providers are all part of one team.  This team includes your primary Cardiologist (physician) and Advanced Practice Providers or APPs (Physician Assistants and Nurse Practitioners) who all work together to provide you with the care you need, when you need it.  Your next appointment:  Keep appointment already scheduled  Tue 8/5 at 1:40 pm  Provider:  Dr.Marenda Accardi   We recommend signing up for the patient portal called "MyChart".  Sign up information is provided on this After Visit Summary.  MyChart is used to connect with patients for Virtual Visits (Telemedicine).  Patients are able to view lab/test results, encounter notes, upcoming appointments, etc.   Non-urgent messages can be sent to your provider as well.   To learn more about what you can do with MyChart, go to [email or web address].       Signed, Wendie Hamburg, MD  10/11/2023 12:48 PM    [facility]

## 2023-10-11 NOTE — Patient Instructions (Signed)
 Medication Instructions:  Start Toprol  XL 25 mg daily  Start Eliquis 5 mg twice a day  No Aspirin   No NSAIDS motrin,aleve ibuprofen  *If you need a refill on your cardiac medications before your next appointment, please call your pharmacy*  Lab Work: Cmet,cbc today  Testing/Procedures: None ordered  Follow-Up: At [facility], you and your health needs are our priority.  As part of our continuing mission to provide you with exceptional heart care, our providers are all part of one team.  This team includes your primary Cardiologist (physician) and Advanced Practice Providers or APPs (Physician Assistants and Nurse Practitioners) who all work together to provide you with the care you need, when you need it.  Your next appointment:  Keep appointment already scheduled  Tue 8/5 at 1:40 pm    Provider:  Dr.Schumann   We recommend signing up for the patient portal called "MyChart".  Sign up information is provided on this After Visit Summary.  MyChart is used to connect with patients for Virtual Visits (Telemedicine).  Patients are able to view lab/test results, encounter notes, upcoming appointments, etc.  Non-urgent messages can be sent to your provider as well.   To learn more about what you can do with MyChart, go to [email or web address].

## 2023-10-12 LAB — CBC WITH DIFFERENTIAL/PLATELET
Basophils Absolute: 0.1 10*3/uL (ref 0.0–0.2)
Basos: 1 %
EOS (ABSOLUTE): 0.2 10*3/uL (ref 0.0–0.4)
Eos: 2 %
Hematocrit: 31.2 % — ABNORMAL LOW (ref 34.0–46.6)
Hemoglobin: 9.9 g/dL — ABNORMAL LOW (ref 11.1–15.9)
Immature Grans (Abs): 0 10*3/uL (ref 0.0–0.1)
Immature Granulocytes: 0 %
Lymphocytes Absolute: 1.7 10*3/uL (ref 0.7–3.1)
Lymphs: 24 %
MCH: 27.6 pg (ref 26.6–33.0)
MCHC: 31.7 g/dL (ref 31.5–35.7)
MCV: 87 fL (ref 79–97)
Monocytes Absolute: 0.6 10*3/uL (ref 0.1–0.9)
Monocytes: 8 %
Neutrophils Absolute: 4.5 10*3/uL (ref 1.4–7.0)
Neutrophils: 65 %
Platelets: 230 10*3/uL (ref 150–450)
RBC: 3.59 x10E6/uL — ABNORMAL LOW (ref 3.77–5.28)
RDW: 13.9 % (ref 11.7–15.4)
WBC: 7 10*3/uL (ref 3.4–10.8)

## 2023-10-12 LAB — COMPREHENSIVE METABOLIC PANEL WITH GFR
ALT: 13 IU/L (ref 0–32)
AST: 23 IU/L (ref 0–40)
Albumin: 3.8 g/dL (ref 3.7–4.7)
Alkaline Phosphatase: 85 IU/L (ref 44–121)
BUN/Creatinine Ratio: 22 (ref 12–28)
BUN: 20 mg/dL (ref 8–27)
Bilirubin Total: 0.2 mg/dL (ref 0.0–1.2)
CO2: 21 mmol/L (ref 20–29)
Calcium: 9 mg/dL (ref 8.7–10.3)
Chloride: 104 mmol/L (ref 96–106)
Creatinine, Ser: 0.91 mg/dL (ref 0.57–1.00)
Globulin, Total: 2.7 g/dL (ref 1.5–4.5)
Glucose: 102 mg/dL — ABNORMAL HIGH (ref 70–99)
Potassium: 5 mmol/L (ref 3.5–5.2)
Sodium: 139 mmol/L (ref 134–144)
Total Protein: 6.5 g/dL (ref 6.0–8.5)
eGFR: 60 mL/min/{1.73_m2} (ref >59)

## 2023-10-16 ENCOUNTER — Telehealth: Payer: Self-pay | Admitting: *Deleted

## 2023-10-16 ENCOUNTER — Ambulatory Visit: Payer: Medicare HMO | Admitting: Internal Medicine

## 2023-10-16 NOTE — Telephone Encounter (Signed)
 Called left message on patient answer machine- ask patient to call office for results.

## 2023-10-16 NOTE — Telephone Encounter (Signed)
-----   Message from Wendie Hamburg sent at 10/14/2023  6:17 AM EDT ----- Normal kidney function electrolytes.  Hemoglobin is reduced but appears baseline.  Will need to monitor with starting Eliquis.  Recommend repeat CBC in 1 month

## 2023-10-17 ENCOUNTER — Telehealth: Payer: Self-pay | Admitting: *Deleted

## 2023-10-17 DIAGNOSIS — I10 Essential (primary) hypertension: Secondary | ICD-10-CM

## 2023-10-17 NOTE — Telephone Encounter (Signed)
 Called and spoke to patient and gave patient Dr. Alda Amas results and recommendations after lab work. Made patient aware that Dr. Alda Amas requested blood work, CBC in one month.Patient verbalized an understanding.

## 2023-10-18 ENCOUNTER — Ambulatory Visit (HOSPITAL_COMMUNITY)

## 2023-10-18 ENCOUNTER — Ambulatory Visit (HOSPITAL_COMMUNITY): Admission: RE | Admit: 2023-10-18 | Source: Ambulatory Visit

## 2023-10-28 ENCOUNTER — Telehealth: Payer: Self-pay | Admitting: Cardiology

## 2023-10-28 DIAGNOSIS — I10 Essential (primary) hypertension: Secondary | ICD-10-CM

## 2023-10-28 NOTE — Telephone Encounter (Signed)
 Pt c/o medication issue:  1. Name of Medication: metoprolol  succinate (TOPROL  XL) 25 MG 24 hr tablet   2. How are you currently taking this medication (dosage and times per day)? As written  3. Are you having a reaction (difficulty breathing--STAT)? no  4. What is your medication issue? Pt states medication is making her feel tired and sleepy all the time.Pulse is low (50's)

## 2023-10-28 NOTE — Telephone Encounter (Signed)
 The pt called to report that since she started the Toprol  XL 25 mg a day and she is taking it in the morning... she has been a lot of fatigue.. her HR has been in the 50's in the mornings... her BP has been:  132/71 HR 58 139/77 HR 55 140/73 HR 56 123/64 HR 62 122/63 HR 60  10/11/23 OV: Hypertension: On benazepril  20 mg daily and started Toprol -XL 25 mg daily   She will try taking it at night but will message Dr Alda Amas for his review.. the pt says she would like to try cutting it in 1/2.

## 2023-10-28 NOTE — Telephone Encounter (Signed)
 Yes that is fine to cut the metoprolol  in half

## 2023-10-29 ENCOUNTER — Telehealth: Payer: Self-pay | Admitting: Home Health

## 2023-10-29 ENCOUNTER — Ambulatory Visit: Payer: Self-pay | Admitting: Cardiology

## 2023-10-29 ENCOUNTER — Ambulatory Visit (HOSPITAL_COMMUNITY)
Admission: RE | Admit: 2023-10-29 | Discharge: 2023-10-29 | Disposition: A | Discharge status: Home / Self Care | Source: Ambulatory Visit | Attending: Cardiology | Admitting: Cardiology

## 2023-10-29 DIAGNOSIS — N189 Chronic kidney disease, unspecified: Secondary | ICD-10-CM | POA: Insufficient documentation

## 2023-10-29 DIAGNOSIS — I129 Hypertensive chronic kidney disease with stage 1 through stage 4 chronic kidney disease, or unspecified chronic kidney disease: Secondary | ICD-10-CM | POA: Diagnosis not present

## 2023-10-29 DIAGNOSIS — I5030 Unspecified diastolic (congestive) heart failure: Secondary | ICD-10-CM

## 2023-10-29 DIAGNOSIS — R002 Palpitations: Secondary | ICD-10-CM | POA: Diagnosis not present

## 2023-10-29 LAB — ECHOCARDIOGRAM COMPLETE
Area-P 1/2: 3.72 cm2
S' Lateral: 2.8 cm

## 2023-10-29 NOTE — Telephone Encounter (Signed)
 Patient called after-hours line reports that she has been having significant fatigue from taking metoprolol , she had called office earlier and wants to know what to do.  Message from Dr. Alda Amas today reviewed, informed patient that she was instructed to take 1/2 tablets of metoprolol  25 mg daily, okay to take it at night, advised the patient to continue monitor for fatigue, if remains significantly bothersome, call us  back for alternative option. She agreed.

## 2023-10-31 NOTE — Telephone Encounter (Signed)
 Spoke with pt. Pt states there is no change or improvement from last phone call. Pt stated she feels sleepy and non-productive. She stated that she "can't live this way" and wants to go back to taking just the benazepril  and a baby asprin. Pt does not want to stay on eliquis  nor metoprolol . Advised pt I would send this over to Dr.Schumann for review.

## 2023-10-31 NOTE — Telephone Encounter (Signed)
 Pt calling back due to trying half a tab of metoprolol  and still feeling the same. Requesting cb to discuss just taking benazepril  and baby aspirin 

## 2023-10-31 NOTE — Telephone Encounter (Signed)
 Recommend stopping metoprolol .  Would continue Eliquis  for now.  Recommend checking CBC to make sure she is not having anemia that is causing her to feel poorly

## 2023-11-01 NOTE — Telephone Encounter (Signed)
 Called and made patient aware of Dr. Alda Amas recommendations to stop Metoprolol  and continue eliquis . Made patient aware that Dr. Alda Amas requested CBC lab work to be done . Order placed for lab work and patient verbalized an understanding and verbalized she will have labs drawn next week.

## 2023-11-08 ENCOUNTER — Ambulatory Visit (HOSPITAL_COMMUNITY)
Admission: RE | Admit: 2023-11-08 | Discharge: 2023-11-08 | Disposition: A | Discharge status: Home / Self Care | Source: Ambulatory Visit | Attending: Cardiology | Admitting: Cardiology

## 2023-11-08 ENCOUNTER — Ambulatory Visit: Admitting: Cardiology

## 2023-11-08 DIAGNOSIS — M79605 Pain in left leg: Secondary | ICD-10-CM | POA: Insufficient documentation

## 2023-11-08 DIAGNOSIS — Z8679 Personal history of other diseases of the circulatory system: Secondary | ICD-10-CM

## 2023-11-08 DIAGNOSIS — R600 Localized edema: Secondary | ICD-10-CM | POA: Insufficient documentation

## 2023-11-08 DIAGNOSIS — M79604 Pain in right leg: Secondary | ICD-10-CM | POA: Diagnosis not present

## 2023-11-08 DIAGNOSIS — I6529 Occlusion and stenosis of unspecified carotid artery: Secondary | ICD-10-CM | POA: Diagnosis not present

## 2023-11-09 ENCOUNTER — Telehealth: Payer: Self-pay | Admitting: Cardiology

## 2023-11-09 LAB — LIPID PANEL
Chol/HDL Ratio: 2.8 ratio (ref 0.0–4.4)
Cholesterol, Total: 137 mg/dL (ref 100–199)
HDL: 49 mg/dL (ref >39)
LDL Chol Calc (NIH): 74 mg/dL (ref 0–99)
Triglycerides: 69 mg/dL (ref 0–149)
VLDL Cholesterol Cal: 14 mg/dL (ref 5–40)

## 2023-11-09 LAB — COMPREHENSIVE METABOLIC PANEL WITH GFR
ALT: 17 IU/L (ref 0–32)
AST: 26 IU/L (ref 0–40)
Albumin: 4.1 g/dL (ref 3.7–4.7)
Alkaline Phosphatase: 114 IU/L (ref 44–121)
BUN/Creatinine Ratio: 21 (ref 12–28)
BUN: 19 mg/dL (ref 8–27)
Bilirubin Total: 0.2 mg/dL (ref 0.0–1.2)
CO2: 21 mmol/L (ref 20–29)
Calcium: 9.1 mg/dL (ref 8.7–10.3)
Chloride: 97 mmol/L (ref 96–106)
Creatinine, Ser: 0.92 mg/dL (ref 0.57–1.00)
Globulin, Total: 2.9 g/dL (ref 1.5–4.5)
Glucose: 107 mg/dL — ABNORMAL HIGH (ref 70–99)
Potassium: 4.5 mmol/L (ref 3.5–5.2)
Sodium: 134 mmol/L (ref 134–144)
Total Protein: 7 g/dL (ref 6.0–8.5)
eGFR: 60 mL/min/{1.73_m2} (ref >59)

## 2023-11-09 NOTE — Telephone Encounter (Signed)
 Patient called in reporting her BP has been elevated the past couple of days. Was on benazepril  40mg  daily which was reduced to 20mg  daily to allow for start of Toprol  XL 25mg  daily. She did not tolerate the Toprol  and has stopped. BP 173/88 this morning. I advised to increase benazepril  back to 40mg  daily for now until seen back in the office with Dr. Alda Amas to decide on further medication regimen changes. She voiced understanding and thanked me for callback.

## 2023-11-11 ENCOUNTER — Other Ambulatory Visit: Payer: Self-pay | Admitting: *Deleted

## 2023-11-11 ENCOUNTER — Ambulatory Visit: Payer: Self-pay | Admitting: Cardiology

## 2023-11-11 DIAGNOSIS — I10 Essential (primary) hypertension: Secondary | ICD-10-CM

## 2023-11-11 LAB — VAS US ABI WITH/WO TBI
Left ABI: 1.19
Right ABI: 1.08

## 2023-11-11 NOTE — Progress Notes (Signed)
 Called and made patient aware that Dr. Alda Amas would like lab for CBC drawn. Lab order placed and patient verbalized an understanding.

## 2023-11-13 ENCOUNTER — Other Ambulatory Visit: Payer: Self-pay | Admitting: Family

## 2023-11-13 DIAGNOSIS — I10 Essential (primary) hypertension: Secondary | ICD-10-CM

## 2023-11-26 ENCOUNTER — Other Ambulatory Visit: Payer: Self-pay

## 2023-11-26 DIAGNOSIS — I10 Essential (primary) hypertension: Secondary | ICD-10-CM

## 2023-11-27 ENCOUNTER — Ambulatory Visit: Payer: Self-pay | Admitting: Cardiology

## 2023-11-27 LAB — CBC WITH DIFFERENTIAL/PLATELET
Basophils Absolute: 0 10*3/uL (ref 0.0–0.2)
Basos: 1 %
EOS (ABSOLUTE): 0.1 10*3/uL (ref 0.0–0.4)
Eos: 2 %
Hematocrit: 31.6 % — ABNORMAL LOW (ref 34.0–46.6)
Hemoglobin: 10.1 g/dL — ABNORMAL LOW (ref 11.1–15.9)
Immature Grans (Abs): 0 10*3/uL (ref 0.0–0.1)
Immature Granulocytes: 0 %
Lymphocytes Absolute: 1.3 10*3/uL (ref 0.7–3.1)
Lymphs: 18 %
MCH: 28 pg (ref 26.6–33.0)
MCHC: 32 g/dL (ref 31.5–35.7)
MCV: 88 fL (ref 79–97)
Monocytes Absolute: 0.5 10*3/uL (ref 0.1–0.9)
Monocytes: 7 %
Neutrophils Absolute: 5.1 10*3/uL (ref 1.4–7.0)
Neutrophils: 72 %
Platelets: 262 10*3/uL (ref 150–450)
RBC: 3.61 x10E6/uL — ABNORMAL LOW (ref 3.77–5.28)
RDW: 14.1 % (ref 11.7–15.4)
WBC: 7.1 10*3/uL (ref 3.4–10.8)

## 2023-12-26 ENCOUNTER — Ambulatory Visit: Payer: Medicare HMO | Admitting: Nurse Practitioner

## 2024-01-14 ENCOUNTER — Ambulatory Visit: Payer: Medicare HMO | Admitting: Nurse Practitioner

## 2024-01-14 ENCOUNTER — Ambulatory Visit: Admitting: Cardiology

## 2024-03-15 NOTE — Progress Notes (Unsigned)
 Cardiology Office Note:    Date:  03/15/2024   ID:  Mikayla Harrison, DOB 1931/12/24, MRN [id number]  PCP:  Justino Lonney Carte, FNP  Cardiologist:  None  Electrophysiologist:  None   Referring MD: Tonita Fallow, MD   No chief complaint on file.   History of Present Illness:    Mikayla Harrison is a 89 y.o. female with a hx of endometrial cancer, CKD stage III, hypertension, hypothyroidism, iron deficiency anemia, prediabetes, small bowel obstruction who presents for follow-up.  She was referred by Dr. Tonita for evaluation of palpitations, initially seen 08/26/2023.  She reports has been having episodes where feels like heart is racing, occurs about every day, mostly in evening.  Lasts for about 1 to 1.5 hours.  She reports some lightheadedness during episodes but denies any syncope.  Also reports some dyspnea during episodes.  She denies any chest pain.  She also reports she has been having pain in her legs when she walks.  She smoked for 5 to 6 years, about 0.25 packs/day, quit in her late 64s.  Family history includes mother had CVA in 79s and father and brothers had heart issues but she does not know details.  Zio patch x 8 days 08/2023 showed 4% A-fib burden, longest episode lasting 2.5 hours.  Average rate in A-fib was 113 bpm.  Echocardiogram 10/2023 showed EF 65 to 70%, normal RV function, dilated ascending aorta measuring 40 mm.  Since last clinic visit, she reports she is doing okay.  Continues to have occasional palpitations and shortness of breath.  Denies any chest pain.  No history of any bleeding issues.  Past Medical History:  Diagnosis Date   Anemia    Anxiety    Arthritis    CKD (chronic kidney disease) stage 3, GFR 30-59 ml/min (HCC)    Colon polyp    Diverticulosis    Endometrial cancer (HCC)    Hypertension    Hypothyroidism    Iron deficiency    Peripheral neuropathy    in bilateral legs   Plantar fasciitis    Pre-diabetes    Prediabetes    Pt denies   Small  bowel obstruction [facility])     Past Surgical History:  Procedure Laterality Date   CATARACT EXTRACTION     CYSTOSCOPY  10/18/2021   Procedure: CYSTOSCOPY;  Surgeon: Viktoria Comer SAUNDERS, MD;  Location: [facility];  Service: Gynecology;;   EYE SURGERY     MACULAR TEAR     ROBOTIC ASSISTED TOTAL HYSTERECTOMY WITH BILATERAL SALPINGO OOPHERECTOMY N/A 10/18/2021   Procedure: XI ROBOTIC ASSISTED TOTAL HYSTERECTOMY WITH BILATERAL SALPINGO OOPHORECTOMY;  Surgeon: Viktoria Comer SAUNDERS, MD;  Location: [facility];  Service: Gynecology;  Laterality: N/A;   SENTINEL NODE BIOPSY N/A 10/18/2021   Procedure: SENTINEL NODE BIOPSY;  Surgeon: Viktoria Comer SAUNDERS, MD;  Location: [facility];  Service: Gynecology;  Laterality: N/A;   Vericose vein  1965    Current Medications: No outpatient medications have been marked as taking for the 03/20/24 encounter (Appointment) with Kate Lonni LITTIE, MD.     Allergies:   Sulfa antibiotics, Codeine, Prednisone , Asa [aspirin ], Codeine, Prednisone , and Tylenol  Dulcibella.Dowdy ]   Social History   Socioeconomic History   Marital status: Widowed    Spouse name: Not on file   Number of children: Not on file   Years of education: Not on file   Highest education level: Not on file  Occupational History   Not on file  Tobacco Use  Smoking status: Former    Current packs/day: 0.00    Average packs/day: 1 pack/day for 15.0 years (15.0 ttl pk-yrs)    Types: Cigarettes    Start date: 06/11/1950    Quit date: 06/11/1965    Years since quitting: 58.8   Smokeless tobacco: Never  Vaping Use   Vaping status: Never Used  Substance and Sexual Activity   Alcohol use: No   Drug use: No   Sexual activity: Not Currently  Other Topics Concern   Not on file  Social History Narrative   ** Merged History Encounter **       Social Drivers of Health   Financial Resource Strain: Not on file  Food Insecurity: Low Risk  (10/22/2023)   Received from [facility]   Hunger Vital Sign     Within the past 12 months, you worried that your food would run out before you got money to buy more: Never true    Within the past 12 months, the food you bought just didn't last and you didn't have money to get more. : Never true  Transportation Needs: No Transportation Needs (10/22/2023)   Received from [facility]    In the past 12 months, has lack of reliable transportation kept you from medical appointments, meetings, work or from getting things needed for daily living? : No  Physical Activity: Not on file  Stress: Not on file  Social Connections: Not on file     Family History: The patient's family history includes Cancer in her maternal aunt; Colon polyps in her sister; Diabetes in her sister; Heart disease in her brother, father, and mother; Hypertension in her son; Kidney cancer in her nephew; Stroke in her brother, mother, and sister. There is no history of Breast cancer, Ovarian cancer, Endometrial cancer, Pancreatic cancer, or Prostate cancer.  ROS:   Please see the history of present illness.     All other systems reviewed and are negative.  EKGs/Labs/Other Studies Reviewed:    The following studies were reviewed today:   EKG:   08/26/2023: Normal sinus rhythm, rate 65, low voltage, poor R wave progression  Recent Labs: 04/16/2023: Magnesium 1.8; TSH 0.54 08/27/2023: BNP 167.6 11/08/2023: ALT 17; BUN 19; Creatinine, Ser 0.92; Potassium 4.5; Sodium 134 11/26/2023: Hemoglobin 10.1; Platelets 262  Recent Lipid Panel    Component Value Date/Time   CHOL 137 11/08/2023 1539   TRIG 69 11/08/2023 1539   HDL 49 11/08/2023 1539   CHOLHDL 2.8 11/08/2023 1539   CHOLHDL 2.7 04/16/2023 1110   VLDL 14 11/07/2016 0426   LDLCALC 74 11/08/2023 1539   LDLCALC 69 04/16/2023 1110    Physical Exam:    VS:  LMP  (LMP Unknown)     Wt Readings from Last 3 Encounters:  10/11/23 143 lb 3.2 oz (65 kg)  08/26/23 145 lb 9.6 oz (66 kg)  05/03/23 140 lb 12.8 oz (63.9 kg)      GEN:  Well nourished, well developed in no acute distress HEENT: Normal NECK: No JVD; No carotid bruits LYMPHATICS: No lymphadenopathy CARDIAC: RRR, no murmurs, rubs, gallops RESPIRATORY:  Clear to auscultation without rales, wheezing or rhonchi  ABDOMEN: Soft, non-tender, non-distended MUSCULOSKELETAL: Mild lower extremity edema; No deformity  SKIN: Warm and dry NEUROLOGIC:  Alert and oriented x 3 PSYCHIATRIC:  Normal affect   ASSESSMENT:    No diagnosis found.   PLAN:    Atrial fibrillation: She reported palpitations, Zio patch x 8 days 08/2023 showed  4% A-fib burden, longest episode lasting 2.5 hours.  Average rate in A-fib was 113 bpm.  CHA2DS2-VASc 4 (hypertension, age x 2, female).  Echocardiogram 10/2023 showed EF 65 to 70%, normal RV function, dilated ascending aorta measuring 40 mm. - Continue Eliquis  5 mg twice daily - Continue Toprol -XL 25 mg daily  Hypertension: On benazepril  20 mg daily and Toprol -XL 25 mg daily  Lower extremity edema: Mild bilateral lower extremity edema on exam.  BNP, CMET unremarkable.  Echocardiogram unremarkable as above.  Lower extremity duplex last year was negative for DVT.  Amlodipine  use could be contributing, was discontinued.  Suspect venous insufficiency.  Recommend use of compression stockings  Carotid artery stenosis: Mild bilateral carotid artery stenosis on carotid duplex in 2018.  Updated carotid duplex 11/08/2023 showed near normal carotid arteries.  Leg pain: Normal ABIs 11/08/2023  RTC in 3 months***   Medication Adjustments/Labs and Tests Ordered: Current medicines are reviewed at length with the patient today.  Concerns regarding medicines are outlined above.  No orders of the defined types were placed in this encounter.  No orders of the defined types were placed in this encounter.   There are no Patient Instructions on file for this visit.   Signed, Lonni LITTIE Nanas, MD  03/15/2024 3:48 PM    [facility]

## 2024-03-17 NOTE — Telephone Encounter (Signed)
 Pt request 40 mg benazepril . Rx sent.

## 2024-03-20 ENCOUNTER — Encounter: Payer: Self-pay | Admitting: Cardiology

## 2024-03-20 ENCOUNTER — Ambulatory Visit: Attending: Cardiology | Admitting: Cardiology

## 2024-03-20 VITALS — BP 116/72 | HR 64 | Ht 66.0 in | Wt 141.0 lb

## 2024-03-20 DIAGNOSIS — I480 Paroxysmal atrial fibrillation: Secondary | ICD-10-CM

## 2024-03-20 DIAGNOSIS — I10 Essential (primary) hypertension: Secondary | ICD-10-CM | POA: Diagnosis not present

## 2024-03-20 DIAGNOSIS — M79605 Pain in left leg: Secondary | ICD-10-CM

## 2024-03-20 DIAGNOSIS — R600 Localized edema: Secondary | ICD-10-CM | POA: Diagnosis not present

## 2024-03-20 DIAGNOSIS — M79604 Pain in right leg: Secondary | ICD-10-CM

## 2024-03-20 DIAGNOSIS — Z79899 Other long term (current) drug therapy: Secondary | ICD-10-CM

## 2024-03-20 MED ORDER — APIXABAN 5 MG PO TABS: 5.0000 mg | ORAL_TABLET | Freq: Two times a day (BID) | ORAL | 3 refills | Status: DC

## 2024-03-20 NOTE — Patient Instructions (Signed)
 Medication Instructions:  Stop, Aspirin  Restart Eliquis  5mg  twica a day *If you need a refill on your cardiac medications before your next appointment, please call your pharmacy*  Lab Work: none If you have labs (blood work) drawn today and your tests are completely normal, you will receive your results only by: MyChart Message (if you have MyChart) OR A paper copy in the mail If you have any lab test that is abnormal or we need to change your treatment, we will call you to review the results.  Testing/Procedures: none  Follow-Up: At [facility], you and your health needs are our priority.  As part of our continuing mission to provide you with exceptional heart care, our providers are all part of one team.  This team includes your primary Cardiologist (physician) and Advanced Practice Providers or APPs (Physician Assistants and Nurse Practitioners) who all work together to provide you with the care you need, when you need it.  Your next appointment:   6 months  Provider:   Dr. KATE  We recommend signing up for the patient portal called MyChart.  Sign up information is provided on this After Visit Summary.  MyChart is used to connect with patients for Virtual Visits (Telemedicine).  Patients are able to view lab/test results, encounter notes, upcoming appointments, etc.  Non-urgent messages can be sent to your provider as well.   To learn more about what you can do with MyChart, go to [email or web address].   Other Instructions NOE

## 2024-04-15 ENCOUNTER — Telehealth: Payer: Self-pay | Admitting: Cardiology

## 2024-04-15 NOTE — Telephone Encounter (Signed)
 Pt c/o medication issue:  1. Name of Medication: apixaban  (ELIQUIS ) 5 MG TABS tablet   2. How are you currently taking this medication (dosage and times per day)? As written  3. Are you having a reaction (difficulty breathing--STAT)? No  4. What is your medication issue? Pt states this medication is causing her to itch all over.

## 2024-04-15 NOTE — Telephone Encounter (Signed)
 Has been going on for little while and getting worse.  She feels it started about the first week of starting Eliquis .  Itching is all over her back, shoulders, hips.  She tried antihistamine cough syrup topically- and it helped.  She doesn't tolerate oral antihistamines.    Prescribed 10/10 by Dr. Kate for afib.  She is aware I will send to Dr. Kate and pharmD for further recommendations.  She stated she would be glad to take aspirin  instead.

## 2024-04-16 NOTE — Telephone Encounter (Signed)
 We can stop Eliquis  and try switching to Xarelto 20 mg daily

## 2024-04-20 ENCOUNTER — Other Ambulatory Visit (HOSPITAL_COMMUNITY): Payer: Self-pay

## 2024-04-20 ENCOUNTER — Other Ambulatory Visit: Payer: Self-pay

## 2024-04-20 MED ORDER — DABIGATRAN ETEXILATE MESYLATE 150 MG PO CAPS
150.0000 mg | ORAL_CAPSULE | Freq: Two times a day (BID) | ORAL | 0 refills | Status: DC
  Filled 2024-04-20: qty 60, 30d supply, fill #0

## 2024-04-20 MED ORDER — DABIGATRAN ETEXILATE MESYLATE 150 MG PO CAPS: 150.0000 mg | ORAL_CAPSULE | Freq: Two times a day (BID) | ORAL | 1 refills | Status: DC

## 2024-04-20 NOTE — Telephone Encounter (Signed)
 Spoke with patient.  Reviewed Dr. Kate suggestion about trying Xarelto instead.  Explained why we can't use ASA as treatment for AFIB.  She was leery about using Xarelto, so I offered dabigatran (different MOA).  She is willing to try that.  Cannot determine cost on Q1 Medicare site (down while updating for 2026).  Will run test claim in pharmacy downstairs and determine coverage/cost

## 2024-04-21 ENCOUNTER — Telehealth: Payer: Self-pay | Admitting: Cardiology

## 2024-04-21 NOTE — Telephone Encounter (Signed)
 Pharmacy called to clarify if pt needs to take both medications Pradaxa 150 mg and Eliquis . Please advise

## 2024-04-21 NOTE — Telephone Encounter (Signed)
 No should only take pradaxa and stop eliquis 

## 2024-04-28 ENCOUNTER — Telehealth: Payer: Self-pay | Admitting: Cardiology

## 2024-04-28 NOTE — Telephone Encounter (Signed)
 Spoke with patient. Pt inquiring about taking Gaviscon as needed for OTC heartburn treatment after taking the Pradaxa. Advised patient to start taking the Pradaxa with food also.

## 2024-04-28 NOTE — Telephone Encounter (Signed)
  Pt c/o medication issue:  1. Name of Medication:   dabigatran (PRADAXA) 150 MG CAPS capsule    2. How are you currently taking this medication (dosage and times per day)?   Take 1 capsule (150 mg total) by mouth 2 (two) times daily.    3. Are you having a reaction (difficulty breathing--STAT)? No   4. What is your medication issue? Patient took the medication yesterday and she didn't know she have to take it with food. She had heart burn and indigestion. She wants to ask what she can take to help her with heart burn and indigestion if in case it happen again.

## 2024-04-29 ENCOUNTER — Encounter: Payer: Medicare HMO | Admitting: Internal Medicine

## 2024-04-29 NOTE — Telephone Encounter (Signed)
 Attempted to reach patient to ensure she is taking the Gaviscon with a full glass of water . LMTCB.

## 2024-05-01 NOTE — Progress Notes (Signed)
 [address] - AMBULATORY [facility] [address] [place] [address]    Subjective: Mikayla Harrison is a 89 y.o.female who presents to the clinic today for lower leg edema.  HPI:  Mikayla Harrison presents today with concerns for lower leg edema and aching at night. She has mentioned this in visits with myself and her cardiologist several times. Lower extremity duplex last year was neg for DVT. Amlodipine  was thought to contribute to sx, it was discontinued earlier this year. Cardiology suspected this was venous insufficiency and recommended she wear compression stockings.   Today she is worried about redness and increased edema in her lower left ankle and calf. She states it hurts and feels tight. She is on her feet on and off through the day. She does not wear compression stockings because she cannot feel her feet when they are on. She also has developed nerve pain in her legs when she sleeps. This has been ongoing for the past year. Her last PCP prescribed gabapentin  for this but she never tried it. She was nervous about side effects.   Wt Readings from Last 3 Encounters:  05/01/24 64.9 kg (143 lb)  03/02/24 63 kg (139 lb)  01/27/24 64 kg (141 lb 3.2 oz)    Medical History[1] Surgical History[2] Family History[3] Social History[4]  Health Maintenance  Topic Date Due  . Bone Density Scan  Never done  . ZOSTER VACCINE (1 of 2) Never done  . Adult RSV (50+ Years or Pregnancy) (1 - 1-dose 75+ series) 03/02/2025 (Originally 02/25/2009)  . COVID-19 Vaccine (2 - 2025-26 season) 03/02/2025 (Originally 02/10/2024)  . Comprehensive Annual Visit  03/02/2025  . Diabetes Screening  03/02/2025  . Depression Screening  03/02/2025  . DTaP/Tdap/Td Vaccines (3 - Tdap) 10/11/2027  . Medicare Annual Wellness Visit:  Medicare Advantage  Completed  . Influenza Vaccine  Completed  . Pneumococcal Vaccine for  Ages 50+  Completed  . HIB Vaccines  Aged Out  . Hepatitis B Vaccines  Aged Out  . IPV Vaccines  Aged Out  . Hepatitis A Vaccines  Aged Out  . Meningococcal Conjugate (ACWY) Vaccine  Aged Out  . Rotavirus Vaccines  Aged Out  . HPV Vaccines  Aged Out  . Meningococcal B Vaccine  Aged Out   Immunization History  Administered Date(s) Administered  . DT 03/22/2015  . Influenza, high-dose, trivalent, PF 03/16/2014, 03/22/2015, 02/15/2016, 04/23/2017, 04/30/2018, 04/08/2019, 06/19/2021, 03/17/2023, 03/02/2024  . Influenza, split virus, trivalent, preservative 03/04/2013  . Pneumococcal Conjugate Vaccine 20-Valent (PREVNAR-20) 6 wks+ 03/02/2024  . Pneumococcal Polysaccharide Vaccine, 23 Valent (PNEUMOVAX-23) 2Y+ 03/16/2014  . TD (Adult), 2 Lf Tetanus Toxoid, Preservative Free 10/10/2017     Review of systems:    All other pertinent positive and negative aspects of the ROS are detailed in the above subjective/HPI section.    Physical Exam:  BP 110/60 (BP Location: Left arm, Patient Position: Sitting)   Pulse 75   Temp 97 F (36.1 C) (Temporal)   Ht 1.535 m (5' 0.43)   Wt 64.9 kg (143 lb)   SpO2 95%   Breastfeeding No   BMI 27.53 kg/m  Body mass index is 27.53 kg/m.  Physical Examination:   Physical Exam Cardiovascular:     Rate and Rhythm: Normal rate and regular rhythm.     Heart sounds: Normal heart sounds.  Pulmonary:     Effort: Pulmonary effort is  normal.     Breath sounds: Normal breath sounds.  Musculoskeletal:     Right lower leg: 2+ Edema present.     Left lower leg: 3+ Edema present.  Skin:    General: Skin is warm and dry.  Neurological:     Mental Status: She is alert.        Asssement Delma:  Mikayla Harrison was seen today for leg pain.  Diagnoses and all orders for this visit:  Cellulitis of left lower extremity -     cephALEXin  (KEFLEX ) 500 mg capsule; Take 1 capsule (500 mg total) by mouth 2 (two) times a day for 10 days. -     furosemide (LASIX)  20 mg tablet; Take 1 tablet (20 mg total) by mouth daily for 3 days.  Venous insufficiency -     furosemide (LASIX) 20 mg tablet; Take 1 tablet (20 mg total) by mouth daily for 3 days.  Neuropathy -     gabapentin  (NEURONTIN ) 100 mg capsule; Take 1 capsule (100 mg total) by mouth at bedtime.  LRE concerning for development of cellulitis. Will diurese her for 3 days and start her keflex . She will be unavailable to follow up next week. Indications to go to urgent care were discussed. In addition, she will shop for compression stockings that do not cover her feet. This should help with her balance and venous insufficiency. She will also trial low dose gabapentin  at bedtime for neuropathy.   Requested Prescriptions   Signed Prescriptions Disp Refills  . cephALEXin  (KEFLEX ) 500 mg capsule 20 capsule 0    Sig: Take 1 capsule (500 mg total) by mouth 2 (two) times a day for 10 days.  . furosemide (LASIX) 20 mg tablet 3 tablet 0    Sig: Take 1 tablet (20 mg total) by mouth daily for 3 days.  . gabapentin  (NEURONTIN ) 100 mg capsule 30 capsule 0    Sig: Take 1 capsule (100 mg total) by mouth at bedtime.    Return in about 2 weeks (around 05/15/2024) for Recheck.  Electronically signed by: Lonney Canny, FNP-C 05/01/2024 4:27 PM        [1] Past Medical History: Diagnosis Date  . Abnormal glucose 10/10/2017  . CKD (chronic kidney disease) stage 3, GFR 30-59 ml/min (CMD) 03/16/2014  . CREST syndrome    (CMD) 02/12/2018  . Disease of thyroid  gland   . Diverticulosis 10/22/2023  . Earlobe pain 08/04/2020  . Edema of left lower extremity 09/08/2021  . Endometrial cancer (CMD) 09/08/2021  . Essential hypertension 01/14/2012  . History of TIA (transient ischemic attack) 11/06/2016  . History of uterine cancer   . Hyperlipidemia, mixed 10/05/2013  . Hypertension   . Hypothyroidism 01/14/2012  . Normocytic anemia 06/10/2018   Followed by Dr. Odean  Anemia due to CKD, if she gets below 10  may benefit from EPO    . Overweight (BMI 25.0-29.9) 03/22/2015  . Prediabetes 10/22/2023  . Raynaud phenomenon 08/01/2015  . 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 Vitamin D  deficiency 10/05/2013  [2] Past Surgical History: Procedure Laterality Date  . LYMPH NODE BIOPSY    . TOTAL VAGINAL HYSTERECTOMY    [3] No family history on file. [4] Social History Socioeconomic History  . Marital status: Widowed  Tobacco Use  . Smoking status: Never  . Smokeless tobacco: Never  Vaping Use  . Vaping status: Never Used  Substance and Sexual Activity  . Alcohol use: Not Currently  . Drug use: Never  . Sexual activity:  Not Currently   Social Drivers of Health   Food Insecurity: Low Risk  (10/22/2023)   Food vital sign   . Within the past 12 months, you worried that your food would run out before you got money to buy more: Never true   . Within the past 12 months, the food you bought just didn't last and you didn't have money to get more: Never true  Transportation Needs: No Transportation Needs (10/22/2023)   Transportation   . In the past 12 months, has lack of reliable transportation kept you from medical appointments, meetings, work or from getting things needed for daily living? : No  Social Connections: Moderately Integrated (05/01/2024)   Social Connection and Isolation Panel   . Frequency of Communication with Friends and Family: More than three times a week   . Frequency of Social Gatherings with Friends and Family: More than three times a week   . Attends Religious Services: More than 4 times per year   . Active Member of Clubs or Organizations: Yes   . Attends Banker Meetings: 1 to 4 times per year   . Marital Status: Widowed  Safety: Low Risk  (05/01/2024)   Safety   . How often does anyone, including family and friends, physically hurt you?: Never   . How often does anyone, including family and friends, insult or talk down to you?: Never   . How often does anyone,  including family and friends, threaten you with harm?: Never   . How often does anyone, including family and friends, scream or curse at you?: Never  Living Situation: Low Risk  (10/22/2023)   Living Situation   . What is your living situation today?: I have a steady place to live   . Think about the place you live. Do you have problems with any of the following? Choose all that apply:: None/None on this list

## 2024-05-04 ENCOUNTER — Emergency Department (HOSPITAL_COMMUNITY)

## 2024-05-04 ENCOUNTER — Encounter (HOSPITAL_COMMUNITY): Payer: Self-pay

## 2024-05-04 ENCOUNTER — Emergency Department (HOSPITAL_COMMUNITY)
Admission: EM | Admit: 2024-05-04 | Discharge: 2024-05-04 | Disposition: A | Discharge status: Home / Self Care | Attending: Emergency Medicine | Admitting: Emergency Medicine

## 2024-05-04 ENCOUNTER — Other Ambulatory Visit: Payer: Self-pay

## 2024-05-04 DIAGNOSIS — N189 Chronic kidney disease, unspecified: Secondary | ICD-10-CM | POA: Diagnosis not present

## 2024-05-04 DIAGNOSIS — R42 Dizziness and giddiness: Secondary | ICD-10-CM

## 2024-05-04 DIAGNOSIS — Z79899 Other long term (current) drug therapy: Secondary | ICD-10-CM | POA: Insufficient documentation

## 2024-05-04 DIAGNOSIS — Z7901 Long term (current) use of anticoagulants: Secondary | ICD-10-CM | POA: Diagnosis not present

## 2024-05-04 DIAGNOSIS — I4891 Unspecified atrial fibrillation: Secondary | ICD-10-CM | POA: Diagnosis not present

## 2024-05-04 DIAGNOSIS — D649 Anemia, unspecified: Secondary | ICD-10-CM | POA: Diagnosis not present

## 2024-05-04 DIAGNOSIS — I129 Hypertensive chronic kidney disease with stage 1 through stage 4 chronic kidney disease, or unspecified chronic kidney disease: Secondary | ICD-10-CM | POA: Insufficient documentation

## 2024-05-04 DIAGNOSIS — B379 Candidiasis, unspecified: Secondary | ICD-10-CM

## 2024-05-04 LAB — URINALYSIS, ROUTINE W REFLEX MICROSCOPIC
Bilirubin Urine: NEGATIVE
Glucose, UA: NEGATIVE mg/dL
Hgb urine dipstick: NEGATIVE
Ketones, ur: NEGATIVE mg/dL
Nitrite: NEGATIVE
Protein, ur: NEGATIVE mg/dL
Specific Gravity, Urine: 1.008 (ref 1.005–1.030)
pH: 7 (ref 5.0–8.0)

## 2024-05-04 LAB — COMPREHENSIVE METABOLIC PANEL WITH GFR
ALT: 15 U/L (ref 0–44)
AST: 24 U/L (ref 15–41)
Albumin: 3.1 g/dL — ABNORMAL LOW (ref 3.5–5.0)
Alkaline Phosphatase: 53 U/L (ref 38–126)
Anion gap: 10 (ref 5–15)
BUN: 18 mg/dL (ref 8–23)
CO2: 25 mmol/L (ref 22–32)
Calcium: 8.5 mg/dL — ABNORMAL LOW (ref 8.9–10.3)
Chloride: 102 mmol/L (ref 98–111)
Creatinine, Ser: 1.03 mg/dL — ABNORMAL HIGH (ref 0.44–1.00)
GFR, Estimated: 52 mL/min — ABNORMAL LOW (ref >60)
Glucose, Bld: 147 mg/dL — ABNORMAL HIGH (ref 70–99)
Potassium: 3.9 mmol/L (ref 3.5–5.1)
Sodium: 137 mmol/L (ref 135–145)
Total Bilirubin: 0.5 mg/dL (ref 0.0–1.2)
Total Protein: 6.4 g/dL — ABNORMAL LOW (ref 6.5–8.1)

## 2024-05-04 LAB — TROPONIN I (HIGH SENSITIVITY)
Troponin I (High Sensitivity): 22 ng/L — ABNORMAL HIGH (ref <18)
Troponin I (High Sensitivity): 27 ng/L — ABNORMAL HIGH (ref <18)

## 2024-05-04 LAB — CBC
HCT: 33.2 % — ABNORMAL LOW (ref 36.0–46.0)
Hemoglobin: 10.3 g/dL — ABNORMAL LOW (ref 12.0–15.0)
MCH: 27.8 pg (ref 26.0–34.0)
MCHC: 31 g/dL (ref 30.0–36.0)
MCV: 89.5 fL (ref 80.0–100.0)
Platelets: 246 K/uL (ref 150–400)
RBC: 3.71 MIL/uL — ABNORMAL LOW (ref 3.87–5.11)
RDW: 13.6 % (ref 11.5–15.5)
WBC: 7.2 K/uL (ref 4.0–10.5)
nRBC: 0 % (ref 0.0–0.2)

## 2024-05-04 MED ORDER — IOHEXOL 350 MG/ML SOLN
75.0000 mL | Freq: Once | INTRAVENOUS | Status: AC | PRN
  Administered 2024-05-04: 75 mL via INTRAVENOUS

## 2024-05-04 MED ORDER — SODIUM CHLORIDE 0.9 % IV BOLUS
500.0000 mL | Freq: Once | INTRAVENOUS | Status: AC
  Administered 2024-05-04: 500 mL via INTRAVENOUS

## 2024-05-04 MED ORDER — ETOMIDATE 2 MG/ML IV SOLN: 10.0000 mg | Freq: Once | INTRAVENOUS | Status: DC

## 2024-05-04 MED ORDER — FLUCONAZOLE 150 MG PO TABS
150.0000 mg | ORAL_TABLET | Freq: Once | ORAL | Status: AC
  Administered 2024-05-04: 150 mg via ORAL
  Filled 2024-05-04: qty 1

## 2024-05-04 MED ORDER — DILTIAZEM HCL ER COATED BEADS 120 MG PO CP24: 120.0000 mg | ORAL_CAPSULE | Freq: Every day | ORAL | 0 refills | Status: DC

## 2024-05-04 MED ORDER — ONDANSETRON HCL 4 MG/2ML IJ SOLN: 4.0000 mg | Freq: Once | INTRAMUSCULAR | Status: DC

## 2024-05-04 MED ORDER — FENTANYL CITRATE (PF) 50 MCG/ML IJ SOSY: 25.0000 ug | PREFILLED_SYRINGE | INTRAMUSCULAR | Status: DC | PRN

## 2024-05-04 NOTE — ED Provider Notes (Signed)
 [name] [facility] AT [facility] Provider Note   CSN: [id number] Arrival date & time: 05/04/24  1400     Patient presents with: Dizziness   Mikayla Harrison is a 89 y.o. female.    Dizziness    89 year old female with medical history significant for HTN, SBO, iron deficiency, diverticulosis, CKD, anxiety, atrial fibrillation on Pradaxa , not currently on any rate control medications outpatient who presents to the emergency department with lightheadedness and dizziness.  Patient also has a history of vertigo and has meclizine  prescribed.  She denies any chest pain or shortness of breath.  She had multiple episodes of lightheadedness earlier today.  No focal neurologic deficit, no facial droop, no focal weakness or numbness.  No speech abnormalities.  She felt room spinning dizziness while at the grocery store earlier today and felt as if she was going to pass out prompting her presentation to the emergency department via EMS.  On arrival, the patient was found to be in atrial fibrillation with RVR.  She subsequently spontaneously cardioverted.  Prior to Admission medications   Medication Sig Start Date End Date Taking? Authorizing Provider  diltiazem  (CARDIZEM  CD) 120 MG 24 hr capsule Take 1 capsule (120 mg total) by mouth daily. 05/04/24  Yes Jerrol Agent, MD  Ascorbic Acid (VITAMIN C PO) Take 2,000 mg by mouth daily.    [provider]  benazepril  (LOTENSIN ) 20 MG tablet Take 1 tablet (20 mg total) by mouth daily. 08/26/23   Kate Lonni CROME, MD  benazepril  (LOTENSIN ) 20 MG tablet Take 20 mg by mouth daily. Patient not taking: Reported on 03/20/2024    [provider]  benzonatate  (TESSALON ) 100 MG capsule Take 1 capsule (100 mg total) by mouth every 8 (eight) hours as needed for cough. Patient not taking: Reported on 03/20/2024 09/09/23   Mound, Haley E, FNP  Cholecalciferol (VITAMIN D3) 125 MCG (5000 UT) TABS Take 5,000 Units by mouth daily.     [provider]  clorazepate  (TRANXENE ) 7.5 MG tablet Take 1/2 - 1 tablet 2 - 3 x /day ONLY if needed for Anxiety Attack &  limit to 5 days /week to avoid Addiction & Dementia Patient taking differently: Take 7.5 mg by mouth daily as needed for anxiety or sleep. Take 1/2 - 1 tablet 2 - 3 x /day ONLY if needed for Anxiety Attack &  limit to 5 days /week to avoid Addiction & Dementia 08/03/21   Tonita Fallow, MD  Cyanocobalamin  (VITAMIN B-12) 500 MCG SUBL Take 2,500 mcg by mouth daily.    [provider]  dabigatran  (PRADAXA ) 150 MG CAPS capsule Take 1 capsule (150 mg total) by mouth 2 (two) times daily. 04/20/24   Kate Lonni CROME, MD  levothyroxine  (SYNTHROID ) 25 MCG tablet Take 25 mcg by mouth daily before breakfast.    [provider]  Magnesium 250 MG TABS Take 1 tablet by mouth daily.    [provider]  meclizine  (ANTIVERT ) 25 MG tablet Take 1/2 to 1  3 x  /day as needed for Dizziness / Vertigo 02/14/22   Tonita Fallow, MD  zinc gluconate 50 MG tablet Take 50 mg by mouth daily.    [provider]    Allergies: Sulfa antibiotics, Codeine, Prednisone , Aspirin , Codeine, Prednisone , and Tylenol  [acetaminophen ]    Review of Systems  Neurological:  Positive for dizziness and light-headedness.  All other systems reviewed and are negative.   Updated Vital Signs BP (!) 122/59   Pulse 70  Temp 98 F (36.7 C) (Oral)   Resp (!) 23   Ht 5' (1.524 m)   Wt 64.4 kg   LMP  (LMP Unknown)   SpO2 100%   BMI 27.73 kg/m   Physical Exam Vitals and nursing note reviewed.  Constitutional:      General: She is not in acute distress.    Appearance: She is well-developed.  HENT:     Head: Normocephalic and atraumatic.  Eyes:     Conjunctiva/sclera: Conjunctivae normal.  Cardiovascular:     Rate and Rhythm: Tachycardia present. Rhythm irregular.  Pulmonary:     Effort: Pulmonary effort is normal. No respiratory distress.     Breath sounds:  Normal breath sounds.  Abdominal:     Palpations: Abdomen is soft.     Tenderness: There is no abdominal tenderness.  Musculoskeletal:        General: No swelling.     Cervical back: Neck supple.  Skin:    General: Skin is warm and dry.     Capillary Refill: Capillary refill takes less than 2 seconds.  Neurological:     Mental Status: She is alert.     Comments: MENTAL STATUS EXAM:    Orientation: Alert and oriented to person, place and time. Memory: Cooperative, follows commands well.  Language: Speech is clear and language is normal.   CRANIAL NERVES:    CN 2 (Optic): Visual fields intact to confrontation.  CN 3,4,6 (EOM): Pupils equal and reactive to light. Full extraocular eye movement without nystagmus.  CN 5 (Trigeminal): Facial sensation is normal, no weakness of masticatory muscles.  CN 7 (Facial): No facial weakness or asymmetry.  CN 8 (Auditory): Auditory acuity grossly normal.  CN 9,10 (Glossophar): The uvula is midline, the palate elevates symmetrically.  CN 11 (spinal access): Normal sternocleidomastoid and trapezius strength.  CN 12 (Hypoglossal): The tongue is midline. No atrophy or fasciculations.SABRA   MOTOR:  Muscle Strength: 5/5RUE, 5/5LUE, 5/5RLE, 5/5LLE.   COORDINATION:   Intact finger-to-nose, no tremor.   SENSATION:   Intact to light touch all four extremities.  GAIT: Gait not assessed   Psychiatric:        Mood and Affect: Mood normal.     (all labs ordered are listed, but only abnormal results are displayed) Labs Reviewed  COMPREHENSIVE METABOLIC PANEL WITH GFR - Abnormal; Notable for the following components:      Result Value   Glucose, Bld 147 (*)    Creatinine, Ser 1.03 (*)    Calcium 8.5 (*)    Total Protein 6.4 (*)    Albumin 3.1 (*)    GFR, Estimated 52 (*)    All other components within normal limits  CBC - Abnormal; Notable for the following components:   RBC 3.71 (*)    Hemoglobin 10.3 (*)    HCT 33.2 (*)    All other components  within normal limits  URINALYSIS, ROUTINE W REFLEX MICROSCOPIC - Abnormal; Notable for the following components:   Leukocytes,Ua TRACE (*)    Bacteria, UA RARE (*)    All other components within normal limits  TROPONIN I (HIGH SENSITIVITY) - Abnormal; Notable for the following components:   Troponin I (High Sensitivity) 22 (*)    All other components within normal limits  TROPONIN I (HIGH SENSITIVITY) - Abnormal; Notable for the following components:   Troponin I (High Sensitivity) 27 (*)    All other components within normal limits    EKG: EKG Interpretation Date/Time:  Monday  May 04 2024 16:27:45 EST Ventricular Rate:  76 PR Interval:  140 QRS Duration:  124 QT Interval:  397 QTC Calculation: 447 R Axis:   -2  Text Interpretation: Sinus rhythm Nonspecific intraventricular conduction delay Consider anterior infarct Confirmed by Jerrol Agent ([id number]) on 05/04/2024 4:44:38 PM  Radiology: CT ANGIO HEAD NECK W WO CM Result Date: 05/04/2024 EXAM: CT HEAD WITHOUT CTA HEAD AND NECK WITH AND WITHOUT 05/04/2024 05:56:03 PM TECHNIQUE: CTA of the head and neck was performed with and without the administration of 75 mL of iohexol  (OMNIPAQUE ) 350 MG/ML injection. Noncontrast CT of the head with reconstructed 2-D images are also provided for review. Multiplanar 2D and/or 3D reformatted images are provided for review. Automated exposure control, iterative reconstruction, and/or weight based adjustment of the mA/kV was utilized to reduce the radiation dose to as low as reasonably achievable. COMPARISON: 11/07/2016 CLINICAL HISTORY: Vertigo, central. FINDINGS: CT HEAD: BRAIN AND VENTRICLES: Mild chronic microvascular ischemic changes. Mild age related volume loss. 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 No acute intracranial hemorrhage. No edema, mass effect, or midline shift. The ventricles are unremarkable. No extra-axial fluid collection. No evidence of acute infarct. No hydrocephalus.  ORBITS: Bilateral lens replacements. SINUSES AND MASTOIDS: Mucosal thickening in the left maxillary sinus. CTA NECK: AORTIC ARCH AND ARCH VESSELS: Mild atherosclerosis of the visualized aortic arch. No dissection or arterial injury. No significant stenosis of the brachiocephalic or subclavian arteries. CERVICAL CAROTID ARTERIES: The right carotid artery is patent from the origin to the skull base with no hemodynamically significant stenosis. The left carotid artery is patent from the origin to the skull base with no hemodynamically significant stenosis. No dissection or arterial injury. CERVICAL VERTEBRAL ARTERIES: The vertebral arteries are patent from the origins to the vertebrobasilar confluence. Atherosclerosis adjacent to the left vertebral artery origin without significant stenosis. Tortuosity of the left V1 segment. No dissection or arterial injury. LUNGS AND MEDIASTINUM: Unremarkable. SOFT TISSUES: Calcifications of the bilateral palatine tonsils likely reflecting tonsilloliths. Subcentimeter nodules in the thyroid . There is fatty atrophy of the bilateral parotid glands. The submandibular glands are not well visualized and are also likely atrophic. BONES: Degenerative changes in the visualized spine with disc space narrowing greatest at C6-C7. Edentulous maxilla and mandible. Chronic appearing bilateral nasal bone deformities. CTA HEAD: ANTERIOR CIRCULATION: The intracranial internal carotid arteries are patent bilaterally. Atherosclerosis of the bilateral carotid arteries is present. Carotid siphons are slightly more pronounced on the right. There is mild stenosis of the right cavernous ICA. The middle cerebral arteries are patent bilaterally. The anterior cerebral arteries are patent bilaterally. No aneurysm. POSTERIOR CIRCULATION: No significant stenosis of the posterior cerebral arteries. No significant stenosis of the basilar artery. No significant stenosis of the vertebral arteries. No aneurysm. OTHER:  No dural venous sinus thrombosis on this non-dedicated study. IMPRESSION: 1. No acute intracranial abnormality. 2. No large vessel occlusion. 3. Atherosclerosis as above. Mild stenosis of the right cavernous ICA. No hemodynamically significant extracranial carotid stenosis. 4. Mild chronic microvascular ischemic changes and mild age-related volume loss. Electronically signed by: Donnice Mania MD 05/04/2024 07:33 PM EST RP Workstation: [id number]   DG Chest Portable 1 View Result Date: 05/04/2024 CLINICAL DATA:  dizzy, LH, near syncope EXAM: PORTABLE CHEST - 1 VIEW COMPARISON:  07/28/2015 FINDINGS: No focal airspace consolidation, pleural effusion, or pneumothorax. No cardiomegaly.Tortuous aorta with aortic atherosclerosis.No acute fracture or destructive lesion. Multilevel thoracic osteophytosis. IMPRESSION: No acute cardiopulmonary abnormality. Electronically Signed   By: Rogelia Carlean HERO.D.  On: 05/04/2024 16:42     Procedures   Medications Ordered in the ED  sodium chloride  0.9 % bolus 500 mL (0 mLs Intravenous Stopped 05/04/24 1716)  iohexol  (OMNIPAQUE ) 350 MG/ML injection 75 mL (75 mLs Intravenous Contrast Given 05/04/24 1756)                                    Medical Decision Making Amount and/or Complexity of Data Reviewed Labs: ordered. Radiology: ordered.  Risk Prescription drug management.    89 year old female with medical history significant for HTN, SBO, iron deficiency, diverticulosis, CKD, anxiety, atrial fibrillation on Pradaxa , not currently on any rate control medications outpatient who presents to the emergency department with lightheadedness and dizziness.  Patient also has a history of vertigo and has meclizine  prescribed.  She denies any chest pain or shortness of breath.  She had multiple episodes of lightheadedness earlier today.  No focal neurologic deficit, no facial droop, no focal weakness or numbness.  No speech abnormalities.  She felt room spinning  dizziness while at the grocery store earlier today and felt as if she was going to pass out prompting her presentation to the emergency department via EMS.  On arrival, the patient was found to be in atrial fibrillation with RVR.  She subsequently spontaneously cardioverted.  On arrival, the patient was afebrile, tachycardic, irregular rate noted on exam and cardiac telemetry with atrial fibrillation noted on telemetry and EKG, rate 112, hemodynamically stable BP 124/73, saturating 100% on room air.  While in the exam room, I saw the patient's heart rate go up to as high as 128.  She is not currently on any rate control medication.  She is anticoagulated on Pradaxa .  Suspect likely symptoms associated with patient's atrial fibrillation given her heart rate as high as 120s while at rest lying on the exam stretcher.  On exam, the patient was neurologically intact.  She denies any focal deficit, no active room spinning dizziness.  Hints exam not indicated as patient is asymptomatic at this time.  Workup initiated to include EKG, labs, chest x-ray and CT imaging.  EKG: Atrial fibrillation with RVR, ventricular rate 113, nonspecific ST changes noted.  No STEMI.  CXR: No acute disease.  Spoke with Dr. Jeffrie who agreed with plan for ER cardioversion and also recommended that we start the patient on Cardizem  120 mg CD and have the patient follow-up with the A-fib clinic.  Repeat EKG after spontaneous conversion to NSR: Sinus rhythm, ventricular rate 76, no STEMI.  Labs: Cardiac troponin 22, repeat pending, CBC without a leukocytosis, mild anemia to 10.3, CMP without significant electrolyte abnormality, normal renal function. UA is pending.  Repeat troponin flat at 27.  Patient without chest pain or shortness of breath.  Neurologically intact.  Low concern for posterior CVA.  In the setting the patient's symptoms, CTA head and neck was obtained.  Urinalysis pending.  Plan to follow-up results of UA and CT  imaging, reassess the patient.  Patient remains spontaneously converted to NSR.  Scription for Cardizem  120 mg was provided.  Plan for likely discharge pending reassessment and results of diagnostic testing.  Signout given to Dr. Tegeler at [phone].     Final diagnoses:  None    ED Discharge Orders          Ordered    diltiazem  (CARDIZEM  CD) 120 MG 24 hr capsule  Daily  05/04/24 1533               Jerrol Agent, MD 05/04/24 [phone]

## 2024-05-04 NOTE — ED Provider Notes (Signed)
 5:29 PM Care assumed from Dr. Jerrol.  At time of transfer of care, patient is awaiting on CTA head and neck and laboratory testing and reassessment.  Patient reportedly had room spinning dizziness but does have a history of vertigo.  Patient was found to be in A-fib with RVR and prior to cardioversion here, patient converted back to sinus rhythm.  Patient was given some fluids and blood pressure has improved.  Anticipate reassessment after workup to determine disposition.  She is feeling well and workup is reassuring, anticipate discharge home for outpatient follow-up.  Patient will wait on the CT result and urinalysis results.  Anticipate discharge home per previous team.  9:34 PM CTA did not show acute stroke or acute intracranial abnormality.  Urinalysis shows improvement with no nitrites and only small leuks and bacteria.  Patient still had some mild dysuria but she appears to still be on Keflex  and pharmacy recommended not changing that yet.  They did recommend treating the yeast in the urine with a dose of fluconazole  here to treat it.  Patient agrees with this plan and we will discharge for outpatient follow-up with her PCP and cardiology team and previous team called in the prescription for diltiazem  that cardiology who recommended tonight.  Patient will be discharged for outpatient follow-up and she agrees with this plan.   Clinical Impression: 1. Atrial fibrillation with RVR (HCC)   2. Dizziness     Disposition: Discharge  Condition: Good  I have discussed the results, Dx and Tx plan with the pt(& family if present). He/she/they expressed understanding and agree(s) with the plan. Discharge instructions discussed at great length. Strict return precautions discussed and pt &/or family have verbalized understanding of the instructions. No further questions at time of discharge.    New Prescriptions   DILTIAZEM  (CARDIZEM  CD) 120 MG 24 HR CAPSULE    Take 1 capsule (120 mg total) by  mouth daily.    Follow Up: Atrial Fib Clinic at [facility] [place]. [facility] [address], [address] [place]  [id number] [phone] Schedule an appointment as soon as possible for a visit       Joandry Slagter, Lonni PARAS, MD 05/04/24 2135

## 2024-05-04 NOTE — ED Notes (Signed)
 Pt ambulated down hall to bathroom and back to room with standby assist. Pt tolerated well.

## 2024-05-04 NOTE — ED Triage Notes (Signed)
 Pt was at [employer] and felt dizzy. Pt was in afib. No LOC. Denies CP and SHOB. EMS gave 500 cc of NS. Axox4. Denies numbness/tingling.

## 2024-05-04 NOTE — Discharge Instructions (Addendum)
 He presented in atrial fibrillation with rapid ventricular response and spontaneously converted to normal sinus rhythm.  Cardiology was consulted and it was recommended that you start diltiazem .  Follow-up with the atrial fibrillation clinic.  The remainder of your testing was overall reassuring.

## 2024-05-05 ENCOUNTER — Telehealth: Payer: Self-pay | Admitting: Cardiology

## 2024-05-05 NOTE — Telephone Encounter (Signed)
 STAT if patient feels like he/she is going to faint   Are you dizzy, lightheaded, or faint now? No   Have you passed out? No IF YES MOVE TO .SYNCOPECVD  Do you have any other symptoms? No   Have you checked your HR and BP (record if available)? No    Pt would like a c/b do discuss hospital visit and the medication that was prescribed to her please advise

## 2024-05-05 NOTE — Telephone Encounter (Signed)
 Patient reports she has been experiencing neuropathy for which her PCP prescribed Gabapentin . Patient took one dose Sunday evening and states she woke up feeling dizzy. She went grocery shopping and became so dizzy she couldn't stand, retail workers called 911 for her and she was taken to ED.  She was found to be in A-Fib with RVR, she states it was caused by the Gabapentin .  Patient was discharged with instructions to follow-up with A-Fib clinic and start diltiazem .  Patient is very hesitant to take diltiazem . She states she has been doing well on what Dr. Kate has prescribed her and she will not take this new medication unless he recommends it.  Will forward to Dr. Kate to review.

## 2024-05-05 NOTE — Telephone Encounter (Signed)
 Yes I think would be reasonable to try diltiazem .  Looks like she has follow-up appointment in A-fib clinic on 12/4

## 2024-05-06 NOTE — Telephone Encounter (Signed)
 Shared Dr. Alvan reply/recommendation with patient, she states she is not going to take the diltiazem . She feels fine with her current medications and does not want to add a new one, especially going into the holiday.  She states she respects Dr. Alvan opinion, but she will not take diltiazem . She will follow-up with Afib Clinic next week on 12/4. Patient expressed appreciation for follow-up.

## 2024-05-10 ENCOUNTER — Telehealth: Payer: Self-pay

## 2024-05-10 NOTE — Telephone Encounter (Signed)
 Patient reached out to cardiology emergency line reporting high BP and HR. Her self-reported BP is 180/97 and HR 113. She is also feeling lightheaded. She has history of hypertension and AF. She notes no focal neurologic deficits and believes her vision is fine. I recommended the patient present to the ED for hypertensive urgency vs emergency and be evaluated. The patient agreed and said they would call 911.  Donley LOISE Devonshire, MD, PhD Cardiology

## 2024-05-11 NOTE — Telephone Encounter (Signed)
 Called patient and made her aware to stop Eliquis  and continue Pradaxa . Per pt she verbalized she stopped Eliquis  2 weeks ago and  she started 11/12 and verbalized an understanding.

## 2024-05-14 ENCOUNTER — Ambulatory Visit (HOSPITAL_COMMUNITY): Admitting: Physician Assistant

## 2024-05-15 ENCOUNTER — Ambulatory Visit (HOSPITAL_COMMUNITY): Admitting: Physician Assistant

## 2024-05-15 NOTE — Progress Notes (Incomplete)
 Primary Care Physician: Justino Lonney Carte, FNP Primary Cardiologist: None Electrophysiologist: None  Referring Physician: Dr. Kate Raoul Mikayla Harrison is a 89 y.o. 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  The patient was initially diagnosed with atrial fibrillation after wearing ZIO monitor for complaint of palpitations in 08/2023.  Zio patch was worn for 8 days and showed a 4% AF burden with longest episode being 2-1/2 hours.  She underwent a 2D echo on 10/2023 that showed EF of 65 to 70% with dilated ascending aorta (40 mm).  She reported some dyspnea and lightheadedness during episodes but denies any syncope.  She was started on Toprol -XL 25 mg daily but felt poorly with fatigue and was advised to cut in half but continued to have symptoms and later discontinued.  She was advised to continue her Eliquis  however she discontinued and started ASA 81 mg due to itching and rash.  She was last seen by Dr. Barnetta on 03/20/2024 and was advised to discontinue ASA 81 mg and resume Eliquis  which was later changed to Pradaxa .  She developed some GI upset with Pradaxa  initially and was advised to take with food.    She was seen in the ED on 05/04/2024 with complaint of dizziness and was found to be in AF with RVR and rates of high 120s.  She presented via EMS after feeling the sensation of passing out.  She has a history of vertigo and was currently on meclizine .  She was started on Cardizem  IV and spontaneously converted.  She was discharged with a prescription for Cardizem  120 mg daily.   Patient presents today for follow up for atrial fibrillation. ***  Today, she denies symptoms of ***palpitations, chest pain, shortness of breath, orthopnea, PND, lower extremity edema, dizziness,  presyncope, syncope, snoring, daytime somnolence, bleeding, or neurologic sequela. The patient is tolerating medications without difficulties and is otherwise without complaint today.    Atrial Fibrillation Risk Factors:  she {Action; does/does not:19097} have symptoms or diagnosis of sleep apnea. she does not have a history of rheumatic fever. she {Action; does/does not:19097} have a history of alcohol use. The patient does not have a history of early familial atrial fibrillation or other arrhythmias.  Atrial Fibrillation Management history:  Previous antiarrhythmic drugs: None Previous cardioversions: None Previous ablations: None Anticoagulation history: Eliquis , discontinued due to itching and now on Pradaxa  150 mg twice daily  ROS- All systems are reviewed and negative except as per the HPI above.  Past Medical History:  Diagnosis Date   Anemia    Anxiety    Arthritis    CKD (chronic kidney disease) stage 3, GFR 30-59 ml/min (HCC)    Colon polyp    Diverticulosis    Endometrial cancer (HCC)    Hypertension    Hypothyroidism    Iron deficiency    Peripheral neuropathy    in bilateral legs   Plantar fasciitis    Pre-diabetes    Prediabetes    Pt denies   Small bowel obstruction (HCC)     Current Outpatient Medications  Medication Sig Dispense Refill   Ascorbic Acid (VITAMIN C PO) Take 2,000 mg by mouth daily.     benazepril  (LOTENSIN ) 20 MG tablet Take 1 tablet (20 mg total) by mouth daily. 90 tablet 3   benazepril  (  LOTENSIN ) 20 MG tablet Take 20 mg by mouth daily. (Patient not taking: Reported on 03/20/2024)     benzonatate  (TESSALON ) 100 MG capsule Take 1 capsule (100 mg total) by mouth every 8 (eight) hours as needed for cough. (Patient not taking: Reported on 03/20/2024) 21 capsule 0   Cholecalciferol (VITAMIN D3) 125 MCG (5000 UT) TABS Take 5,000 Units by mouth daily.     clorazepate  (TRANXENE ) 7.5 MG tablet Take 1/2 - 1 tablet 2 - 3 x /day ONLY if needed for  Anxiety Attack &  limit to 5 days /week to avoid Addiction & Dementia (Patient taking differently: Take 7.5 mg by mouth daily as needed for anxiety or sleep. Take 1/2 - 1 tablet 2 - 3 x /day ONLY if needed for Anxiety Attack &  limit to 5 days /week to avoid Addiction & Dementia) 30 tablet 0   Cyanocobalamin  (VITAMIN B-12) 500 MCG SUBL Take 2,500 mcg by mouth daily.     dabigatran  (PRADAXA ) 150 MG CAPS capsule Take 1 capsule (150 mg total) by mouth 2 (two) times daily. 60 capsule 1   diltiazem  (CARDIZEM  CD) 120 MG 24 hr capsule Take 1 capsule (120 mg total) by mouth daily. 30 capsule 0   levothyroxine  (SYNTHROID ) 25 MCG tablet Take 25 mcg by mouth daily before breakfast.     Magnesium 250 MG TABS Take 1 tablet by mouth daily.     meclizine  (ANTIVERT ) 25 MG tablet Take 1/2 to 1  3 x  /day as needed for Dizziness / Vertigo 90 tablet 3   zinc gluconate 50 MG tablet Take 50 mg by mouth daily.     No current facility-administered medications for this visit.    Physical Exam: LMP  (LMP Unknown)   GEN: Well nourished, well developed in no acute distress NECK: No JVD; No carotid bruits CARDIAC: {EPRHYTHM:28826}, no murmurs, rubs, gallops RESPIRATORY:  Clear to auscultation without rales, wheezing or rhonchi  ABDOMEN: Soft, non-tender, non-distended EXTREMITIES:  No edema; No deformity   Wt Readings from Last 3 Encounters:  05/04/24 64.4 kg  03/20/24 64 kg  10/11/23 65 kg     EKG today demonstrates:   EKG Interpretation Date/Time:    Ventricular Rate:    PR Interval:    QRS Duration:    QT Interval:    QTC Calculation:   R Axis:      Text Interpretation:          Echo 10/29/2023 demonstrated: 1. Left ventricular ejection fraction, by estimation, is 65 to 70%. Left  ventricular ejection fraction by 3D volume is 68 %. The left ventricle has  normal function. The left ventricle has no regional wall motion  abnormalities. Left ventricular diastolic   parameters are consistent with  Grade I diastolic dysfunction (impaired  relaxation).   2. Right ventricular systolic function is normal. The right ventricular  size is normal.   3. The mitral valve is normal in structure. No evidence of mitral valve  regurgitation.   4. The aortic valve is tricuspid. Aortic valve regurgitation is not  visualized.   5. Aortic dilatation noted. There is mild dilatation of the ascending  aorta, measuring 40 mm.   6. The inferior vena cava is normal in size with greater than 50%  respiratory variability, suggesting right atrial pressure of 3 mmHg.    CHA2DS2-VASc Score =    The patient's score is based upon:   {Click here to calculate score.  REFRESH note before signing. :1}  ASSESSMENT AND PLAN: Paroxysmal Atrial Fibrillation (ICD10:  I48.0) The patient's CHA2DS2-VASc score is  , indicating a  % annual risk of stroke.  *** - Recent admission to ED with AF with RVR and conversion with IV Cardizem  and started on p.o. Cardizem  120 mg - Today patient is*** - Continue Pradaxa  150 mg twice daily  Essential HTN:   Aortic dilation: - 2D echo completed 10/2023 showing mild aortic dilation of 40 mm  Hypothyroidism: -     Signed,  Wyn Raddle, Jackee Shove, NP    05/15/2024 7:31 AM    Assessment and Plan    Follow up ***  {Are you ordering a CV Procedure (e.g. stress test, cath, DCCV, TEE, etc)?   Press F2        :[id number]}    Jackee Wyn, NP-C [facility] [address] [place], [place] [address] [phone]

## 2024-05-21 ENCOUNTER — Telehealth: Payer: Self-pay | Admitting: Pharmacy Technician

## 2024-05-21 ENCOUNTER — Other Ambulatory Visit (HOSPITAL_COMMUNITY): Payer: Self-pay

## 2024-05-21 ENCOUNTER — Encounter (HOSPITAL_COMMUNITY): Payer: Self-pay | Admitting: Physician Assistant

## 2024-05-21 ENCOUNTER — Ambulatory Visit (HOSPITAL_COMMUNITY)
Admission: RE | Admit: 2024-05-21 | Discharge: 2024-05-21 | Attending: Physician Assistant | Admitting: Physician Assistant

## 2024-05-21 VITALS — BP 140/80 | HR 65 | Ht 60.0 in | Wt 141.4 lb

## 2024-05-21 DIAGNOSIS — D6869 Other thrombophilia: Secondary | ICD-10-CM | POA: Diagnosis not present

## 2024-05-21 DIAGNOSIS — I700 Atherosclerosis of aorta: Secondary | ICD-10-CM

## 2024-05-21 DIAGNOSIS — I10 Essential (primary) hypertension: Secondary | ICD-10-CM | POA: Diagnosis not present

## 2024-05-21 DIAGNOSIS — I4891 Unspecified atrial fibrillation: Secondary | ICD-10-CM | POA: Diagnosis not present

## 2024-05-21 DIAGNOSIS — I77810 Thoracic aortic ectasia: Secondary | ICD-10-CM

## 2024-05-21 DIAGNOSIS — I480 Paroxysmal atrial fibrillation: Secondary | ICD-10-CM | POA: Diagnosis not present

## 2024-05-21 NOTE — Progress Notes (Addendum)
 Primary Care Physician: Justino Lonney Carte, FNP Primary Cardiologist: None Electrophysiologist: None  Referring Physician: Dr. Kate Raoul LITTIE Mikayla Harrison is a 89 y.o. 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   She was seen in the ED on 05/04/2024 with complaint of dizziness and was found to be in AF with RVR and rates of high 120s. She presented via EMS after feeling the sensation of passing out. She has a history of vertigo and was currently on meclizine . She was started on Cardizem  IV and spontaneously converted. She was discharged with a prescription for Cardizem  120 mg daily and CTA showed no evidence of acute stroke. She contacted our office on 05/05/2024 following her ED visit and reported that she had not started her Cardizem  and wish not to take any additional medications at this time.  She contacted the after-hours emergency line on 05/10/2024 with complaint of dizziness with BP of 180/97 and heart rate of 113.  She was advised to go to the ED for further evaluation at that time.   Patient presents today for follow up for atrial fibrillation.  She reports since her ED visit experiencing 1 episode of tachycardia that resolved spontaneously and with relaxation.  She has been compliant with her Pradaxa  and denies any missed doses.  During today's visit we discussed medication options for ongoing rate control as well as rhythm control.  She was prescribed Cardizem  at her most recent ED visit however did not start medication due to risk of side effects.  She is not a candidate for ablation due to her age and is not able to take class Ic agents due to history of CAD.  He has a creatinine clearance of 26.08 and is eligible for the lowest dose of Tikosyn.  We  discussed alternatives such as amiodarone and Multaq. She is concerned with adding a new medication to her regimen. We discussed through a shared decision trying Cardizem  as needed.  He was also advised to contact us  if she has any further episodes of tachycardia that do not resolve with as needed medication.   Atrial Fibrillation Risk Factors: She does have a history of rheumatic fever she does not have a history of alcohol use. The patient does not have a history of early familial atrial fibrillation or other arrhythmias.  Atrial Fibrillation Management history:  Previous antiarrhythmic drugs: None Previous cardioversions: None Previous ablations: None Anticoagulation history: Eliquis , discontinued due to itching and now on Pradaxa  150 mg twice daily  ROS- All systems are reviewed and negative except as per the HPI above.  Past Medical History:  Diagnosis Date   Anemia    Anxiety    Arthritis    CKD (chronic kidney disease) stage 3, GFR 30-59 ml/min (HCC)    Colon polyp    Diverticulosis    Endometrial cancer (HCC)    Hypertension    Hypothyroidism    Iron deficiency    Peripheral neuropathy    in bilateral legs   Plantar fasciitis    Pre-diabetes    Prediabetes    Pt denies   Small bowel obstruction (HCC)     Current Outpatient Medications  Medication Sig Dispense Refill   Ascorbic Acid (VITAMIN C PO) Take 2,000 mg by mouth daily.     benazepril  (LOTENSIN ) 40  MG tablet Take 40 mg by mouth daily.     Cholecalciferol (VITAMIN D3) 125 MCG (5000 UT) TABS Take 5,000 Units by mouth daily.     clorazepate  (TRANXENE ) 7.5 MG tablet Take 1/2 - 1 tablet 2 - 3 x /day ONLY if needed for Anxiety Attack &  limit to 5 days /week to avoid Addiction & Dementia 30 tablet 0   Cyanocobalamin  (VITAMIN B-12) 500 MCG SUBL Take 2,500 mcg by mouth daily.     dabigatran  (PRADAXA ) 150 MG CAPS capsule Take 1 capsule (150 mg total) by mouth 2 (two) times daily. 60 capsule 1   diltiazem  (CARDIZEM )  30 MG tablet Take 1 tablet every 4 hours AS NEEDED for AFIB heart rate >100 as long as top BP >100. 45 tablet 1   ferrous sulfate 325 (65 FE) MG EC tablet Take 325 mg by mouth every morning.     Magnesium 250 MG TABS Take 1 tablet by mouth daily.     meclizine  (ANTIVERT ) 25 MG tablet Take 1/2 to 1  3 x  /day as needed for Dizziness / Vertigo 90 tablet 3   SYNTHROID  150 MCG tablet Take 150 mcg by mouth every morning.     zinc gluconate 50 MG tablet Take 50 mg by mouth daily.     No current facility-administered medications for this encounter.    Physical Exam: BP (!) 140/80   Pulse 65   Ht 5' (1.524 m)   Wt 64.1 kg   LMP  (LMP Unknown)   BMI 27.62 kg/m   GEN: Well nourished, well developed in no acute distress NECK: No JVD; No carotid bruits CARDIAC: Regular rate and rhythm, no murmurs, rubs, gallops RESPIRATORY:  Clear to auscultation without rales, wheezing or rhonchi  ABDOMEN: Soft, non-tender, non-distended EXTREMITIES:  No edema; No deformity   Wt Readings from Last 3 Encounters:  05/21/24 64.1 kg  05/04/24 64.4 kg  03/20/24 64 kg     EKG today demonstrates:   EKG Interpretation Date/Time:  Thursday May 21 2024 11:48:45 EST Ventricular Rate:  65 PR Interval:  136 QRS Duration:  72 QT Interval:  396 QTC Calculation: 411 R Axis:   63  Text Interpretation: Normal sinus rhythm Low voltage QRS Borderline ECG When compared with ECG of 04-May-2024 16:27, PREVIOUS ECG IS PRESENT Confirmed by Wyn Manus [phone]) on 05/21/2024 12:34:42 PM        Echo 10/29/2023 demonstrated: 1. Left ventricular ejection fraction, by estimation, is 65 to 70%. Left  ventricular ejection fraction by 3D volume is 68 %. The left ventricle has  normal function. The left ventricle has no regional wall motion  abnormalities. Left ventricular diastolic   parameters are consistent with Grade I diastolic dysfunction (impaired  relaxation).   2. Right ventricular systolic function is normal.  The right ventricular  size is normal.   3. The mitral valve is normal in structure. No evidence of mitral valve  regurgitation.   4. The aortic valve is tricuspid. Aortic valve regurgitation is not  visualized.   5. Aortic dilatation noted. There is mild dilatation of the ascending  aorta, measuring 40 mm.   6. The inferior vena cava is normal in size with greater than 50%  respiratory variability, suggesting right atrial pressure of 3 mmHg.    CHA2DS2-VASc Score = 5  The patient's score is based upon: CHF History: 0 HTN History: 1 Diabetes History: 0 Stroke History: 0 Vascular Disease History: 1 Age Score: 2 Gender Score: 1  ASSESSMENT AND PLAN: Paroxysmal Atrial Fibrillation (ICD10:  I48.0) The patient's CHA2DS2-VASc score is 5, indicating a 7.2% annual risk of stroke.   - Recent admission to ED with AF with RVR and conversion with IV Cardizem  and started on p.o. Cardizem  120 mg but never started due to risk of side effects - Today patient is sinus rhythm and will discontinue Cardizem  120 mg and try Cardizem  30 mg as needed - Continue Pradaxa  150 mg twice daily -CrCl: 37 mL/min -Qt:447 Wt: 64.4  - Discussed additional options for rhythm management such as amiodarone and Multaq if she is not able to achieve rate control with as needed Cardizem .  Secondary Hypercoagulable State (ICD10:  D68.69) The patient is at significant risk for stroke/thromboembolism based upon her CHA2DS2-VASc Score of 5.  Continue Dabigatran  (Pradaxa ).   Essential HTN: BP well controlled. Continue current antihypertensive regimen.   Aortic dilation: - 2D echo completed 10/2023 showing mild aortic dilation of 40 mm  Hypothyroidism: -Stable TSH - Continue current treatment plan as prescribed  CAD/aortic atherosclerosis: - Denies chest pain or shortness of breath  Follow up PRN  Jackee Alberts, NP-C [facility] [address] [place], [place] [address] [phone]

## 2024-05-21 NOTE — Telephone Encounter (Signed)
 Pharmacy Patient Advocate Encounter  Received notification from [employer] that Prior Authorization for dabigatran  has been APPROVED from 05/21/24 to 06/10/25   PA #/Case ID/Reference #: [id number]

## 2024-05-21 NOTE — Patient Instructions (Signed)
Cardizem 30mg  -- Take 1 tablet every 4 hours AS NEEDED for AFIB heart rate >100 as long as top BP >100.

## 2024-05-21 NOTE — Telephone Encounter (Signed)
° °  Pharmacy Patient Advocate Encounter   Received notification from CoverMyMeds that prior authorization for dabigatran  is required/requested.   Insurance verification completed.   The patient is insured through [employer].   Per test claim: PA required; PA submitted to above mentioned insurance via Latent Key/confirmation #/EOC [id number] Status is pending

## 2024-05-25 NOTE — Progress Notes (Signed)
 "  [address] - AMBULATORY [facility] [address] [place] [address]    Subjective: Mikayla Harrison is a 89 y.o.female who presents to the clinic today for ED follow up.   HPI:   Vascular insufficiency- continues to have bilateral +3 edema. She was treated for left lower extremity cellulitis and diuresed last month. Lasix did help swelling in her feet. She continues to have swelling, usually at the end of the day. She states her left foot will ache when it becomes too swollen. Redness and erythema have resolved since taking keflex .   She was seen in the ED last week after experiencing dizziness and pre syncope at the grocery store. She was brought to [facility] ED and found to be in [facility]. She was treated with diltazem and spontaneously converted. She was followed up by the AF clinic and prescribed diltiazem  PRN for elevated HR. She took one today for HR in low 100's. She states she feels better after taking. HR is 75 bpm and irregular in the office today. She is compliant on pradaxa . Coincidentally, she took a gabapentin  the night before her AF episode and states she will not take it again.   Medical History[1] Surgical History[2] Family History[3] Social History[4]  Health Maintenance  Topic Date Due   Bone Density Scan  Never done   ZOSTER VACCINE (1 of 2) Never done   Adult RSV (50+ Years or Pregnancy) (1 - 1-dose 75+ series) 03/02/2025 (Originally 02/25/2009)   COVID-19 Vaccine (2 - 2025-26 season) 03/02/2025 (Originally 02/10/2024)   Comprehensive Annual Visit  03/02/2025   Diabetes Screening  03/02/2025   Depression Screening  05/01/2025   DTaP/Tdap/Td Vaccines (3 - Tdap) 10/11/2027   Medicare Annual Wellness Visit:  Medicare Advantage  Completed   Influenza Vaccine  Completed   Pneumococcal Vaccine for Ages 50+  Completed   HPV Vaccines (No Doses Required) Completed    HIB Vaccines  Aged Out   Hepatitis B Vaccines  Aged Out   IPV Vaccines  Aged Out   Hepatitis A Vaccines  Aged Out   Meningococcal Conjugate (ACWY) Vaccine  Aged Out   Rotavirus Vaccines  Aged Out   Meningococcal B Vaccine  Aged Out   Immunization History  Administered Date(s) Administered   DT 03/22/2015   Influenza, high-dose, trivalent, PF 03/16/2014, 03/22/2015, 02/15/2016, 04/23/2017, 04/30/2018, 04/08/2019, 06/19/2021, 03/17/2023, 03/02/2024   Influenza, split virus, trivalent, preservative 03/04/2013   Pneumococcal Conjugate Vaccine 20-Valent (PREVNAR-20) 6 wks+ 03/02/2024   Pneumococcal Polysaccharide Vaccine, 23 Valent (PNEUMOVAX-23) 2Y+ 03/16/2014   TD (Adult), 2 Lf Tetanus Toxoid, Preservative Free 10/10/2017     Review of systems:    All other pertinent positive and negative aspects of the ROS are detailed in the above subjective/HPI section.    Physical Exam:  BP 124/60 (BP Location: Left arm, Patient Position: Sitting)   Pulse 68   Temp 97 F (36.1 C) (Temporal)   Ht 1.535 m (5' 0.43)   Wt 65.7 kg (144 lb 12.8 oz)   SpO2 95%   Breastfeeding No   BMI 27.88 kg/m  Body mass index is 27.88 kg/m.  Physical Examination:   Physical Exam Constitutional:      Appearance: Normal appearance.  Cardiovascular:     Rate and Rhythm: Normal rate and regular rhythm.     Heart sounds: Normal heart sounds.  Pulmonary:     Effort: Pulmonary effort  is normal.     Breath sounds: Normal breath sounds.  Musculoskeletal:     Right lower leg: 3+ Edema present.     Left lower leg: 3+ Edema present.  Skin:    General: Skin is warm and dry.     Capillary Refill: Capillary refill takes less than 2 seconds.  Neurological:     Mental Status: She is alert and oriented to person, place, and time.  Psychiatric:        Mood and Affect: Mood normal.        Behavior: Behavior normal.        Asssement Delma:  Mikayla Harrison was seen today for  cellulitis.  Diagnoses and all orders for this visit:  Venous insufficiency -     furosemide (LASIX) 20 mg tablet; Take 1 tablet (20 mg total) by mouth daily as needed (ankle swelling).  Longstanding persistent atrial fibrillation    (CMD)  We discussed non medicinal interventions to manage her chronic venous insufficiency including wearing compression stockings, elevating extremities, wearing supportive shoes. We discussed taking low dose, 10mg  lasix every day for management. She would rather take PRN since she would like to manage w/out medication as much as possible. Continue to follow with cardiology as scheduled.   Requested Prescriptions   Signed Prescriptions Disp Refills   furosemide (LASIX) 20 mg tablet 30 tablet 0    Sig: Take 1 tablet (20 mg total) by mouth daily as needed (ankle swelling).    Return for Next scheduled follow up.  Electronically signed by: Lonney Canny, FNP-C 05/25/2024 4:13 PM        [1] Past Medical History: Diagnosis Date   Abnormal glucose 10/10/2017   CKD (chronic kidney disease) stage 3, GFR 30-59 ml/min (CMD) 03/16/2014   CREST syndrome    (CMD) 02/12/2018   Disease of thyroid  gland    Diverticulosis 10/22/2023   Earlobe pain 08/04/2020   Edema of left lower extremity 09/08/2021   Endometrial cancer (CMD) 09/08/2021   Essential hypertension 01/14/2012   History of TIA (transient ischemic attack) 11/06/2016   History of uterine cancer    Hyperlipidemia, mixed 10/05/2013   Hypertension    Hypothyroidism 01/14/2012   Normocytic anemia 06/10/2018   Followed by Dr. Odean  Anemia due to CKD, if she gets below 10 may benefit from EPO     Overweight (BMI 25.0-29.9) 03/22/2015   Prediabetes 10/22/2023   Raynaud phenomenon 08/01/2015   Unifocal PVCs 11/02/2014   Vitamin D  deficiency 10/05/2013  [2] Past Surgical History: Procedure Laterality Date   LYMPH NODE BIOPSY     TOTAL VAGINAL HYSTERECTOMY    [3] No  family history on file. [4] Social History Socioeconomic History   Marital status: Widowed  Tobacco Use   Smoking status: Never   Smokeless tobacco: Never  Vaping Use   Vaping status: Never Used  Substance and Sexual Activity   Alcohol use: Not Currently   Drug use: Never   Sexual activity: Not Currently   Social Drivers of Health   Living Situation: Low Risk (05/25/2024)   Living Situation    What is your living situation today?: I have a steady place to live    Think about the place you live. Do you have problems with any of the following? Choose all that apply:: None/None on this list  Food Insecurity: Low Risk (05/25/2024)   Food vital sign    Within the past 12 months, you worried that your food would run out before you  got money to buy more: Never true    Within the past 12 months, the food you bought just didn't last and you didn't have money to get more: Never true  Transportation Needs: No Transportation Needs (05/25/2024)   Transportation    In the past 12 months, has lack of reliable transportation kept you from medical appointments, meetings, work or from getting things needed for daily living? : No  Utilities: Low Risk (05/25/2024)   Utilities    In the past 12 months has the electric, gas, oil, or water  company threatened to shut off services in your home? : No  Safety: Low Risk (05/25/2024)   Safety    How often does anyone, including family and friends, physically hurt you?: Never    How often does anyone, including family and friends, insult or talk down to you?: Never    How often does anyone, including family and friends, threaten you with harm?: Never    How often does anyone, including family and friends, scream or curse at you?: Never  Alcohol Screening: Not At Risk (03/02/2024)   Alcohol    Audit C Alcohol risk score: 0  Tobacco Use: Low Risk (05/25/2024)   Patient History    Smoking Tobacco Use: Never    Smokeless Tobacco Use:  Never  Recent Concern: Tobacco Use - Medium Risk (05/21/2024)   Received from [facility]   Patient History    Smoking Tobacco Use: Former    Smokeless Tobacco Use: Never  Depression: Not At Risk (05/25/2024)   PHQ-2    PHQ-2 Score: 0  Social Connections: Moderately Integrated (05/01/2024)   Social Connection and Isolation Panel    Frequency of Communication with Friends and Family: More than three times a week    Frequency of Social Gatherings with Friends and Family: More than three times a week    Attends Religious Services: More than 4 times per year    Active Member of [employer] or Organizations: Yes    Attends Banker Meetings: 1 to 4 times per year    Marital Status: Widowed  Physicist, Medical Strain: Low Risk (05/25/2024)   Overall Financial Resource Strain (CARDIA)    Difficulty of Paying Living Expenses: Not hard at all  Recent Concern: Financial Resource Strain - Medium Risk (05/25/2024)   Overall Financial Resource Strain (CARDIA)    Difficulty of Paying Living Expenses: Somewhat hard  "

## 2024-06-19 ENCOUNTER — Telehealth: Payer: Self-pay | Admitting: Home Health

## 2024-06-19 MED ORDER — DABIGATRAN ETEXILATE MESYLATE 150 MG PO CAPS: 150.0000 mg | ORAL_CAPSULE | Freq: Two times a day (BID) | ORAL | 1 refills | Status: AC

## 2024-06-19 NOTE — Telephone Encounter (Signed)
 Patient called after hour line, states she will ran out Dabigatran  Monday morning and needs refill. She has been tolerating it well without any bleeding. Refill sent to [facility] pharmacy.

## 2024-07-15 ENCOUNTER — Telehealth: Payer: Self-pay | Admitting: Cardiology

## 2024-07-15 NOTE — Telephone Encounter (Signed)
 Spoke with [employer]. Generic is covered and will be $5 a month. Informed pt of this, verbalizes understanding.

## 2024-07-15 NOTE — Telephone Encounter (Signed)
 Pt c/o medication issue:  1. Name of Medication: dabigatran  (PRADAXA ) 150 MG CAPS capsule   2. How are you currently taking this medication (dosage and times per day)? As written  3. Are you having a reaction (difficulty breathing--STAT)? no  4. What is your medication issue? Per pt her insurance no longer covers medication. Please advise
